# Patient Record
Sex: Male | Born: 1950 | Race: White | Hispanic: No | Marital: Married | Smoking: Never smoker
Health system: Southern US, Community
[De-identification: ages and names within clinical notes are randomized; demographics above are authoritative.]

## PROBLEM LIST (undated history)

## (undated) DIAGNOSIS — Z860101 Personal history of adenomatous and serrated colon polyps: Secondary | ICD-10-CM

## (undated) DIAGNOSIS — D696 Thrombocytopenia, unspecified: Secondary | ICD-10-CM

## (undated) DIAGNOSIS — I10 Essential (primary) hypertension: Secondary | ICD-10-CM

## (undated) DIAGNOSIS — T7840XA Allergy, unspecified, initial encounter: Secondary | ICD-10-CM

## (undated) DIAGNOSIS — Z9289 Personal history of other medical treatment: Secondary | ICD-10-CM

## (undated) DIAGNOSIS — H269 Unspecified cataract: Secondary | ICD-10-CM

## (undated) DIAGNOSIS — R200 Anesthesia of skin: Secondary | ICD-10-CM

## (undated) DIAGNOSIS — Z8673 Personal history of transient ischemic attack (TIA), and cerebral infarction without residual deficits: Secondary | ICD-10-CM

## (undated) DIAGNOSIS — I219 Acute myocardial infarction, unspecified: Secondary | ICD-10-CM

## (undated) DIAGNOSIS — M1990 Unspecified osteoarthritis, unspecified site: Secondary | ICD-10-CM

## (undated) DIAGNOSIS — I2510 Atherosclerotic heart disease of native coronary artery without angina pectoris: Secondary | ICD-10-CM

## (undated) DIAGNOSIS — N390 Urinary tract infection, site not specified: Secondary | ICD-10-CM

## (undated) DIAGNOSIS — Z86010 Personal history of colonic polyps: Secondary | ICD-10-CM

## (undated) DIAGNOSIS — F1010 Alcohol abuse, uncomplicated: Secondary | ICD-10-CM

## (undated) DIAGNOSIS — I452 Bifascicular block: Secondary | ICD-10-CM

## (undated) DIAGNOSIS — L719 Rosacea, unspecified: Secondary | ICD-10-CM

## (undated) DIAGNOSIS — K219 Gastro-esophageal reflux disease without esophagitis: Secondary | ICD-10-CM

## (undated) DIAGNOSIS — E119 Type 2 diabetes mellitus without complications: Secondary | ICD-10-CM

## (undated) DIAGNOSIS — F411 Generalized anxiety disorder: Secondary | ICD-10-CM

## (undated) DIAGNOSIS — R202 Paresthesia of skin: Secondary | ICD-10-CM

## (undated) DIAGNOSIS — N189 Chronic kidney disease, unspecified: Secondary | ICD-10-CM

## (undated) DIAGNOSIS — M545 Low back pain: Secondary | ICD-10-CM

## (undated) DIAGNOSIS — R569 Unspecified convulsions: Secondary | ICD-10-CM

## (undated) DIAGNOSIS — E785 Hyperlipidemia, unspecified: Secondary | ICD-10-CM

## (undated) HISTORY — PX: HERNIA REPAIR: SHX51

## (undated) HISTORY — DX: Personal history of other medical treatment: Z92.89

## (undated) HISTORY — DX: Essential (primary) hypertension: I10

## (undated) HISTORY — DX: Alcohol abuse, uncomplicated: F10.10

## (undated) HISTORY — DX: Chronic kidney disease, unspecified: N18.9

## (undated) HISTORY — DX: Personal history of colonic polyps: Z86.010

## (undated) HISTORY — DX: Thrombocytopenia, unspecified: D69.6

## (undated) HISTORY — DX: Bifascicular block: I45.2

## (undated) HISTORY — DX: Rosacea, unspecified: L71.9

## (undated) HISTORY — DX: Personal history of adenomatous and serrated colon polyps: Z86.0101

## (undated) HISTORY — DX: Unspecified cataract: H26.9

## (undated) HISTORY — DX: Acute myocardial infarction, unspecified: I21.9

## (undated) HISTORY — DX: Hyperlipidemia, unspecified: E78.5

## (undated) HISTORY — DX: Type 2 diabetes mellitus without complications: E11.9

## (undated) HISTORY — DX: Unspecified osteoarthritis, unspecified site: M19.90

## (undated) HISTORY — PX: CORONARY STENT PLACEMENT: SHX1402

## (undated) HISTORY — DX: Atherosclerotic heart disease of native coronary artery without angina pectoris: I25.10

## (undated) HISTORY — PX: MEDIAL PARTIAL KNEE REPLACEMENT: SHX5965

## (undated) HISTORY — PX: KNEE SURGERY: SHX244

## (undated) HISTORY — DX: Generalized anxiety disorder: F41.1

## (undated) HISTORY — DX: Allergy, unspecified, initial encounter: T78.40XA

## (undated) HISTORY — DX: Unspecified convulsions: R56.9

## (undated) HISTORY — DX: Personal history of transient ischemic attack (TIA), and cerebral infarction without residual deficits: Z86.73

## (undated) HISTORY — DX: Gastro-esophageal reflux disease without esophagitis: K21.9

## (undated) HISTORY — DX: Low back pain: M54.5

---

## 2004-05-18 ENCOUNTER — Ambulatory Visit: Payer: Self-pay | Admitting: Internal Medicine

## 2004-05-20 ENCOUNTER — Encounter: Admission: RE | Admit: 2004-05-20 | Discharge: 2004-05-20 | Payer: Self-pay | Admitting: Internal Medicine

## 2004-07-14 ENCOUNTER — Ambulatory Visit: Payer: Self-pay | Admitting: Internal Medicine

## 2004-11-07 ENCOUNTER — Ambulatory Visit: Payer: Self-pay | Admitting: Internal Medicine

## 2004-12-15 ENCOUNTER — Ambulatory Visit: Payer: Self-pay | Admitting: Internal Medicine

## 2005-05-15 ENCOUNTER — Ambulatory Visit: Payer: Self-pay | Admitting: Internal Medicine

## 2005-05-28 ENCOUNTER — Ambulatory Visit: Payer: Self-pay | Admitting: Internal Medicine

## 2005-07-04 ENCOUNTER — Ambulatory Visit: Payer: Self-pay | Admitting: Internal Medicine

## 2005-07-09 ENCOUNTER — Ambulatory Visit: Payer: Self-pay | Admitting: Internal Medicine

## 2005-08-01 ENCOUNTER — Ambulatory Visit: Payer: Self-pay | Admitting: Internal Medicine

## 2005-09-17 ENCOUNTER — Encounter: Payer: Self-pay | Admitting: Internal Medicine

## 2005-09-17 ENCOUNTER — Ambulatory Visit: Payer: Self-pay

## 2005-09-17 ENCOUNTER — Encounter: Payer: Self-pay | Admitting: Cardiology

## 2005-09-20 ENCOUNTER — Ambulatory Visit: Payer: Self-pay

## 2005-09-20 ENCOUNTER — Encounter: Payer: Self-pay | Admitting: Internal Medicine

## 2005-10-15 ENCOUNTER — Ambulatory Visit: Payer: Self-pay | Admitting: Internal Medicine

## 2006-01-09 ENCOUNTER — Ambulatory Visit: Payer: Self-pay | Admitting: Internal Medicine

## 2006-05-06 ENCOUNTER — Ambulatory Visit: Payer: Self-pay | Admitting: Internal Medicine

## 2006-07-10 ENCOUNTER — Ambulatory Visit: Payer: Self-pay | Admitting: Internal Medicine

## 2006-09-18 ENCOUNTER — Encounter: Payer: Self-pay | Admitting: Internal Medicine

## 2006-09-18 DIAGNOSIS — K219 Gastro-esophageal reflux disease without esophagitis: Secondary | ICD-10-CM | POA: Insufficient documentation

## 2006-09-18 DIAGNOSIS — M545 Low back pain, unspecified: Secondary | ICD-10-CM | POA: Insufficient documentation

## 2006-09-18 DIAGNOSIS — I10 Essential (primary) hypertension: Secondary | ICD-10-CM

## 2006-09-18 DIAGNOSIS — R569 Unspecified convulsions: Secondary | ICD-10-CM | POA: Insufficient documentation

## 2006-09-18 HISTORY — DX: Unspecified convulsions: R56.9

## 2006-09-18 HISTORY — DX: Essential (primary) hypertension: I10

## 2006-09-18 HISTORY — DX: Low back pain, unspecified: M54.50

## 2006-09-18 HISTORY — DX: Gastro-esophageal reflux disease without esophagitis: K21.9

## 2006-10-21 ENCOUNTER — Telehealth: Payer: Self-pay | Admitting: Internal Medicine

## 2006-12-16 ENCOUNTER — Encounter: Payer: Self-pay | Admitting: Internal Medicine

## 2007-01-08 ENCOUNTER — Ambulatory Visit: Payer: Self-pay | Admitting: Internal Medicine

## 2007-01-08 DIAGNOSIS — Z860100 Personal history of colon polyps, unspecified: Secondary | ICD-10-CM | POA: Insufficient documentation

## 2007-01-20 ENCOUNTER — Ambulatory Visit: Payer: Self-pay | Admitting: Gastroenterology

## 2007-02-03 ENCOUNTER — Encounter: Payer: Self-pay | Admitting: Gastroenterology

## 2007-02-03 ENCOUNTER — Ambulatory Visit: Payer: Self-pay | Admitting: Gastroenterology

## 2007-02-03 ENCOUNTER — Encounter (INDEPENDENT_AMBULATORY_CARE_PROVIDER_SITE_OTHER): Payer: Self-pay | Admitting: *Deleted

## 2007-04-07 ENCOUNTER — Ambulatory Visit: Payer: Self-pay | Admitting: Internal Medicine

## 2007-04-07 DIAGNOSIS — M25569 Pain in unspecified knee: Secondary | ICD-10-CM

## 2007-04-08 ENCOUNTER — Telehealth (INDEPENDENT_AMBULATORY_CARE_PROVIDER_SITE_OTHER): Payer: Self-pay | Admitting: *Deleted

## 2007-04-11 ENCOUNTER — Telehealth: Payer: Self-pay | Admitting: Internal Medicine

## 2007-05-01 ENCOUNTER — Telehealth (INDEPENDENT_AMBULATORY_CARE_PROVIDER_SITE_OTHER): Payer: Self-pay | Admitting: *Deleted

## 2007-05-01 ENCOUNTER — Telehealth: Payer: Self-pay | Admitting: Internal Medicine

## 2007-05-01 ENCOUNTER — Encounter: Payer: Self-pay | Admitting: Internal Medicine

## 2007-07-09 ENCOUNTER — Ambulatory Visit: Payer: Self-pay | Admitting: Internal Medicine

## 2007-07-09 LAB — CONVERTED CEMR LAB
BUN: 9 mg/dL (ref 6–23)
Basophils Absolute: 0.1 10*3/uL (ref 0.0–0.1)
Bilirubin, Direct: 0.1 mg/dL (ref 0.0–0.3)
Direct LDL: 163.3 mg/dL
GFR calc non Af Amer: 93 mL/min
HDL: 36.4 mg/dL — ABNORMAL LOW (ref >39.0)
Lymphocytes Relative: 31.2 % (ref 12.0–46.0)
Monocytes Relative: 11.8 % (ref 3.0–12.0)
Neutro Abs: 2 10*3/uL (ref 1.4–7.7)
Neutrophils Relative %: 50.7 % (ref 43.0–77.0)
PSA: 2.29 ng/mL (ref 0.10–4.00)
Platelets: 144 10*3/uL — ABNORMAL LOW (ref 150–400)
RDW: 12.7 % (ref 11.5–14.6)
Sodium: 145 meq/L (ref 135–145)
TSH: 3.68 microintl units/mL (ref 0.35–5.50)
Total CHOL/HDL Ratio: 5.9
Total Protein: 6.9 g/dL (ref 6.0–8.3)
VLDL: 24 mg/dL (ref 0–40)
WBC: 4 10*3/uL — ABNORMAL LOW (ref 4.5–10.5)

## 2007-12-31 ENCOUNTER — Telehealth: Payer: Self-pay | Admitting: Internal Medicine

## 2008-01-08 ENCOUNTER — Ambulatory Visit: Payer: Self-pay | Admitting: Internal Medicine

## 2008-01-09 LAB — CONVERTED CEMR LAB
ALT: 112 units/L — ABNORMAL HIGH (ref 0–53)
AST: 183 units/L — ABNORMAL HIGH (ref 0–37)
Albumin: 3.3 g/dL — ABNORMAL LOW (ref 3.5–5.2)
Bilirubin, Direct: 0.2 mg/dL (ref 0.0–0.3)
Total Bilirubin: 0.8 mg/dL (ref 0.3–1.2)

## 2008-02-17 ENCOUNTER — Telehealth: Payer: Self-pay | Admitting: Internal Medicine

## 2008-03-05 DIAGNOSIS — I219 Acute myocardial infarction, unspecified: Secondary | ICD-10-CM

## 2008-03-05 HISTORY — DX: Acute myocardial infarction, unspecified: I21.9

## 2008-06-09 ENCOUNTER — Telehealth: Payer: Self-pay | Admitting: Internal Medicine

## 2008-07-03 DIAGNOSIS — Z8673 Personal history of transient ischemic attack (TIA), and cerebral infarction without residual deficits: Secondary | ICD-10-CM

## 2008-07-03 HISTORY — DX: Personal history of transient ischemic attack (TIA), and cerebral infarction without residual deficits: Z86.73

## 2008-07-14 ENCOUNTER — Ambulatory Visit: Payer: Self-pay | Admitting: Vascular Surgery

## 2008-07-14 ENCOUNTER — Inpatient Hospital Stay (HOSPITAL_COMMUNITY): Admission: EM | Admit: 2008-07-14 | Discharge: 2008-07-16 | Payer: Self-pay | Admitting: Emergency Medicine

## 2008-07-14 ENCOUNTER — Ambulatory Visit: Payer: Self-pay | Admitting: Internal Medicine

## 2008-07-15 ENCOUNTER — Encounter: Payer: Self-pay | Admitting: Cardiovascular Disease

## 2008-07-15 ENCOUNTER — Encounter: Payer: Self-pay | Admitting: Internal Medicine

## 2008-07-16 ENCOUNTER — Other Ambulatory Visit: Payer: Self-pay | Admitting: Cardiovascular Disease

## 2008-07-16 ENCOUNTER — Encounter: Payer: Self-pay | Admitting: Internal Medicine

## 2008-07-20 ENCOUNTER — Encounter: Payer: Self-pay | Admitting: Internal Medicine

## 2008-07-27 DIAGNOSIS — D696 Thrombocytopenia, unspecified: Secondary | ICD-10-CM

## 2008-07-27 DIAGNOSIS — F411 Generalized anxiety disorder: Secondary | ICD-10-CM

## 2008-07-27 DIAGNOSIS — I2510 Atherosclerotic heart disease of native coronary artery without angina pectoris: Secondary | ICD-10-CM

## 2008-07-27 DIAGNOSIS — L719 Rosacea, unspecified: Secondary | ICD-10-CM

## 2008-07-27 DIAGNOSIS — F1010 Alcohol abuse, uncomplicated: Secondary | ICD-10-CM | POA: Insufficient documentation

## 2008-07-27 DIAGNOSIS — R209 Unspecified disturbances of skin sensation: Secondary | ICD-10-CM

## 2008-07-27 HISTORY — DX: Alcohol abuse, uncomplicated: F10.10

## 2008-07-27 HISTORY — DX: Generalized anxiety disorder: F41.1

## 2008-07-27 HISTORY — DX: Rosacea, unspecified: L71.9

## 2008-07-27 HISTORY — DX: Atherosclerotic heart disease of native coronary artery without angina pectoris: I25.10

## 2008-07-27 HISTORY — DX: Thrombocytopenia, unspecified: D69.6

## 2008-07-28 ENCOUNTER — Encounter: Payer: Self-pay | Admitting: Nurse Practitioner

## 2008-07-28 ENCOUNTER — Ambulatory Visit: Payer: Self-pay | Admitting: Internal Medicine

## 2008-07-28 ENCOUNTER — Observation Stay (HOSPITAL_COMMUNITY): Admission: AD | Admit: 2008-07-28 | Discharge: 2008-07-29 | Payer: Self-pay | Admitting: Internal Medicine

## 2008-07-28 ENCOUNTER — Encounter: Payer: Self-pay | Admitting: Cardiology

## 2008-07-29 ENCOUNTER — Encounter: Payer: Self-pay | Admitting: Internal Medicine

## 2008-07-30 ENCOUNTER — Telehealth: Payer: Self-pay | Admitting: Internal Medicine

## 2008-08-05 ENCOUNTER — Ambulatory Visit: Payer: Self-pay | Admitting: Internal Medicine

## 2008-08-05 DIAGNOSIS — R5383 Other fatigue: Secondary | ICD-10-CM

## 2008-08-05 DIAGNOSIS — R5381 Other malaise: Secondary | ICD-10-CM | POA: Insufficient documentation

## 2008-08-05 DIAGNOSIS — E785 Hyperlipidemia, unspecified: Secondary | ICD-10-CM | POA: Insufficient documentation

## 2008-08-22 ENCOUNTER — Encounter: Payer: Self-pay | Admitting: Cardiovascular Disease

## 2008-10-09 ENCOUNTER — Encounter: Admission: RE | Admit: 2008-10-09 | Discharge: 2008-10-09 | Payer: Self-pay | Admitting: Orthopedic Surgery

## 2008-12-15 ENCOUNTER — Ambulatory Visit: Payer: Self-pay | Admitting: Internal Medicine

## 2008-12-15 DIAGNOSIS — I452 Bifascicular block: Secondary | ICD-10-CM | POA: Insufficient documentation

## 2009-06-07 ENCOUNTER — Ambulatory Visit: Payer: Self-pay | Admitting: Internal Medicine

## 2009-07-14 ENCOUNTER — Ambulatory Visit: Payer: Self-pay | Admitting: Internal Medicine

## 2009-07-14 LAB — CONVERTED CEMR LAB
ALT: 25 units/L (ref 0–53)
AST: 30 units/L (ref 0–37)
BUN: 20 mg/dL (ref 6–23)
Basophils Absolute: 0 10*3/uL (ref 0.0–0.1)
Chloride: 109 meq/L (ref 96–112)
Cholesterol: 147 mg/dL (ref 0–200)
Eosinophils Absolute: 0.3 10*3/uL (ref 0.0–0.7)
Glucose, Bld: 79 mg/dL (ref 70–99)
HCT: 43.1 % (ref 39.0–52.0)
HDL: 44.5 mg/dL (ref >39.00)
LDL Cholesterol: 89 mg/dL (ref 0–99)
Lymphocytes Relative: 27.4 % (ref 12.0–46.0)
MCV: 97.7 fL (ref 78.0–100.0)
Monocytes Absolute: 0.6 10*3/uL (ref 0.1–1.0)
Neutrophils Relative %: 53.6 % (ref 43.0–77.0)
PSA: 1.94 ng/mL (ref 0.10–4.00)
Potassium: 4.7 meq/L (ref 3.5–5.1)
RBC: 4.41 M/uL (ref 4.22–5.81)
Total Protein: 6 g/dL (ref 6.0–8.3)
Triglycerides: 68 mg/dL (ref 0.0–149.0)
VLDL: 13.6 mg/dL (ref 0.0–40.0)

## 2009-07-25 ENCOUNTER — Ambulatory Visit: Payer: Self-pay | Admitting: Internal Medicine

## 2010-01-09 ENCOUNTER — Ambulatory Visit: Payer: Self-pay | Admitting: Internal Medicine

## 2010-01-24 ENCOUNTER — Encounter: Payer: Self-pay | Admitting: Gastroenterology

## 2010-01-30 ENCOUNTER — Ambulatory Visit: Payer: Self-pay | Admitting: Internal Medicine

## 2010-03-12 IMAGING — CR DG CHEST 2V
2 series · 2 of 2 positions shown · non-contrast
Comparison: 07/14/2008

CLINICAL DATA: Chest pain

CHEST - 2 VIEW

[w chest pa]
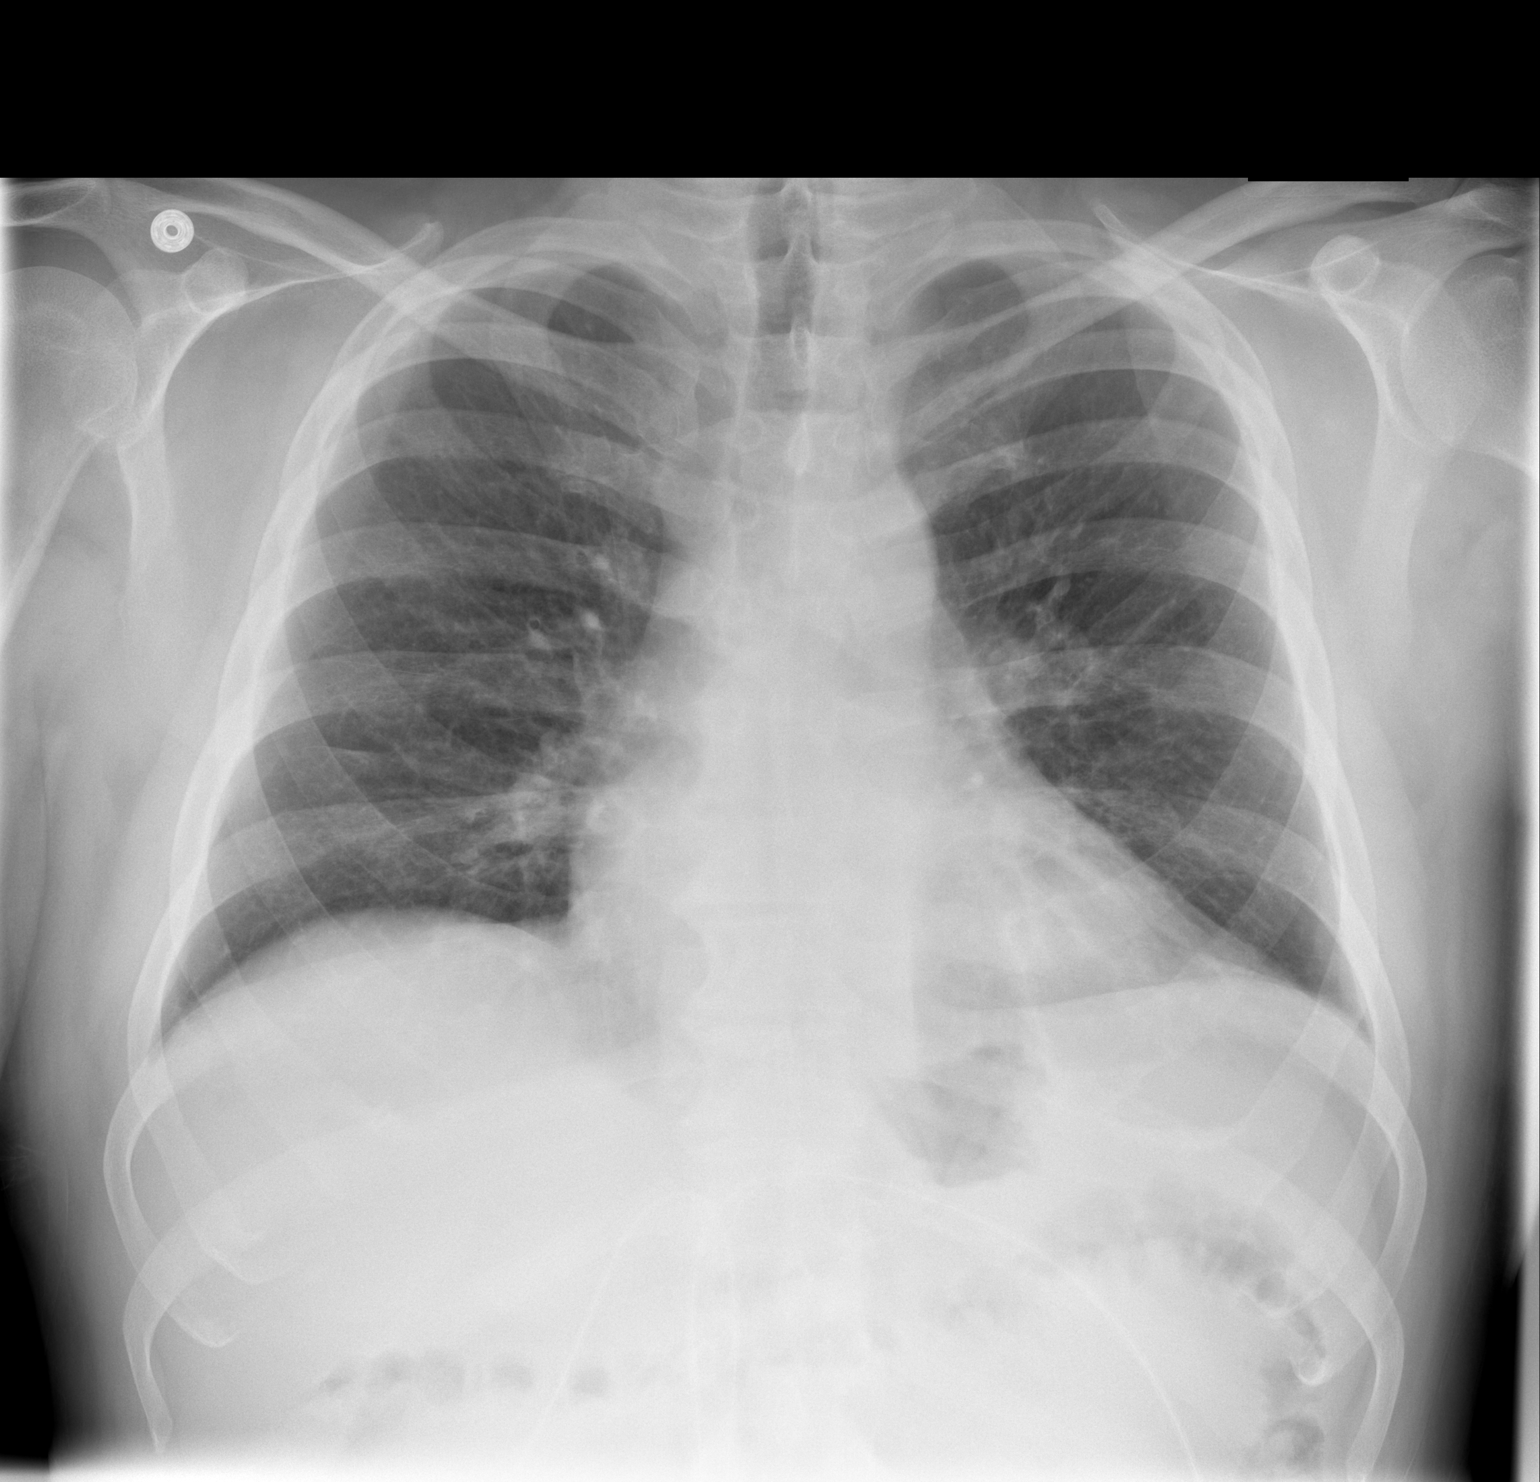

[w chest lat]
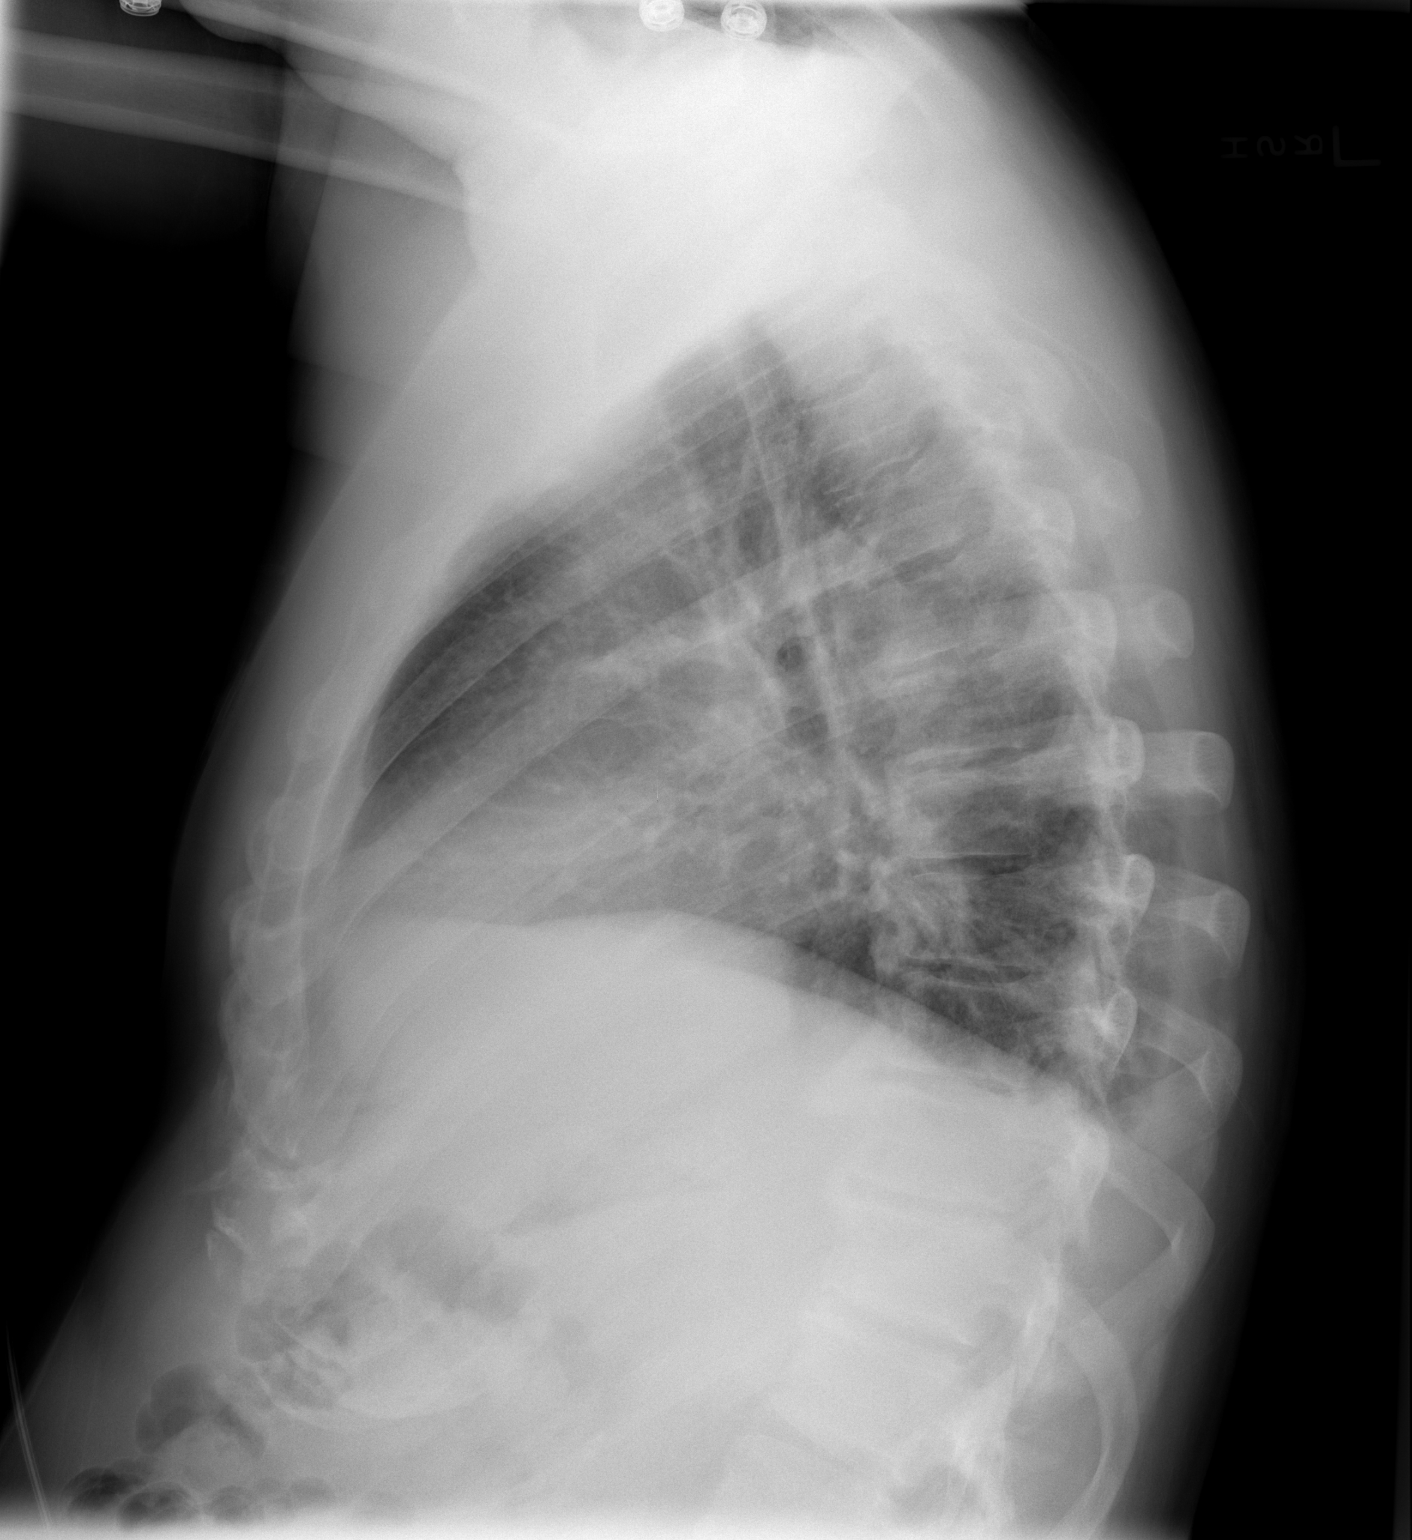

[2 of 2 positions shown; findings below may reference images not displayed]

FINDINGS: The lungs are clear.  The heart and mediastinal contours
are normal.  Thoracic spine degenerative changes.
IMPRESSION: No acute cardiopulmonary process.

## 2010-04-04 NOTE — Assessment & Plan Note (Signed)
Summary: 6 month roa//lch   Vital Signs:  Patient profile:   60 year old male Weight:      200 pounds Temp:     98.0 degrees F oral BP sitting:   102 / 64  (right arm) Cuff size:   regular  Vitals Entered By: Duard Brady LPN (January 30, 2010 10:33 AM) CC: 6 mos rov - doing well Is Patient Diabetic? No CBG Result 89   Primary Care Provider:  Gordy Savers  MD  CC:  6 mos rov - doing well.  History of Present Illness:  60 year old patient who is followed closely by cardiology for coronary artery disease.  Medical regimen includes metoprolol 50 mg b.i.d. he complains of the fatigue diminished libido.  He has some monitoring home blood pressure and pulses with often the low blood pressure readings and pulse readings in the 40s.  His cardiac status has been stable and remains on aspirin and Plavix.  He has a history of alcohol abuse, but has been abstinent for some time.  He has been on gabapentin due to a seizure disorder.  He has treated hypertension, and dyslipidemia.  He remains on statin therapy.  He does have a history of impaired glucose tolerance and random blood sugar today 89.  His weight is up modestly.  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  He is followed by [facility] orthopedics  Allergies (verified): No Known Drug Allergies  Past History:  Past Medical History: Reviewed history from 07/28/2008 and no changes required. FACIAL PARESTHESIA, RIGHT (ICD-782.0) CAD (ICD-414.00)      A.  S/P ENDEAVOR DES TO LCX, 07/15/2008.  RESIDUAL OM2 DZS. HYPERTENSION (ICD-401.9) GERD (ICD-530.81) THROMBOCYTOPENIA (ICD-287.5) GLUCOSE INTOLERANCE (ICD-271.3) ANXIETY (ICD-300.00) ALCOHOL ABUSE (ICD-305.00) COLONIC POLYPS, HX OF (ICD-V12.72) SPECIAL SCREENING MALIGNANT NEOPLASM OF PROSTATE (ICD-V76.44) KNEE PAIN, ACUTE (ICD-719.46) COLONIC POLYPS, ADENOMATOUS, HX OF (ICD-V12.72) SEIZURE DISORDER  (ICD-780.39) LOW BACK PAIN (ICD-724.2) ROSACEA (ICD-695.3)  Family History: Reviewed history from 07/25/2009 and no changes required. Mother deceased age 19  of lung cancer and diabetes   mellitus.  Father deceased age 52 with emphysema.  One sister with   hypertension.  The patient's uncle on his mother's sided died in his 75s   or 73s from a massive MI.  Two other MIs on mother's side.      Social History: Reviewed history from 07/25/2009 and no changes required.  The patient lives in [place] with his wife.  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  He has no tobacco abuse history- chews tobacco   He drinks 2 to 3 drinks a day per week with binge drinking on the   weekends occasionally.  No illicit drug use.  Only herbal medication is   vitamin C.  He has a regular diet.  He does not regular exercise.      Review of Systems       The patient complains of weight gain, muscle weakness, and difficulty walking.  The patient denies anorexia, fever, weight loss, vision loss, decreased hearing, hoarseness, chest pain, syncope, dyspnea on exertion, peripheral edema, prolonged cough, headaches, hemoptysis, abdominal pain, melena, hematochezia, severe indigestion/heartburn, hematuria, incontinence, genital sores, suspicious skin lesions, transient blindness, depression, unusual weight change, abnormal bleeding, enlarged lymph nodes, angioedema, breast masses, and testicular masses.    Physical Exam  General:  overweight-appearing.blood pressure 100/64overweight-appearing.  Head:  Normocephalic and atraumatic without obvious abnormalities. No apparent alopecia or balding. Mouth:  Oral mucosa and oropharynx without lesions or exudates.  Teeth in good repair. Neck:  No deformities, masses, or tenderness noted. Lungs:  Normal respiratory effort, chest expands symmetrically. Lungs are clear to auscultation, no  crackles or wheezes. Heart:  Normal rate and regular rhythm. S1 and S2 normal without gallop, murmur, click, rub or other extra sounds. Abdomen:  Bowel sounds positive,abdomen soft and non-tender without masses, organomegaly or hernias noted. Msk:  No deformity or scoliosis noted of thoracic or lumbar spine.   Pulses:  R and L carotid,radial,femoral,dorsalis pedis and posterior tibial pulses are full and equal bilaterally Extremities:  No clubbing, cyanosis, edema, or deformity noted with normal full range of motion of all joints.     Impression & Recommendations:  Problem # 1:  FATIGUE / MALAISE (ICD-780.79) will decrease metoprolol to 25 mg b.i.d.  Problem # 2:  SEIZURE DISORDER (ICD-780.39)  His updated medication list for this problem includes:    Gabapentin 600 Mg Tabs (Gabapentin) .Marland Kitchen... Take 1 tablet by mouth three times a day will follow up neurology for consideration alternative AED therapy or possible trial of discontinuation since he has been alcohol free  Orders: Neurology Referral (Neuro)  His updated medication list for this problem includes:    Gabapentin 600 Mg Tabs (Gabapentin) .Marland Kitchen... Take 1 tablet by mouth three times a day  Problem # 3:  HYPERLIPIDEMIA (ICD-272.4)  His updated medication list for this problem includes:    Pravastatin Sodium 20 Mg Tabs (Pravastatin sodium) .Marland Kitchen... Take one tablet by mouth daily at bedtime  His updated medication list for this problem includes:    Pravastatin Sodium 20 Mg Tabs (Pravastatin sodium) .Marland Kitchen... Take one tablet by mouth daily at bedtime  Problem # 4:  HYPERTENSION (ICD-401.9)  His updated medication list for this problem includes:    Benazepril Hcl 40 Mg Tabs (Benazepril hcl) ..... One daily    Metoprolol Tartrate 50 Mg Tabs (Metoprolol tartrate) .Marland Kitchen... Take one-half  tablet by mouth twice a day  His updated medication list for this problem includes:    Benazepril Hcl 40 Mg Tabs (Benazepril hcl) ..... One daily     Metoprolol Tartrate 50 Mg Tabs (Metoprolol tartrate) .Marland Kitchen... Take one-half  tablet by mouth twice a day  Complete Medication List: 1)  Gabapentin 600 Mg Tabs (Gabapentin) .... Take 1 tablet by mouth three times a day 2)  Alprazolam 0.5 Mg Tabs (Alprazolam) .... One three times daily as needed 3)  Finacea 15 % Gel (Azelaic acid) .... Uad 4)  Aspirin 325 Mg Tabs (Aspirin) .... Daily 5)  Halobetasol Propionate 0.05 % Crea (Halobetasol propionate) .... Uad 6)  Prevacid 15 Mg Cpdr (Lansoprazole) .... Daily 7)  Benazepril Hcl 40 Mg Tabs (Benazepril hcl) .... One daily 8)  Plavix 75 Mg Tabs (Clopidogrel bisulfate) .Marland Kitchen.. 1 by mouth daily 9)  Metoprolol Tartrate 50 Mg Tabs (Metoprolol tartrate) .... Take one-half  tablet by mouth twice a day 10)  Nitrostat 0.4 Mg Subl (Nitroglycerin) .... As needed 11)  Pravastatin Sodium 20 Mg Tabs (Pravastatin sodium) .... Take one tablet by mouth daily at bedtime  Other Orders: Capillary Blood Glucose/CBG (82956)  Patient Instructions: 1)  Please schedule a follow-up appointment in 6 months for CPX  2)  Limit your Sodium (Salt). 3)  It is important that you exercise regularly at least 20 minutes 5 times a week. If you develop chest pain, have severe  difficulty breathing, or feel very tired , stop exercising immediately and seek medical attention. 4)  You need to lose weight. Consider a lower calorie diet and regular exercise.  Prescriptions: PRAVASTATIN SODIUM 20 MG TABS (PRAVASTATIN SODIUM) Take one tablet by mouth daily at bedtime  #90 x 5   Entered and Authorized by:   Gordy Savers  MD   Signed by:   Gordy Savers  MD on 01/30/2010   Method used:   Print then Give to Patient   RxID:   [id number] METOPROLOL TARTRATE 50 MG TABS (METOPROLOL TARTRATE) Take one-half  tablet by mouth twice a day  #180 x 6   Entered and Authorized by:   Gordy Savers  MD   Signed by:   Gordy Savers  MD on 01/30/2010   Method used:   Print then Give  to Patient   RxID:   [id number] PLAVIX 75 MG TABS (CLOPIDOGREL BISULFATE) 1 by mouth daily  #30 x 12   Entered and Authorized by:   Gordy Savers  MD   Signed by:   Gordy Savers  MD on 01/30/2010   Method used:   Print then Give to Patient   RxID:   [id number] BENAZEPRIL HCL 40 MG TABS (BENAZEPRIL HCL) One daily  #90 x 12   Entered and Authorized by:   Gordy Savers  MD   Signed by:   Gordy Savers  MD on 01/30/2010   Method used:   Print then Give to Patient   RxID:   [id number] PREVACID 15 MG CPDR (LANSOPRAZOLE) daily  #90 x 3   Entered and Authorized by:   Gordy Savers  MD   Signed by:   Gordy Savers  MD on 01/30/2010   Method used:   Print then Give to Patient   RxID:   [id number] ALPRAZOLAM 0.5 MG  TABS (ALPRAZOLAM) one three times daily as needed  #90 x 3   Entered and Authorized by:   Gordy Savers  MD   Signed by:   Gordy Savers  MD on 01/30/2010   Method used:   Print then Give to Patient   RxID:   [id number] GABAPENTIN 600 MG TABS (GABAPENTIN) Take 1 tablet by mouth three times a day  #270 Tablet x 4   Entered and Authorized by:   Gordy Savers  MD   Signed by:   Gordy Savers  MD on 01/30/2010   Method used:   Print then Give to Patient   RxID:   [id number]    Orders Added: 1)  Capillary Blood Glucose/CBG [16073] 2)  Neurology Referral [Neuro] 3)  Est. Patient Level IV [71062]

## 2010-04-04 NOTE — Letter (Signed)
Summary: Colonoscopy Letter  [facility]  [address] [place], [place] [address]   Phone: [phone]  Fax: [phone]      January 24, 2010 MRN: [id number]   Jose [name] [address] [place], [place]  [address]   Dear Mr. Legner,   According to your medical record, it is time for you to schedule a Colonoscopy. The American Cancer Society recommends this procedure as a method to detect early colon cancer. Patients with a family history of colon cancer, or a personal history of colon polyps or inflammatory bowel disease are at increased risk.  This letter has been generated based on the recommendations made at the time of your procedure. If you feel that in your particular situation this may no longer apply, please contact our office.  Please call our office at [phone] to schedule this appointment or to update your records at your earliest convenience.  Thank you for cooperating with [place] to provide you with the very best care possible.   Sincerely,  Rachael Fee, M.D.  [facility] [phone]

## 2010-04-04 NOTE — Assessment & Plan Note (Signed)
Summary: flu shot//alp  Nurse Visit   Vital Signs:  Patient profile:   60 year old male Temp:     97.8 degrees F oral  Vitals Entered By: Duard Brady LPN (January 09, 2010 10:29 AM) Flu Vaccine Consent Questions     Do you have a history of severe allergic reactions to this vaccine? no    Any prior history of allergic reactions to egg and/or gelatin? no    Do you have a sensitivity to the preservative Thimersol? no    Do you have a past history of Guillan-Barre Syndrome? no    Do you currently have an acute febrile illness? no    Have you ever had a severe reaction to latex? no    Vaccine information given and explained to patient? yes    Are you currently pregnant? no    Lot Number:[id number]   Exp Date:09/02/2010   Site Given  Left Deltoid IM  Allergies: No Known Drug Allergies  Orders Added: 1)  Admin 1st Vaccine [90471] 2)  Flu Vaccine 12yrs + [62130]

## 2010-04-04 NOTE — Assessment & Plan Note (Signed)
Summary: cpx/cjr   Vital Signs:  Patient profile:   60 year old male Height:      67 inches Weight:      192 pounds Temp:     98.4 degrees F oral BP sitting:   160 / 80  (left arm) Cuff size:   regular  Vitals Entered By: Duard Brady LPN (Jul 25, 2009 2:44 PM) CC: cpx - doing well , has seen cardio  Is Patient Diabetic? No   Primary Care Provider:  Gordy Savers  MD  CC:  cpx - doing well  and has seen cardio .  History of Present Illness: 60 year old patient who is seen today for a wellness exam.  He is followed closely by cardiology for coronary artery disease.  He is status post DES to the left circumflex artery, approximately 1 year ago.  He has residual 70% OM  disease and an estimated ejection fraction of 45%.  He has hypertension, history of EtOH and dyslipidemia.  Preventive Screening-Counseling & Management  Alcohol-Tobacco     Smoking Status: never  Allergies (verified): No Known Drug Allergies  Past History:  Past Medical History: Reviewed history from 07/28/2008 and no changes required. FACIAL PARESTHESIA, RIGHT (ICD-782.0) CAD (ICD-414.00)      A.  S/P ENDEAVOR DES TO LCX, 07/15/2008.  RESIDUAL OM2 DZS. HYPERTENSION (ICD-401.9) GERD (ICD-530.81) THROMBOCYTOPENIA (ICD-287.5) GLUCOSE INTOLERANCE (ICD-271.3) ANXIETY (ICD-300.00) ALCOHOL ABUSE (ICD-305.00) COLONIC POLYPS, HX OF (ICD-V12.72) SPECIAL SCREENING MALIGNANT NEOPLASM OF PROSTATE (ICD-V76.44) KNEE PAIN, ACUTE (ICD-719.46) COLONIC POLYPS, ADENOMATOUS, HX OF (ICD-V12.72) SEIZURE DISORDER (ICD-780.39) LOW BACK PAIN (ICD-724.2) ROSACEA (ICD-695.3)  Past Surgical History: right knee arthroscopic surgery April 2009 colonoscopy  2003, 2009 Drug  eluting stent to the obtuse marginal-2 with an Endeavor drug- eluting stent.  Family History: Reviewed history from 07/27/2008 and no changes required. Mother deceased age 35  of lung cancer and diabetes   mellitus.  Father deceased age 10  with emphysema.  One sister with   hypertension.  The patient's uncle on his mother's sided died in his 57s   or 72s from a massive MI.  Two other MIs on mother's side.      Social History: Reviewed history from 07/27/2008 and no changes required.  The patient lives in [place] with his wife.  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  He has no tobacco abuse history- chews tobacco   He drinks 2 to 3 drinks a day per week with binge drinking on the   weekends occasionally.  No illicit drug use.  Only herbal medication is   vitamin C.  He has a regular diet.  He does not regular exercise.     Smoking Status:  never  Review of Systems  The patient denies anorexia, fever, weight loss, weight gain, vision loss, decreased hearing, hoarseness, chest pain, syncope, dyspnea on exertion, peripheral edema, prolonged cough, headaches, hemoptysis, abdominal pain, melena, hematochezia, severe indigestion/heartburn, hematuria, incontinence, genital sores, muscle weakness, suspicious skin lesions, transient blindness, difficulty walking, depression, unusual weight change, abnormal bleeding, enlarged lymph nodes, angioedema, breast masses, and testicular masses.         chronic right knee pain  Physical Exam  General:  mildly overweight, blood  pressure 130/80 Head:  Normocephalic and atraumatic without obvious abnormalities. No apparent alopecia or balding. Eyes:  No corneal or conjunctival inflammation noted. EOMI. Perrla. Funduscopic exam benign, without hemorrhages, exudates or  papilledema. Vision grossly normal. Ears:  External ear exam shows no significant lesions or deformities.  Otoscopic examination reveals clear canals, tympanic membranes are intact bilaterally without bulging, retraction, inflammation or discharge. Hearing is grossly normal bilaterally. Nose:  External nasal examination shows no deformity or  inflammation. Nasal mucosa are pink and moist without lesions or exudates. Mouth:  Oral mucosa and oropharynx without lesions or exudates.  Teeth in good repair.low-lying uvula.  low-lying uvula.   Neck:  No deformities, masses, or tenderness noted. Chest Wall:  No deformities, masses, tenderness or gynecomastia noted. Breasts:  No masses or gynecomastia noted Lungs:  Normal respiratory effort, chest expands symmetrically. Lungs are clear to auscultation, no crackles or wheezes. Heart:  Normal rate and regular rhythm. S1 and S2 normal without gallop, murmur, click, rub or other extra sounds. Abdomen:  Bowel sounds positive,abdomen soft and non-tender without masses, organomegaly or hernias noted. Rectal:  No external abnormalities noted. Normal sphincter tone. No rectal masses or tenderness. Genitalia:  Testes bilaterally descended without nodularity, tenderness or masses. No scrotal masses or lesions. No penis lesions or urethral discharge. Prostate:  1+ enlarged.  1+ enlarged.   Msk:  No deformity or scoliosis noted of thoracic or lumbar spine.   Pulses:  R and L carotid,radial,femoral,dorsalis pedis and posterior tibial pulses are full and equal bilaterally Extremities:  No clubbing, cyanosis, edema, or deformity noted with normal full range of motion of all joints.   Neurologic:  No cranial nerve deficits noted. Station and gait are normal. Plantar reflexes are down-going bilaterally. DTRs are symmetrical throughout. Sensory, motor and coordinative functions appear intact. Skin:  Intact without suspicious lesions or rashes Cervical Nodes:  No lymphadenopathy noted Axillary Nodes:  No palpable lymphadenopathy Inguinal Nodes:  No significant adenopathy Psych:  Cognition and judgment appear intact. Alert and cooperative with normal attention span and concentration. No apparent delusions, illusions, hallucinations   Impression & Recommendations:  Problem # 1:  Preventive Health Care  (ICD-V70.0)  Complete Medication List: 1)  Gabapentin 600 Mg Tabs (Gabapentin) .... Take 1 tablet by mouth three times a day 2)  Alprazolam 0.5 Mg Tabs (Alprazolam) .... One three times daily as needed 3)  Finacea 15 % Gel (Azelaic acid) .... Uad 4)  Aspirin 325 Mg Tabs (Aspirin) .... Daily 5)  Halobetasol Propionate 0.05 % Crea (Halobetasol propionate) .... Uad 6)  Prevacid 15 Mg Cpdr (Lansoprazole) .... Daily 7)  Benazepril Hcl 40 Mg Tabs (Benazepril hcl) .... One daily 8)  Plavix 75 Mg Tabs (Clopidogrel bisulfate) .Marland Kitchen.. 1 by mouth daily 9)  Metoprolol Tartrate 50 Mg Tabs (Metoprolol tartrate) .... Take one tablet by mouth twice a day 10)  Nitrostat 0.4 Mg Subl (Nitroglycerin) .... As needed 11)  Pravastatin Sodium 20 Mg Tabs (Pravastatin sodium) .... Take one tablet by mouth daily at bedtime  Patient Instructions: 1)  Please schedule a follow-up appointment in 6 months. 2)  Limit your Sodium (Salt). 3)  It is important that you exercise regularly at least 20 minutes 5 times a week. If you develop chest pain, have severe difficulty breathing, or feel very tired , stop exercising immediately and seek medical attention. 4)  You need to lose weight. Consider a lower calorie diet and regular exercise.  Prescriptions: PRAVASTATIN SODIUM 20 MG TABS (PRAVASTATIN SODIUM) Take one tablet by mouth daily at bedtime  #90 x 6   Entered and Authorized by:   Gordy Savers  MD   Signed by:   Gordy Savers  MD on 07/25/2009   Method used:   Print then Give to Patient   RxID:   [id number] METOPROLOL TARTRATE 50 MG TABS (METOPROLOL TARTRATE) Take one tablet by mouth twice a day  #180 x 12   Entered and Authorized by:   Gordy Savers  MD   Signed by:   Gordy Savers  MD on 07/25/2009   Method used:   Print then Give to Patient   RxID:   [id number] PLAVIX 75 MG TABS (CLOPIDOGREL BISULFATE) 1 by mouth daily  #30 Tablet x 12   Entered and Authorized by:   Gordy Savers  MD   Signed by:   Gordy Savers  MD on 07/25/2009   Method used:   Print then Give to Patient   RxID:   [id number] BENAZEPRIL HCL 40 MG TABS (BENAZEPRIL HCL) One daily  #90 x 12   Entered and Authorized by:   Gordy Savers  MD   Signed by:   Gordy Savers  MD on 07/25/2009   Method used:   Print then Give to Patient   RxID:   [id number] PREVACID 15 MG CPDR (LANSOPRAZOLE) daily  #90 x 3   Entered and Authorized by:   Gordy Savers  MD   Signed by:   Gordy Savers  MD on 07/25/2009   Method used:   Print then Give to Patient   RxID:   [id number] ALPRAZOLAM 0.5 MG  TABS (ALPRAZOLAM) one three times daily as needed  #90 x 3   Entered and Authorized by:   Gordy Savers  MD   Signed by:   Gordy Savers  MD on 07/25/2009   Method used:   Print then Give to Patient   RxID:   [id number] GABAPENTIN 600 MG TABS (GABAPENTIN) Take 1 tablet by mouth three times a day  #270 Tablet x 4   Entered and Authorized by:   Gordy Savers  MD   Signed by:   Gordy Savers  MD on 07/25/2009   Method used:   Print then Give to Patient   RxID:   [id number]

## 2010-04-04 NOTE — Assessment & Plan Note (Signed)
Summary: f21m   Visit Type:  Follow-up Primary Provider:  Gordy Savers  MD  CC:  SOB on occasion.  History of Present Illness: Jose [name] is a 60 y/o male with h/o HTN, GERD, ETOH abuse and coronary artery disease. He is s/p recent drug-eluting stenting of the LCX on 07/16/2008.  He was seen in clinic in may 2010 doing relatively well but ECG markedly different from previous so admitted for cath. Cath showed widely patent LCX stent with residual 70% OM-1 lesion. EF 45%.   Returns today for f/u.  Overall feels well. Continues to have problem with R knee. Had knees surgery but keeps getting recurrent effusions. Denies any significant CP. Walks 40 mins at a time when knee permits. Under a lot of stress as stepdad dying from pancreatic CA.  Does get some dyspnea with walking up steps. Compliant with medications.    Says he has cut down on ETOH signifcantly. +chewing tobacco. BP well controlled on home cuff typically SBP 115-140. Fatigued. Snores.     Current Medications (verified): 1)  Gabapentin 600 Mg Tabs (Gabapentin) .... Take 1 Tablet By Mouth Three Times A Day 2)  Alprazolam 0.5 Mg  Tabs (Alprazolam) .... One Three Times Daily As Needed 3)  Finacea 15 %  Gel (Azelaic Acid) .... Uad 4)  Aspirin 325 Mg  Tabs (Aspirin) .... Daily 5)  Halobetasol Propionate 0.05 % Crea (Halobetasol Propionate) .... Uad 6)  Prevacid 15 Mg Cpdr (Lansoprazole) .... Daily 7)  Benazepril Hcl 40 Mg Tabs (Benazepril Hcl) .... One Daily 8)  Plavix 75 Mg Tabs (Clopidogrel Bisulfate) .Marland Kitchen.. 1 By Mouth Daily 9)  Metoprolol Tartrate 50 Mg Tabs (Metoprolol Tartrate) .... Take One Tablet By Mouth Twice A Day 10)  Nitrostat 0.4 Mg Subl (Nitroglycerin) .... As Needed  Allergies (verified): No Known Drug Allergies  Past History:  Past Medical History: Last updated: 07/28/2008 FACIAL PARESTHESIA, RIGHT (ICD-782.0) CAD (ICD-414.00)      A.  S/P ENDEAVOR DES TO LCX, 07/15/2008.  RESIDUAL OM2 DZS. HYPERTENSION  (ICD-401.9) GERD (ICD-530.81) THROMBOCYTOPENIA (ICD-287.5) GLUCOSE INTOLERANCE (ICD-271.3) ANXIETY (ICD-300.00) ALCOHOL ABUSE (ICD-305.00) COLONIC POLYPS, HX OF (ICD-V12.72) SPECIAL SCREENING MALIGNANT NEOPLASM OF PROSTATE (ICD-V76.44) KNEE PAIN, ACUTE (ICD-719.46) COLONIC POLYPS, ADENOMATOUS, HX OF (ICD-V12.72) SEIZURE DISORDER (ICD-780.39) LOW BACK PAIN (ICD-724.2) ROSACEA (ICD-695.3)  Review of Systems       As per HPI and past medical history; otherwise all systems negative.   Vital Signs:  Patient profile:   60 year old male Height:      67 inches Weight:      194 pounds BMI:     30.49 Pulse rate:   55 / minute BP sitting:   144 / 80  (left arm) Cuff size:   regular  Vitals Entered By: Hardin Negus, RMA (June 07, 2009 4:38 PM)  Physical Exam  General:  no acute ditress. no resp difficulty. poor eve contact. HEENT: normal. +rosacea Neck: supple. no JVD. Carotids 2+ bilat; no bruits. No lymphadenopathy or thryomegaly appreciated. Cor: PMI nondisplaced. Regular rate & rhythm. No rubs, murmur. +s4 Lungs: clear Abdomen: soft, nontender, nondistended. No hepatosplenomegaly. No bruits or masses. Good bowel sounds. large healing ecchymosis R flank Extremities: no cyanosis, clubbing, rash, edema Neuro: alert & orientedx3, cranial nerves grossly intact. moves all 4 extremities w/o difficulty. affect flat    Impression & Recommendations:  Problem # 1:  CAD (ICD-414.00) Stable. No evidence of ischemia. Continue current regimen.  Problem # 2:  HYPERLIPIDEMIA (ICD-272.4) Now that  he is not drinking ETOH as much will restart statin with prava 20. Will f.u with Dr. Amador Cunas to f/u lipids and liver panel.   Problem # 3:  HYPERTENSION (ICD-401.9) BP is elevated here but fairly well controlled t home. Keep BP log and tke with him (with his cuff) to see Dr. Amador Cunas.  Problem # 4:  FATIGUE / MALAISE (ICD-780.79) Suspect he has OSA but he refuses mask so will not  refer for sleep study. Recommended weight loss.  Other Orders: EKG w/ Interpretation (93000)  Patient Instructions: 1)  Start Pravastatin 20mg  2)  Follow up in 1 year Prescriptions: NITROSTAT 0.4 MG SUBL (NITROGLYCERIN) as needed  #25 x 6   Entered by:   Meredith Staggers, RN   Authorized by:   Dolores Patty, MD, [facility]   Signed by:   Meredith Staggers, RN on 06/07/2009   Method used:   [facility]  [address] [phone]* (retail)       [address]       [place], [place]  [address]       Ph: [phone] or [phone]       Fax: [phone]   RxID:   [id number] METOPROLOL TARTRATE 50 MG TABS (METOPROLOL TARTRATE) Take one tablet by mouth twice a day  #60 x 12   Entered by:   Meredith Staggers, RN   Authorized by:   Dolores Patty, MD, [facility]   Signed by:   Meredith Staggers, RN on 06/07/2009   Method used:   [facility]        [facility]  [address] [phone]* (retail)       [address]       [place], [place]  [address]       Ph: [phone] or [phone]       Fax: [phone]   RxID:   [id number] PLAVIX 75 MG TABS (CLOPIDOGREL BISULFATE) 1 by mouth daily  #30 Tablet x 12   Entered by:   Meredith Staggers, RN   Authorized by:   Dolores Patty, MD, [facility]   Signed by:   Meredith Staggers, RN on 06/07/2009   Method used:   [facility]ically [facility]        [facility]  [address] [phone]* (retail)       [address]       [place], [place]  [address]       Ph: [phone] or [phone]       Fax: [phone]   RxID:   [id number] BENAZEPRIL HCL 40 MG TABS (BENAZEPRIL HCL) One daily  #30 Tablet x 12   Entered by:   Meredith Staggers, RN   Authorized by:   Dolores Patty, MD, [facility]   Signed by:   Meredith Staggers, RN on 06/07/2009   Method used:   [facility]        [facility]  [address] [phone]* (retail)       [address]       [place], [place]  [address]       Ph: [phone] or [phone]       Fax: [phone]    RxID:   [id number] PRAVASTATIN SODIUM 20 MG TABS (PRAVASTATIN SODIUM) Take one tablet by mouth daily  at bedtime  #30 x 6   Entered by:   Meredith Staggers, RN   Authorized by:   Dolores Patty, MD, [facility]   Signed by:   Meredith Staggers, RN on 06/07/2009   Method used:   [facility]        [facility]  [address] [phone]* (retail)       [address]       [place], [place]  [address]       Ph: [phone] or [phone]       Fax: [phone]   RxID:   [id number]

## 2010-04-04 NOTE — Assessment & Plan Note (Signed)
Summary: FLU-SHOT/RCD   Nurse Visit   Allergies: No Known Drug Allergies 

## 2010-04-10 ENCOUNTER — Ambulatory Visit (INDEPENDENT_AMBULATORY_CARE_PROVIDER_SITE_OTHER): Payer: BC Managed Care – PPO | Admitting: Gastroenterology

## 2010-04-10 ENCOUNTER — Encounter: Payer: Self-pay | Admitting: Gastroenterology

## 2010-04-10 ENCOUNTER — Encounter (INDEPENDENT_AMBULATORY_CARE_PROVIDER_SITE_OTHER): Payer: Self-pay | Admitting: *Deleted

## 2010-04-10 DIAGNOSIS — Z86010 Personal history of colonic polyps: Secondary | ICD-10-CM

## 2010-04-11 ENCOUNTER — Encounter: Payer: Self-pay | Admitting: Gastroenterology

## 2010-04-12 NOTE — Procedures (Signed)
Summary: Colon  Patient Name: Jose [name], Gill MRN:  Procedure Procedures: Colonoscopy CPT: 57846.    with polypectomy. CPT: A3573898.    with endoclip placement to control immediate post polypectomy bleeding.  Personnel: Endoscopist: Rachael Fee, MD.  Referred By: Gordy Savers, MD.  Exam Location: Exam performed in Endoscopy Suite. Outpatient  Patient Consent: Procedure, Alternatives, Risks and Benefits discussed, consent obtained, from patient. Consent was obtained by the RN.  Indications  Surveillance of: Adenomatous Polyp(s).  Comments: had colonoscopy with polypectomy in [place], [place] 4-5 years ago (pathology not available at time of this procedure but patient was told to have repeat examin in 4-5 years). History  Current Medications: Patient is not currently taking Coumadin.  Comments: Patient history reviewed and updated, pre-procedure physical performed prior to initiation of sedation? yes Pre-Exam Physical: Performed Feb 03, 2007. Cardio-pulmonary exam, Abdominal exam, Mental status exam WNL.  Exam Exam: Extent of exam reached: Cecum, extent intended: Cecum.  The cecum was identified by appendiceal orifice and IC valve. Patient position: on left side. Time to Cecum: 00:02:38. Time for Withdrawl: 00:25:19. Colon retroflexion performed. Images taken. ASA Classification: II. Tolerance: good.  Monitoring: Pulse and BP monitoring, Oximetry used. Supplemental O2 given.  Colon Prep Prep results: good.  Sedation Meds: Patient assessed and found to be appropriate for moderate (conscious) sedation. Fentanyl 100 mcg. given IV. Versed 10 mg. given IV.  Findings POLYP: Transverse Colon, Maximum size: 7 mm. sessile polyp. Procedure:  snare with cautery, removed, retrieved, Polyp sent to pathology. Polyp sent to pathology.  POLYP: Transverse Colon, Maximum size: 10 mm. pedunculated polyp. Procedure:  snare with cautery, removed, retrieved, sent to pathology.  sent to pathology.  POLYP: Transverse Colon, Maximum size: 11 mm. pedunculated polyp. Procedure:  snare with cautery, removed, retrieved, sent to pathology. sent to pathology. Comments: The polypectomy site bled following resection, this did not resolve with saline flush and observation and so a single Resolution endoclip was placed at the site.  Hemostasis was acheived.  POLYP: Ascending Colon, Maximum size: 7 mm. sessile polyp. Procedure:  snare without cautery, removed, retrieved, sent to pathology. sent to pathology.  - DIVERTICULOSIS: Descending Colon to Sigmoid Colon. Comments: a few small diverticulum.  POLYP: Sigmoid Colon, Maximum size: 5 mm. sessile polyp. Procedure:  snare with cautery, removed, retrieved, sent to pathology. sent to pathology.  - NORMAL EXAM: Cecum to Rectum. Comments: otherwise normal examination.  POLYP: Rectum, Maximum size: 3 mm. sessile polyp. Procedure:  snare without cautery, removed, retrieved, sent to pathology. sent to pathology.   Assessment Abnormal examination, see findings above.  Comments: SEVERAL COLON POLYPS, NO CANCERS.  ONE POLYP RESECTION SITE WAS CLIPPED TO CONTROL IMMEDIATE POST-POLYPECTOMY BLEEDING. Events  Unplanned Interventions: No intervention was required.  Bleeding.  Intervention Results: The intervention was successful. Hemostasis was achieved Plans Comments: IF THE POLYPS ARE TUBULAR ADENOMA (PRECANCEROUS POLYPS) HE WILL NEED A REPEAT COLONOSCOPY IN 3 YEARS.  Scheduling/Referral: Await pathology to schedule patient.

## 2010-04-20 NOTE — Letter (Signed)
Summary: Anticoagulation Modification Letter  [facility]  [address] [place], [place] [address]   Phone: [phone]  Fax: [phone]    April 10, 2010  Re:    YOEL KAUFHOLD DOB:    [birth year] MRN:    [id number]    Dear Dr Milas Kocher  We have scheduled the above patient for an endoscopic procedure. Our records show that  he/she is on anticoagulation therapy. Please advise as to how long the patient may come off their therapy of Plavix prior to the scheduled procedure(s) on 05/09/10.    Please fax back/or route the completed form to Patty at [phone].  Thank you for your help with this matter.  Sincerely,  Chales Abrahams CMA Duncan Dull)   Physician Recommendation:  Hold Plavix 5 days prior ________________   Appended Document: Anticoagulation Modification Letter ok to hold per dr Milas Kocher letter to be scanned to EMR  pt aware

## 2010-04-20 NOTE — Letter (Signed)
Summary: [facility] Instructions  [facility]  [address] [place], [place] [address]   Phone: [phone]  Fax: [phone]       Jose [name]    20-Aug-1950    MRN: [id number]        Procedure Day /Date:05/09/10  TUE     Arrival Time:10 am     Procedure Time:11 am     Location of Procedure:                    X  [facility] (4th Floor)                        PREPARATION FOR COLONOSCOPY WITH MOVIPREP   Starting 5 days prior to your procedure 05/04/10 do not eat nuts, seeds, popcorn, corn, beans, peas,  salads, or any raw vegetables.  Do not take any fiber supplements (e.g. Metamucil, Citrucel, and Benefiber).  THE DAY BEFORE YOUR PROCEDURE         DATE: 05/08/10  DAY: MON  1.  Drink clear liquids the entire day-NO SOLID FOOD  2.  Do not drink anything colored red or purple.  Avoid juices with pulp.  No orange juice.  3.  Drink at least 64 oz. (8 glasses) of fluid/clear liquids during the day to prevent dehydration and help the prep work efficiently.  CLEAR LIQUIDS INCLUDE: Water Jello Ice Popsicles Tea (sugar ok, no milk/cream) Powdered fruit flavored drinks Coffee (sugar ok, no milk/cream) Gatorade Juice: apple, white grape, white cranberry  Lemonade Clear bullion, consomm, broth Carbonated beverages (any kind) Strained chicken noodle soup Hard Candy                             4.  In the morning, mix first dose of MoviPrep solution:    Empty 1 Pouch A and 1 Pouch B into the disposable container    Add lukewarm drinking water to the top line of the container. Mix to dissolve    Refrigerate (mixed solution should be used within 24 hrs)  5.  Begin drinking the prep at 5:00 p.m. The MoviPrep container is divided by 4 marks.   Every 15 minutes drink the solution down to the next mark (approximately 8 oz) until the full liter is complete.   6.  Follow completed prep with 16 oz of clear liquid of your choice (Nothing red or purple).   Continue to drink clear liquids until bedtime.  7.  Before going to bed, mix second dose of MoviPrep solution:    Empty 1 Pouch A and 1 Pouch B into the disposable container    Add lukewarm drinking water to the top line of the container. Mix to dissolve    Refrigerate  THE DAY OF YOUR PROCEDURE      DATE: 05/09/10 DAY: TUE  Beginning at 6 a.m. (5 hours before procedure):         1. Every 15 minutes, drink the solution down to the next mark (approx 8 oz) until the full liter is complete.  2. Follow completed prep with 16 oz. of clear liquid of your choice.    3. You may drink clear liquids until 9 am (2 HOURS BEFORE PROCEDURE).   MEDICATION INSTRUCTIONS  Unless otherwise instructed, you should take regular prescription medications with a small sip of water   as early as possible the morning of your procedure.  Stop taking Plavix  05/04/10  (5 days before procedure).       Additional medication instructions: You will be contacted by our office prior to your procedure for directions on holding your Plavix.  If you do not hear from our office 1 week prior to your scheduled procedure, please call [phone] to discuss.           OTHER INSTRUCTIONS  You will need a responsible adult at least 58 years of age to accompany you and drive you home.   This person must remain in the waiting room during your procedure.  Wear loose fitting clothing that is easily removed.  Leave jewelry and other valuables at home.  However, you may wish to bring a book to read or  an iPod/MP3 player to listen to music as you wait for your procedure to start.  Remove all body piercing jewelry and leave at home.  Total time from sign-in until discharge is approximately 2-3 hours.  You should go home directly after your procedure and rest.  You can resume normal activities the  day after your procedure.  The day of your procedure you should not:   Drive   Make legal decisions   Operate  machinery   Drink alcohol   Return to work  You will receive specific instructions about eating, activities and medications before you leave.    The above instructions have been reviewed and explained to me by   _______________________    I fully understand and can verbalize these instructions _____________________________ Date _________

## 2010-04-20 NOTE — Letter (Signed)
Summary: Anticoag  Anticoag   Imported By: Lester Screven 04/14/2010 15:55:47  _____________________________________________________________________  External Attachment:    Type:   Image     Comment:   External Document

## 2010-04-20 NOTE — Assessment & Plan Note (Signed)
Review of gastrointestinal problems: 1. tubular adenomas; colonoscopy 02/2007, recall 3 years    History of Present Illness Visit Type: Initial Consult Primary GI MD: Rob Bunting MD Primary Provider: Eleonore Chiquito, MD Chief Complaint: Consult colon pt on plavix History of Present Illness:     very pleasant 60 year old [name] whom I last saw about 3 years ago. See the colonoscopy results summarized above. Since then he had AMI, underwent angio/stenting with a drug eluting stent.  Has been on plavix since then.  He sees Dr. Milas Kocher for cardiology.  Last visit was 6 months.   he has had no overt GI symptoms.           Current Medications (verified): 1)  Gabapentin 600 Mg Tabs (Gabapentin) .... Take 1 Tablet By Mouth Three Times A Day 2)  Alprazolam 0.5 Mg  Tabs (Alprazolam) .... One Three Times Daily As Needed 3)  Finacea 15 %  Gel (Azelaic Acid) .... Uad 4)  Aspirin 325 Mg  Tabs (Aspirin) .... Daily 5)  Halobetasol Propionate 0.05 % Crea (Halobetasol Propionate) .... Uad 6)  Prevacid 15 Mg Cpdr (Lansoprazole) .... Daily 7)  Benazepril Hcl 40 Mg Tabs (Benazepril Hcl) .... One Daily 8)  Plavix 75 Mg Tabs (Clopidogrel Bisulfate) .Marland Kitchen.. 1 By Mouth Daily 9)  Metoprolol Tartrate 50 Mg Tabs (Metoprolol Tartrate) .... Take One-Half  Tablet By Mouth Twice A Day 10)  Nitrostat 0.4 Mg Subl (Nitroglycerin) .... As Needed 11)  Pravastatin Sodium 20 Mg Tabs (Pravastatin Sodium) .... Take One Tablet By Mouth Daily At Bedtime  Allergies (verified): No Known Drug Allergies  Vital Signs:  Patient profile:   60 year old male Height:      68 inches Weight:      205.50 pounds BMI:     31.36 Pulse rate:   68 / minute Pulse rhythm:   regular BP sitting:   110 / 60  (left arm) Cuff size:   regular  Vitals Entered By: June McMurray CMA Duncan Dull) (April 10, 2010 8:34 AM)  Physical Exam  Additional Exam:  Constitutional: generally well appearing Psychiatric: alert and oriented times  3 Abdomen: soft, non-tender, non-distended, normal bowel sounds    Impression & Recommendations:  Problem # 1:  personal history of adenomatous colon polyps he is on Plavix for drug-eluting coronary stent, he understands that this medicine puts him at increased risk for bleeding complications during a colonoscopy. We will get in touch with his cardiologist to see if it is safe for him to come off the Plavix for 5 days prior to a colonoscopy. His acute MI was about 2 years ago and so hopefully it will indeed be safe. I see no reason for any further blood tests or imaging studies prior to then.  Patient Instructions: 1)  We will get in touch with Dr. Paulla Fore to ask about holding you plavix for 5 days prior to a colonoscopy. 2)  You will be scheduled to have a colonoscopy. 3)  The medication list was reviewed and reconciled.  All changed / newly prescribed medications were explained.  A complete medication list was provided to the patient / caregiver.  Appended Document: Orders Update/Movi    Clinical Lists Changes  Problems: Added new problem of COLONIC POLYPS, ADENOMATOUS, HX OF (ICD-V12.72) Medications: Added new medication of MOVIPREP 100 GM  SOLR (PEG-KCL-NACL-NASULF-NA ASC-C) As per prep instructions. - Signed Rx of MOVIPREP 100 GM  SOLR (PEG-KCL-NACL-NASULF-NA ASC-C) As per prep instructions.;  #1 x 0;  Signed;  Entered by: Chales Abrahams CMA (AAMA);  Authorized by: Rachael Fee MD;  Method used: [facility]  [address] [phone]*, [address] [place], [place], [place]  [address], Ph: [phone] or [phone], Fax: [phone] Orders: Added new Test order of Colonoscopy (Colon) - Signed    Prescriptions: MOVIPREP 100 GM  SOLR (PEG-KCL-NACL-NASULF-NA ASC-C) As per prep instructions.  #1 x 0   Entered by:   Chales Abrahams CMA (AAMA)   Authorized by:   Rachael Fee MD   Signed by:   Chales Abrahams CMA (AAMA) on 04/10/2010   Method used:   [facility]   [address] [phone]* (retail)       [address]       [place], [place]  [address]       Ph: [phone] or [phone]       Fax: [phone]   RxID:   [id number]

## 2010-05-09 ENCOUNTER — Other Ambulatory Visit: Payer: BC Managed Care – PPO | Admitting: Gastroenterology

## 2010-05-31 ENCOUNTER — Other Ambulatory Visit: Payer: Self-pay | Admitting: Internal Medicine

## 2010-06-01 NOTE — Telephone Encounter (Signed)
[address] °

## 2010-06-09 ENCOUNTER — Encounter: Payer: Self-pay | Admitting: Internal Medicine

## 2010-06-13 LAB — LIPID PANEL
Triglycerides: 66 mg/dL (ref <150)
VLDL: 17 mg/dL (ref 0–40)

## 2010-06-13 LAB — CBC
HCT: 40 % (ref 39.0–52.0)
HCT: 39.2 % (ref 39.0–52.0)
HCT: 40.1 % (ref 39.0–52.0)
HCT: 40.2 % (ref 39.0–52.0)
HCT: 45.8 % (ref 39.0–52.0)
Hemoglobin: 13.9 g/dL (ref 13.0–17.0)
Hemoglobin: 15.6 g/dL (ref 13.0–17.0)
Hemoglobin: 13.6 g/dL (ref 13.0–17.0)
MCHC: 35 g/dL (ref 30.0–36.0)
MCHC: 35.1 g/dL (ref 30.0–36.0)
MCV: 105 fL — ABNORMAL HIGH (ref 78.0–100.0)
MCV: 105.6 fL — ABNORMAL HIGH (ref 78.0–100.0)
MCV: 105.8 fL — ABNORMAL HIGH (ref 78.0–100.0)
Platelets: 95 10*3/uL — ABNORMAL LOW (ref 150–400)
Platelets: 106 10*3/uL — ABNORMAL LOW (ref 150–400)
Platelets: 145 10*3/uL — ABNORMAL LOW (ref 150–400)
Platelets: 96 10*3/uL — ABNORMAL LOW (ref 150–400)
RBC: 3.81 MIL/uL — ABNORMAL LOW (ref 4.22–5.81)
RBC: 3.71 MIL/uL — ABNORMAL LOW (ref 4.22–5.81)
RDW: 13.4 % (ref 11.5–15.5)
RDW: 13.4 % (ref 11.5–15.5)
RDW: 13.5 % (ref 11.5–15.5)
RDW: 13.5 % (ref 11.5–15.5)
WBC: 6.1 10*3/uL (ref 4.0–10.5)
WBC: 4.6 10*3/uL (ref 4.0–10.5)

## 2010-06-13 LAB — POCT CARDIAC MARKERS
CKMB, poc: 2.7 ng/mL (ref 1.0–8.0)
CKMB, poc: <1 ng/mL — ABNORMAL LOW (ref 1.0–8.0)
Myoglobin, poc: 222 ng/mL (ref 12–200)
Troponin i, poc: <0.05 ng/mL (ref 0.00–0.09)
Troponin i, poc: <0.05 ng/mL (ref 0.00–0.09)

## 2010-06-13 LAB — CARDIAC PANEL(CRET KIN+CKTOT+MB+TROPI)
CK, MB: 1.1 ng/mL (ref 0.3–4.0)
CK, MB: 1.7 ng/mL (ref 0.3–4.0)
CK, MB: 2.8 ng/mL (ref 0.3–4.0)
CK, MB: 3.1 ng/mL (ref 0.3–4.0)
Relative Index: INVALID (ref 0.0–2.5)
Relative Index: INVALID (ref 0.0–2.5)
Relative Index: 1.3 (ref 0.0–2.5)
Total CK: 70 U/L (ref 7–232)
Total CK: 83 U/L (ref 7–232)
Total CK: 94 U/L (ref 7–232)
Total CK: 244 U/L — ABNORMAL HIGH (ref 7–232)
Troponin I: 0.01 ng/mL (ref 0.00–0.06)

## 2010-06-13 LAB — PROTIME-INR: INR: 1.1 (ref 0.00–1.49)

## 2010-06-13 LAB — DIFFERENTIAL
Basophils Relative: 1 % (ref 0–1)
Lymphocytes Relative: 15 % (ref 12–46)
Monocytes Absolute: 0.6 10*3/uL (ref 0.1–1.0)
Monocytes Relative: 9 % (ref 3–12)
Neutro Abs: 4.7 10*3/uL (ref 1.7–7.7)

## 2010-06-13 LAB — POCT I-STAT, CHEM 8
BUN: 14 mg/dL (ref 6–23)
Chloride: 107 mEq/L (ref 96–112)
Creatinine, Ser: 1 mg/dL (ref 0.4–1.5)
Potassium: 3.6 mEq/L (ref 3.5–5.1)
Sodium: 141 mEq/L (ref 135–145)

## 2010-06-13 LAB — COMPREHENSIVE METABOLIC PANEL
Albumin: 3.4 g/dL — ABNORMAL LOW (ref 3.5–5.2)
Alkaline Phosphatase: 48 U/L (ref 39–117)
BUN: 13 mg/dL (ref 6–23)
Calcium: 9.2 mg/dL (ref 8.4–10.5)
Chloride: 101 mEq/L (ref 96–112)
Creatinine, Ser: 0.97 mg/dL (ref 0.4–1.5)
GFR calc Af Amer: >60 mL/min (ref >60)
GFR calc non Af Amer: >60 mL/min (ref >60)
Potassium: 3.5 mEq/L (ref 3.5–5.1)
Potassium: 3.7 mEq/L (ref 3.5–5.1)
Sodium: 137 mEq/L (ref 135–145)
Total Bilirubin: 1.9 mg/dL — ABNORMAL HIGH (ref 0.3–1.2)
Total Protein: 6.9 g/dL (ref 6.0–8.3)
Total Protein: 6.6 g/dL (ref 6.0–8.3)

## 2010-06-13 LAB — D-DIMER, QUANTITATIVE: D-Dimer, Quant: 0.43 ug/mL-FEU (ref 0.00–0.48)

## 2010-06-13 LAB — CK TOTAL AND CKMB (NOT AT ARMC)
CK, MB: 3.4 ng/mL (ref 0.3–4.0)
Total CK: 219 U/L (ref 7–232)

## 2010-06-13 LAB — HEPARIN LEVEL (UNFRACTIONATED): Heparin Unfractionated: 0.21 IU/mL — ABNORMAL LOW (ref 0.30–0.70)

## 2010-06-13 LAB — BASIC METABOLIC PANEL
BUN: 10 mg/dL (ref 6–23)
BUN: 15 mg/dL (ref 6–23)
CO2: 25 mEq/L (ref 19–32)
CO2: 26 mEq/L (ref 19–32)
Calcium: 8.3 mg/dL — ABNORMAL LOW (ref 8.4–10.5)
Chloride: 99 mEq/L (ref 96–112)
Creatinine, Ser: 0.94 mg/dL (ref 0.4–1.5)
GFR calc non Af Amer: >60 mL/min (ref >60)
Glucose, Bld: 149 mg/dL — ABNORMAL HIGH (ref 70–99)
Glucose, Bld: 94 mg/dL (ref 70–99)

## 2010-06-13 LAB — T4, FREE: Free T4: 0.85 ng/dL (ref 0.80–1.80)

## 2010-06-13 LAB — GLUCOSE, CAPILLARY: Glucose-Capillary: 85 mg/dL (ref 70–99)

## 2010-06-13 LAB — APTT: aPTT: 32 seconds (ref 24–37)

## 2010-06-13 LAB — PLATELET COUNT: Platelets: 113 10*3/uL — ABNORMAL LOW (ref 150–400)

## 2010-06-15 ENCOUNTER — Ambulatory Visit: Payer: BC Managed Care – PPO | Admitting: Internal Medicine

## 2010-06-19 ENCOUNTER — Encounter: Payer: Self-pay | Admitting: Internal Medicine

## 2010-06-19 ENCOUNTER — Ambulatory Visit (INDEPENDENT_AMBULATORY_CARE_PROVIDER_SITE_OTHER): Payer: BC Managed Care – PPO | Admitting: Internal Medicine

## 2010-06-19 VITALS — BP 112/62 | HR 37 | Ht 67.0 in | Wt 189.1 lb

## 2010-06-19 DIAGNOSIS — E781 Pure hyperglyceridemia: Secondary | ICD-10-CM

## 2010-06-19 DIAGNOSIS — I2510 Atherosclerotic heart disease of native coronary artery without angina pectoris: Secondary | ICD-10-CM

## 2010-06-19 DIAGNOSIS — R001 Bradycardia, unspecified: Secondary | ICD-10-CM

## 2010-06-19 DIAGNOSIS — I498 Other specified cardiac arrhythmias: Secondary | ICD-10-CM

## 2010-06-19 LAB — HEPATIC FUNCTION PANEL
Alkaline Phosphatase: 68 U/L (ref 39–117)
Bilirubin, Direct: 0.1 mg/dL (ref 0.0–0.3)
Total Bilirubin: 0.9 mg/dL (ref 0.3–1.2)
Total Protein: 6.4 g/dL (ref 6.0–8.3)

## 2010-06-19 LAB — LIPID PANEL
HDL: 41.4 mg/dL (ref >39.00)
LDL Cholesterol: 87 mg/dL (ref 0–99)
Total CHOL/HDL Ratio: 3

## 2010-06-19 LAB — BASIC METABOLIC PANEL
CO2: 29 mEq/L (ref 19–32)
Calcium: 9.4 mg/dL (ref 8.4–10.5)
Chloride: 102 mEq/L (ref 96–112)
Sodium: 138 mEq/L (ref 135–145)

## 2010-06-19 LAB — CBC WITH DIFFERENTIAL/PLATELET
Basophils Absolute: 0 10*3/uL (ref 0.0–0.1)
Eosinophils Absolute: 0.2 10*3/uL (ref 0.0–0.7)
Lymphocytes Relative: 26.6 % (ref 12.0–46.0)
Lymphs Abs: 1.7 10*3/uL (ref 0.7–4.0)
Monocytes Relative: 10.8 % (ref 3.0–12.0)
Platelets: 131 10*3/uL — ABNORMAL LOW (ref 150.0–400.0)
RDW: 14.1 % (ref 11.5–14.6)

## 2010-06-19 LAB — TSH: TSH: 2.76 u[IU]/mL (ref 0.35–5.50)

## 2010-06-19 NOTE — Assessment & Plan Note (Signed)
No evidence of ischemia. Continue current regimen.   

## 2010-06-19 NOTE — Patient Instructions (Signed)
Labs today Stop Metoprolol EKG in 1 week Your physician wants you to follow-up in: 3 months. You will receive a reminder letter in the mail two months in advance. If you don't receive a letter, please call our office to schedule the follow-up appointment.

## 2010-06-19 NOTE — Assessment & Plan Note (Signed)
Due for lipid and liver panel today. Goal LDL < 70.

## 2010-06-19 NOTE — Progress Notes (Signed)
HPI:  Jose [name] is a 60 y/o male with h/o HTN, GERD, ETOH abuse, coronary artery disease and bifascicular block with RBBB/LAFB. He underwent drug-eluting stenting of the LCX on 07/16/2008.  He was seen in clinic at end of May 2010 doing relatively well but ECG markedly different from previous so admitted for cath. Cath showed widely patent LCX stent with residual 70% OM-1 lesion. EF 45%.   Returns today for f/u. Says he is doing fine.  No CP or SOB. Still looking for a job. Occasional dizzy. Fatigued. No syncope or presyncope. Has quit drinking completely. Takes BP and HR almost every morning and HR is usually 60. Walks on TM every night for 40-45 mins without problem.     ROS: All systems negative except as listed in HPI, PMH and Problem List.  Past Medical History  Diagnosis Date  . FACIAL PARESTHESIA, RIGHT 07/27/2008  . CAD 07/27/2008  . HYPERTENSION 09/18/2006  . GERD 09/18/2006  . 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  . GLUCOSE INTOLERANCE 01/08/2008  . ANXIETY 07/27/2008  . ALCOHOL ABUSE 07/27/2008  . KNEE PAIN, ACUTE 04/07/2007  . SEIZURE DISORDER 09/18/2006  . LOW BACK PAIN 09/18/2006  . Rosacea 07/27/2008  . Hx of adenomatous colonic polyps     Current Outpatient Prescriptions  Medication Sig Dispense Refill  . ALPRAZolam (XANAX) 0.5 MG tablet Take 0.5 mg by mouth 3 (three) times daily as needed.        Marland Kitchen aspirin 325 MG tablet Take 325 mg by mouth daily.        . Azelaic Acid (FINACEA) 15 % cream as directed. After skin is thoroughly washed and patted dry, gently but thoroughly massage a thin film of azelaic acid cream into the affected area twice daily, in the morning and evening.       . benazepril (LOTENSIN) 40 MG tablet Take 40 mg by mouth daily.        . clopidogrel (PLAVIX) 75 MG tablet Take 75 mg by mouth daily.        . halobetasol (ULTRAVATE) 0.05 % cream as directed.        . lansoprazole (PREVACID) 15 MG capsule Take 15 mg by mouth daily.        . metoprolol (LOPRESSOR) 50 MG tablet  Take 25 mg by mouth 2 (two) times daily. 1/2 tablet      . nitroGLYCERIN (NITROSTAT) 0.4 MG SL tablet Place 0.4 mg under the tongue every 5 (five) minutes as needed.        . pravastatin (PRAVACHOL) 20 MG tablet TAKE 1 TABLET BY MOUTH EVERY DAY AT BEDTIME  30 tablet  5  . DISCONTD: gabapentin (NEURONTIN) 600 MG tablet Take 600 mg by mouth 3 (three) times daily.           PHYSICAL EXAM: Filed Vitals:   06/19/10 1035  BP: 112/62  Pulse: 37   General:  Well appearing. No resp difficulty HEENT: normal Neck: supple. JVP flat. Carotids 2+ bilaterally; no bruits. No lymphadenopathy or thryomegaly appreciated. Cor: PMI normal. Huston Foley and regula. No rubs, gallops or murmurs. Lungs: clear Abdomen: soft, nontender, nondistended. No hepatosplenomegaly. No bruits or masses. Good bowel sounds. Extremities: no cyanosis, clubbing, rash, edema Neuro: alert & orientedx3, cranial nerves grossly intact. Moves all 4 extremities w/o difficulty. Flat but pleasant.    ECG: Marked sinus brady 37 with RBBB and LAFB (bifas block). Inferior Qs. QRS 160 ms (bifascicular block is chronic)   ASSESSMENT & PLAN:

## 2010-06-19 NOTE — Assessment & Plan Note (Signed)
He has marked bradycardia with significant conduction disease on ECG. (QRS ). However he remains asymptomatic. I walked him in clinic and got HR up to mid 60s with brisk walk. Will stop his metoprolol - however this is low-dose and doubt it is major factor. Will also check labs including BMET and thyroid panel. Suggested monitor but he wants to defer due to cost. Will bring back in for ECG off b-blocker next week. Discussed need for possible pacemaker in future.

## 2010-06-22 ENCOUNTER — Ambulatory Visit: Payer: BC Managed Care – PPO | Admitting: Internal Medicine

## 2010-06-26 ENCOUNTER — Encounter: Payer: Self-pay | Admitting: Cardiology

## 2010-06-26 ENCOUNTER — Ambulatory Visit (INDEPENDENT_AMBULATORY_CARE_PROVIDER_SITE_OTHER): Payer: BC Managed Care – PPO

## 2010-06-26 DIAGNOSIS — I10 Essential (primary) hypertension: Secondary | ICD-10-CM

## 2010-06-27 ENCOUNTER — Other Ambulatory Visit: Payer: Self-pay | Admitting: Internal Medicine

## 2010-07-17 ENCOUNTER — Telehealth: Payer: Self-pay | Admitting: *Deleted

## 2010-07-17 NOTE — Telephone Encounter (Signed)
Please call pt. Regarding a question he has about his labs for his next visit.  He wants to have Dr. Kirtland Bouchard use the labs from his Cardiologist for he upcoming CPX?

## 2010-07-17 NOTE — Telephone Encounter (Signed)
Spoke with pt - informed labs in chart will do - the only thing missing is a PSA -

## 2010-07-18 NOTE — Discharge Summary (Signed)
NAME:  Jose [name], Jose [name] NO.:  [id number]   MEDICAL RECORD NO.:  [id number]          PATIENT TYPE:  INP   LOCATION:  3728                         FACILITY:  [facility]   PHYSICIAN:  Bevelyn Buckles. Bensimhon, MDDATE OF BIRTH:  January 24, 1951   DATE OF ADMISSION:  07/28/2008  DATE OF DISCHARGE:  07/29/2008                               DISCHARGE SUMMARY   PRIMARY CARDIOLOGIST:  Bevelyn Buckles. Bensimhon, MD   PRIMARY MEDICAL DOCTOR:  Gordy Savers, MD   DISCHARGE DIAGNOSIS:  Coronary artery disease status post cardiac  catheterization (please see addendum for details).   SECONDARY DIAGNOSES:  1. Hypertension.  2. Ethyl alcohol abuse.  3. Facial paresthesia, right.  4. Gastroesophageal reflux disease.  5. Thrombocytopenia.  6. Glucose intolerance.  7. Anxiety.  8. History of colonic polyps, adenomatous.  9. Special screening malignant neoplasm of prostate.  10.Acute knee pain.  11.Seizure disorder.  12.Low back pain.   ALLERGIES:  NKDA.   PROCEDURES PERFORMED DURING THIS HOSPITALIZATION:  1. 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  No significant Q-waves and left axis deviation.  No evidence      of hypertrophy.  PR 158, QRS 158, and QTC 518.  T-wave inversions      significantly different from prior tracing completed on Jul 16, 2008.  2. Cardiac catheterization:  Completed on Jul 29, 2008, (see addendum      for details).   HISTORY OF PRESENT ILLNESS:  Mr. Fussell is a 60 year old Caucasian male  with a history of coronary artery disease status post recent PCI/DES of  the left circumflex on Jul 15, 2008.  Since his discharge, he has been  doing reasonably well, but he reports one episode of chest pain while  driving  approximately 1 week on Jul 21, 2008.  It was a 4/10 in severity  and located at the left side of his chest.  He reports that these  symptoms were significantly different from prior angina.  The pain  lasted approximately for 15 minutes and resolved spontaneously.  He  denies any recurrence of those symptoms since.  He has been walking  daily and he has noted dyspnea on exertion if he increases his pace.  He  was not exercising prior to his admission with ACS.  He has cut his EtOH  intake down from 2-3 drinks per day to 2 drinks per day.  He has been  informed on several occasions as to the importance of 100% abstinence.  His BPs at home has been running in the 140s/80s to 90s.  Due to the  patient's significant changes on his EKG as well as his recent cardiac  catheterization and stenting, it was deemed  necessary to admit the  patient for a re-look cardiac catheterization to determine if he had any  changes in his disease or possible in-stent stenosis.   HOSPITAL COURSE:  The patient admitted and underwent procedures as  described above.  He tolerated them well without any significant  complications.  On admission, the patient's blood pressure was  significantly elevated on 162-164/100, but decreased over his brief  hospital course.  Most recent vital signs on the day of discharge  temperature 97.9 degrees Fahrenheit, BP 137/84, pulse 78, respiration  rate 18, O2 saturation 96% on room air.   The patient is currently recovering after cardiac catheterization and  his discharge is planned for today pending any significant changes.  The  patient will be given his old medication list with no changes, his  followup instructions, as well as his post cath instructions.  He should  have no questions or concerns that are not addressed at that time.   The patient will follow up with Dr. Arvilla Meres on August 05, 2008, at  3:45 p.m.   Note:  The patient should have followup regarding elevated  TSH level of  9.596.   DISCHARGE LABORATORIES:  WBC 6.1, HGB 13.9, HCT 40.0, PLT count is 95  (stable).  Sodium 137, potassium 3.5, chloride 101, CO2 of 27, BUN 10,  creatinine 0.97, glucose is 84.  Total bilirubin 1.9, alkaline  phosphatase 51, AST 63, ALT 58, total protein 6.9, albumin 3.4, calcium  9.2.  TSH of 9.596.  Total cholesterol 195, triglycerides 84, HDL 37,  LDL 141, VLDL 17.   FOLLOWUP PLANS AND APPOINTMENT:  Please see hospital course.   DISCHARGE MEDICATIONS:  1. Gabapentin 600 mg p.o. t.i.d.  2. Alprazolam 0.5 mg p.o. t.i.d. p.r.n. anxiety.  3. Finacea 15% gel.  4. Aspirin 325 mg p.o. daily.  5. Halobetasol propionate 0.05% cream.  6. Prevacid 15 mg p.o. daily.  7. Benazepril HCl 40 mg p.o. daily.  8. Plavix 75 mg p.o. daily.  9. Metoprolol 25 mg p.o. b.i.d.  10.Nitroglycerin 0.4 mg sublingual p.r.n. chest pain.   The patient's discharge encounter including physician time was 35  minutes.      Jarrett Ables, [facility]      Daniel R. Bensimhon, MD  Electronically Signed    MS/MEDQ  D:  07/29/2008  T:  07/30/2008  Job:  [id number]   cc:   Gordy Savers, MD

## 2010-07-18 NOTE — H&P (Signed)
NAME:  Jose [name], Jose [name] NO.:  [id number]   MEDICAL RECORD NO.:  [id number]          PATIENT TYPE:  INP   LOCATION:  0110                         FACILITY:  [facility]   PHYSICIAN:  Bevelyn Buckles. Bensimhon, MDDATE OF BIRTH:  [birth year]   DATE OF ADMISSION:  07/14/2008  DATE OF DISCHARGE:                              HISTORY & PHYSICAL   PRIMARY CARDIOLOGIST:  Bevelyn Buckles. Bensimhon, M.D. ([place]).   PRIMARY CARE PHYSICIAN:  Gordy Savers, M.D.   CHIEF COMPLAINT:  Chest pain.   HISTORY OF PRESENT ILLNESS:  This is a 60 year old male with no known  history of CAD, a normal chemical Myoview in 2003, normal echocardiogram  in 2007, risk factors including hypertension and ETOH abuse as well as  history significant for anxiety, GERD, questionable history of seizure  disorder, presenting with right sided facial numbness and right upper  extremity numbness and tingling today that was followed by a band-like  chest tightness associated with shortness of breath and nausea as well  as new T wave inversion in V1 through V3 and widening of right bundle  branch block on his EKG as compared to one completed in 2007.  The  patient was driving to [facility] when he started noticing right sided  facial numbness and right upper extremity numbness and tingling that has  since resolved.  Shortly after the onset of these symptoms, the patient  began to notice band-like chest tightness, 5/10 at worse associated with  shortness of breath and nausea.  The patient continued to have symptoms  for several hours and while he was at the airport in [place] he was  seen by airport medical staff and was noted to be hypertensive in sinus  tachycardia in the low 100s and to have EKG changes described above.  The patient was sent home and told to follow up with his primary care  doctor, however, once the patient arrived home, he informed his wife of  his symptoms and she took him to [facility].   During this  evaluation, the patient's decreased to a 3/10, shortness of breath and  nausea mostly resolved, facial numbness and upper extremity numbness and  tingling have also resolved.  In the emergency department the patient  received 1 sublingual nitroglycerin as well as an other full strength  aspirin.  The patient reports somewhat similar symptoms/episodes of  chest pain, shortness of breath, dyspnea on exertion and nausea in the  past, however, facial numbness/tingling as well as upper extremity  symptoms are new.   PAST MEDICAL HISTORY:  1. Hypertension.  2. GERD.  3. Anxiety.  4. Torn meniscus in right knee.  5. Questionable history of seizure disorder.  6. Rosacea.  7. Ongoing ETOH abuse.   SOCIAL HISTORY:  The patient lives in [place] with his wife.  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  He has no tobacco abuse history.  He drinks 2 to 3 drinks a day per week with binge drinking on the  weekends occasionally.  No illicit drug use.  Only herbal medication is  vitamin C.  He has a regular diet.  He does not regular exercise.   FAMILY HISTORY:  Mother deceased age 17 of lung cancer and diabetes  mellitus.  Father deceased age 54 with emphysema.  One sister with  hypertension.  The patient's uncle on his mother's sided died in his 51s  or 54s from a massive MI.  Two other MIs on mother's side.   REVIEW OF SYSTEMS:  Patient reports significant anxiety at night,  especially on Sunday's.  Patient often will have severe insomnia as well  associated with occasional diaphoresis.  The patient denies paroxysmal  nocturnal dyspnea although he does report occasional mild orthopnea.  Denies edema, palpitations, pre/syncope, although he does have a history  of syncope in the distant past.  Currently the patient's only  neurological symptoms, other than anxiety, are occasional episodes of  very brief  confusion lasting minutes.  Per his wife, the patient's  sometimes is not oriented to dates or place for 1 to 3 minutes.  The  patient also reports a history of  hematuria for which he has never been  worked up, although he has not had any symptoms in several months.  All  other systems reviewed and were negative.  Patient reports recent  sunburn.   ALLERGIES:  NKDA.   MEDICATIONS:  1. Gabapentin 600 mg p.o. t.i.d.  2. Alprazolam 0.5 mg p.o. b.i.d. p.r.n. for anxiety.  3. Benazepril  40 mg p.o. daily.  4. Omeprazole 20 mg p.o. daily.  5. Aspirin 81 mg p.o. daily.   PHYSICAL EXAMINATION:  VITAL SIGNS:  Temperature 98.8F, blood pressure  137/83, pulse 87, respiratory rate 20, oxygen saturation 98% on 2L by  nasal cannula.  GENERAL:  Patient alert and oriented x3, in no apparent distress, is  able to speak and move fairly easily without any respiratory distress.  HEENT:  Head is normocephalic, atraumatic. Pupils equal, round, reactive  to light. Extraocular muscles aer intact. Nares are patent without  discharge.  Dentition is fair.  Oropharynx is without erythema or  exudate.  NECK:  Supple without lymphadenopathy.  No thyromegaly, no JVD, no  bruits.  HEART:  Rate is regular with audible S1 and S2.  No clicks, rubs,  murmurs or gallops, however, heart sounds are diminished.  Pulses are 2+  and equal in both upper and lower extremities bilaterally.  LUNGS:  Clear to auscultation bilaterally with decreased breath sounds  at bases.  SKIN:  No rashes, lesions or petechiae, however, face is erythematous,  patient with recent sunburn.  ABDOMEN:  Soft, nontender, nondistended, normal abdominal bowel sounds.  No rebound or guarding. No hepatosplenomegaly.  No pulsations.  Patient  is obese.  EXTREMITIES:  Show no cyanosis, clubbing or edema.  MUSCULOSKELETAL:  Exam reveals no joint deformity or fusions.  No spinal  or costovertebral angle tenderness.  NEUROLOGICAL:  Cranial nerves  II-XII are grossly intact.  Strength is  5/5 in all extremities and axial groups.  Normal sensation throughout  and normal cerebellar function.   CLINICAL DATA:  Radiology:  Chest x-ray showed bronchitic changes that  could be acute or chronic.  Patient had an echocardiogram July, 2007  showing LVEF estimated at 55 to 60% and no left ventricular region wall  motion abnormalities, left ventricle wall upper limits of normal.   EKG shows sinus tachycardia at a rate of 106 BPM.  New T wave inversion  in V1 through V3 and slight widening of right bundle branch block with  possible left anterior fascicular block.  Left axis deviation.  No  evidence of hypertrophy. No significant Q waves.  PR 156, QRS 115, QTC  518.  Changes noted above from EKG completed May, 2007.   LABORATORY DATA:  White blood cell count 6.38, hemoglobin 15.8,  hematocrit 45.8, platelet count 145,000. Sodium 142, potassium 3.7,  chloride 107, CO2 21, BUN 13, creatinine 1.01, glucose 88, AST 64, ALT  59, total protein 6.6, albumin 3.4, calcium 8.8.  Point of care markers  were negative, however, myoglobin was elevated at 222.   ASSESSMENT AND PLAN:  This is a 60 year old male with no known history  of coronary artery disease with negative work up in 2003 and a normal  echocardiogram in 2007, risk factors including ongoing ETOH abuse and  hypertension as well as history significant for anxiety,  gastroesophageal reflux disease and a questionable history of seizure  disorder, presenting with left sided facial numbness and left upper  extremity numbness as well as tingling and a band-like chest tightness  associated with shortness of breath and nausea.  The patient also had T  wave inversion that is different from prior EKGs in V1 through V3 and  slight widening of previous right bundle branch block.  1. Unstable angina.  Plan on cardiac catheterization tomorrow morning.      Will cycle cardiac enzymes in the meantime and  continue his home      medications.  Will start heparin after neurological consult, if      okay with them.  2. Possible transient ischemic attack.  Will request neurological      consult and follow recommendations.  Will also order carotid      ultrasound.  3. Ongoing ETOH abuse.  Will initiate DT prophylaxis.      Jarrett Ables, [facility]      Daniel R. Bensimhon, MD  Electronically Signed    MS/MEDQ  D:  07/14/2008  T:  07/14/2008  Job:  [id number]

## 2010-07-18 NOTE — Discharge Summary (Signed)
NAME:  Jose [name], Jose [name] NO.:  [id number]   MEDICAL RECORD NO.:  [id number]           PATIENT TYPE:   LOCATION:                                 FACILITY:   PHYSICIAN:  Verne Carrow, MDDATE OF BIRTH:  [birth year]   DATE OF ADMISSION:  DATE OF DISCHARGE:                               DISCHARGE SUMMARY   ADDENDUM   Platelet count at time of discharge 96,000.  Stable for discharge home.  Followup CBC at outpatient visit.      Dorian Pod, ACNP      Verne Carrow, MD  Electronically Signed    MB/MEDQ  D:  07/16/2008  T:  07/17/2008  Job:  [id number]

## 2010-07-18 NOTE — Discharge Summary (Signed)
NAME:  Jose [name], Jose [name] NO.:  [id number]   MEDICAL RECORD NO.:  [id number]          PATIENT TYPE:  INP   LOCATION:  2502                         FACILITY:  [facility]   PHYSICIAN:  Verne Carrow, MDDATE OF BIRTH:  11-24-1950   DATE OF ADMISSION:  07/15/2008  DATE OF DISCHARGE:  07/16/2008                               DISCHARGE SUMMARY   PRIMARY CARDIOLOGIST:  Bevelyn Buckles. Bensimhon, MD   PRIMARY CARE PHYSICIAN:  Gordy Savers, MD   DISCHARGING DIAGNOSES:  1. Coronary artery disease status post cardiac      catheterization/percutaneous coronary intervention with a drug-      eluting stent to the obtuse marginal-2 with an Endeavor drug-      eluting stent.  The patient also a participant in the [place]      Registry.  2. Thrombocytopenia with unclear etiology.  Most recent platelet count      83,000 at 5:00 a.m. on day of discharge.  Followup platelet count      pending at this time.  3. Transient right face and arm paresthesias, evaluated by Neurology.      Negative MRI.  No stroke, no transient ischemic attack per      Neurology.  4. Ongoing ethyl alcohol abuse.  5. Hypertension.  6. Gastroesophageal reflux disease.  7. Rosacea.  8. Anxiety.   PAST MEDICAL HISTORY:  1. Questionable history of seizure disorder.  2. Torn meniscus in right knee.   CONSULT THIS ADMISSION:  Pramod P. Pearlean Brownie, MD, on Jul 14, 2008.   PROCEDURES THIS ADMISSION:  1. MR/MRA of the head on Jul 14, 2008.  2. Cardiac catheterization/percutaneous coronary intervention on Jul 15, 2008.   HOSPITAL COURSE:  Mr. Brooke Dare is a 60 year old male with no known history  of coronary artery disease with past medical history include  hypertension and EtOH abuse as well as history of significant anxiety,  questionable history of seizure disorder.  He presented with right-sided  facial numbness and right extremity numbness and tingling.  Also  associated with a band-like chest tightness  associated with increased  shortness of breath, nausea, as well as new T-wave inversions in V1  through V3.  Lining of right bundle-branch block on his EKG as compared  to one obtain in 2007.  The patient presented to emergency room  complaining of the above symptoms.  Chest x-ray showed bronchitic  changes.  EKG showing sinus tachycardia, rate of 106, new T-wave  inversion in V1 through V3.  Initial lab work showed a platelet count of  145,000, potassium 3.7, creatinine 1.01.  Point-of-care markers were  negative; however, myoglobin was elevated at 222.  The patient was  admitted for unstable angina, questionable TIA.  Neurology was asked for  their input.  Dr. Pearlean Brownie saw the patient in consultation.  MRI of the  brain with MRA obtained.  The patient ruled out for neurological event  per neuro team.  The patient with ongoing chest discomfort and EKG  changes was taken to the Cardiac Catheterization Lab.  The patient with  normal LVEF 55%, no wall motion abnormalities noted, 95% distal lesion  in the circ, 75% ostial lesion in OM-2.  The patient underwent  successful PCI of the left circumflex with an Endeavor drug-eluting  stent.  The patient also had carotid Dopplers done that showed no ICA  stenosis bilaterally.  Pending results of repeat CBC and whether or not  platelets continued to drift down may require further evaluation in the  patient.  If not, we will arrange for the patient to be discharged home.  Follow up outpatient.  He will need a repeat CBC in the office either  way.   MEDICATIONS AT THE TIME OF DISCHARGE:  1. Lopressor 25 mg b.i.d.  2. Neurontin 600 mg t.i.d.  3. Benazepril 40 mg daily.  4. Protonix 40 mg daily.  5. Aspirin 325.  6. Plavix 75.  7. Nitroglycerin as needed.  8. The patient may resume his Xanax and Vicodin as previously      prescribed.  9. He will not be going home on a statin at this time with his history      of EtOH abuse.  He also has mildly  elevated AST and ALT of 64 and      59 respectively.   The patient will follow up with Dr. Prescott Gum physician extender on  Jul 28, 2008, at 2:15, at which time CBC will need to be repeated.  The  patient has been given the post-cardiac catheterization discharge  instructions.  Pending results of repeat blood work, will tentatively be  discharged home today.   DURATION OF DISCHARGE ENCOUNTER:  Greater than 30 minutes.      Dorian Pod, ACNP      Verne Carrow, MD  Electronically Signed    MB/MEDQ  D:  07/16/2008  T:  07/17/2008  Job:  [id number]

## 2010-07-18 NOTE — Consult Note (Signed)
NAME:  Jose [name], Jose [name]                  ACCOUNT NO.:  [id number]   MEDICAL RECORD NO.:  [id number]          PATIENT TYPE:  INP   LOCATION:  0110                         [facility]ACILITY[facility]   PHYSICIAN:  Pramod P. Pearlean Brownie, MD    DATE OF BIRTH:  December 06, 1950   DATE OF CONSULTATION:  07/14/2008  DATE OF DISCHARGE:                                 CONSULTATION   REASON FOR REFERRAL:  TIA and facial numbness.   HISTORY OF PRESENT ILLNESS:  Mr. Brooke Dare is a 60 year old Caucasian male  who developed sudden onset of right face paresthesias at around 7:00  this morning when he was driving around and went to [place].  He  reached his destination and mentioned his symptoms.  He also noticed  some tingling and numbness in the right hand as well.  He was seen by a  physician there who found his heart rate and blood pressure to be  elevated.  He was advised to come back to [place] and to not drive.  He complained of blurred vision initially which subsequently improved.  His numbness in the face and right hand persisted for 8-10 hours and  recovered only after he came to [facility].  Now is  complaining of some numbness in his left hand as well.  He subsequently  also had chest pain earlier this afternoon and was given nitroglycerin,  which apparently not only relieved the chest pain but his facial  numbness and hand numbness also improved following this.  He states he  has had no prior history of definite stroke or TIAs.  However, he has  had episodes when he would fall down and legs would give out.  He was  seen by Dr. Nash Shearer in our office in 2007 and he was placed on Neurontin  600 three times a day which apparently helped his back pain and leg  numbness as well as he has had no further episodes since then.   PAST MEDICAL HISTORY:  Significant for hypertension, gastroesophageal  reflux disease with right meniscal tear anxiety.   HOME MEDICATIONS:  1. Neurontin 600 three times a day.  2. Xanax 0.5 kg a day.  3. Benazepril 40 once a day.  4. Aspirin 81 mg a day.   ALLERGIES TO MEDICATIONS:  None listed.   FAMILY HISTORY:  Noncontributory.   SOCIAL HISTORY:  The patient is married, lives with his wife does not  smoke cigarettes.  He lives in [place].  He has alcohol to 2 to 3  times a week.   REVIEW OF SYSTEMS:  Positive for chest pain, anxiety, numbness,  tingling, blurred vision.   PHYSICAL EXAM:  CONSTITUTIONAL:  Reveals obese middle-aged Caucasian  male who is currently not in distress.  VITAL SIGNS:  Afebrile, pulse rate of 110, blood pressure 141/96,  respiratory 24, temperature 98.2.  HEENT:  Head is nontraumatic.  Neck is supple.  No bruit.  ENT exam  unremarkable.  CARDIAC:  Regular heart sounds, no gallop.  LUNGS:  Clear to auscultation.  NEUROLOGIC:  The patient is awake, alert.  He is oriented to time, place  and person.  There is no aphasia, apraxia or dysarthria.  Speech is  normal.  Eye movements are full range.  Face is symmetric.  Tongue is  midline.  Motor system exam reveals no upper or lower extremity drift,  symmetric strength, tone and reflexes in all extremities.  Plantars are  downgoing.  Sensation is intact.  He has mild action tremor of both  outstretched upper extremities which is very fine and likely of  underlying anxiety.  There is no cogwheel rigidity or pill rolling  quality.  Gait was not tested.   DATA REVIEWED:  Electrolytes, WBC count, cardiac enzymes are normal.   IMPRESSION:  A 60 year old gentleman with transient right face and arm  paresthesias which seem to have improved.  He certainly has significant  underlying anxiety which may be responsible for symptoms but given age  and vascular risk factors, I think evaluation for stroke is necessary.   PLAN:  Recommend MRI scan of the brain with MRA of the brain, carotid  Doppler, 2-D echo, fasting lipid profile, hemoglobin A1c.  Agree with  increasing aspirin from 81 to  325 mg a day.  We will be happy to follow  the patient in consult.  Kindly call for questions.  I had long  discussion with the patient, his wife, and multiple family members and  answered questions.           ______________________________  Sunny Schlein. Pearlean Brownie, MD     PPS/MEDQ  D:  07/14/2008  T:  07/14/2008  Job:  [id number]

## 2010-07-18 NOTE — Discharge Summary (Signed)
NAME:  TIA, GELB NO.:  [id number]   MEDICAL RECORD NO.:  [id number]          PATIENT TYPE:  INP   LOCATION:  3728                         FACILITY:  [facility]   PHYSICIAN:  Bevelyn Buckles. Bensimhon, MDDATE OF BIRTH:  Dec 20, 1950   DATE OF ADMISSION:  07/28/2008  DATE OF DISCHARGE:  07/29/2008                               DISCHARGE SUMMARY   ADDENDUM   This is an addendum to the previously dictated discharge summary on Jul 29, 2008.   The patient's cardiac catheterization completed on Jul 29, 2008 showed:  1. Nonobstructive CAD with patent stent in the distal left circumflex      artery.  2. Mild LV dysfunction with focal wall abnormality consistent with      previous inferior apical MI.  3. Elevated LV diastolic pressures.   RECOMMENDATIONS:  Continue current medications.      Jarrett Ables, [facility]      Daniel R. Bensimhon, MD  Electronically Signed    MS/MEDQ  D:  07/29/2008  T:  07/30/2008  Job:  [id number]   cc:   Bevelyn Buckles. Bensimhon, MD

## 2010-07-19 ENCOUNTER — Other Ambulatory Visit (INDEPENDENT_AMBULATORY_CARE_PROVIDER_SITE_OTHER): Payer: PRIVATE HEALTH INSURANCE | Admitting: *Deleted

## 2010-07-19 DIAGNOSIS — I498 Other specified cardiac arrhythmias: Secondary | ICD-10-CM

## 2010-07-19 LAB — TSH: TSH: 3.74 u[IU]/mL (ref 0.35–5.50)

## 2010-07-20 ENCOUNTER — Other Ambulatory Visit: Payer: Self-pay

## 2010-07-27 ENCOUNTER — Ambulatory Visit (INDEPENDENT_AMBULATORY_CARE_PROVIDER_SITE_OTHER): Payer: PRIVATE HEALTH INSURANCE | Admitting: Internal Medicine

## 2010-07-27 ENCOUNTER — Encounter: Payer: Self-pay | Admitting: Internal Medicine

## 2010-07-27 DIAGNOSIS — Z0000 Encounter for general adult medical examination without abnormal findings: Secondary | ICD-10-CM

## 2010-07-27 MED ORDER — PRAVASTATIN SODIUM 20 MG PO TABS: 20.0000 mg | ORAL_TABLET | Freq: Every day | ORAL | Status: DC

## 2010-07-27 MED ORDER — ALPRAZOLAM 0.5 MG PO TABS: 0.5000 mg | ORAL_TABLET | Freq: Three times a day (TID) | ORAL | Status: DC | PRN

## 2010-07-27 MED ORDER — LANSOPRAZOLE 15 MG PO CPDR: 15.0000 mg | DELAYED_RELEASE_CAPSULE | Freq: Every day | ORAL | Status: DC

## 2010-07-27 MED ORDER — CLOPIDOGREL BISULFATE 75 MG PO TABS: 75.0000 mg | ORAL_TABLET | Freq: Every day | ORAL | Status: DC

## 2010-07-27 MED ORDER — BENAZEPRIL HCL 40 MG PO TABS: 40.0000 mg | ORAL_TABLET | Freq: Every day | ORAL | Status: DC

## 2010-07-27 NOTE — Progress Notes (Signed)
  Subjective:    Patient ID: Jose [name], male    DOB: [birth year], 60 y.o.   MRN: [id number]  HPI  60 year old patient who is seen today for an annual exam. He is followed closely by cardiology for coronary artery disease. He is status post MI and stenting 2 years ago. He remains on chronic Plavix therapy. He does have a history of mild thrombocytopenia which has been stable. There is a prior history of alcohol abuse and mild LFT abnormalities. He has been abstinent for over one year. He presently is unemployed complaints include occasional fatigue. There was some concerns about hypothyroidism due to recent bradycardia    Review of Systems  Constitutional: Positive for fatigue. Negative for fever, chills, activity change and appetite change.  HENT: Negative for hearing loss, ear pain, congestion, rhinorrhea, sneezing, mouth sores, trouble swallowing, neck pain, neck stiffness, dental problem, voice change, sinus pressure and tinnitus.   Eyes: Negative for photophobia, pain, redness and visual disturbance.  Respiratory: Negative for apnea, cough, choking, chest tightness, shortness of breath and wheezing.   Cardiovascular: Negative for chest pain, palpitations and leg swelling.  Gastrointestinal: Negative for nausea, vomiting, abdominal pain, diarrhea, constipation, blood in stool, abdominal distention, anal bleeding and rectal pain.  Genitourinary: Negative for dysuria, urgency, frequency, hematuria, flank pain, decreased urine volume, discharge, penile swelling, scrotal swelling, difficulty urinating, genital sores and testicular pain.  Musculoskeletal: Negative for myalgias, back pain, joint swelling, arthralgias and gait problem.  Skin: Negative for color change, rash and wound.  Neurological: Negative for dizziness, tremors, seizures, syncope, facial asymmetry, speech difficulty, weakness, light-headedness, numbness and headaches.  Hematological: Negative for adenopathy. Does not bruise/bleed  easily.  Psychiatric/Behavioral: Positive for sleep disturbance. Negative for suicidal ideas, hallucinations, behavioral problems, confusion, self-injury, dysphoric mood, decreased concentration and agitation. The patient is not nervous/anxious.        Objective:   Physical Exam  Constitutional: He appears well-developed and well-nourished.       Blood pressure 118/80. Pulse rate mid 70s  HENT:  Head: Normocephalic and atraumatic.  Right Ear: External ear normal.  Left Ear: External ear normal.  Nose: Nose normal.  Mouth/Throat: Oropharynx is clear and moist.  Eyes: Conjunctivae and EOM are normal. Pupils are equal, round, and reactive to light. No scleral icterus.  Neck: Normal range of motion. Neck supple. No JVD present. No thyromegaly present.  Cardiovascular: Regular rhythm, normal heart sounds and intact distal pulses.  Exam reveals no gallop and no friction rub.   No murmur heard. Pulmonary/Chest: Effort normal and breath sounds normal. He exhibits no tenderness.  Abdominal: Soft. Bowel sounds are normal. He exhibits no distension and no mass. There is no tenderness.  Genitourinary: Prostate normal and penis normal.  Musculoskeletal: Normal range of motion. He exhibits no edema and no tenderness.  Lymphadenopathy:    He has no cervical adenopathy.  Neurological: He is alert. He has normal reflexes. No cranial nerve deficit. Coordination normal.  Skin: Skin is warm and dry. No rash noted.  Psychiatric: He has a normal mood and affect. His behavior is normal.          Assessment & Plan:   Annual clinical exam Coronary artery disease stable  thrombocytopenia Osteoarthritis with end-stage arthritis right knee Hypertension controlled History of alcohol  abuse

## 2010-07-27 NOTE — Patient Instructions (Signed)
It is important that you exercise regularly, at least 20 minutes 3 to 4 times per week.  If you develop chest pain or shortness of breath seek  medical attention.  Limit your sodium (Salt) intake  Return in one year for follow-up  Cardiology followup as scheduled  Schedule your colonoscopy to help detect colon cancer.

## 2010-07-28 LAB — PSA: PSA: 1.8 ng/mL (ref 0.10–4.00)

## 2010-08-25 ENCOUNTER — Telehealth: Payer: Self-pay

## 2010-08-25 MED ORDER — PANTOPRAZOLE SODIUM 40 MG PO TBEC: 40.0000 mg | DELAYED_RELEASE_TABLET | Freq: Every day | ORAL | Status: DC

## 2010-08-25 NOTE — Telephone Encounter (Signed)
Attempt to call -ans mach at [facility] -LMTCB if question - ins denied prevacid , they have step therapy that needs to be done first - will sent new rx for Lansoprazole to [facility] in [place] - if try for 2-3 weeks and does not work - call and there are other we will try. KIK

## 2010-09-13 ENCOUNTER — Ambulatory Visit: Payer: BC Managed Care – PPO | Admitting: Internal Medicine

## 2010-12-04 ENCOUNTER — Ambulatory Visit (INDEPENDENT_AMBULATORY_CARE_PROVIDER_SITE_OTHER): Payer: PRIVATE HEALTH INSURANCE | Admitting: Internal Medicine

## 2010-12-04 ENCOUNTER — Encounter: Payer: Self-pay | Admitting: Internal Medicine

## 2010-12-04 DIAGNOSIS — N390 Urinary tract infection, site not specified: Secondary | ICD-10-CM

## 2010-12-04 DIAGNOSIS — R209 Unspecified disturbances of skin sensation: Secondary | ICD-10-CM

## 2010-12-04 DIAGNOSIS — I10 Essential (primary) hypertension: Secondary | ICD-10-CM

## 2010-12-04 MED ORDER — HYDROCODONE-HOMATROPINE 5-1.5 MG/5ML PO SYRP: 5.0000 mL | ORAL_SOLUTION | Freq: Four times a day (QID) | ORAL | Status: DC | PRN

## 2010-12-04 NOTE — Patient Instructions (Signed)
Drink as much fluid as you  can tolerate over the next few days  Hold Lotensin  Take your antibiotic as prescribed until ALL of it is gone, but stop if you develop a rash, swelling, or any side effects of the medication.  Contact our office as soon as possible if  there are side effects of the medication.

## 2010-12-04 NOTE — Progress Notes (Signed)
  Subjective:    Patient ID: Jose [name], male    DOB: 16-Feb-1951, 60 y.o.   MRN: [id number]  HPI  60 year old patient who has a history of hypertension and coronary artery disease. He was seen at the urgent care and [place] 2 days ago and present is on Cipro for acute prostatitis. He presented with fever flank pain and weakness. He is much improved. On complaint today is significant refractory cough present for the past 3 or 4 days. He has been on Lotensin for blood pressure control. He has had prior episodes of prostatitis in the past ; ED evaluation 2 days ago apparently revealed pyuria    Review of Systems  Constitutional: Positive for fever and fatigue. Negative for chills and appetite change.  HENT: Negative for hearing loss, ear pain, congestion, sore throat, trouble swallowing, neck stiffness, dental problem, voice change and tinnitus.   Eyes: Negative for pain, discharge and visual disturbance.  Respiratory: Positive for cough. Negative for chest tightness, wheezing and stridor.   Cardiovascular: Negative for chest pain, palpitations and leg swelling.  Gastrointestinal: Negative for nausea, vomiting, abdominal pain, diarrhea, constipation, blood in stool and abdominal distention.  Genitourinary: Negative for urgency, hematuria, flank pain, discharge, difficulty urinating and genital sores.  Musculoskeletal: Negative for myalgias, back pain, joint swelling, arthralgias and gait problem.  Skin: Negative for rash.  Neurological: Positive for weakness. Negative for dizziness, syncope, speech difficulty, numbness and headaches.  Hematological: Negative for adenopathy. Does not bruise/bleed easily.  Psychiatric/Behavioral: Negative for behavioral problems and dysphoric mood. The patient is not nervous/anxious.        Objective:   Physical Exam  Constitutional: He is oriented to person, place, and time. He appears well-developed.       Blood pressure 58/60  HENT:  Head: Normocephalic.   Right Ear: External ear normal.  Left Ear: External ear normal.  Eyes: Conjunctivae and EOM are normal.  Neck: Normal range of motion.  Cardiovascular: Normal rate and normal heart sounds.   Pulmonary/Chest: Effort normal and breath sounds normal.       Frequent paroxysms of coughing  Abdominal: Bowel sounds are normal.  Musculoskeletal: Normal range of motion. He exhibits no edema and no tenderness.  Neurological: He is alert and oriented to person, place, and time.  Psychiatric: He has a normal mood and affect. His behavior is normal.          Assessment & Plan:   Acute prostatitis possible UTI. Nice clinical  response to Cipro we'll complete 10 days of therapy Viral URI with cough. We'll treat symptomatically Hypertension will discontinue Lotensin at this time. When blood pressures greater than 130/80 we'll resume the 20 mg dose.

## 2010-12-05 ENCOUNTER — Telehealth: Payer: Self-pay | Admitting: *Deleted

## 2010-12-05 NOTE — Telephone Encounter (Addendum)
Wife called this am and wanted Dr. Kirtland Bouchard to know pt's BP this morning was 74/57, and he is still dizzy.  He went to work.  Is asking what Dr. Kirtland Bouchard recommends?  Ann Maki a hard copy of phone note in case Dr. Kirtland Bouchard wants to see pt again.

## 2010-12-05 NOTE — Telephone Encounter (Signed)
Suggest the patient's stay at home force fluids and rest until his blood pressure is more stable

## 2010-12-05 NOTE — Telephone Encounter (Signed)
Dr. Charm Rings recommendations given to wife and she will call Pt and have him come home from work to follow instructions.

## 2010-12-05 NOTE — Telephone Encounter (Signed)
Suggest the patient stay home from work rest and force fluids with a sports drinks such as Gatorade until blood pressure has stabilized. Hold all blood pressure medications

## 2010-12-11 ENCOUNTER — Other Ambulatory Visit: Payer: Self-pay

## 2010-12-11 MED ORDER — HYDROCODONE-HOMATROPINE 5-1.5 MG/5ML PO SYRP: 5.0000 mL | ORAL_SOLUTION | Freq: Four times a day (QID) | ORAL | Status: DC | PRN

## 2010-12-11 NOTE — Telephone Encounter (Signed)
6 oz 

## 2010-12-11 NOTE — Telephone Encounter (Signed)
Fax refill request for hydromet - last seen 12/04/10  Last written 12/04/10 Please advise

## 2010-12-11 NOTE — Telephone Encounter (Signed)
[facility] 

## 2010-12-14 ENCOUNTER — Ambulatory Visit (INDEPENDENT_AMBULATORY_CARE_PROVIDER_SITE_OTHER): Payer: PRIVATE HEALTH INSURANCE | Admitting: Internal Medicine

## 2010-12-14 ENCOUNTER — Other Ambulatory Visit: Payer: Self-pay | Admitting: Internal Medicine

## 2010-12-14 ENCOUNTER — Encounter: Payer: Self-pay | Admitting: Internal Medicine

## 2010-12-14 ENCOUNTER — Ambulatory Visit (INDEPENDENT_AMBULATORY_CARE_PROVIDER_SITE_OTHER)
Admission: RE | Admit: 2010-12-14 | Discharge: 2010-12-14 | Disposition: A | Discharge status: Home / Self Care | Payer: PRIVATE HEALTH INSURANCE | Source: Ambulatory Visit | Attending: Internal Medicine | Admitting: Internal Medicine

## 2010-12-14 VITALS — BP 120/70 | Temp 99.2°F | Wt 182.0 lb

## 2010-12-14 DIAGNOSIS — I10 Essential (primary) hypertension: Secondary | ICD-10-CM

## 2010-12-14 DIAGNOSIS — J069 Acute upper respiratory infection, unspecified: Secondary | ICD-10-CM

## 2010-12-14 LAB — COMPREHENSIVE METABOLIC PANEL
ALT: 14 U/L (ref 0–53)
Albumin: 3.6 g/dL (ref 3.5–5.2)
CO2: 30 mEq/L (ref 19–32)
Calcium: 8.8 mg/dL (ref 8.4–10.5)
Chloride: 101 mEq/L (ref 96–112)
GFR: 98.32 mL/min (ref >60.00)
Glucose, Bld: 97 mg/dL (ref 70–99)
Potassium: 3.5 mEq/L (ref 3.5–5.1)
Sodium: 139 mEq/L (ref 135–145)
Total Protein: 6.8 g/dL (ref 6.0–8.3)

## 2010-12-14 LAB — CBC WITH DIFFERENTIAL/PLATELET
Basophils Absolute: 0 10*3/uL (ref 0.0–0.1)
Eosinophils Relative: 0.7 % (ref 0.0–5.0)
Lymphs Abs: 0.9 10*3/uL (ref 0.7–4.0)
Monocytes Relative: 8.9 % (ref 3.0–12.0)
Neutrophils Relative %: 80.7 % — ABNORMAL HIGH (ref 43.0–77.0)
Platelets: 193 10*3/uL (ref 150.0–400.0)
RDW: 12.9 % (ref 11.5–14.6)
WBC: 9.1 10*3/uL (ref 4.5–10.5)

## 2010-12-14 MED ORDER — AZITHROMYCIN 250 MG PO TABS: ORAL_TABLET | ORAL | Status: AC

## 2010-12-14 MED ORDER — CEFTRIAXONE SODIUM 1 G IJ SOLR
1.0000 g | Freq: Once | INTRAMUSCULAR | Status: AC
  Administered 2010-12-14: 1 g via INTRAMUSCULAR

## 2010-12-14 NOTE — Progress Notes (Signed)
Quick Note:  Spoke with wife - informed of results - axb , rest and fluids. ______

## 2010-12-14 NOTE — Patient Instructions (Signed)
Get plenty of rest, Drink lots of  clear liquids, and use Tylenol or ibuprofen for fever and discomfort.    Take your antibiotic as prescribed until ALL of it is gone, but stop if you develop a rash, swelling, or any side effects of the medication.  Contact our office as soon as possible if  there are side effects of the medication.  Chest x-ray as discussed

## 2010-12-14 NOTE — Progress Notes (Signed)
  Subjective:    Patient ID: Jose [name], male    DOB: 1950-05-21, 60 y.o.   MRN: [id number]  HPI  60 year old patient who is seen today for evaluation of fever chills and cough. He was seen here on October 1 after having been evaluated and treated at a [facility] emergency room 2 days prior. He present with lower bowel pain back pain and dysuria and was felt to have acute prostatitis. He has completed 10 days of Cipro the symptoms have resolved 2 days ago he finished antibiotic therapy and has had the onset of fever chills. He has had refractory cough. Today he feels worse. Yesterday he did work a 10-11 hour workday. Temperature has been as high as 102 he has refractory largely nonproductive cough he remains weak he has had no further lower abdominal pain or any urinary tract symptoms. He has been using antitussives with marginal benefit he has treated hypertension and has been on ACE inhibition that has been held due to this illness due to cough and low blood pressure readings.    Review of Systems  Constitutional: Positive for fever, appetite change and fatigue. Negative for chills.  HENT: Negative for hearing loss, ear pain, congestion, sore throat, trouble swallowing, neck stiffness, dental problem, voice change and tinnitus.   Eyes: Negative for pain, discharge and visual disturbance.  Respiratory: Positive for cough. Negative for chest tightness, wheezing and stridor.   Cardiovascular: Negative for chest pain, palpitations and leg swelling.  Gastrointestinal: Negative for nausea, vomiting, abdominal pain, diarrhea, constipation, blood in stool and abdominal distention.  Genitourinary: Negative for urgency, hematuria, flank pain, discharge, difficulty urinating and genital sores.  Musculoskeletal: Negative for myalgias, back pain, joint swelling, arthralgias and gait problem.  Skin: Negative for rash.  Neurological: Negative for dizziness, syncope, speech difficulty, weakness, numbness and  headaches.  Hematological: Negative for adenopathy. Does not bruise/bleed easily.  Psychiatric/Behavioral: Negative for behavioral problems and dysphoric mood. The patient is not nervous/anxious.        Objective:   Physical Exam  Constitutional: He is oriented to person, place, and time. He appears well-developed and well-nourished.       Appears unwell but in no acute distress. Blood pressure 120/70 Oxygen saturation 98 with a pulse rate 95 Frequent paroxysms of coughing  HENT:  Head: Normocephalic and atraumatic.  Right Ear: External ear normal.  Left Ear: External ear normal.  Eyes: Conjunctivae and EOM are normal. Pupils are equal, round, and reactive to light.  Neck: Normal range of motion. Neck supple.  Cardiovascular: Normal rate, regular rhythm and normal heart sounds.   Pulmonary/Chest: Effort normal and breath sounds normal. No respiratory distress. He has no wheezes. He has no rales.  Abdominal: Bowel sounds are normal. He exhibits no distension.  Musculoskeletal: Normal range of motion. He exhibits no edema and no tenderness.  Lymphadenopathy:    He has no cervical adenopathy.  Neurological: He is alert and oriented to person, place, and time.  Skin: Skin is warm and dry. No rash noted.  Psychiatric: He has a normal mood and affect. His behavior is normal.          Assessment & Plan:   Acute febrile illness with cough. We'll check screening lab as well as a chest x-ray we'll treat with azithromycin Tylenol Hypertension. We'll continue to hold his ACE inhibition until his acute illness has resolved

## 2010-12-15 ENCOUNTER — Other Ambulatory Visit: Payer: Self-pay | Admitting: Internal Medicine

## 2010-12-15 MED ORDER — HYDROCODONE-HOMATROPINE 5-1.5 MG/5ML PO SYRP: 5.0000 mL | ORAL_SOLUTION | Freq: Four times a day (QID) | ORAL | Status: AC | PRN

## 2010-12-15 NOTE — Telephone Encounter (Signed)
[facility] 

## 2010-12-15 NOTE — Telephone Encounter (Signed)
Pt called regarding prescription for cough meds pt said she has spoke with you yesterday Please contact pt [facility] [place]

## 2010-12-18 ENCOUNTER — Telehealth: Payer: Self-pay | Admitting: Internal Medicine

## 2010-12-18 NOTE — Telephone Encounter (Signed)
Pt was seen on 10-11 dx pneumonia. Pt would like kim to return her call.

## 2010-12-18 NOTE — Telephone Encounter (Signed)
Called and left messages to call back if needed

## 2010-12-18 NOTE — Telephone Encounter (Signed)
Spoke with pt - taking last axb tonight - still feeling fatigued - coughing has made throat raw feeling , feels swollen and it makes it hard to swallow sometimes.  Please advise

## 2010-12-19 ENCOUNTER — Other Ambulatory Visit: Payer: Self-pay | Admitting: Internal Medicine

## 2010-12-19 NOTE — Telephone Encounter (Signed)
Pt called and said that nurse was suppose to be checking with pcp re: getting 2 nd round of abx for pneumonia. Pt still having symptoms. [facility] [place] [place] [facility] [address].

## 2010-12-19 NOTE — Telephone Encounter (Signed)
Per dr. Amador Cunas - will see at end of day with wife - or will send out abx rx with wife.

## 2010-12-26 ENCOUNTER — Other Ambulatory Visit: Payer: Self-pay

## 2010-12-26 MED ORDER — ALPRAZOLAM 0.5 MG PO TABS: 0.5000 mg | ORAL_TABLET | Freq: Three times a day (TID) | ORAL | Status: DC | PRN

## 2010-12-26 NOTE — Telephone Encounter (Signed)
[facility] 

## 2011-01-02 ENCOUNTER — Ambulatory Visit (INDEPENDENT_AMBULATORY_CARE_PROVIDER_SITE_OTHER): Payer: PRIVATE HEALTH INSURANCE | Admitting: Family Medicine

## 2011-01-02 ENCOUNTER — Ambulatory Visit: Payer: PRIVATE HEALTH INSURANCE | Admitting: Family Medicine

## 2011-01-02 DIAGNOSIS — Z23 Encounter for immunization: Secondary | ICD-10-CM

## 2011-01-08 ENCOUNTER — Other Ambulatory Visit: Payer: Self-pay

## 2011-01-08 MED ORDER — HYDROCODONE-HOMATROPINE 5-1.5 MG/5ML PO SYRP: 5.0000 mL | ORAL_SOLUTION | Freq: Four times a day (QID) | ORAL | Status: AC | PRN

## 2011-01-08 NOTE — Telephone Encounter (Signed)
Cough med called in to [facility]

## 2011-02-02 ENCOUNTER — Other Ambulatory Visit: Payer: Self-pay | Admitting: Internal Medicine

## 2011-02-15 ENCOUNTER — Telehealth: Payer: Self-pay | Admitting: Internal Medicine

## 2011-02-15 MED ORDER — HYDROCHLOROTHIAZIDE 25 MG PO TABS: 25.0000 mg | ORAL_TABLET | Freq: Every day | ORAL | Status: DC

## 2011-02-15 NOTE — Telephone Encounter (Signed)
Pls advise.  

## 2011-02-15 NOTE — Telephone Encounter (Signed)
Pt called and said that his bp has been elevated on morning dose of bp med. Pt is wanting to know if he should take evening dose as well? Pls advise.

## 2011-02-15 NOTE — Telephone Encounter (Signed)
Continue Lotensin 40 mg daily. Add hydrochlorothiazide 25 mg once daily. Please call and #[phone] refill x2

## 2011-02-15 NOTE — Telephone Encounter (Signed)
Left a message for pt to return call 

## 2011-02-15 NOTE — Telephone Encounter (Signed)
rx sent to pharmacy.  Pt is aware of new instructions.

## 2011-02-21 ENCOUNTER — Other Ambulatory Visit: Payer: Self-pay | Admitting: Internal Medicine

## 2011-03-13 ENCOUNTER — Other Ambulatory Visit: Payer: Self-pay | Admitting: Internal Medicine

## 2011-08-07 ENCOUNTER — Other Ambulatory Visit: Payer: Self-pay | Admitting: Internal Medicine

## 2011-08-18 ENCOUNTER — Other Ambulatory Visit: Payer: Self-pay | Admitting: Internal Medicine

## 2011-09-15 ENCOUNTER — Other Ambulatory Visit: Payer: Self-pay | Admitting: Internal Medicine

## 2011-10-28 ENCOUNTER — Other Ambulatory Visit: Payer: Self-pay | Admitting: Internal Medicine

## 2011-10-31 ENCOUNTER — Encounter: Payer: Self-pay | Admitting: Internal Medicine

## 2011-10-31 ENCOUNTER — Ambulatory Visit (INDEPENDENT_AMBULATORY_CARE_PROVIDER_SITE_OTHER): Payer: PRIVATE HEALTH INSURANCE | Admitting: Internal Medicine

## 2011-10-31 VITALS — BP 120/80 | Temp 97.9°F | Wt 200.0 lb

## 2011-10-31 DIAGNOSIS — I10 Essential (primary) hypertension: Secondary | ICD-10-CM

## 2011-10-31 DIAGNOSIS — K4090 Unilateral inguinal hernia, without obstruction or gangrene, not specified as recurrent: Secondary | ICD-10-CM

## 2011-10-31 MED ORDER — VARDENAFIL HCL 20 MG PO TABS: 20.0000 mg | ORAL_TABLET | ORAL | Status: DC | PRN

## 2011-10-31 NOTE — Progress Notes (Signed)
Subjective:    Patient ID: Jose [name], male    DOB: Nov 07, 1950, 61 y.o.   MRN: [id number]  HPI  61 year old patient who has a history of treated hypertension who presents with a four-day history of a area of swelling in his right groin area. This area has been mildly tender. He also complains of some ED concerns  Past Medical History  Diagnosis Date  . FACIAL PARESTHESIA, RIGHT 07/27/2008  . CAD 07/27/2008  . HYPERTENSION 09/18/2006  . GERD 09/18/2006  . 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  . GLUCOSE INTOLERANCE 01/08/2008  . ANXIETY 07/27/2008  . ALCOHOL ABUSE 07/27/2008  . KNEE PAIN, ACUTE 04/07/2007  . SEIZURE DISORDER 09/18/2006  . LOW BACK PAIN 09/18/2006  . Rosacea 07/27/2008  . Hx of adenomatous colonic polyps     History   Social History  . Marital Status: Married    Spouse Name: N/A    Number of Children: N/A  . Years of Education: N/A   Occupational History  . Not on file.   Social History Main Topics  . Smoking status: Never Smoker   . Smokeless tobacco: Former Neurosurgeon    Types: Chew  . Alcohol Use: Yes     2-3 daily and sometimes binge drinks on the weekend  . Drug Use: No  . Sexually Active: Not on file   Other Topics Concern  . Not on file   Social History Narrative  . No narrative on file    Past Surgical History  Procedure Date  . Knee surgery   . Coronary stent placement     drug eluting    Family History  Problem Relation Age of Onset  . Lung cancer    . Emphysema    . Hypertension    . Heart attack      No Known Allergies  Current Outpatient Prescriptions on File Prior to Visit  Medication Sig Dispense Refill  . ALPRAZolam (XANAX) 0.5 MG tablet Take 1 tablet (0.5 mg total) by mouth 3 (three) times daily as needed.  90 tablet  2  . aspirin 81 MG tablet Take 81 mg by mouth daily.        . Azelaic Acid (FINACEA) 15 % cream as directed. After skin is thoroughly washed and patted dry, gently but thoroughly massage a thin film of azelaic acid cream into  the affected area twice daily, in the morning and evening.       . benazepril (LOTENSIN) 40 MG tablet TAKE 1 TABLET (40 MG TOTAL) BY MOUTH DAILY.  90 tablet  0  . clopidogrel (PLAVIX) 75 MG tablet TAKE 1 TABLET (75 MG TOTAL) BY MOUTH DAILY.  30 tablet  3  . halobetasol (ULTRAVATE) 0.05 % cream as directed.        . hydrochlorothiazide (HYDRODIURIL) 25 MG tablet Take 1 tablet (25 mg total) by mouth daily.  90 tablet  2  . NITROSTAT 0.4 MG SL tablet TAKE AS NEEDED  25 tablet  11  . pantoprazole (PROTONIX) 40 MG tablet TAKE 1 TABLET BY MOUTH EVERY DAY  90 tablet  3  . pravastatin (PRAVACHOL) 20 MG tablet TAKE 1 TABLET (20 MG TOTAL) BY MOUTH DAILY.  90 tablet  1  . vitamin C (ASCORBIC ACID) 500 MG tablet Take 500 mg by mouth daily.        . vardenafil (LEVITRA) 20 MG tablet Take 1 tablet (20 mg total) by mouth as needed for erectile dysfunction.  20 tablet  1  BP 120/80  Temp 97.9 F (36.6 C) (Oral)  Wt 200 lb (90.719 kg)       Review of Systems  Gastrointestinal: Positive for abdominal pain.       Objective:   Physical Exam  Abdominal: Soft. Bowel sounds are normal. He exhibits mass.       Moderate size right inguinal hernia noted          Assessment & Plan:   Right inguinal hernia. Mildly symptomatic. Information was dispensed and situation discussed. He will call if he develops worsening pain enlargement of the hernia in any way is lifestyle limiting. He will be set up for surgical referral at that time ED. Trial of Levitra Hypertension stable   CPX 3 months

## 2011-10-31 NOTE — Patient Instructions (Addendum)
Hernia, Surgical Repair A hernia occurs when an internal organ pushes out through a weak spot in the belly (abdominal) wall muscles. Hernias commonly occur in the groin and around the navel. Hernias often can be pushed back into place (reduced). Most hernias tend to get worse over time. Problems occur when abdominal contents get stuck in the opening (incarcerated hernia). The blood supply gets cut off (strangulated hernia). This is an emergency and needs surgery. Otherwise, hernia repair can be an elective procedure. This means you can schedule this at your convenience when an emergency is not present. Because complications can occur, if you decide to repair the hernia, it is best to do it soon. When it becomes an emergency procedure, there is increased risk of complications after surgery. CAUSES    Heavy lifting.   Obesity.   Prolonged coughing.   Straining to move your bowels.   Hernias can also occur through a cut (incision) by a surgeonafter an abdominal operation.  HOME CARE INSTRUCTIONS Before the repair:  Bed rest is not required. You may continue your normal activities, but avoid heavy lifting (more than 10 pounds) or straining. Cough gently. If you are a smoker, it is best to stop. Even the best hernia repair can break down with the continual strain of coughing.   Do not wear anything tight over your hernia. Do not try to keep it in with an outside bandage or truss. These can damage abdominal contents if they are trapped in the hernia sac.   Eat a normal diet. Avoid constipation. Straining over long periods of time to have a bowel movement will increase hernia size. It also can breakdown repairs. If you cannot do this with diet alone, laxatives or stool softeners may be used.  PRIOR TO SURGERY, SEEK IMMEDIATE MEDICAL CARE IF: You have problems (symptoms) of a trapped (incarcerated) hernia. Symptoms include:  An oral temperature above 102 F (38.9 C) develops, or as your caregiver  suggests.   Increasing abdominal pain.   Feeling sick to your stomach(nausea) and vomiting.   You stop passing gas or stool.   The hernia is stuck outside the abdomen, looks discolored, feels hard, or is tender.   You have any changes in your bowel habits or in the hernia that is unusual for you.  LET YOUR CAREGIVERS KNOW ABOUT THE FOLLOWING:  Allergies.   Medications taken including herbs, eye drops, over the counter medications, and creams.   Use of steroids (by mouth or creams).   Family or personal history of problems with anesthetics or Novocaine.   Possibility of pregnancy, if this applies.   Personal history of blood clots (thrombophlebitis).   Family or personal history of bleeding or blood problems.   Previous surgery.   Other health problems.  BEFORE THE PROCEDURE You should be present 1 hour prior to your procedure, or as directed by your caregiver.   AFTER THE PROCEDURE After surgery, you will be taken to the recovery area. A nurse will watch and check your progress there. Once you are awake, stable, and taking fluids well, you will be allowed to go home as long as there are no problems. Once home, an ice pack (wrapped in a light towel) applied to your operative site may help with discomfort. It may also keep the swelling down. Do not lift anything heavier than 10 pounds (4.55 kilograms). Take showers not baths. Do not drive while taking narcotics. Follow instructions as suggested by your caregiver.   SEEK IMMEDIATE MEDICAL  CARE IF: After surgery:  There is redness, swelling, or increasing pain in the wound.   There is pus coming from the wound.   There is drainage from a wound lasting longer than 1 day.   An unexplained oral temperature above 102 F (38.9 C) develops.   You notice a foul smell coming from the wound or dressing.   There is a breaking open of a wound (edged not staying together) after the sutures have been removed.   You notice increasing  pain in the shoulders (shoulder strap areas).   You develop dizzy episodes or fainting while standing.   You develop persistent nausea or vomiting.   You develop a rash.   You have difficulty breathing.   You develop any reaction or side effects to medications given.  MAKE SURE YOU:    Understand these instructions.   Will watch your condition.   Will get help right away if you are not doing well or get worse.  Document Released: 08/15/2000 Document Revised: 02/08/2011 Document Reviewed: 07/08/2007 [facility] Patient Information 2012 [place], [place].Inguinal Hernia, Adult Muscles help keep everything in the body in its proper place. But if a weak spot in the muscles develops, something can poke through. That is called a hernia. When this happens in the lower part of the belly (abdomen), it is called an inguinal hernia. (It takes its name from a part of the body in this region called the inguinal canal.) A weak spot in the wall of muscles lets some fat or part of the small intestine bulge through. An inguinal hernia can develop at any age. Men get them more often than women. CAUSES   In adults, an inguinal hernia develops over time.  It can be triggered by:   Suddenly straining the muscles of the lower abdomen.   Lifting heavy objects.   Straining to have a bowel movement. Difficult bowel movements (constipation) can lead to this.   Constant coughing. This may be caused by smoking or lung disease.   Being overweight.   Being pregnant.   Working at a job that requires long periods of standing or heavy lifting.   Having had an inguinal hernia before.  One type can be an emergency situation. It is called a strangulated inguinal hernia. It develops if part of the small intestine slips through the weak spot and cannot get back into the abdomen. The blood supply can be cut off. If that happens, part of the intestine may die. This situation requires emergency surgery. SYMPTOMS     Often, a small inguinal hernia has no symptoms. It is found when a healthcare provider does a physical exam. Larger hernias usually have symptoms.    In adults, symptoms may include:   A lump in the groin. This is easier to see when the person is standing. It might disappear when lying down.   In men, a lump in the scrotum.   Pain or burning in the groin. This occurs especially when lifting, straining or coughing.   A dull ache or feeling of pressure in the groin.   Signs of a strangulated hernia can include:   A bulge in the groin that becomes very painful and tender to the touch.   A bulge that turns red or purple.   Fever, nausea and vomiting.   Inability to have a bowel movement or to pass gas.  DIAGNOSIS   To decide if you have an inguinal hernia, a healthcare provider will probably do a  physical examination.  This will include asking questions about any symptoms you have noticed.   The healthcare provider might feel the groin area and ask you to cough. If an inguinal hernia is felt, the healthcare provider may try to slide it back into the abdomen.   Usually no other tests are needed.  TREATMENT   Treatments can vary. The size of the hernia makes a difference. Options include:  Watchful waiting. This is often suggested if the hernia is small and you have had no symptoms.   No medical procedure will be done unless symptoms develop.   You will need to watch closely for symptoms. If any occur, contact your healthcare provider right away.   Surgery. This is used if the hernia is larger or you have symptoms.   Open surgery. This is usually an outpatient procedure (you will not stay overnight in a hospital). An cut (incision) is made through the skin in the groin. The hernia is put back inside the abdomen. The weak area in the muscles is then repaired by herniorrhaphy or hernioplasty. Herniorrhaphy: in this type of surgery, the weak muscles are sewn back together.  Hernioplasty: a patch or mesh is used to close the weak area in the abdominal wall.   Laparoscopy. In this procedure, a surgeon makes small incisions. A thin tube with a tiny video camera (called a laparoscope) is put into the abdomen. The surgeon repairs the hernia with mesh by looking with the video camera and using two long instruments.  HOME CARE INSTRUCTIONS    After surgery to repair an inguinal hernia:   You will need to take pain medicine prescribed by your healthcare provider. Follow all directions carefully.   You will need to take care of the wound from the incision.   Your activity will be restricted for awhile. This will probably include no heavy lifting for several weeks. You also should not do anything too active for a few weeks. When you can return to work will depend on the type of job that you have.   During "watchful waiting" periods, you should:   Maintain a healthy weight.   Eat a diet high in fiber (fruits, vegetables and whole grains).   Drink plenty of fluids to avoid constipation. This means drinking enough water and other liquids to keep your urine clear or pale yellow.   Do not lift heavy objects.   Do not stand for long periods of time.   Quit smoking. This should keep you from developing a frequent cough.  SEEK MEDICAL CARE IF:    A bulge develops in your groin area.   You feel pain, a burning sensation or pressure in the groin. This might be worse if you are lifting or straining.   You develop a fever of more than 100.5 F (38.1 C).  SEEK IMMEDIATE MEDICAL CARE IF:    Pain in the groin increases suddenly.   A bulge in the groin gets bigger suddenly and does not go down.   For men, there is sudden pain in the scrotum. Or, the size of the scrotum increases.   A bulge in the groin area becomes red or purple and is painful to touch.   You have nausea or vomiting that does not go away.   You feel your heart beating much faster than normal.    You cannot have a bowel movement or pass gas.   You develop a fever of more than 102.0 F (38.9 C).  Document Released: 07/08/2008 Document Revised: 02/08/2011 Document Reviewed: 07/08/2008 [facility] Patient Information 2012 [place], [place].

## 2011-11-06 ENCOUNTER — Telehealth: Payer: Self-pay | Admitting: Family Medicine

## 2011-11-06 MED ORDER — TADALAFIL 20 MG PO TABS: 20.0000 mg | ORAL_TABLET | Freq: Every day | ORAL | Status: DC | PRN

## 2011-11-06 NOTE — Telephone Encounter (Signed)
Patient's PA for Levitra will not go through, because it is not preferred rx. Preferred for his insurance are VIAGRA & CIALIS. Please advise as to possible change of med? Thanks.

## 2011-11-06 NOTE — Telephone Encounter (Signed)
cialis 20  #12  RF 6

## 2011-11-06 NOTE — Telephone Encounter (Signed)
Please advise 

## 2011-11-06 NOTE — Telephone Encounter (Signed)
New rx done

## 2011-11-12 ENCOUNTER — Telehealth: Payer: Self-pay | Admitting: Internal Medicine

## 2011-11-12 DIAGNOSIS — K4090 Unilateral inguinal hernia, without obstruction or gangrene, not specified as recurrent: Secondary | ICD-10-CM

## 2011-11-12 NOTE — Telephone Encounter (Signed)
Pt called and is needing a referral re: hernia as previously discussed with Dr Amador Cunas.

## 2011-11-12 NOTE — Telephone Encounter (Signed)
Please advise 

## 2011-11-12 NOTE — Telephone Encounter (Signed)
Please refer to general surgery for treatment of a symptomatic right inguinal hernia

## 2011-11-12 NOTE — Telephone Encounter (Signed)
Order done

## 2011-11-16 ENCOUNTER — Encounter: Payer: Self-pay | Admitting: Gastroenterology

## 2011-11-20 ENCOUNTER — Ambulatory Visit (INDEPENDENT_AMBULATORY_CARE_PROVIDER_SITE_OTHER): Payer: PRIVATE HEALTH INSURANCE | Admitting: General Surgery

## 2011-11-26 ENCOUNTER — Ambulatory Visit (INDEPENDENT_AMBULATORY_CARE_PROVIDER_SITE_OTHER): Payer: PRIVATE HEALTH INSURANCE | Admitting: General Surgery

## 2011-11-27 ENCOUNTER — Telehealth (INDEPENDENT_AMBULATORY_CARE_PROVIDER_SITE_OTHER): Payer: Self-pay | Admitting: General Surgery

## 2011-11-27 NOTE — Telephone Encounter (Signed)
Called patient to apologize for his not being seen in clinic yesterday (was running behind). Offered patient to be seen by Dr. Derrell Lolling tomorrow at 2:45 due to cancellation. The patient was already rescheduled to see Dr. Derrell Lolling. Patient said he would not be able to see Dr. Derrell Lolling on 9/25 due to classes and would rather keep the appointment to see Dr. Derrell Lolling.

## 2011-11-30 ENCOUNTER — Ambulatory Visit (INDEPENDENT_AMBULATORY_CARE_PROVIDER_SITE_OTHER): Payer: PRIVATE HEALTH INSURANCE | Admitting: General Surgery

## 2011-11-30 ENCOUNTER — Encounter (INDEPENDENT_AMBULATORY_CARE_PROVIDER_SITE_OTHER): Payer: Self-pay | Admitting: General Surgery

## 2011-11-30 VITALS — BP 121/75 | HR 82 | Temp 97.1°F | Resp 16 | Ht 68.0 in | Wt 201.6 lb

## 2011-11-30 DIAGNOSIS — K4090 Unilateral inguinal hernia, without obstruction or gangrene, not specified as recurrent: Secondary | ICD-10-CM | POA: Insufficient documentation

## 2011-11-30 NOTE — Progress Notes (Signed)
Subjective:     Patient ID: Jose [name], male   DOB: Dec 06, 1950, 61 y.o.   MRN: [id number]  HPI This patient is a 61 year old male with a several week history of any right inguinal hernia. Patient states he noticed the bulge after Labor Day holiday has noticed pain in the area since then. Patient states she notices a bulge after longus tendon. Patient states that he has minimal bulge in his right groin in the morning after awakening.  The patient recalls no inciting factors and no bleeding sites at this time.  Review of Systems  Constitutional: Negative.   Eyes: Negative.   Cardiovascular: Negative.   Gastrointestinal: Negative.   Musculoskeletal: Negative.        Objective:   Physical Exam  Constitutional: He is oriented to person, place, and time. He appears well-nourished.  HENT:  Head: Normocephalic and atraumatic.  Eyes: Conjunctivae normal are normal. Pupils are equal, round, and reactive to light.  Neck: Normal range of motion. Neck supple.  Cardiovascular: Normal rate and regular rhythm.   Pulmonary/Chest: Effort normal.  Abdominal: Soft. A hernia is present. Hernia confirmed positive in the right inguinal area.  Musculoskeletal: Normal range of motion.  Neurological: He is alert and oriented to person, place, and time.       Assessment:     The patient is a 61 year old male with a right inguinal hernia repair. 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 We discussed the options of laparoscopic and open hernia patient states he would like to proceed with a laparoscopic repair. She will require cardiac evaluation to be off Plavix prior to scheduling for the hernia repair.    Plan:     1. We'll schedule left versus open inguinal repair after evaluation by cardiologist.  2.  All risks and benefits were discussed with the patient, to generally include infection, bleeding, damage to surrounding structures, and recurrence. Alternatives  were offered and described.  All questions were answered and the patient voiced understanding of the procedure and wishes to proceed at this point.

## 2011-12-07 ENCOUNTER — Ambulatory Visit (INDEPENDENT_AMBULATORY_CARE_PROVIDER_SITE_OTHER): Payer: PRIVATE HEALTH INSURANCE | Admitting: Physician Assistant

## 2011-12-07 ENCOUNTER — Encounter: Payer: Self-pay | Admitting: Physician Assistant

## 2011-12-07 ENCOUNTER — Other Ambulatory Visit: Payer: Self-pay | Admitting: Internal Medicine

## 2011-12-07 VITALS — BP 126/80 | HR 62 | Ht 68.5 in | Wt 200.4 lb

## 2011-12-07 DIAGNOSIS — E785 Hyperlipidemia, unspecified: Secondary | ICD-10-CM

## 2011-12-07 DIAGNOSIS — I2510 Atherosclerotic heart disease of native coronary artery without angina pectoris: Secondary | ICD-10-CM

## 2011-12-07 DIAGNOSIS — Z01810 Encounter for preprocedural cardiovascular examination: Secondary | ICD-10-CM

## 2011-12-07 NOTE — Progress Notes (Signed)
[address] [place], [place]  [address] Phone: [phone] Fax:  [phone]  Date:  12/07/2011   Name:  Jose [name]   DOB:  May 19, 1950   MRN:  [id number]  PCP:  Rogelia Boga, MD  Primary Cardiologist:  Dr. Arvilla Meres (will establish with Dr. Tonny Bollman in follow up in one year) Primary Electrophysiologist:  None    History of Present Illness: Jose [name] is a 61 y.o. male who returns for surgical clearance.    He has a hx of CAD, s/p Endeavor DES to the CFX in 07/2008.  He had residual 70% stenosis in the OM1.  Follow up cath a few weeks after PCI demonstrated patent stent and EF 45%.  Other hx includes HTN, HL, GERD, bifascicular block with RBBB/LAFB.  He last saw Dr. Arvilla Meres in 06/2010.  He now needs right inguinal hernia repair with Dr. Axel Filler with [facility].  Patient is doing well.  The patient denies chest pain, shortness of breath, syncope, orthopnea, PND or significant pedal edema.  He can achieve > 4 METs without chest pain or dyspnea.    Wt Readings from Last 3 Encounters:  12/07/11 200 lb 6.4 oz (90.901 kg)  11/30/11 201 lb 9.6 oz (91.445 kg)  10/31/11 200 lb (90.719 kg)     Past Medical History  Diagnosis Date  . History of TIA (transient ischemic attack)   . CAD 07/27/2008    a. s/p Endeavor DES to CFX 07/2008; residual OM1 70%, EF 45%;  b. relook cath 5/10: patent stent in CFX  . HYPERTENSION 09/18/2006  . GERD 09/18/2006  . 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  . GLUCOSE INTOLERANCE 01/08/2008  . ANXIETY 07/27/2008  . ALCOHOL ABUSE 07/27/2008  . SEIZURE DISORDER 09/18/2006  . LOW BACK PAIN 09/18/2006  . Rosacea 07/27/2008  . Hx of adenomatous colonic polyps   . Bifascicular block     beta blocker stopped due to profound bradycardia    Current Outpatient Prescriptions  Medication Sig Dispense Refill  . ALPRAZolam (XANAX) 0.5 MG tablet Take 1 tablet (0.5 mg total) by mouth 3 (three) times daily as needed.  90 tablet  2   . aspirin 81 MG tablet Take 81 mg by mouth daily.        . Azelaic Acid (FINACEA) 15 % cream as directed. After skin is thoroughly washed and patted dry, gently but thoroughly massage a thin film of azelaic acid cream into the affected area twice daily, in the morning and evening.       . benazepril (LOTENSIN) 40 MG tablet TAKE 1 TABLET (40 MG TOTAL) BY MOUTH DAILY.  90 tablet  0  . clopidogrel (PLAVIX) 75 MG tablet TAKE 1 TABLET (75 MG TOTAL) BY MOUTH DAILY.  30 tablet  3  . halobetasol (ULTRAVATE) 0.05 % cream as directed.        . hydrochlorothiazide (HYDRODIURIL) 25 MG tablet Take 1 tablet (25 mg total) by mouth daily.  90 tablet  2  . NITROSTAT 0.4 MG SL tablet TAKE AS NEEDED  25 tablet  11  . pantoprazole (PROTONIX) 40 MG tablet TAKE 1 TABLET BY MOUTH EVERY DAY  90 tablet  3  . pravastatin (PRAVACHOL) 20 MG tablet TAKE 1 TABLET (20 MG TOTAL) BY MOUTH DAILY.  90 tablet  3  . vitamin C (ASCORBIC ACID) 500 MG tablet Take 500 mg by mouth daily.        Marland Kitchen DISCONTD: pravastatin (PRAVACHOL) 20 MG tablet  TAKE 1 TABLET (20 MG TOTAL) BY MOUTH DAILY.  90 tablet  1    Allergies: No Known Allergies  History  Substance Use Topics  . Smoking status: Never Smoker   . Smokeless tobacco: Former Neurosurgeon    Types: Chew  . Alcohol Use: Yes     2-3 daily and sometimes binge drinks on the weekend     ROS:  Please see the history of present illness.     All other systems reviewed and negative.   PHYSICAL EXAM: VS:  BP 126/80  Pulse 62  Ht 5' 8.5" (1.74 m)  Wt 200 lb 6.4 oz (90.901 kg)  BMI 30.03 kg/m2 Well nourished, well developed, in no acute distress HEENT: normal Neck: no JVD Cardiac:  normal S1, S2; RRR; no murmur Lungs:  clear to auscultation bilaterally, no wheezing, rhonchi or rales Abd: soft, nontender, no hepatomegaly Ext: no edema Skin: warm and dry Neuro:  CNs 2-12 intact, no focal abnormalities noted  EKG:  NSR, HR 62, LAFB, RBBB, no change from prior tracing      ASSESSMENT  AND PLAN:  1. Coronary Artery Disease:  Doing well.  No angina.  He needs inguinal herniorrhaphy.  He does not have any unstable cardiac conditions.  He can achieve 4 METs or greater without anginal symptoms.  According to [facility] and AHA guidelines, he requires no further cardiac workup prior to his noncardiac surgery.  The patient should be at acceptable risk.  Our service is available as necessary in the perioperative period.  No beta blocker will be used with his hx of bradycardia.  With hx of DES in his CFX, we recommend he remain on ASA for his surgery.  He can hold his Plavix and restart after surgery when safe.    2. Hypertension:  Controlled.  Continue current therapy.   3. Hyperlipidemia:  Managed by PCP.   4. Dispo:  Dr. Tonny Bollman put his stent in in 2010.  I will have him see Dr. Excell Seltzer for long term follow up in 1 year.    Signed, Tereso Newcomer, PA-C  2:00 PM 12/07/2011

## 2011-12-07 NOTE — Patient Instructions (Addendum)
Your physician wants you to follow-up in: ONE YEAR WITH DR. Theodoro Parma will receive a reminder letter in the mail two months in advance. If you don't receive a letter, please call our office to schedule the follow-up appointment.   Your physician recommends that you continue on your current medications as directed. Please refer to the Current Medication list given to you today.   ANY QUESTIONS OR CONCERNS PLEASE CALL OUR OFFICE AT [phone]

## 2011-12-21 ENCOUNTER — Ambulatory Visit (INDEPENDENT_AMBULATORY_CARE_PROVIDER_SITE_OTHER): Payer: PRIVATE HEALTH INSURANCE | Admitting: General Surgery

## 2012-01-01 NOTE — Progress Notes (Signed)
Need MD order entry in Epic for PST visit 01-07-12. W. Kennon Portela

## 2012-01-02 ENCOUNTER — Encounter (HOSPITAL_COMMUNITY): Payer: Self-pay | Admitting: Pharmacy Technician

## 2012-01-03 ENCOUNTER — Other Ambulatory Visit (INDEPENDENT_AMBULATORY_CARE_PROVIDER_SITE_OTHER): Payer: Self-pay | Admitting: General Surgery

## 2012-01-04 NOTE — Patient Instructions (Addendum)
20 Jose [name]  01/04/2012   Your procedure is scheduled on: 01-14-2012   Report to [facility] at 0630  AM.  Call this number if you have problems the morning of surgery: [phone]   Remember:DRIVER FOR SURGERY AND SOMEONE TO STAY WITH YOU FOR 24 HOURS shelby wife cell [phone].  Call dr Derrell Lolling office and check about stopping plavix and aspirin phone number [phone]. No chewing tobacco day of surgery.  Do not eat food or drink liquids:After Midnight.  .  Take these medicines the morning of surgery with A SIP OF WATER: pantaprazole.   Do not wear jewelry or make up.  Do not wear lotions, powders, or perfumes. You may wear deodorant.    Do not bring valuables to the hospital.  Contacts, dentures or bridgework may not be worn into surgery.  Leave suitcase in the car. After surgery it may be brought to your room.  For patients admitted to the hospital, checkout time is 11:00 AM the day of discharge                             Patients discharged the day of surgery will not be allowed to drive home. If going home same day of surgery, you must have someone stay with you the first 24 hours at home and arrange for some one to drive you home from hospital.    Special Instructions: See [facility] Preparing for Surgery instruction sheet. Women do not shave legs or underarms for 12 hours before showers. Men may shave face morning of surgery.    Please read over the following fact sheets that you were given: MRSA Information  [facility] pre op nurse phone number [phone], call if needed

## 2012-01-07 ENCOUNTER — Encounter (HOSPITAL_COMMUNITY): Payer: Self-pay

## 2012-01-07 ENCOUNTER — Encounter: Payer: Self-pay | Admitting: Internal Medicine

## 2012-01-07 ENCOUNTER — Encounter (HOSPITAL_COMMUNITY)
Admission: RE | Admit: 2012-01-07 | Discharge: 2012-01-07 | Disposition: A | Discharge status: Home / Self Care | Payer: PRIVATE HEALTH INSURANCE | Source: Ambulatory Visit | Attending: General Surgery | Admitting: General Surgery

## 2012-01-07 ENCOUNTER — Ambulatory Visit (INDEPENDENT_AMBULATORY_CARE_PROVIDER_SITE_OTHER): Payer: PRIVATE HEALTH INSURANCE | Admitting: Internal Medicine

## 2012-01-07 ENCOUNTER — Ambulatory Visit (HOSPITAL_COMMUNITY)
Admission: RE | Admit: 2012-01-07 | Discharge: 2012-01-07 | Disposition: A | Discharge status: Home / Self Care | Payer: PRIVATE HEALTH INSURANCE | Source: Ambulatory Visit | Attending: General Surgery | Admitting: General Surgery

## 2012-01-07 VITALS — BP 128/80 | Temp 100.0°F | Wt 202.0 lb

## 2012-01-07 DIAGNOSIS — K4090 Unilateral inguinal hernia, without obstruction or gangrene, not specified as recurrent: Secondary | ICD-10-CM

## 2012-01-07 DIAGNOSIS — K469 Unspecified abdominal hernia without obstruction or gangrene: Secondary | ICD-10-CM | POA: Insufficient documentation

## 2012-01-07 DIAGNOSIS — R300 Dysuria: Secondary | ICD-10-CM

## 2012-01-07 DIAGNOSIS — I10 Essential (primary) hypertension: Secondary | ICD-10-CM

## 2012-01-07 HISTORY — DX: Paresthesia of skin: R20.2

## 2012-01-07 HISTORY — DX: Anesthesia of skin: R20.0

## 2012-01-07 HISTORY — DX: Urinary tract infection, site not specified: N39.0

## 2012-01-07 LAB — CBC
MCHC: 35.2 g/dL (ref 30.0–36.0)
MCV: 91.3 fL (ref 78.0–100.0)
Platelets: 130 10*3/uL — ABNORMAL LOW (ref 150–400)
RDW: 13 % (ref 11.5–15.5)
WBC: 17.8 10*3/uL — ABNORMAL HIGH (ref 4.0–10.5)

## 2012-01-07 LAB — BASIC METABOLIC PANEL
Chloride: 100 mEq/L (ref 96–112)
Creatinine, Ser: 0.91 mg/dL (ref 0.50–1.35)
GFR calc Af Amer: >90 mL/min (ref >90)
GFR calc non Af Amer: >90 mL/min (ref >90)
Potassium: 3.5 mEq/L (ref 3.5–5.1)

## 2012-01-07 LAB — POCT URINALYSIS DIPSTICK
Bilirubin, UA: NEGATIVE
Glucose, UA: NEGATIVE
Spec Grav, UA: 1.025

## 2012-01-07 LAB — SURGICAL PCR SCREEN: Staphylococcus aureus: NEGATIVE

## 2012-01-07 MED ORDER — CEFTRIAXONE SODIUM 500 MG IJ SOLR
500.0000 mg | Freq: Once | INTRAMUSCULAR | Status: AC
  Administered 2012-01-07: 500 mg via INTRAMUSCULAR

## 2012-01-07 MED ORDER — CIPROFLOXACIN HCL 500 MG PO TABS: 500.0000 mg | ORAL_TABLET | Freq: Two times a day (BID) | ORAL | Status: DC

## 2012-01-07 NOTE — Patient Instructions (Signed)
Drink as much fluid as you  can tolerate over the next few days  Take your antibiotic as prescribed until ALL of it is gone, but stop if you develop a rash, swelling, or any side effects of the medication.  Contact our office as soon as possible if  there are side effects of the medication.   

## 2012-01-07 NOTE — Progress Notes (Signed)
Subjective:    Patient ID: Jose [name], male    DOB: [birth year], 61 y.o.   MRN: [id number]  HPI  61 year old patient who presents with one-day history of fever burning dysuria and lower bowel discomfort. No prior history of UTIs. He is scheduled for elective hernia repair in 7 days. He has treated hypertension which has been stable.  Past Medical History  Diagnosis Date  . History of TIA (transient ischemic attack)   . CAD 07/27/2008    a. s/p Endeavor DES to CFX 07/2008; residual OM1 70%, EF 45%;  b. relook cath 5/10: patent stent in CFX  . HYPERTENSION 09/18/2006  . GERD 09/18/2006  . 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  . GLUCOSE INTOLERANCE 01/08/2008  . ANXIETY 07/27/2008  . ALCOHOL ABUSE 07/27/2008  . SEIZURE DISORDER 09/18/2006  . LOW BACK PAIN 09/18/2006  . Rosacea 07/27/2008  . Hx of adenomatous colonic polyps   . Bifascicular block     beta blocker stopped due to profound bradycardia    History   Social History  . Marital Status: Married    Spouse Name: N/A    Number of Children: N/A  . Years of Education: N/A   Occupational History  . Not on file.   Social History Main Topics  . Smoking status: Never Smoker   . Smokeless tobacco: Former Neurosurgeon    Types: Chew  . Alcohol Use: Yes     Comment: 2-3 daily and sometimes binge drinks on the weekend  . Drug Use: No  . Sexually Active: Not on file   Other Topics Concern  . Not on file   Social History Narrative  . No narrative on file    Past Surgical History  Procedure Date  . Knee surgery   . Coronary stent placement     drug eluting    Family History  Problem Relation Age of Onset  . Lung cancer    . Emphysema    . Hypertension    . Heart attack      No Known Allergies  Current Outpatient Prescriptions on File Prior to Visit  Medication Sig Dispense Refill  . ALPRAZolam (XANAX) 0.5 MG tablet Take 0.5 mg by mouth 3 (three) times daily as needed. For anxiety      . aspirin 81 MG tablet Take 81 mg by mouth every  morning.       . Azelaic Acid (FINACEA) 15 % cream Apply 1 application topically as directed. After skin is thoroughly washed and patted dry, gently but thoroughly massage a thin film of azelaic acid cream into the affected area twice daily, in the morning and evening. Applied to face.      . benazepril (LOTENSIN) 40 MG tablet Take 40 mg by mouth every morning.      . clopidogrel (PLAVIX) 75 MG tablet Take 75 mg by mouth every morning.      . diclofenac sodium (VOLTAREN) 1 % GEL Apply 2 g topically 2 (two) times daily as needed. Apply to painful areas around knees      . halobetasol (ULTRAVATE) 0.05 % cream Apply 1 application topically as directed.       . hydrochlorothiazide (HYDRODIURIL) 25 MG tablet Take 1 tablet (25 mg total) by mouth daily.  90 tablet  2  . Multiple Vitamin (MULTIVITAMIN WITH MINERALS) TABS Take 1 tablet by mouth daily.      . nitroGLYCERIN (NITROSTAT) 0.4 MG SL tablet Place 0.4 mg under the tongue every 5 (five) minutes as needed.  For chest pain      . pantoprazole (PROTONIX) 40 MG tablet Take 40 mg by mouth every morning.      . pravastatin (PRAVACHOL) 20 MG tablet Take 20 mg by mouth every evening.      . vitamin C (ASCORBIC ACID) 500 MG tablet Take 500 mg by mouth daily. Only in fall and winter        BP 128/80  Temp 100 F (37.8 C) (Oral)  Wt 202 lb (91.627 kg)       Review of Systems  Constitutional: Negative for fever, chills, appetite change and fatigue.  HENT: Negative for hearing loss, ear pain, congestion, sore throat, trouble swallowing, neck stiffness, dental problem, voice change and tinnitus.   Eyes: Negative for pain, discharge and visual disturbance.  Respiratory: Negative for cough, chest tightness, wheezing and stridor.   Cardiovascular: Negative for chest pain, palpitations and leg swelling.  Gastrointestinal: Negative for nausea, vomiting, abdominal pain, diarrhea, constipation, blood in stool and abdominal distention.  Genitourinary:  Positive for dysuria, urgency, frequency and difficulty urinating. Negative for hematuria, flank pain, discharge and genital sores.  Musculoskeletal: Negative for myalgias, back pain, joint swelling, arthralgias and gait problem.  Skin: Negative for rash.  Neurological: Negative for dizziness, syncope, speech difficulty, weakness, numbness and headaches.  Hematological: Negative for adenopathy. Does not bruise/bleed easily.  Psychiatric/Behavioral: Negative for behavioral problems and dysphoric mood. The patient is not nervous/anxious.        Objective:   Physical Exam  Constitutional: He is oriented to person, place, and time. He appears well-developed.  HENT:  Head: Normocephalic.  Right Ear: External ear normal.  Left Ear: External ear normal.  Eyes: Conjunctivae normal and EOM are normal.  Neck: Normal range of motion.  Cardiovascular: Normal rate and normal heart sounds.   Pulmonary/Chest: Breath sounds normal.  Abdominal: Soft. Bowel sounds are normal. There is tenderness.       Mild suprapubic discomfort  Musculoskeletal: Normal range of motion. He exhibits no edema and no tenderness.  Neurological: He is alert and oriented to person, place, and time.  Psychiatric: He has a normal mood and affect. His behavior is normal.          Assessment & Plan:   Acute UTI. The patient lives out of town and will not be able to pick up his prescription until this evening treat with Rocephin 500 IM and started on Cipro later today. We'll check a urine C&S Hypertension stable

## 2012-01-07 NOTE — Progress Notes (Signed)
i ordered repeat cbc to check white count day of surgery

## 2012-01-07 NOTE — Progress Notes (Signed)
Cbc results routed by epic to dr Derrell Lolling in basket

## 2012-01-08 ENCOUNTER — Telehealth (INDEPENDENT_AMBULATORY_CARE_PROVIDER_SITE_OTHER): Payer: Self-pay | Admitting: General Surgery

## 2012-01-08 NOTE — Telephone Encounter (Signed)
Wife called to verify holding ASA and Plavix for pt pre-op.  She was unable to attend his pre-surgical screen, where this was addressed.  Explained he needs to hold these for 5 days pre-op and they will be restarted after surgery is completed.  She understands and will comply.

## 2012-01-09 ENCOUNTER — Other Ambulatory Visit: Payer: Self-pay | Admitting: Internal Medicine

## 2012-01-09 LAB — URINE CULTURE: Colony Count: >=100000

## 2012-01-14 ENCOUNTER — Encounter (HOSPITAL_COMMUNITY): Payer: Self-pay | Admitting: Anesthesiology

## 2012-01-14 ENCOUNTER — Encounter (HOSPITAL_COMMUNITY): Payer: Self-pay | Admitting: *Deleted

## 2012-01-14 ENCOUNTER — Encounter (HOSPITAL_COMMUNITY)
Admission: RE | Disposition: A | Discharge status: Home / Self Care | Payer: Self-pay | Source: Ambulatory Visit | Attending: General Surgery

## 2012-01-14 ENCOUNTER — Ambulatory Visit (HOSPITAL_COMMUNITY)
Admission: RE | Admit: 2012-01-14 | Discharge: 2012-01-14 | Disposition: A | Discharge status: Home / Self Care | Payer: PRIVATE HEALTH INSURANCE | Source: Ambulatory Visit | Attending: General Surgery | Admitting: General Surgery

## 2012-01-14 ENCOUNTER — Ambulatory Visit (HOSPITAL_COMMUNITY): Payer: PRIVATE HEALTH INSURANCE | Admitting: Anesthesiology

## 2012-01-14 DIAGNOSIS — K4090 Unilateral inguinal hernia, without obstruction or gangrene, not specified as recurrent: Secondary | ICD-10-CM

## 2012-01-14 DIAGNOSIS — I252 Old myocardial infarction: Secondary | ICD-10-CM | POA: Insufficient documentation

## 2012-01-14 HISTORY — PX: INGUINAL HERNIA REPAIR: SHX194

## 2012-01-14 HISTORY — PX: INSERTION OF MESH: SHX5868

## 2012-01-14 LAB — CBC
HCT: 39.6 % (ref 39.0–52.0)
Platelets: 209 10*3/uL (ref 150–400)
RDW: 12.8 % (ref 11.5–15.5)
WBC: 6.7 10*3/uL (ref 4.0–10.5)

## 2012-01-14 SURGERY — REPAIR, HERNIA, INGUINAL, LAPAROSCOPIC
Anesthesia: General | Site: Groin | Laterality: Right | Wound class: Clean

## 2012-01-14 MED ORDER — OXYCODONE HCL 5 MG PO TABS: 5.0000 mg | ORAL_TABLET | ORAL | Status: DC | PRN

## 2012-01-14 MED ORDER — ACETAMINOPHEN 10 MG/ML IV SOLN
INTRAVENOUS | Status: AC
  Filled 2012-01-14: qty 100

## 2012-01-14 MED ORDER — GLYCOPYRROLATE 0.2 MG/ML IJ SOLN
INTRAMUSCULAR | Status: DC | PRN
  Administered 2012-01-14: .8 mg via INTRAVENOUS

## 2012-01-14 MED ORDER — MIDAZOLAM HCL 5 MG/5ML IJ SOLN
INTRAMUSCULAR | Status: DC | PRN
  Administered 2012-01-14: 2 mg via INTRAVENOUS

## 2012-01-14 MED ORDER — ONDANSETRON HCL 4 MG/2ML IJ SOLN
INTRAMUSCULAR | Status: DC | PRN
  Administered 2012-01-14: 4 mg via INTRAVENOUS

## 2012-01-14 MED ORDER — CEFAZOLIN SODIUM-DEXTROSE 2-3 GM-% IV SOLR
2.0000 g | INTRAVENOUS | Status: AC
  Administered 2012-01-14: 2 g via INTRAVENOUS

## 2012-01-14 MED ORDER — MEPERIDINE HCL 50 MG/ML IJ SOLN: 6.2500 mg | INTRAMUSCULAR | Status: DC | PRN

## 2012-01-14 MED ORDER — ACETAMINOPHEN 10 MG/ML IV SOLN: 1000.0000 mg | Freq: Once | INTRAVENOUS | Status: DC | PRN

## 2012-01-14 MED ORDER — BUPIVACAINE HCL 0.25 % IJ SOLN
INTRAMUSCULAR | Status: AC
  Filled 2012-01-14: qty 1

## 2012-01-14 MED ORDER — DEXAMETHASONE SODIUM PHOSPHATE 10 MG/ML IJ SOLN
INTRAMUSCULAR | Status: DC | PRN
  Administered 2012-01-14: 10 mg via INTRAVENOUS

## 2012-01-14 MED ORDER — PROMETHAZINE HCL 25 MG/ML IJ SOLN: 6.2500 mg | INTRAMUSCULAR | Status: DC | PRN

## 2012-01-14 MED ORDER — HYDROMORPHONE HCL PF 1 MG/ML IJ SOLN: 0.2500 mg | INTRAMUSCULAR | Status: DC | PRN

## 2012-01-14 MED ORDER — KETOROLAC TROMETHAMINE 30 MG/ML IJ SOLN
INTRAMUSCULAR | Status: DC | PRN
  Administered 2012-01-14: 30 mg via INTRAVENOUS

## 2012-01-14 MED ORDER — ACETAMINOPHEN 10 MG/ML IV SOLN
INTRAVENOUS | Status: DC | PRN
  Administered 2012-01-14: 1000 mg via INTRAVENOUS

## 2012-01-14 MED ORDER — NEOSTIGMINE METHYLSULFATE 1 MG/ML IJ SOLN
INTRAMUSCULAR | Status: DC | PRN
  Administered 2012-01-14: 5 mg via INTRAVENOUS

## 2012-01-14 MED ORDER — OXYCODONE-ACETAMINOPHEN 5-325 MG PO TABS: 1.0000 | ORAL_TABLET | ORAL | Status: DC | PRN

## 2012-01-14 MED ORDER — LIDOCAINE HCL (CARDIAC) 20 MG/ML IV SOLN
INTRAVENOUS | Status: DC | PRN
  Administered 2012-01-14: 60 mg via INTRAVENOUS

## 2012-01-14 MED ORDER — SODIUM CHLORIDE 0.9 % IR SOLN
Status: DC | PRN
  Administered 2012-01-14: 150 mL via INTRAVESICAL

## 2012-01-14 MED ORDER — CEFAZOLIN SODIUM-DEXTROSE 2-3 GM-% IV SOLR
INTRAVENOUS | Status: AC
  Filled 2012-01-14: qty 50

## 2012-01-14 MED ORDER — OXYCODONE HCL 5 MG/5ML PO SOLN
5.0000 mg | Freq: Once | ORAL | Status: DC | PRN
  Filled 2012-01-14: qty 5

## 2012-01-14 MED ORDER — LACTATED RINGERS IV SOLN
INTRAVENOUS | Status: DC | PRN
  Administered 2012-01-14 (×2): via INTRAVENOUS

## 2012-01-14 MED ORDER — PROPOFOL 10 MG/ML IV BOLUS
INTRAVENOUS | Status: DC | PRN
  Administered 2012-01-14: 200 mg via INTRAVENOUS

## 2012-01-14 MED ORDER — FENTANYL CITRATE 0.05 MG/ML IJ SOLN
INTRAMUSCULAR | Status: DC | PRN
  Administered 2012-01-14: 100 ug via INTRAVENOUS
  Administered 2012-01-14 (×7): 50 ug via INTRAVENOUS

## 2012-01-14 MED ORDER — BUPIVACAINE-EPINEPHRINE 0.25% -1:200000 IJ SOLN
INTRAMUSCULAR | Status: DC | PRN
  Administered 2012-01-14: 10 mL

## 2012-01-14 MED ORDER — ROCURONIUM BROMIDE 100 MG/10ML IV SOLN
INTRAVENOUS | Status: DC | PRN
  Administered 2012-01-14: 10 mg via INTRAVENOUS
  Administered 2012-01-14: 40 mg via INTRAVENOUS

## 2012-01-14 MED ORDER — OXYCODONE HCL 5 MG PO TABS: 5.0000 mg | ORAL_TABLET | Freq: Once | ORAL | Status: DC | PRN

## 2012-01-14 SURGICAL SUPPLY — 38 items
APL SKNCLS STERI-STRIP NONHPOA (GAUZE/BANDAGES/DRESSINGS) ×1
APPLIER CLIP LOGIC TI 5 (MISCELLANEOUS) IMPLANT
APR CLP MED LRG 33X5 (MISCELLANEOUS)
BENZOIN TINCTURE PRP APPL 2/3 (GAUZE/BANDAGES/DRESSINGS) ×2 IMPLANT
CANISTER SUCTION 2500CC (MISCELLANEOUS) ×1 IMPLANT
CHLORAPREP W/TINT 26ML (MISCELLANEOUS) ×2 IMPLANT
CLOTH BEACON ORANGE TIMEOUT ST (SAFETY) ×2 IMPLANT
COVER SURGICAL LIGHT HANDLE (MISCELLANEOUS) ×2 IMPLANT
DEVICE SECURE STRAP 25 ABSORB (INSTRUMENTS) ×1 IMPLANT
DEVICE TROCAR PUNCTURE CLOSURE (ENDOMECHANICALS) ×1 IMPLANT
DISSECTOR BLUNT TIP ENDO 5MM (MISCELLANEOUS) ×1 IMPLANT
ELECT REM PT RETURN 9FT ADLT (ELECTROSURGICAL) ×2
ELECTRODE REM PT RTRN 9FT ADLT (ELECTROSURGICAL) ×1 IMPLANT
GAUZE SPONGE 2X2 8PLY STRL LF (GAUZE/BANDAGES/DRESSINGS) ×1 IMPLANT
GLOVE BIO SURGEON STRL SZ7.5 (GLOVE) ×4 IMPLANT
GLOVE BIOGEL PI IND STRL 8 (GLOVE) ×1 IMPLANT
GLOVE BIOGEL PI INDICATOR 8 (GLOVE) ×1
GOWN STRL NON-REIN LRG LVL3 (GOWN DISPOSABLE) ×6 IMPLANT
KIT BASIN OR (CUSTOM PROCEDURE TRAY) ×2 IMPLANT
KIT ROOM TURNOVER OR (KITS) ×2 IMPLANT
MESH C-QUR LITE 10.5X14.6CM (Mesh General) ×1 IMPLANT
NDL INSUFFLATION 14GA 120MM (NEEDLE) ×1 IMPLANT
NEEDLE INSUFFLATION 14GA 120MM (NEEDLE) ×2 IMPLANT
NS IRRIG 1000ML POUR BTL (IV SOLUTION) ×2 IMPLANT
PAD ARMBOARD 7.5X6 YLW CONV (MISCELLANEOUS) ×4 IMPLANT
SCISSORS LAP 5X35 DISP (ENDOMECHANICALS) ×2 IMPLANT
SET IRRIG TUBING LAPAROSCOPIC (IRRIGATION / IRRIGATOR) ×1 IMPLANT
SLEEVE ENDOPATH XCEL 5M (ENDOMECHANICALS) ×2 IMPLANT
SPONGE GAUZE 2X2 STER 10/PKG (GAUZE/BANDAGES/DRESSINGS) ×1
SUT MNCRL AB 4-0 PS2 18 (SUTURE) ×2 IMPLANT
SUT VIC AB 1 CT1 27 (SUTURE) ×2
SUT VIC AB 1 CT1 27XBRD ANBCTR (SUTURE) ×1 IMPLANT
TOWEL OR 17X24 6PK STRL BLUE (TOWEL DISPOSABLE) ×2 IMPLANT
TOWEL OR 17X26 10 PK STRL BLUE (TOWEL DISPOSABLE) ×2 IMPLANT
TRAY FOLEY CATH 14FR (SET/KITS/TRAYS/PACK) ×2 IMPLANT
TRAY LAPAROSCOPIC (CUSTOM PROCEDURE TRAY) ×2 IMPLANT
TROCAR XCEL NON-BLD 11X100MML (ENDOMECHANICALS) ×3 IMPLANT
TROCAR XCEL NON-BLD 5MMX100MML (ENDOMECHANICALS) ×3 IMPLANT

## 2012-01-14 NOTE — Preoperative (Signed)
Beta Blockers   Reason not to administer Beta Blockers:Not Applicable 

## 2012-01-14 NOTE — Anesthesia Postprocedure Evaluation (Signed)
Anesthesia Post Note  Patient: Jose [name]  Procedure(s) Performed: Procedure(s) (LRB): LAPAROSCOPIC INGUINAL HERNIA (Right) INSERTION OF MESH (Right)  Anesthesia type: General  Patient location: PACU  Post pain: Pain level controlled  Post assessment: Post-op Vital signs reviewed  Last Vitals: BP 153/81  Pulse 55  Temp 36.5 C (Oral)  Resp 14  SpO2 96%  Post vital signs: Reviewed  Level of consciousness: sedated  Complications: No apparent anesthesia complications

## 2012-01-14 NOTE — Anesthesia Preprocedure Evaluation (Addendum)
Anesthesia Evaluation  Patient identified by MRN, date of birth, ID band Patient awake    Reviewed: Allergy & Precautions, H&P , NPO status , Patient's Chart, lab work & pertinent test results  Airway Mallampati: II TM Distance: >3 FB Neck ROM: Full    Dental  (+) Teeth Intact and Dental Advisory Given   Pulmonary neg pulmonary ROS,  breath sounds clear to auscultation  Pulmonary exam normal       Cardiovascular hypertension, Pt. on medications + CAD and + Cardiac Stents + dysrhythmias Rhythm:Regular Rate:Normal     Neuro/Psych Seizures -,  Anxiety TIAnegative neurological ROS     GI/Hepatic Neg liver ROS, GERD-  Medicated,  Endo/Other  negative endocrine ROS  Renal/GU negative Renal ROS     Musculoskeletal negative musculoskeletal ROS (+)   Abdominal   Peds  Hematology negative hematology ROS (+)   Anesthesia Other Findings   Reproductive/Obstetrics                          Anesthesia Physical Anesthesia Plan  ASA: III  Anesthesia Plan: General   Post-op Pain Management:    Induction: Intravenous  Airway Management Planned: Oral ETT  Additional Equipment:   Intra-op Plan:   Post-operative Plan: Extubation in OR  Informed Consent: I have reviewed the patients History and Physical, chart, labs and discussed the procedure including the risks, benefits and alternatives for the proposed anesthesia with the patient or authorized representative who has indicated his/her understanding and acceptance.   Dental advisory given  Plan Discussed with: CRNA  Anesthesia Plan Comments:         Anesthesia Quick Evaluation

## 2012-01-14 NOTE — Transfer of Care (Signed)
Immediate Anesthesia Transfer of Care Note  Patient: Jose [name]  Procedure(s) Performed: Procedure(s) (LRB) with comments: LAPAROSCOPIC INGUINAL HERNIA (Right) INSERTION OF MESH (Right)  Patient Location: PACU  Anesthesia Type:General  Level of Consciousness: awake, alert  and patient cooperative  Airway & Oxygen Therapy: Patient Spontanous Breathing and Patient connected to face mask oxygen  Post-op Assessment: Report given to PACU RN  Post vital signs: Reviewed  Complications: No apparent anesthesia complications

## 2012-01-14 NOTE — H&P (Signed)
  HPI  This patient is a 61 year old male with a several week history of any right inguinal hernia. Patient states he noticed the bulge after Labor Day holiday has noticed pain in the area since then. Patient states she notices a bulge after longus tendon. Patient states that he has minimal bulge in his right groin in the morning after awakening. The patient recalls no inciting factors and no bleeding sites at this time.   Review of Systems  Constitutional: Negative.  Eyes: Negative.  Cardiovascular: Negative.  Gastrointestinal: Negative.  Musculoskeletal: Negative.    Objective:   Physical Exam  Constitutional: He is oriented to person, place, and time. He appears well-nourished.  HENT:  Head: Normocephalic and atraumatic.  Eyes: Conjunctivae normal are normal. Pupils are equal, round, and reactive to light.  Neck: Normal range of motion. Neck supple.  Cardiovascular: Normal rate and regular rhythm.  Pulmonary/Chest: Effort normal.  Abdominal: Soft. A hernia is present. Hernia confirmed positive in the right inguinal area.  Musculoskeletal: Normal range of motion.  Neurological: He is alert and oriented to person, place, and time.    Assessment:   The patient is a 61 year old male with a right inguinal hernia repair. 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 We discussed the options of laparoscopic and open hernia patient states he would like to proceed with a laparoscopic repair. She will require cardiac evaluation to be off Plavix prior to scheduling for the hernia repair.    Plan:   1. We are going to proceed with lap RIHR with mesh 2. All risks and benefits were discussed with the patient, to generally include infection, bleeding, damage to surrounding structures, and recurrence. Alternatives were offered and described. All questions were answered and the patient voiced understanding of the procedure and wishes to proceed at this  point.

## 2012-01-14 NOTE — Op Note (Signed)
Pre Operative Diagnosis:  Right inguinal hernia  Post Operative Diagnosis: same  Procedure: laparoscopic right inguinal hernia repair with mesh  Surgeon: Dr. Axel Filler  Assistant: none  Anesthesia: GETA  EBL: 10 cc  Complications: none  Counts: reported as correct x 2  Findings:  The patient had a small right indirect hernia  Indications for procedure:  The patient is a 61 year old male with a right-sided hernia for several months. Patient didn't complain of symptomatology to his right groin. The patient was taken back for elective right repair.  Details of the procedure:The patient was taken back to the operating room. The patient was placed in supine position with bilateral SCDs in place. After appropriate anitbiotics were confirmed, a time-out was confirmed and all facts were verified.  0.25% Marcaine was used to infiltrate the umbilical area. He was used to cut down the skin and blunt dissection was used to get the anterior fashion.  The anterior fascia was incised approximately 1 cm and the muscles were divided anteriorly. Blunt dissection was then used to create a space in the preperitoneal area. At this time a 10 mm camera was then introduced into the space and advanced the pubic tubercle and a 12 mm trocar was placed over this and insufflation was started.  At this time and space was created from medial to laterally the preperitoneal space. The hernia sac was identified. Dissection of the hernia sac was undertaken the vas deferens was identified and protected up most of the case. There is small. There was a small tear into the hernia sac. A Veress needle right upper quadrant to help evacuate the intraperitoneal air.  Once the hernia sac was taken down to approximately the umbilicus to TET 20x20 Parietex mesh was cut into shape and introduced into the preperitoneal space.  The mesh was brought over the hernia defect and anchored into place and secured to Cooper's ligament with 4.0  there was a hernia stapler staples. It was anchored to the anterior abdominal wall with 4.8 mm staples. The hernia sac was seen over the mesh. There was no staples placed laterally. The insufflation was evacuated. The trochars were removed. The anterior fascia was reapproximated using #1 Vicryl on a UR- 6.  Intra-abdominal air was evacuated the Veress needle. The skin was reapproximated using 4-0 Monocryl subcuticular fashion the patient was awakened from general anesthesia and taken to recovery in stable condition.

## 2012-01-15 ENCOUNTER — Telehealth (INDEPENDENT_AMBULATORY_CARE_PROVIDER_SITE_OTHER): Payer: Self-pay

## 2012-01-15 ENCOUNTER — Encounter (HOSPITAL_COMMUNITY): Payer: Self-pay | Admitting: General Surgery

## 2012-01-15 NOTE — Telephone Encounter (Signed)
I called patient to make his follow up appointtment for 11/26 @ 4:50. He told me once last night he had a little blood in his urine stream when he first started to urinate. He states he has not had any blood in urine at all today. I told him I would send this message to Dr. Derrell Lolling to let him know. I told patient he needs to keep an eye on it and call us if he sees anymore blood in urine. He will keep [place] updated.

## 2012-01-21 ENCOUNTER — Other Ambulatory Visit (INDEPENDENT_AMBULATORY_CARE_PROVIDER_SITE_OTHER): Payer: PRIVATE HEALTH INSURANCE

## 2012-01-21 DIAGNOSIS — Z0000 Encounter for general adult medical examination without abnormal findings: Secondary | ICD-10-CM

## 2012-01-21 LAB — HEPATIC FUNCTION PANEL
ALT: 31 U/L (ref 0–53)
AST: 29 U/L (ref 0–37)
Albumin: 3.5 g/dL (ref 3.5–5.2)
Total Protein: 6.3 g/dL (ref 6.0–8.3)

## 2012-01-21 LAB — CBC WITH DIFFERENTIAL/PLATELET
Basophils Relative: 0.3 % (ref 0.0–3.0)
Eosinophils Relative: 1.6 % (ref 0.0–5.0)
HCT: 43.2 % (ref 39.0–52.0)
Hemoglobin: 14.2 g/dL (ref 13.0–17.0)
Lymphs Abs: 1.6 10*3/uL (ref 0.7–4.0)
MCV: 94.7 fl (ref 78.0–100.0)
Monocytes Absolute: 0.8 10*3/uL (ref 0.1–1.0)
Monocytes Relative: 9.6 % (ref 3.0–12.0)
Neutro Abs: 5.4 10*3/uL (ref 1.4–7.7)
Platelets: 213 10*3/uL (ref 150.0–400.0)
RBC: 4.57 Mil/uL (ref 4.22–5.81)
WBC: 7.9 10*3/uL (ref 4.5–10.5)

## 2012-01-21 LAB — POCT URINALYSIS DIPSTICK
Blood, UA: NEGATIVE
Leukocytes, UA: NEGATIVE
Nitrite, UA: NEGATIVE
Protein, UA: NEGATIVE
pH, UA: 5.5

## 2012-01-21 LAB — BASIC METABOLIC PANEL
BUN: 16 mg/dL (ref 6–23)
Chloride: 102 mEq/L (ref 96–112)
Potassium: 4.7 mEq/L (ref 3.5–5.1)
Sodium: 136 mEq/L (ref 135–145)

## 2012-01-21 LAB — LIPID PANEL
HDL: 45.1 mg/dL (ref >39.00)
LDL Cholesterol: 96 mg/dL (ref 0–99)
Total CHOL/HDL Ratio: 4

## 2012-01-22 LAB — TSH: TSH: 3.18 u[IU]/mL (ref 0.35–5.50)

## 2012-01-27 ENCOUNTER — Other Ambulatory Visit: Payer: Self-pay | Admitting: Internal Medicine

## 2012-01-28 ENCOUNTER — Encounter: Payer: PRIVATE HEALTH INSURANCE | Admitting: Internal Medicine

## 2012-01-29 ENCOUNTER — Ambulatory Visit (INDEPENDENT_AMBULATORY_CARE_PROVIDER_SITE_OTHER): Payer: PRIVATE HEALTH INSURANCE | Admitting: General Surgery

## 2012-01-29 ENCOUNTER — Encounter (INDEPENDENT_AMBULATORY_CARE_PROVIDER_SITE_OTHER): Payer: Self-pay | Admitting: General Surgery

## 2012-01-29 VITALS — BP 127/78 | HR 74 | Temp 97.0°F | Resp 12 | Ht 69.0 in | Wt 203.4 lb

## 2012-01-29 DIAGNOSIS — Z09 Encounter for follow-up examination after completed treatment for conditions other than malignant neoplasm: Secondary | ICD-10-CM

## 2012-01-29 NOTE — Progress Notes (Signed)
Patient ID: Jose [name], male   DOB: 1950-04-20, 61 y.o.   MRN: [id number] The patient is a 61 year old male status post laparoscopic right or peritonitis. Patient approximately 2 weeks postoperative. This has been doing well and is returned to work. Patient complains of no pain at this time. Patient otherwise eating well and having good bowel function.   On exam: Wound Clean dry and intact There is no palpable hernia  Assessment and plan: Patient followup in one month. Patient to continue with increased his activity however no heavy lifting greater than 1520 pounds.

## 2012-02-05 ENCOUNTER — Other Ambulatory Visit: Payer: Self-pay | Admitting: Internal Medicine

## 2012-02-06 ENCOUNTER — Encounter: Payer: Self-pay | Admitting: Internal Medicine

## 2012-02-19 ENCOUNTER — Encounter (INDEPENDENT_AMBULATORY_CARE_PROVIDER_SITE_OTHER): Payer: Self-pay | Admitting: General Surgery

## 2012-02-19 ENCOUNTER — Ambulatory Visit (INDEPENDENT_AMBULATORY_CARE_PROVIDER_SITE_OTHER): Payer: PRIVATE HEALTH INSURANCE | Admitting: General Surgery

## 2012-02-19 VITALS — BP 139/82 | HR 72 | Temp 98.0°F | Resp 16 | Ht 69.0 in | Wt 202.4 lb

## 2012-02-19 DIAGNOSIS — Z9889 Other specified postprocedural states: Secondary | ICD-10-CM

## 2012-02-19 NOTE — Progress Notes (Signed)
Patient ID: Jose [name], male   DOB: October 29, 1950, 61 y.o.   MRN: [id number] Patient is 61 year old male is status post laparoscopic inguinal hernia repair. patient has been doing well postoperatively. He is currently 2 months at this time is continuing with his normal activities.  On exam: Wounds are clean dry and intact, there is no hernia  Assessment and plan: Patient follow up when necessary. Patient can resume normal activity with no weight restrictions.

## 2012-03-11 ENCOUNTER — Encounter: Payer: Self-pay | Admitting: Internal Medicine

## 2012-03-11 ENCOUNTER — Ambulatory Visit (INDEPENDENT_AMBULATORY_CARE_PROVIDER_SITE_OTHER): Payer: PRIVATE HEALTH INSURANCE | Admitting: Internal Medicine

## 2012-03-11 VITALS — BP 132/70 | HR 70 | Temp 98.4°F | Resp 18 | Ht 67.5 in | Wt 204.0 lb

## 2012-03-11 DIAGNOSIS — Z0000 Encounter for general adult medical examination without abnormal findings: Secondary | ICD-10-CM

## 2012-03-11 DIAGNOSIS — Z860100 Personal history of colon polyps, unspecified: Secondary | ICD-10-CM

## 2012-03-11 DIAGNOSIS — I2510 Atherosclerotic heart disease of native coronary artery without angina pectoris: Secondary | ICD-10-CM

## 2012-03-11 DIAGNOSIS — I10 Essential (primary) hypertension: Secondary | ICD-10-CM

## 2012-03-11 DIAGNOSIS — E785 Hyperlipidemia, unspecified: Secondary | ICD-10-CM

## 2012-03-11 DIAGNOSIS — R972 Elevated prostate specific antigen [PSA]: Secondary | ICD-10-CM

## 2012-03-11 NOTE — Patient Instructions (Signed)

## 2012-03-11 NOTE — Progress Notes (Signed)
Subjective:    Patient ID: Jose [name], male    DOB: 1950/06/27, 62 y.o.   MRN: [id number]  HPI  62 year old patient who is seen today for a health maintenance evaluation. He has a history of coronary artery disease and is followed by cardiology. He has history colonic polyps and is due for a followup colonoscopy. He is status post uneventful inguinal hernia repair in November. At that time laboratory studies were obtained that revealed an elevated PSA in the setting of an acute UTI. Today he is doing quite well. He has a history of alcohol use but has been abstinent. He also has discontinued tobacco use (smokeless)  Past Medical History  Diagnosis Date  . History of TIA (transient ischemic attack) may 2010  . HYPERTENSION 09/18/2006  . GERD 09/18/2006  . 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  . ANXIETY 07/27/2008  . ALCOHOL ABUSE 07/27/2008  . LOW BACK PAIN 09/18/2006  . Rosacea 07/27/2008  . Hx of adenomatous colonic polyps   . Bifascicular block     beta blocker stopped due to profound bradycardia  . CAD 07/27/2008    a. s/p Endeavor DES to CFX 07/2008; residual OM1 70%, EF 45%;  b. relook cath 5/10: patent stent in CFX  . Urinary tract infection start cipro 01-07-2012  . SEIZURE DISORDER 09/18/2006    none since 2008  . Numbness and tingling     finger tips at times    History   Social History  . Marital Status: Married    Spouse Name: N/A    Number of Children: N/A  . Years of Education: N/A   Occupational History  . Not on file.   Social History Main Topics  . Smoking status: Never Smoker   . Smokeless tobacco: Current User    Types: Chew  . Alcohol Use: No     Comment: 2-3 daily and sometimes binge drinks on the weekend  . Drug Use: No  . Sexually Active: Not on file   Other Topics Concern  . Not on file   Social History Narrative  . No narrative on file    Past Surgical History  Procedure Date  . Knee surgery   . Coronary stent placement     drug eluting  . Inguinal  hernia repair 01/14/2012    Procedure: LAPAROSCOPIC INGUINAL HERNIA;  Surgeon: Axel Filler, MD;  Location: [facility];  Service: General;  Laterality: Right;  . Insertion of mesh 01/14/2012    Procedure: INSERTION OF MESH;  Surgeon: Axel Filler, MD;  Location: [facility];  Service: General;  Laterality: Right;  . Hernia repair     Family History  Problem Relation Age of Onset  . Lung cancer    . Emphysema    . Hypertension    . Heart attack      No Known Allergies  Current Outpatient Prescriptions on File Prior to Visit  Medication Sig Dispense Refill  . ALPRAZolam (XANAX) 0.5 MG tablet Take 0.5 mg by mouth 3 (three) times daily as needed. For anxiety      . aspirin 81 MG tablet Take 81 mg by mouth every morning.       . Azelaic Acid (FINACEA) 15 % cream Apply 1 application topically as directed. After skin is thoroughly washed and patted dry, gently but thoroughly massage a thin film of azelaic acid cream into the affected area twice daily, in the morning and evening. Applied to face.      . benazepril (LOTENSIN) 40 MG  tablet Take 40 mg by mouth every morning.      . ciprofloxacin (CIPRO) 500 MG tablet Take 1 tablet (500 mg total) by mouth 2 (two) times daily.  14 tablet  0  . clopidogrel (PLAVIX) 75 MG tablet TAKE 1 TABLET (75 MG TOTAL) BY MOUTH DAILY.  30 tablet  5  . diclofenac sodium (VOLTAREN) 1 % GEL Apply 2 g topically 2 (two) times daily as needed. Apply to painful areas around knees      . halobetasol (ULTRAVATE) 0.05 % cream Apply 1 application topically as directed.       Marland Kitchen ibuprofen (ADVIL,MOTRIN) 200 MG tablet Take 200 mg by mouth every 6 (six) hours as needed.      . Multiple Vitamin (MULTIVITAMIN WITH MINERALS) TABS Take 1 tablet by mouth daily.      . nitroGLYCERIN (NITROSTAT) 0.4 MG SL tablet Place 0.4 mg under the tongue every 5 (five) minutes as needed. For chest pain      . pantoprazole (PROTONIX) 40 MG tablet TAKE 1 TABLET BY MOUTH EVERY DAY  90 tablet  0  .  pravastatin (PRAVACHOL) 20 MG tablet Take 20 mg by mouth every evening.      . vitamin C (ASCORBIC ACID) 500 MG tablet Take 500 mg by mouth daily. Only in fall and winter        BP 132/70  Pulse 70  Temp 98.4 F (36.9 C) (Oral)  Resp 18  Ht 5' 7.5" (1.715 m)  Wt 204 lb (92.534 kg)  BMI 31.48 kg/m2  SpO2 97%       Review of Systems  Constitutional: Negative for fever, chills, activity change, appetite change and fatigue.  HENT: Negative for hearing loss, ear pain, congestion, rhinorrhea, sneezing, mouth sores, trouble swallowing, neck pain, neck stiffness, dental problem, voice change, sinus pressure and tinnitus.   Eyes: Negative for photophobia, pain, redness and visual disturbance.  Respiratory: Negative for apnea, cough, choking, chest tightness, shortness of breath and wheezing.   Cardiovascular: Negative for chest pain, palpitations and leg swelling.  Gastrointestinal: Negative for nausea, vomiting, abdominal pain, diarrhea, constipation, blood in stool, abdominal distention, anal bleeding and rectal pain.  Genitourinary: Negative for dysuria, urgency, frequency, hematuria, flank pain, decreased urine volume, discharge, penile swelling, scrotal swelling, difficulty urinating, genital sores and testicular pain.  Musculoskeletal: Negative for myalgias, back pain, joint swelling, arthralgias and gait problem.  Skin: Negative for color change, rash and wound.  Neurological: Negative for dizziness, tremors, seizures, syncope, facial asymmetry, speech difficulty, weakness, light-headedness, numbness and headaches.  Hematological: Negative for adenopathy. Does not bruise/bleed easily.  Psychiatric/Behavioral: Negative for suicidal ideas, hallucinations, behavioral problems, confusion, sleep disturbance, self-injury, dysphoric mood, decreased concentration and agitation. The patient is not nervous/anxious.        Objective:   Physical Exam  Constitutional: He appears well-developed  and well-nourished.       Weight 204 Blood pressure 130/70  HENT:  Head: Normocephalic and atraumatic.  Right Ear: External ear normal.  Left Ear: External ear normal.  Nose: Nose normal.  Mouth/Throat: Oropharynx is clear and moist.  Eyes: Conjunctivae normal and EOM are normal. Pupils are equal, round, and reactive to light. No scleral icterus.  Neck: Normal range of motion. Neck supple. No JVD present. No thyromegaly present.  Cardiovascular: Regular rhythm, normal heart sounds and intact distal pulses.  Exam reveals no gallop and no friction rub.   No murmur heard. Pulmonary/Chest: Effort normal and breath sounds normal. He exhibits no tenderness.  Abdominal: Soft. Bowel sounds are normal. He exhibits no distension and no mass. There is no tenderness.  Genitourinary: Penis normal.       Prostate slightly enlarged with a prominent left lobe  Musculoskeletal: Normal range of motion. He exhibits no edema and no tenderness.  Lymphadenopathy:    He has no cervical adenopathy.  Neurological: He is alert. He has normal reflexes. No cranial nerve deficit. Coordination normal.  Skin: Skin is warm and dry. No rash noted.  Psychiatric: He has a normal mood and affect. His behavior is normal.          Assessment & Plan:   Preventive health examination Hypertension stable Coronary artery disease History colonic polyps we'll schedule followup colonoscopy Elevated PSA. We'll recheck in 3 months

## 2012-03-22 ENCOUNTER — Other Ambulatory Visit: Payer: Self-pay | Admitting: Internal Medicine

## 2012-04-08 ENCOUNTER — Ambulatory Visit (INDEPENDENT_AMBULATORY_CARE_PROVIDER_SITE_OTHER): Payer: PRIVATE HEALTH INSURANCE | Admitting: Gastroenterology

## 2012-04-08 ENCOUNTER — Telehealth: Payer: Self-pay

## 2012-04-08 ENCOUNTER — Encounter: Payer: Self-pay | Admitting: Gastroenterology

## 2012-04-08 VITALS — BP 142/72 | HR 60 | Ht 66.5 in | Wt 205.0 lb

## 2012-04-08 DIAGNOSIS — Z86010 Personal history of colonic polyps: Secondary | ICD-10-CM

## 2012-04-08 DIAGNOSIS — Z7901 Long term (current) use of anticoagulants: Secondary | ICD-10-CM

## 2012-04-08 DIAGNOSIS — Z9229 Personal history of other drug therapy: Secondary | ICD-10-CM

## 2012-04-08 MED ORDER — PEG-KCL-NACL-NASULF-NA ASC-C 100 G PO SOLR: 1.0000 | Freq: Once | ORAL | Status: DC

## 2012-04-08 NOTE — Telephone Encounter (Signed)
04/08/2012    RE: Jose [name] DOB: 07-Sep-1950 MRN: [id number]   Dear Dr Gala Romney,    We have scheduled the above patient for an endoscopic procedure Dr Christella Hartigan. Our records show that he is on anticoagulation therapy.   Please advise as to how long the patient may come off his therapy of Plavix  prior to the procedure, which is scheduled for 05/05/12.  Please fax back/ or route the completed form to Taytem Ghattas  at [phone].   Sincerely,  Chales Abrahams [facility])  Please respond by 04/29/12 and route back to [facility]

## 2012-04-08 NOTE — Patient Instructions (Addendum)
We will contact your cardiologist (to [facility] and [facility]) about holding your plavix for 5 days prior to colonoscopy. You will be set up for a colonoscopy for history of pre-cancerous polyps (LEC, moderate sedation), he prefers Monday.

## 2012-04-08 NOTE — Progress Notes (Signed)
Review of gastrointestinal problems:  1. tubular adenomas, several; colonoscopy 02/2007 (with immediate post polypectomy bleeding treated with clippintg), recall 3 years; 02/2011 he was scheduled for repeat colonoscopy but he cancelled and never rescheduled.   HPI: This is a very pleasant 62 yo man whom I saw a little over a year ago. He is here with his wife today.  At that last visit we discussed repeat colonoscopy for personal history of colon polyps. He scheduled the appointment however he canceled and did not reschedule.  Generally healthy.  No overt bleeding.  Had right inguinal hernia repair 2-3 months, smooth recovery. Previous [facility] pt, will be with [facility]. He is on plavix.    Past Medical History  Diagnosis Date  . History of TIA (transient ischemic attack) may 2010  . HYPERTENSION 09/18/2006  . GERD 09/18/2006  . 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  . ANXIETY 07/27/2008  . ALCOHOL ABUSE 07/27/2008  . LOW BACK PAIN 09/18/2006  . Rosacea 07/27/2008  . Hx of adenomatous colonic polyps   . Bifascicular block     beta blocker stopped due to profound bradycardia  . CAD 07/27/2008    a. s/p Endeavor DES to CFX 07/2008; residual OM1 70%, EF 45%;  b. relook cath 5/10: patent stent in CFX  . Urinary tract infection start cipro 01-07-2012  . SEIZURE DISORDER 09/18/2006    none since 2008  . Numbness and tingling     finger tips at times    Past Surgical History  Procedure Date  . Knee surgery   . Coronary stent placement     drug eluting  . Inguinal hernia repair 01/14/2012    Procedure: LAPAROSCOPIC INGUINAL HERNIA;  Surgeon: Axel Filler, MD;  Location: [facility];  Service: General;  Laterality: Right;  . Insertion of mesh 01/14/2012    Procedure: INSERTION OF MESH;  Surgeon: Axel Filler, MD;  Location: [facility];  Service: General;  Laterality: Right;  . Hernia repair     Current Outpatient Prescriptions  Medication Sig Dispense Refill  . ALPRAZolam (XANAX) 0.5 MG tablet Take 0.5  mg by mouth 3 (three) times daily as needed. For anxiety      . aspirin 81 MG tablet Take 81 mg by mouth every morning.       . Azelaic Acid (FINACEA) 15 % cream Apply 1 application topically as directed. After skin is thoroughly washed and patted dry, gently but thoroughly massage a thin film of azelaic acid cream into the affected area twice daily, in the morning and evening. Applied to face.      . benazepril (LOTENSIN) 40 MG tablet TAKE 1 TABLET (40 MG TOTAL) BY MOUTH DAILY.  90 tablet  0  . clopidogrel (PLAVIX) 75 MG tablet TAKE 1 TABLET (75 MG TOTAL) BY MOUTH DAILY.  30 tablet  5  . diclofenac sodium (VOLTAREN) 1 % GEL Apply 2 g topically 2 (two) times daily as needed. Apply to painful areas around knees      . halobetasol (ULTRAVATE) 0.05 % cream Apply 1 application topically as directed.       Marland Kitchen ibuprofen (ADVIL,MOTRIN) 200 MG tablet Take 200 mg by mouth every 6 (six) hours as needed.      . Multiple Vitamin (MULTIVITAMIN WITH MINERALS) TABS Take 1 tablet by mouth daily.      . nitroGLYCERIN (NITROSTAT) 0.4 MG SL tablet Place 0.4 mg under the tongue every 5 (five) minutes as needed. For chest pain      . pantoprazole (PROTONIX) 40  MG tablet TAKE 1 TABLET BY MOUTH EVERY DAY  90 tablet  0  . pravastatin (PRAVACHOL) 20 MG tablet Take 20 mg by mouth every evening.      . vitamin C (ASCORBIC ACID) 500 MG tablet Take 500 mg by mouth daily. Only in fall and winter        Allergies as of 04/08/2012  . (No Known Allergies)    Family History  Problem Relation Age of Onset  . Lung cancer Mother   . Emphysema Father   . Hypertension Mother   . Heart attack Father     History   Social History  . Marital Status: Married    Spouse Name: N/A    Number of Children: 2  . Years of Education: N/A   Occupational History  . safety director/transportation    Social History Main Topics  . Smoking status: Never Smoker   . Smokeless tobacco: Former Neurosurgeon    Types: Chew    Quit date: 02/03/2012   . Alcohol Use: No     Comment: past drinker 2-3 daily and sometimes binge drinks on the weekend  . Drug Use: No  . Sexually Active: Not on file   Other Topics Concern  . Not on file   Social History Narrative  . No narrative on file      Physical Exam: BP 142/72  Pulse 60  Ht 5' 6.5" (1.689 m)  Wt 205 lb (92.987 kg)  BMI 32.59 kg/m2 Constitutional: generally well-appearing Psychiatric: alert and oriented x3 Abdomen: soft, nontender, nondistended, no obvious ascites, no peritoneal signs, normal bowel sounds     Assessment and plan: 62 y.o. male with personal history of adenomatous polyps, current Plavix use  He understands that he is at increased risk for procedure related bleeding since he is on Plavix. We will communicate with his cardiologist about holding his Plavix for 5 days prior to a repeat colonoscopy which he prefers to be done on a Monday. I see no reason for any further blood tests or imaging studies prior to that.

## 2012-04-09 NOTE — Telephone Encounter (Signed)
I will forward this message to Dr Excell Seltzer. Dr Excell Seltzer will be following this pt for Dr Gala Romney.

## 2012-04-10 ENCOUNTER — Telehealth: Payer: Self-pay | Admitting: Internal Medicine

## 2012-04-10 NOTE — Telephone Encounter (Signed)
Patient Information:  Caller Name: Key  Phone: [phone]  Patient: Jose [name], Jose [name]  Gender: Male  DOB: [birth year]  Age: 62 Years  PCP: Kriste Basque [facility])  Office Follow Up:  Does the office need to follow up with this patient?: No  Instructions For The Office: N/A  RN Note:  Plans to call back later for Appt since unable to make Appt now.  Symptoms  Reason For Call & Symptoms: Intermittent cough started Sun 2/2.  Cough is worse after being outside for awhile or at night.  Keeps waking at night with cough.  Tried cough drops, finally took Hycodan left over from 12/04/2010 episode of pneumonia.  Some sinus congestion that can be blown out.  Reviewed Health History In EMR: Yes  Reviewed Medications In EMR: Yes  Reviewed Allergies In EMR: Yes  Reviewed Surgeries / Procedures: Yes  Date of Onset of Symptoms: 04/06/2012  Treatments Tried: Tussonex OTC, Halls Cough Drops, Hycodan left form 12/04/2010  Treatments Tried Worked: No  Guideline(s) Used:  Cough  Disposition Per Guideline:   See Today or Tomorrow in Office  Reason For Disposition Reached:   Continuous (nonstop) coughing interferes with work or school and no improvement using cough treatment per Care Advice  Advice Given:  Cough Medicines:  OTC Cough Syrups: The most common cough suppressant in OTC cough medications is dextromethorphan. Often the letters "DM" appear in the name.  OTC Cough Drops: Cough drops can help a lot, especially for mild coughs. They reduce coughing by soothing your irritated throat and removing that tickle sensation in the back of the throat. Cough drops also have the advantage of portability - you can carry them with you.  Coughing Spasms:  Drink warm fluids. Inhale warm mist (Reason: both relax the airway and loosen up the phlegm).  Suck on cough drops or hard candy to coat the irritated throat.  Prevent Dehydration:  Drink adequate liquids.  This will help soothe an irritated or dry  throat and loosen up the phlegm.

## 2012-04-10 NOTE — Telephone Encounter (Signed)
OK to hold plavix 5 days prior to procedure and resume when OK with GI physician after procedure. Should stay on ASA if bleeding risk acceptable.

## 2012-04-10 NOTE — Telephone Encounter (Signed)
Pt has been notified.

## 2012-04-19 ENCOUNTER — Other Ambulatory Visit: Payer: Self-pay

## 2012-04-20 ENCOUNTER — Encounter: Payer: Self-pay | Admitting: Internal Medicine

## 2012-04-21 NOTE — Telephone Encounter (Signed)
Flu shot updated in pt's chart and message from pt saved.

## 2012-05-05 ENCOUNTER — Ambulatory Visit (AMBULATORY_SURGERY_CENTER): Payer: PRIVATE HEALTH INSURANCE | Admitting: Gastroenterology

## 2012-05-05 ENCOUNTER — Encounter: Payer: Self-pay | Admitting: Gastroenterology

## 2012-05-05 VITALS — BP 117/66 | HR 70 | Temp 97.5°F | Resp 15 | Ht 66.5 in | Wt 205.0 lb

## 2012-05-05 DIAGNOSIS — D126 Benign neoplasm of colon, unspecified: Secondary | ICD-10-CM

## 2012-05-05 DIAGNOSIS — K5730 Diverticulosis of large intestine without perforation or abscess without bleeding: Secondary | ICD-10-CM

## 2012-05-05 DIAGNOSIS — Z86010 Personal history of colonic polyps: Secondary | ICD-10-CM

## 2012-05-05 MED ORDER — SODIUM CHLORIDE 0.9 % IV SOLN: 500.0000 mL | INTRAVENOUS | Status: DC

## 2012-05-05 NOTE — Op Note (Signed)
[facility] [address] [place], [address]   COLONOSCOPY PROCEDURE REPORT  PATIENT: Jose [name], Jose [name]  MR#: [id number] BIRTHDATE: [birth year] , 61  yrs. old GENDER: Male ENDOSCOPIST: Rachael Fee, MD PROCEDURE DATE:  05/05/2012 PROCEDURE:   Colonoscopy with snare polypectomy ASA CLASS:   Class III INDICATIONS:several adenomatous polyps removed 2008. MEDICATIONS: Fentanyl 100 mcg IV, Versed 10 mg IV, and These medications were titrated to patient response per physician's verbal order  DESCRIPTION OF PROCEDURE:   After the risks benefits and alternatives of the procedure were thoroughly explained, informed consent was obtained.  A digital rectal exam revealed no abnormalities of the rectum.   The LB [id number]-[id number]  endoscope was introduced through the anus and advanced to the cecum, which was identified by both the appendix and ileocecal valve. No adverse events experienced.   The quality of the prep was good, using MoviPrep  The instrument was then slowly withdrawn as the colon was fully examined.  COLON FINDINGS: Three small polyps were found, removed and sent to pathology.  These were all sessile, located in descending, sigmoid and rectal segments; measuring 3-50mm across; removed with cold snare, sent to pathology.  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  The examination was otherwise normal.  Retroflexed views revealed no abnormalities. The time to cecum=1 minutes 14 seconds.  Withdrawal time=8 minutes 43 seconds.  The scope was withdrawn and the procedure completed. COMPLICATIONS: There were no complications.  ENDOSCOPIC IMPRESSION: Three small polyps were found, removed and sent to pathology. 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 The examination was otherwise normal.  RECOMMENDATIONS: You will likely need repeat colonoscopy in 3-5 years.  You will receive a letter within 1-2 weeks with the results of your biopsy as  well as final recommendations.  Please call my office if you have not received a letter after 3 weeks.   eSigned:  Rachael Fee, MD 05/05/2012 2:34 PM

## 2012-05-05 NOTE — Patient Instructions (Signed)
YOU HAD AN ENDOSCOPIC PROCEDURE TODAY AT THE [facility]: Refer to the procedure report that was given to you for any specific questions about what was found during the examination.  If the procedure report does not answer your questions, please call your gastroenterologist to clarify.  If you requested that your care partner not be given the details of your procedure findings, then the procedure report has been included in a sealed envelope for you to review at your convenience later.  YOU SHOULD EXPECT: Some feelings of bloating in the abdomen. Passage of more gas than usual.  Walking can help get rid of the air that was put into your GI tract during the procedure and reduce the bloating. If you had a lower endoscopy (such as a colonoscopy or flexible sigmoidoscopy) you may notice spotting of blood in your stool or on the toilet paper. If you underwent a bowel prep for your procedure, then you may not have a normal bowel movement for a few days.  DIET: Your first meal following the procedure should be a light meal and then it is ok to progress to your normal diet.  A half-sandwich or bowl of soup is an example of a good first meal.  Heavy or fried foods are harder to digest and may make you feel nauseous or bloated.  Likewise meals heavy in dairy and vegetables can cause extra gas to form and this can also increase the bloating.  Drink plenty of fluids but you should avoid alcoholic beverages for 24 hours.  ACTIVITY: Your care partner should take you home directly after the procedure.  You should plan to take it easy, moving slowly for the rest of the day.  You can resume normal activity the day after the procedure however you should NOT DRIVE or use heavy machinery for 24 hours (because of the sedation medicines used during the test).    SYMPTOMS TO REPORT IMMEDIATELY: A gastroenterologist can be reached at any hour.  During normal business hours, 8:30 AM to 5:00 PM Monday through Friday,  call [phone].  After hours and on weekends, please call the GI answering service at [phone] who will take a message and have the physician on call contact you.   Following lower endoscopy (colonoscopy or flexible sigmoidoscopy):  Excessive amounts of blood in the stool  Significant tenderness or worsening of abdominal pains  Swelling of the abdomen that is new, acute  Fever of 100F or higher    FOLLOW UP: If any biopsies were taken you will be contacted by phone or by letter within the next 1-3 weeks.  Call your gastroenterologist if you have not heard about the biopsies in 3 weeks.  Our staff will call the home number listed on your records the next business day following your procedure to check on you and address any questions or concerns that you may have at that time regarding the information given to you following your procedure. This is a courtesy call and so if there is no answer at the home number and we have not heard from you through the emergency physician on call, we will assume that you have returned to your regular daily activities without incident.  SIGNATURES/CONFIDENTIALITY: You and/or your care partner have signed paperwork which will be entered into your electronic medical record.  These signatures attest to the fact that that the information above on your After Visit Summary has been reviewed and is understood.  Full responsibility of the confidentiality   of this discharge information lies with you and/or your care-partner.     

## 2012-05-05 NOTE — Progress Notes (Signed)
Patient did not experience any of the following events: a burn prior to discharge; a fall within the facility; wrong site/side/patient/procedure/implant event; or a hospital transfer or hospital admission upon discharge from the facility. (G8907) Patient did not have preoperative order for IV antibiotic SSI prophylaxis. (G8918)  

## 2012-05-06 ENCOUNTER — Telehealth: Payer: Self-pay | Admitting: *Deleted

## 2012-05-06 NOTE — Telephone Encounter (Signed)
  Follow up Call-  Call back number 05/05/2012  Post procedure Call Back phone  # [phone]  Permission to leave phone message Yes     Patient questions:  Do you have a fever, pain , or abdominal swelling? no Pain Score  0 *  Have you tolerated food without any problems? yes  Have you been able to return to your normal activities? yes  Do you have any questions about your discharge instructions: Diet   no Medications  no Follow up visit  no  Do you have questions or concerns about your Care? no  Actions: * If pain score is 4 or above: No action needed, pain <4.

## 2012-05-12 ENCOUNTER — Encounter: Payer: Self-pay | Admitting: Gastroenterology

## 2012-05-16 ENCOUNTER — Other Ambulatory Visit: Payer: Self-pay

## 2012-05-16 MED ORDER — NITROGLYCERIN 0.4 MG SL SUBL: 0.4000 mg | SUBLINGUAL_TABLET | SUBLINGUAL | Status: DC | PRN

## 2012-05-20 ENCOUNTER — Other Ambulatory Visit (HOSPITAL_COMMUNITY): Payer: Self-pay | Admitting: *Deleted

## 2012-05-20 MED ORDER — NITROGLYCERIN 0.4 MG SL SUBL: 0.4000 mg | SUBLINGUAL_TABLET | SUBLINGUAL | Status: DC | PRN

## 2012-05-26 ENCOUNTER — Other Ambulatory Visit: Payer: Self-pay | Admitting: Internal Medicine

## 2012-06-10 ENCOUNTER — Ambulatory Visit (INDEPENDENT_AMBULATORY_CARE_PROVIDER_SITE_OTHER): Payer: PRIVATE HEALTH INSURANCE | Admitting: Internal Medicine

## 2012-06-10 ENCOUNTER — Encounter: Payer: Self-pay | Admitting: Internal Medicine

## 2012-06-10 VITALS — BP 136/84 | HR 71 | Temp 98.4°F | Resp 18 | Wt 207.0 lb

## 2012-06-10 DIAGNOSIS — E785 Hyperlipidemia, unspecified: Secondary | ICD-10-CM

## 2012-06-10 DIAGNOSIS — I10 Essential (primary) hypertension: Secondary | ICD-10-CM

## 2012-06-10 DIAGNOSIS — I2510 Atherosclerotic heart disease of native coronary artery without angina pectoris: Secondary | ICD-10-CM

## 2012-06-10 MED ORDER — PANTOPRAZOLE SODIUM 40 MG PO TBEC: DELAYED_RELEASE_TABLET | ORAL | Status: DC

## 2012-06-10 MED ORDER — CLOPIDOGREL BISULFATE 75 MG PO TABS: ORAL_TABLET | ORAL | Status: DC

## 2012-06-10 MED ORDER — PRAVASTATIN SODIUM 20 MG PO TABS: 20.0000 mg | ORAL_TABLET | Freq: Every day | ORAL | Status: DC

## 2012-06-10 MED ORDER — BENAZEPRIL HCL 40 MG PO TABS: ORAL_TABLET | ORAL | Status: DC

## 2012-06-10 MED ORDER — ALPRAZOLAM 0.5 MG PO TABS: 0.5000 mg | ORAL_TABLET | Freq: Three times a day (TID) | ORAL | Status: DC | PRN

## 2012-06-10 NOTE — Progress Notes (Signed)
Subjective:    Patient ID: Jose [name], male    DOB: 10-Jan-1951, 62 y.o.   MRN: [id number]  HPI 62 year old patient who is seen today in followup. He has a history of treated hypertension he also has a history of mild impaired glucose tolerance dyslipidemia and a history of coronary artery disease. He's had a recent followup colonoscopy due to his personal history of colonic polyps. Doing quite well today. Remains off alcohol and tobacco products. He's had a recent followup PSA at work that had normalized. Other laboratory studies were obtained and reviewed.  Past Medical History  Diagnosis Date  . History of TIA (transient ischemic attack) may 2010  . HYPERTENSION 09/18/2006  . GERD 09/18/2006  . 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  . ANXIETY 07/27/2008  . ALCOHOL ABUSE 07/27/2008  . LOW BACK PAIN 09/18/2006  . Rosacea 07/27/2008  . Hx of adenomatous colonic polyps   . Bifascicular block     beta blocker stopped due to profound bradycardia  . CAD 07/27/2008    a. s/p Endeavor DES to CFX 07/2008; residual OM1 70%, EF 45%;  b. relook cath 5/10: patent stent in CFX  . Urinary tract infection start cipro 01-07-2012  . SEIZURE DISORDER 09/18/2006    none since 2008  . Numbness and tingling     finger tips at times  . Myocardial infarction 2010    History   Social History  . Marital Status: Married    Spouse Name: N/A    Number of Children: 2  . Years of Education: N/A   Occupational History  . safety director/transportation    Social History Main Topics  . Smoking status: Never Smoker   . Smokeless tobacco: Former Neurosurgeon    Types: Chew    Quit date: 02/03/2012  . Alcohol Use: No     Comment: past drinker 2-3 daily and sometimes binge drinks on the weekend  . Drug Use: No  . Sexually Active: Not on file   Other Topics Concern  . Not on file   Social History Narrative  . No narrative on file    Past Surgical History  Procedure Laterality Date  . Knee surgery    . Coronary stent  placement      drug eluting  . Inguinal hernia repair  01/14/2012    Procedure: LAPAROSCOPIC INGUINAL HERNIA;  Surgeon: Axel Filler, MD;  Location: [facility];  Service: General;  Laterality: Right;  . Insertion of mesh  01/14/2012    Procedure: INSERTION OF MESH;  Surgeon: Axel Filler, MD;  Location: [facility];  Service: General;  Laterality: Right;  . Hernia repair      Family History  Problem Relation Age of Onset  . Lung cancer Mother   . Hypertension Mother   . Emphysema Father   . Heart attack Father   . Colon cancer Neg Hx     No Known Allergies  Current Outpatient Prescriptions on File Prior to Visit  Medication Sig Dispense Refill  . ALPRAZolam (XANAX) 0.5 MG tablet Take 0.5 mg by mouth 3 (three) times daily as needed. For anxiety      . aspirin 81 MG tablet Take 81 mg by mouth every morning.       . Azelaic Acid (FINACEA) 15 % cream Apply 1 application topically as directed. After skin is thoroughly washed and patted dry, gently but thoroughly massage a thin film of azelaic acid cream into the affected area twice daily, in the morning and evening. Applied to  face.      . benazepril (LOTENSIN) 40 MG tablet TAKE 1 TABLET (40 MG TOTAL) BY MOUTH DAILY.  90 tablet  0  . clopidogrel (PLAVIX) 75 MG tablet TAKE 1 TABLET (75 MG TOTAL) BY MOUTH DAILY.  30 tablet  5  . diclofenac sodium (VOLTAREN) 1 % GEL Apply 2 g topically 2 (two) times daily as needed. Apply to painful areas around knees      . halobetasol (ULTRAVATE) 0.05 % cream Apply 1 application topically as directed.       Marland Kitchen ibuprofen (ADVIL,MOTRIN) 200 MG tablet Take 200 mg by mouth every 6 (six) hours as needed.      . Multiple Vitamin (MULTIVITAMIN WITH MINERALS) TABS Take 1 tablet by mouth daily.      . nitroGLYCERIN (NITROSTAT) 0.4 MG SL tablet Place 1 tablet (0.4 mg total) under the tongue every 5 (five) minutes as needed. For chest pain  25 tablet  3  . pantoprazole (PROTONIX) 40 MG tablet TAKE 1 TABLET BY MOUTH EVERY  DAY  90 tablet  1  . vitamin C (ASCORBIC ACID) 500 MG tablet Take 500 mg by mouth daily. Only in fall and winter       No current facility-administered medications on file prior to visit.    BP 136/84  Pulse 71  Temp(Src) 98.4 F (36.9 C) (Oral)  Resp 18  Wt 207 lb (93.895 kg)  BMI 32.91 kg/m2  SpO2 98%        Review of Systems  Constitutional: Negative for fever, chills, appetite change and fatigue.  HENT: Negative for hearing loss, ear pain, congestion, sore throat, trouble swallowing, neck stiffness, dental problem, voice change and tinnitus.   Eyes: Negative for pain, discharge and visual disturbance.  Respiratory: Negative for cough, chest tightness, wheezing and stridor.   Cardiovascular: Negative for chest pain, palpitations and leg swelling.  Gastrointestinal: Negative for nausea, vomiting, abdominal pain, diarrhea, constipation, blood in stool and abdominal distention.  Genitourinary: Negative for urgency, hematuria, flank pain, discharge, difficulty urinating and genital sores.  Musculoskeletal: Negative for myalgias, back pain, joint swelling, arthralgias and gait problem.       Bilateral knee pain that interferes with activities such as walking  Skin: Negative for rash.  Neurological: Negative for dizziness, syncope, speech difficulty, weakness, numbness and headaches.  Hematological: Negative for adenopathy. Does not bruise/bleed easily.  Psychiatric/Behavioral: Negative for behavioral problems and dysphoric mood. The patient is not nervous/anxious.        Objective:   Physical Exam  Constitutional: He is oriented to person, place, and time. He appears well-developed.  Blood pressure 130/80  HENT:  Head: Normocephalic.  Right Ear: External ear normal.  Left Ear: External ear normal.  Eyes: Conjunctivae and EOM are normal.  Neck: Normal range of motion.  Cardiovascular: Normal rate and normal heart sounds.   Pulmonary/Chest: Breath sounds normal.   Abdominal: Bowel sounds are normal.  Musculoskeletal: Normal range of motion. He exhibits no edema and no tenderness.  Neurological: He is alert and oriented to person, place, and time.  Psychiatric: He has a normal mood and affect. His behavior is normal.          Assessment & Plan:   Hypertension Coronary artery disease History of elevated PSA. Now normalized Dyslipidemia. Continue simvastatin  All medicines refilled Recheck 6 months

## 2012-06-10 NOTE — Patient Instructions (Signed)
It is important that you exercise regularly, at least 20 minutes 3 to 4 times per week.  If you develop chest pain or shortness of breath seek  medical attention.  You need to lose weight.  Consider a lower calorie diet and regular exercise.  Return in 6 months for follow-up  Limit your sodium (Salt) intake

## 2012-07-16 ENCOUNTER — Encounter: Payer: Self-pay | Admitting: Internal Medicine

## 2012-07-21 ENCOUNTER — Encounter: Payer: Self-pay | Admitting: Internal Medicine

## 2012-10-03 ENCOUNTER — Other Ambulatory Visit: Payer: Self-pay | Admitting: Internal Medicine

## 2012-11-10 ENCOUNTER — Other Ambulatory Visit: Payer: Self-pay | Admitting: Internal Medicine

## 2012-12-12 ENCOUNTER — Encounter: Payer: Self-pay | Admitting: Internal Medicine

## 2012-12-12 ENCOUNTER — Ambulatory Visit (INDEPENDENT_AMBULATORY_CARE_PROVIDER_SITE_OTHER): Payer: PRIVATE HEALTH INSURANCE | Admitting: Internal Medicine

## 2012-12-12 VITALS — BP 124/80 | HR 75 | Temp 98.0°F | Wt 209.0 lb

## 2012-12-12 DIAGNOSIS — E785 Hyperlipidemia, unspecified: Secondary | ICD-10-CM

## 2012-12-12 DIAGNOSIS — I10 Essential (primary) hypertension: Secondary | ICD-10-CM

## 2012-12-12 DIAGNOSIS — R5381 Other malaise: Secondary | ICD-10-CM

## 2012-12-12 NOTE — Patient Instructions (Addendum)
Limit your sodium (Salt) intake    It is important that you exercise regularly, at least 20 minutes 3 to 4 times per week.  If you develop chest pain or shortness of breath seek  medical attention.  You need to lose weight.  Consider a lower calorie diet and regular exercise.1800 Calorie Diet for Diabetes Meal Planning The 1800 calorie diet is designed for eating up to 1800 calories each day. Following this diet and making healthy meal choices can help improve overall health. This diet controls blood sugar (glucose) levels and can also help lower blood pressure and cholesterol. SERVING SIZES Measuring foods and serving sizes helps to make sure you are getting the right amount of food. The list below tells how big or small some common serving sizes are:  1 oz.........4 stacked dice.  3 oz........Marland KitchenDeck of cards.  1 tsp.......Marland KitchenTip of little finger.  1 tbs......Marland KitchenMarland KitchenThumb.  2 tbs.......Marland KitchenGolf ball.   cup......Marland KitchenHalf of a fist.  1 cup.......Marland KitchenA fist. GUIDELINES FOR CHOOSING FOODS The goal of this diet is to eat a variety of foods and limit calories to 1800 each day. This can be done by choosing foods that are low in calories and fat. The diet also suggests eating small amounts of food frequently. Doing this helps control your blood glucose levels so they do not get too high or too low. Each meal or snack may include a protein food source to help you feel more satisfied and to stabilize your blood glucose. Try to eat about the same amount of food around the same time each day. This includes weekend days, travel days, and days off work. Space your meals about 4 to 5 hours apart and add a snack between them if you wish.  For example, a daily food plan could include breakfast, a morning snack, lunch, dinner, and an evening snack. Healthy meals and snacks include whole grains, vegetables, fruits, lean meats, poultry, fish, and dairy products. As you plan your meals, select a variety of foods. Choose from  the bread and starch, vegetable, fruit, dairy, and meat/protein groups. Examples of foods from each group and their suggested serving sizes are listed below. Use measuring cups and spoons to become familiar with what a healthy portion looks like. Bread and Starch Each serving equals 15 grams of carbohydrates.  1 slice bread.   bagel.   cup cold cereal (unsweetened).   cup hot cereal or mashed potatoes.  1 small potato (size of a computer mouse).   cup cooked pasta or rice.   English muffin.  1 cup broth-based soup.  3 cups of popcorn.  4 to 6 whole-wheat crackers.   cup cooked beans, peas, or corn. Vegetable Each serving equals 5 grams of carbohydrates.   cup cooked vegetables.  1 cup raw vegetables.   cup tomato or vegetable juice. Fruit Each serving equals 15 grams of carbohydrates.  1 small apple or orange.  1 cup watermelon or strawberries.   cup applesauce (no sugar added).  2 tbs raisins.   banana.   cup canned fruit, packed in water, its own juice, or sweetened with a sugar substitute.   cup unsweetened fruit juice. Dairy Each serving equals 12 to 15 grams of carbohydrates.  1 cup fat-free milk.  6 oz artificially sweetened yogurt or plain yogurt.  1 cup low-fat buttermilk.  1 cup soy milk.  1 cup almond milk. Meat/Protein  1 large egg.  2 to 3 oz meat, poultry, or fish.   cup low-fat cottage  cheese.  1 tbs peanut butter.  1 oz low-fat cheese.   cup tuna in water.   cup tofu. Fat  1 tsp oil.  1 tsp trans-fat-free margarine.  1 tsp butter.  1 tsp mayonnaise.  2 tbs avocado.  1 tbs salad dressing.  1 tbs cream cheese.  2 tbs sour cream. SAMPLE 1800 CALORIE DIET PLAN Breakfast   cup unsweetened cereal (1 carb serving).  1 cup fat-free milk (1 carb serving).  1 slice whole-wheat toast (1 carb serving).   small banana (1 carb serving).  1 scrambled egg.  1 tsp trans-fat-free  margarine. Lunch  Tuna sandwich.  2 slices whole-wheat bread (2 carb servings).   cup canned tuna in water, drained.  1 tbs reduced fat mayonnaise.  1 stalk celery, chopped.  2 slices tomato.  1 lettuce leaf.  1 cup carrot sticks.  24 to 30 seedless grapes (2 carb servings).  6 oz light yogurt (1 carb serving). Afternoon Snack  3 graham cracker squares (1 carb serving).  Fat-free milk, 1 cup (1 carb serving).  1 tbs peanut butter. Dinner  3 oz salmon, broiled with 1 tsp oil.  1 cup mashed potatoes (2 carb servings) with 1 tsp trans-fat-free margarine.  1 cup fresh or frozen green beans.  1 cup steamed asparagus.  1 cup fat-free milk (1 carb serving). Evening Snack  3 cups air-popped popcorn (1 carb serving).  2 tbs parmesan cheese sprinkled on top. MEAL PLAN Use this worksheet to help you make a daily meal plan based on the 1800 calorie diet suggestions. If you are using this plan to help you control your blood glucose, you may interchange carbohydrate-containing foods (dairy, starches, and fruits). Select a variety of fresh foods of varying colors and flavors. The total amount of carbohydrate in your meals or snacks is more important than making sure you include all of the food groups every time you eat. Choose from the following foods to build your day's meals:  8 Starches.  4 Vegetables.  3 Fruits.  2 Dairy.  6 to 7 oz Meat/Protein.  Up to 4 Fats. Your dietician can use this worksheet to help you decide how many servings and which types of foods are right for you. BREAKFAST Food Group and Servings / Food Choice Starch ________________________________________________________ Dairy _________________________________________________________ Fruit _________________________________________________________ Meat/Protein __________________________________________________ Fat ___________________________________________________________ LUNCH Food Group and  Servings / Food Choice Starch ________________________________________________________ Meat/Protein __________________________________________________ Vegetable _____________________________________________________ Fruit _________________________________________________________ Dairy _________________________________________________________ Fat ___________________________________________________________ Aura Fey Food Group and Servings / Food Choice Starch ________________________________________________________ Meat/Protein __________________________________________________ Fruit __________________________________________________________ Dairy _________________________________________________________ Laural Golden Food Group and Servings / Food Choice Starch _________________________________________________________ Meat/Protein ___________________________________________________ Dairy __________________________________________________________ Vegetable ______________________________________________________ Fruit ___________________________________________________________ Fat ____________________________________________________________ Lollie Sails Food Group and Servings / Food Choice Fruit __________________________________________________________ Meat/Protein ___________________________________________________ Dairy __________________________________________________________ Starch _________________________________________________________ DAILY TOTALS Starch ____________________________ Vegetable _________________________ Fruit _____________________________ Dairy _____________________________ Meat/Protein______________________ Fat _______________________________ Document Released: 09/11/2004 Document Revised: 05/14/2011 Document Reviewed: 01/05/2011 [employer] Patient Information 2014 [employer]. Diabetes and Exercise Regular exercise is important and can help:   Control  blood glucose (sugar).  Decrease blood pressure.    Control blood lipids (cholesterol, triglycerides).  Improve overall health. BENEFITS FROM EXERCISE  Improved fitness.  Improved flexibility.  Improved endurance.  Increased bone density.  Weight control.  Increased muscle strength.  Decreased body fat.  Improvement of the body's use of insulin, a hormone.  Increased insulin sensitivity.  Reduction of insulin needs.  Reduced stress and tension.  Helps you feel better. People with diabetes who  add exercise to their lifestyle gain additional benefits, including:  Weight loss.  Reduced appetite.  Improvement of the body's use of blood glucose.  Decreased risk factors for heart disease:  Lowering of cholesterol and triglycerides.  Raising the level of good cholesterol (high-density lipoproteins, HDL).  Lowering blood sugar.  Decreased blood pressure. TYPE 1 DIABETES AND EXERCISE  Exercise will usually lower your blood glucose.  If blood glucose is greater than 240 mg/dl, check urine ketones. If ketones are present, do not exercise.  Location of the insulin injection sites may need to be adjusted with exercise. Avoid injecting insulin into areas of the body that will be exercised. For example, avoid injecting insulin into:  The arms when playing tennis.  The legs when jogging. For more information, discuss this with your caregiver.  Keep a record of:  Food intake.  Type and amount of exercise.  Expected peak times of insulin action.  Blood glucose levels. Do this before, during, and after exercise. Review your records with your caregiver. This will help you to develop guidelines for adjusting food intake and insulin amounts.  TYPE 2 DIABETES AND EXERCISE  Regular physical activity can help control blood glucose.  Exercise is important because it may:  Increase the body's sensitivity to insulin.  Improve blood glucose control.  Exercise  reduces the risk of heart disease. It decreases serum cholesterol and triglycerides. It also lowers blood pressure.  Those who take insulin or oral hypoglycemic agents should watch for signs of hypoglycemia. These signs include dizziness, shaking, sweating, chills, and confusion.  Body water is lost during exercise. It must be replaced. This will help to avoid loss of body fluids (dehydration) or heat stroke. Be sure to talk to your caregiver before starting an exercise program to make sure it is safe for you. Remember, any activity is better than none.  Document Released: 05/12/2003 Document Revised: 05/14/2011 Document Reviewed: 08/26/2008 [facility] Patient Information 2014 [place], [place]. Diabetes Meal Planning Guide The diabetes meal planning guide is a tool to help you plan your meals and snacks. It is important for people with diabetes to manage their blood glucose (sugar) levels. Choosing the right foods and the right amounts throughout your day will help control your blood glucose. Eating right can even help you improve your blood pressure and reach or maintain a healthy weight. CARBOHYDRATE COUNTING MADE EASY When you eat carbohydrates, they turn to sugar. This raises your blood glucose level. Counting carbohydrates can help you control this level so you feel better. When you plan your meals by counting carbohydrates, you can have more flexibility in what you eat and balance your medicine with your food intake. Carbohydrate counting simply means adding up the total amount of carbohydrate grams in your meals and snacks. Try to eat about the same amount at each meal. Foods with carbohydrates are listed below. Each portion below is 1 carbohydrate serving or 15 grams of carbohydrates. Ask your dietician how many grams of carbohydrates you should eat at each meal or snack. Grains and Starches  1 slice bread.   English muffin or hotdog/hamburger bun.   cup cold cereal (unsweetened).   cup  cooked pasta or rice.   cup starchy vegetables (corn, potatoes, peas, beans, winter squash).  1 tortilla (6 inches).   bagel.  1 waffle or pancake (size of a CD).   cup cooked cereal.  4 to 6 small crackers. *Whole grain is recommended. Fruit  1 cup fresh unsweetened berries, melon, papaya, pineapple.  1 small fresh fruit.   banana or mango.   cup fruit juice (4 oz unsweetened).   cup canned fruit in natural juice or water.  2 tbs dried fruit.  12 to 15 grapes or cherries. Milk and Yogurt  1 cup fat-free or 1% milk.  1 cup soy milk.  6 oz light yogurt with sugar-free sweetener.  6 oz low-fat soy yogurt.  6 oz plain yogurt. Vegetables  1 cup raw or  cup cooked is counted as 0 carbohydrates or a "free" food.  If you eat 3 or more servings at 1 meal, count them as 1 carbohydrate serving. Other Carbohydrates   oz chips or pretzels.   cup ice cream or frozen yogurt.   cup sherbet or sorbet.  2 inch square cake, no frosting.  1 tbs honey, sugar, jam, jelly, or syrup.  2 small cookies.  3 squares of graham crackers.  3 cups popcorn.  6 crackers.  1 cup broth-based soup.  Count 1 cup casserole or other mixed foods as 2 carbohydrate servings.  Foods with less than 20 calories in a serving may be counted as 0 carbohydrates or a "free" food. You may want to purchase a book or computer software that lists the carbohydrate gram counts of different foods. In addition, the nutrition facts panel on the labels of the foods you eat are a good source of this information. The label will tell you how big the serving size is and the total number of carbohydrate grams you will be eating per serving. Divide this number by 15 to obtain the number of carbohydrate servings in a portion. Remember, 1 carbohydrate serving equals 15 grams of carbohydrate. SERVING SIZES Measuring foods and serving sizes helps you make sure you are getting the right amount of food.  The list below tells how big or small some common serving sizes are.  1 oz.........4 stacked dice.  3 oz........Marland KitchenDeck of cards.  1 tsp.......Marland KitchenTip of little finger.  1 tbs......Marland KitchenMarland KitchenThumb.  2 tbs.......Marland KitchenGolf ball.   cup......Marland KitchenHalf of a fist.  1 cup.......Marland KitchenA fist. SAMPLE DIABETES MEAL PLAN Below is a sample meal plan that includes foods from the grain and starches, dairy, vegetable, fruit, and meat groups. A dietician can individualize a meal plan to fit your calorie needs and tell you the number of servings needed from each food group. However, controlling the total amount of carbohydrates in your meal or snack is more important than making sure you include all of the food groups at every meal. You may interchange carbohydrate containing foods (dairy, starches, and fruits). The meal plan below is an example of a 2000 calorie diet using carbohydrate counting. This meal plan has 17 carbohydrate servings. Breakfast  1 cup oatmeal (2 carb servings).   cup light yogurt (1 carb serving).  1 cup blueberries (1 carb serving).   cup almonds. Snack  1 large apple (2 carb servings).  1 low-fat string cheese stick. Lunch  Chicken breast salad.  1 cup spinach.   cup chopped tomatoes.  2 oz chicken breast, sliced.  2 tbs low-fat [place] dressing.  12 whole-wheat crackers (2 carb servings).  12 to 15 grapes (1 carb serving).  1 cup low-fat milk (1 carb serving). Snack  1 cup carrots.   cup hummus (1 carb serving). Dinner  3 oz broiled salmon.  1 cup brown rice (3 carb servings). Snack  1  cups steamed broccoli (1 carb serving) drizzled with 1 tsp olive oil and lemon juice.  1  cup light pudding (2 carb servings). DIABETES MEAL PLANNING WORKSHEET Your dietician can use this worksheet to help you decide how many servings of foods and what types of foods are right for you.  BREAKFAST Food Group and Servings / Carb Servings Grain/Starches  __________________________________ Dairy __________________________________________ Vegetable ______________________________________ Fruit ___________________________________________ Meat __________________________________________ Fat ____________________________________________ LUNCH Food Group and Servings / Carb Servings Grain/Starches ___________________________________ Dairy ___________________________________________ Fruit ____________________________________________ Meat ___________________________________________ Fat _____________________________________________ Laural Golden Food Group and Servings / Carb Servings Grain/Starches ___________________________________ Dairy ___________________________________________ Fruit ____________________________________________ Meat ___________________________________________ Fat _____________________________________________ SNACKS Food Group and Servings / Carb Servings Grain/Starches ___________________________________ Dairy ___________________________________________ Vegetable _______________________________________ Fruit ____________________________________________ Meat ___________________________________________ Fat _____________________________________________ DAILY TOTALS Starches _________________________ Vegetable ________________________ Fruit ____________________________ Dairy ____________________________ Meat ____________________________ Fat ______________________________ Document Released: 11/16/2004 Document Revised: 05/14/2011 Document Reviewed: 09/27/2008 [employer] Patient Information 2014 [employer]. Diabetes, Eating Away From Home Sometimes, you might eat in a restaurant or have meals that are prepared by someone else. You can enjoy eating out. However, the portions in restaurants may be much larger than needed. Listed below are some ideas to help you choose foods that will keep your blood glucose (sugar) in better  control.  TIPS FOR EATING OUT  Know your meal plan and how many carbohydrate servings you should have at each meal. You may wish to carry a copy of your meal plan in your purse or wallet. Learn the foods included in each food group.  Make a list of restaurants near you that offer healthy choices. Take a copy of the carry-out menus to see what they offer. Then, you can plan what you will order ahead of time.  Become familiar with serving sizes by practicing them at home using measuring cups and spoons. Once you learn to recognize portion sizes, you will be able to correctly estimate the amount of total carbohydrate you are allowed to eat at the restaurant. Ask for a takeout box if the portion is more than you should have. When your food comes, leave the amount you should have on the plate, and put the rest in the takeout box before you start eating.  Plan ahead if your mealtime will be different from usual. Check with your caregiver to find out how to time meals and medicine if you are taking insulin.  Avoid high-fat foods, such as fried foods, cream sauces, high-fat salad dressings, or any added butter or margarine.  Do not be afraid to ask questions. Ask your server about the portion size, cooking methods, ingredients and if items can be substituted. Restaurants do not list all available items on the menu. You can ask for your main entree to be prepared using skim milk, oil instead of butter or margarine, and without gravy or sauces. Ask your waiter or waitress to serve salad dressings, gravy, sauces, margarine, and sour cream on the side. You can then add the amount your meal plan suggests.  Add more vegetables whenever possible.  Avoid items that are labeled "jumbo," "giant," "deluxe," or "supersized."  You may want to split an entre with someone and order an extra side salad.  Watch for hidden calories in foods like croutons, bacon, or cheese.  Ask your server to take away the bread  basket or chips from your table.  Order a dinner salad as an appetizer. You can eat most foods served in a restaurant. Some foods are better choices than others. Breads and Starches  Recommended: All kinds of bread (wheat, rye, white, oatmeal, [place], [place], raisin), hard or soft dinner  rolls, frankfurter or hamburger buns, small bagels, small corn or whole-wheat flour tortillas.  Avoid: Frosted or glazed breads, butter rolls, egg or cheese breads, croissants, sweet rolls, pastries, coffee cake, glazed or frosted doughnuts, muffins. Crackers  Recommended: Animal crackers, graham, rye, saltine, oyster, and matzoth crackers. Bread sticks, melba toast, rusks, pretzels, popcorn (without fat), zwieback toast.  Avoid: High-fat snack crackers or chips. Buttered popcorn. Cereals  Recommended: Hot and cold cereals. Whole grains such as oatmeal or shredded wheat are good choices.  Avoid: Sugar-coated or granola type cereals. Potatoes/Pasta/Rice/Beans  Recommended: Order baked, boiled, or mashed potatoes, rice or noodles without added fat, whole beans. Order gravies, butter, margarine, or sauces on the side so you can control the amount you add.  Avoid: Hash browns or fried potatoes. Potatoes, pasta, or rice prepared with cream or cheese sauce. Potato or pasta salads prepared with large amounts of dressing. Fried beans or fried rice. Vegetables  Recommended: Order steamed, baked, boiled, or stewed vegetables without sauces or extra fat. Ask that sauce be served on the side. If vegetables are not listed on the menu, ask what is available.  Avoid: Vegetables prepared with cream, butter, or cheese sauce. Fried vegetables. Salad Bars  Recommended: Many of the vegetables at a salad bar are considered "free." Use lemon juice, vinegar, or low-calorie salad dressing (fewer than 20 calories per serving) as "free" dressings for your salad. Look for salad bar ingredients that have no added fat or sugar  such as tomatoes, lettuce, cucumbers, broccoli, carrots, onions, and mushrooms.  Avoid: Prepared salads with large amounts of dressing, such as coleslaw, caesar salad, macaroni salad, bean salad, or carrot salad. Fruit  Recommended: Eat fresh fruit or fresh fruit salad without added dressing. A salad bar often offers fresh fruit choices, but canned fruit at a restaurant is usually packed in sugar or syrup.  Avoid: Sweetened canned or frozen fruits, plain or sweetened fruit juice. Fruit salads with dressing, sour cream, or sugar added to them. Meat and Meat Substitutes  Recommended: Order broiled, baked, roasted, or grilled meat, poultry, or fish. Trim off all visible fat. Do not eat the skin of poultry. The size stated on the menu is the raw weight. Meat shrinks by  in cooking (for example, 4 oz raw equals 3 oz cooked meat).  Avoid: Deep-fat fried meat, poultry, or fish. Breaded meats. Eggs  Recommended: Order soft, hard-cooked, poached, or scrambled eggs. Omelets may be okay, depending on what ingredients are added. Egg substitutes are also a good choice.  Avoid: Fried eggs, eggs prepared with cream or cheese sauce. Milk  Recommended: Order low-fat or fat-free milk according to your meal plan. Plain, nonfat yogurt or flavored yogurt with no sugar added may be used as a substitute for milk. Soy milk may also be used.  Avoid: Milk shakes or sweetened milk beverages. Soups and Combination Foods  Recommended: Clear broth or consomm are "free" foods and may be used as an appetizer. Broth-based soups with fat removed count as a starch serving and are preferred over cream soups. Soups made with beans or split peas may be eaten but count as a starch.  Avoid: Fatty soups, soup made with cream, cheese soup. Combination foods prepared with excessive amounts of fat or with cream or cheese sauces. Desserts and Sweets  Recommended: Ask for fresh fruit. Sponge or angel food cake without icing, ice  milk, no sugar added ice cream, sherbet, or frozen yogurt may fit into your meal plan occasionally.  Avoid: Pastries, puddings, pies, cakes with icing, custard, gelatin desserts. Fats and Oils  Recommended: Choose healthy fats such as olive oil, canola oil, or tub margarine, reduced fat or fat-free sour cream, cream cheese, avocado, or nuts.  Avoid: Any fats in excess of your allowed portion. Deep-fried foods or any food with a large amount of fat. Note: Ask for all fats to be served on the side, and limit your portion sizes according to your meal plan. Document Released: 02/19/2005 Document Revised: 05/14/2011 Document Reviewed: 09/09/2008 [facility] Patient Information 2014 [place], [place]. Diets for Diabetes, Food Labeling Look at food labels to help you decide how much of a product you can eat. You will want to check the amount of total carbohydrate in a serving to see how the food fits into your meal plan. In the list of ingredients, the ingredient present in the largest amount by weight must be listed first, followed by the other ingredients in descending order. STANDARD OF IDENTITY Most products have a list of ingredients. However, foods that the Food and Drug Administration (FDA) has given a standard of identity do not need a list of ingredients. A standard of identity means that a food must contain certain ingredients if it is called a particular name. Examples are mayonnaise, peanut butter, ketchup, jelly, and cheese. LABELING TERMS There are many terms found on food labels. Some of these terms have specific definitions. Some terms are regulated by the FDA, and the FDA has clearly specified how they can be used. Others are not regulated or well-defined and can be misleading and confusing. SPECIFICALLY DEFINED TERMS Nutritive Sweetener.  A sweetener that contains calories,such as table sugar or honey. Nonnutritive Sweetener.  A sweetener with few or no calories,such as saccharin,  aspartame, sucralose, and cyclamate. LABELING TERMS REGULATED BY THE FDA Free.  The product contains only a tiny or small amount of fat, cholesterol, sodium, sugar, or calories. For example, a "fat-free" product will contain less than 0.5 g of fat per serving. Low.  A food described as "low" in fat, saturated fat, cholesterol, sodium, or calories could be eaten fairly often without exceeding dietary guidelines. For example, "low in fat" means no more than 3 g of fat per serving. Lean.  "Lean" and "extra lean" are U.S. Department of Agriculture Architect) terms for use on meat and poultry products. "Lean" means the product contains less than 10 g of fat, 4 g of saturated fat, and 95 mg of cholesterol per serving. "Lean" is not as low in fat as a product labeled "low." Extra Lean.  "Extra lean" means the product contains less than 5 g of fat, 2 g of saturated fat, and 95 mg of cholesterol per serving. While "extra lean" has less fat than "lean," it is still higher in fat than a product labeled "low." Reduced, Less, Fewer.  A diet product that contains 25% less of a nutrient or calories than the regular version. For example, hot dogs might be labeled "25% less fat than our regular hot dogs." Light/Lite.  A diet product that contains  fewer calories or  the fat of the original. For example, "light in sodium" means a product with  the usual sodium. More.  One serving contains at least 10% more of the daily value of a vitamin, mineral, or fiber than usual. Good Source Of.  One serving contains 10% to 19% of the daily value for a particular vitamin, mineral, or fiber. Excellent Source Of.  One serving contains 20% or  more of the daily value for a particular nutrient. Other terms used might be "high in" or "rich in." Enriched or Fortified.  The product contains added vitamins, minerals, or protein. Nutrition labeling must be used on enriched or fortified foods. Imitation.  The product has  been altered so that it is lower in protein, vitamins, or minerals than the usual food,such as imitation peanut butter. Total Fat.  The number listed is the total of all fat found in a serving of the product. Under total fat, food labels must list saturated fat and trans fat, which are associated with raising bad cholesterol and an increased risk of heart blood vessel disease. Saturated Fat.  Mainly fats from animal-based sources. Some examples are red meat, cheese, cream, whole milk, and coconut oil. Trans Fat.  Found in some fried snack foods, packaged foods, and fried restaurant foods. It is recommended you eat as close to 0 g of trans fat as possible, since it raises bad cholesterol and lowers good cholesterol. Polyunsaturated and Monounsaturated Fats.  More healthful fats. These fats are from plant sources. Total Carbohydrate.  The number of carbohydrate grams in a serving of the product. Under total carbohydrate are listed the other carbohydrate sources, such as dietary fiber and sugars. Dietary Fiber.  A carbohydrate from plant sources. Sugars.  Sugars listed on the label contain all naturally occurring sugars as well as added sugars. LABELING TERMS NOT REGULATED BY THE FDA Sugarless.  Table sugar (sucrose) has not been added. However, the manufacturer may use another form of sugar in place of sucrose to sweeten the product. For example, sugar alcohols are used to sweeten foods. Sugar alcohols are a form of sugar but are not table sugar. If a product contains sugar alcohols in place of sucrose, it can still be labeled "sugarless." Low Salt, Salt-Free, Unsalted, No Salt, No Salt Added, Without Added Salt.  Food that is usually processed with salt has been made without salt. However, the food may contain sodium-containing additives, such as preservatives, leavening agents, or flavorings. Natural.  This term has no legal meaning. Organic.  Foods that are certified as organic  have been inspected and approved by the USDA to ensure they are produced without pesticides, fertilizers containing synthetic ingredients, bioengineering, or ionizing radiation. Document Released: 02/22/2003 Document Revised: 05/14/2011 Document Reviewed: 09/09/2008 [facility] Patient Information 2014 [place], [place].

## 2012-12-12 NOTE — Progress Notes (Signed)
Subjective:    Patient ID: Jose [name], male    DOB: [birth year], 62 y.o.   MRN: [id number]  HPI  62 year old patient who is in today for his biannual followup. He has treated hypertension and dyslipidemia. He has some stable but chronic fatigue but otherwise has done quite well. He did have some comprehensive laboratory studies done at work that were reviewed. He has impaired glucose tolerance. No new concerns or complaints.  Wt Readings from Last 3 Encounters:  12/12/12 209 lb (94.802 kg)  06/10/12 207 lb (93.895 kg)  05/05/12 205 lb (92.987 kg)    BP Readings from Last 3 Encounters:  12/12/12 124/80  06/10/12 136/84  05/05/12 117/66   Past Medical History  Diagnosis Date  . History of TIA (transient ischemic attack) may 2010  . HYPERTENSION 09/18/2006  . GERD 09/18/2006  . 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  . ANXIETY 07/27/2008  . ALCOHOL ABUSE 07/27/2008  . LOW BACK PAIN 09/18/2006  . Rosacea 07/27/2008  . Hx of adenomatous colonic polyps   . Bifascicular block     beta blocker stopped due to profound bradycardia  . CAD 07/27/2008    a. s/p Endeavor DES to CFX 07/2008; residual OM1 70%, EF 45%;  b. relook cath 5/10: patent stent in CFX  . Urinary tract infection start cipro 01-07-2012  . SEIZURE DISORDER 09/18/2006    none since 2008  . Numbness and tingling     finger tips at times  . Myocardial infarction 2010    History   Social History  . Marital Status: Married    Spouse Name: N/A    Number of Children: 2  . Years of Education: N/A   Occupational History  . safety director/transportation    Social History Main Topics  . Smoking status: Never Smoker   . Smokeless tobacco: Former Neurosurgeon    Types: Chew    Quit date: 02/03/2012  . Alcohol Use: No     Comment: past drinker 2-3 daily and sometimes binge drinks on the weekend  . Drug Use: No  . Sexual Activity: Not on file   Other Topics Concern  . Not on file   Social History Narrative  . No narrative on file     Past Surgical History  Procedure Laterality Date  . Knee surgery    . Coronary stent placement      drug eluting  . Inguinal hernia repair  01/14/2012    Procedure: LAPAROSCOPIC INGUINAL HERNIA;  Surgeon: Axel Filler, MD;  Location: [facility];  Service: General;  Laterality: Right;  . Insertion of mesh  01/14/2012    Procedure: INSERTION OF MESH;  Surgeon: Axel Filler, MD;  Location: [facility];  Service: General;  Laterality: Right;  . Hernia repair      Family History  Problem Relation Age of Onset  . Lung cancer Mother   . Hypertension Mother   . Emphysema Father   . Heart attack Father   . Colon cancer Neg Hx     No Known Allergies  Current Outpatient Prescriptions on File Prior to Visit  Medication Sig Dispense Refill  . ALPRAZolam (XANAX) 0.5 MG tablet Take 1 tablet (0.5 mg total) by mouth 3 (three) times daily as needed. For anxiety  60 tablet  3  . aspirin 81 MG tablet Take 81 mg by mouth every morning.       . Azelaic Acid (FINACEA) 15 % cream Apply 1 application topically as directed. After skin is thoroughly washed and  patted dry, gently but thoroughly massage a thin film of azelaic acid cream into the affected area twice daily, in the morning and evening. Applied to face.      . benazepril (LOTENSIN) 40 MG tablet TAKE 1 TABLET (40 MG TOTAL) BY MOUTH DAILY.  90 tablet  6  . clopidogrel (PLAVIX) 75 MG tablet TAKE 1 TABLET (75 MG TOTAL) BY MOUTH DAILY.  90 tablet  5  . diclofenac sodium (VOLTAREN) 1 % GEL Apply 2 g topically 2 (two) times daily as needed. Apply to painful areas around knees      . halobetasol (ULTRAVATE) 0.05 % cream Apply 1 application topically as directed.       Marland Kitchen ibuprofen (ADVIL,MOTRIN) 200 MG tablet Take 200 mg by mouth every 6 (six) hours as needed.      . Multiple Vitamin (MULTIVITAMIN WITH MINERALS) TABS Take 1 tablet by mouth daily.      . nitroGLYCERIN (NITROSTAT) 0.4 MG SL tablet Place 1 tablet (0.4 mg total) under the tongue every 5  (five) minutes as needed. For chest pain  25 tablet  3  . pantoprazole (PROTONIX) 20 MG tablet TAKE 2 TABLETS BY MOUTH EVERY DAY  180 tablet  3  . pantoprazole (PROTONIX) 40 MG tablet TAKE 1 TABLET BY MOUTH EVERY DAY  90 tablet  6  . pravastatin (PRAVACHOL) 20 MG tablet Take 1 tablet (20 mg total) by mouth daily.  90 tablet  6  . pravastatin (PRAVACHOL) 20 MG tablet TAKE 1 TABLET (20 MG TOTAL) BY MOUTH DAILY.  90 tablet  1  . vitamin C (ASCORBIC ACID) 500 MG tablet Take 500 mg by mouth daily. Only in fall and winter       No current facility-administered medications on file prior to visit.    BP 124/80  Pulse 75  Temp(Src) 98 F (36.7 C) (Oral)  Wt 209 lb (94.802 kg)  BMI 33.23 kg/m2  SpO2 96%    Review of Systems  Constitutional: Positive for fatigue. Negative for fever, chills and appetite change.  HENT: Negative for congestion, dental problem, ear pain, hearing loss, sore throat, tinnitus, trouble swallowing and voice change.   Eyes: Negative for pain, discharge and visual disturbance.  Respiratory: Negative for cough, chest tightness, wheezing and stridor.   Cardiovascular: Negative for chest pain, palpitations and leg swelling.  Gastrointestinal: Negative for nausea, vomiting, abdominal pain, diarrhea, constipation, blood in stool and abdominal distention.  Genitourinary: Negative for urgency, hematuria, flank pain, discharge, difficulty urinating and genital sores.  Musculoskeletal: Negative for arthralgias, back pain, gait problem, joint swelling, myalgias and neck stiffness.  Skin: Negative for rash.  Neurological: Negative for dizziness, syncope, speech difficulty, weakness, numbness and headaches.  Hematological: Negative for adenopathy. Does not bruise/bleed easily.  Psychiatric/Behavioral: Negative for behavioral problems and dysphoric mood. The patient is not nervous/anxious.        Objective:   Physical Exam  Constitutional: He is oriented to person, place, and  time. He appears well-developed.  Pressure 120/74  HENT:  Head: Normocephalic.  Right Ear: External ear normal.  Left Ear: External ear normal.  Eyes: Conjunctivae and EOM are normal.  Neck: Normal range of motion.  Cardiovascular: Normal rate and normal heart sounds.   Pulmonary/Chest: Breath sounds normal.  Abdominal: Bowel sounds are normal.  Musculoskeletal: Normal range of motion. He exhibits no edema and no tenderness.  Neurological: He is alert and oriented to person, place, and time.  Psychiatric: He has a normal mood and affect.  His behavior is normal.          Assessment & Plan:   Impaired glucose tolerance. Direction size modest weight loss encouraged Hypertension stable Dyslipidemia  Recheck 6 months

## 2013-01-05 ENCOUNTER — Other Ambulatory Visit: Payer: Self-pay | Admitting: Internal Medicine

## 2013-01-08 ENCOUNTER — Other Ambulatory Visit: Payer: Self-pay

## 2013-03-10 ENCOUNTER — Other Ambulatory Visit: Payer: Self-pay | Admitting: *Deleted

## 2013-03-10 ENCOUNTER — Encounter: Payer: Self-pay | Admitting: Internal Medicine

## 2013-03-10 MED ORDER — ALPRAZOLAM 0.5 MG PO TABS: 0.5000 mg | ORAL_TABLET | Freq: Three times a day (TID) | ORAL | Status: DC | PRN

## 2013-03-22 ENCOUNTER — Encounter: Payer: Self-pay | Admitting: Internal Medicine

## 2013-04-01 ENCOUNTER — Other Ambulatory Visit: Payer: Self-pay | Admitting: Internal Medicine

## 2013-06-12 ENCOUNTER — Ambulatory Visit: Payer: PRIVATE HEALTH INSURANCE | Admitting: Internal Medicine

## 2013-06-25 ENCOUNTER — Encounter: Payer: Self-pay | Admitting: Internal Medicine

## 2013-06-25 ENCOUNTER — Ambulatory Visit (INDEPENDENT_AMBULATORY_CARE_PROVIDER_SITE_OTHER): Payer: 59 | Admitting: Internal Medicine

## 2013-06-25 VITALS — BP 128/78 | HR 51 | Temp 97.9°F | Resp 20 | Ht 66.5 in | Wt 209.0 lb

## 2013-06-25 DIAGNOSIS — E739 Lactose intolerance, unspecified: Secondary | ICD-10-CM

## 2013-06-25 DIAGNOSIS — I10 Essential (primary) hypertension: Secondary | ICD-10-CM

## 2013-06-25 DIAGNOSIS — E785 Hyperlipidemia, unspecified: Secondary | ICD-10-CM

## 2013-06-25 DIAGNOSIS — I2510 Atherosclerotic heart disease of native coronary artery without angina pectoris: Secondary | ICD-10-CM

## 2013-06-25 DIAGNOSIS — Z23 Encounter for immunization: Secondary | ICD-10-CM

## 2013-06-25 NOTE — Progress Notes (Signed)
Subjective:    Patient ID: Jose [name], male    DOB: [birth year], 63 y.o.   MRN: [id number]  HPI  63 year old patient who is seen today in followup.  He has a history of alcohol use, but has been abstinent for a number of years.  He has coronary artery disease, which has been stable.  He has treated hypertension, and dyslipidemia. He has chronic right knee pain and is followed by orthopedics.  He feels that he will probably require total knee replacement surgery within the next year. He has a history of impaired glucose tolerance.  Laboratory studies from work were reviewed today.  They revealed a hemoglobin A1c of 6 point 5  Wt Readings from Last 3 Encounters:  06/25/13 209 lb (94.802 kg)  12/12/12 209 lb (94.802 kg)  06/10/12 207 lb (93.895 kg)    Past Medical History  Diagnosis Date  . History of TIA (transient ischemic attack) may 2010  . HYPERTENSION 09/18/2006  . GERD 09/18/2006  . 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  . ANXIETY 07/27/2008  . ALCOHOL ABUSE 07/27/2008  . LOW BACK PAIN 09/18/2006  . Rosacea 07/27/2008  . Hx of adenomatous colonic polyps   . Bifascicular block     beta blocker stopped due to profound bradycardia  . CAD 07/27/2008    a. s/p Endeavor DES to CFX 07/2008; residual OM1 70%, EF 45%;  b. relook cath 5/10: patent stent in CFX  . Urinary tract infection start cipro 01-07-2012  . SEIZURE DISORDER 09/18/2006    none since 2008  . Numbness and tingling     finger tips at times  . Myocardial infarction 2010    History   Social History  . Marital Status: Married    Spouse Name: N/A    Number of Children: 2  . Years of Education: N/A   Occupational History  . safety director/transportation    Social History Main Topics  . Smoking status: Never Smoker   . Smokeless tobacco: Former Systems developer    Types: [place] date: 02/03/2012  . Alcohol Use: No     Comment: past drinker 2-3 daily and sometimes binge drinks on the weekend  . Drug Use: No  . Sexual Activity:  Not on file   Other Topics Concern  . Not on file   Social History Narrative  . No narrative on file    Past Surgical History  Procedure Laterality Date  . Knee surgery    . Coronary stent placement      drug eluting  . Inguinal hernia repair  01/14/2012    Procedure: LAPAROSCOPIC INGUINAL HERNIA;  Surgeon: Ralene Ok, MD;  Location: [facility];  Service: General;  Laterality: Right;  . Insertion of mesh  01/14/2012    Procedure: INSERTION OF MESH;  Surgeon: Ralene Ok, MD;  Location: [facility];  Service: General;  Laterality: Right;  . Hernia repair      Family History  Problem Relation Age of Onset  . Lung cancer Mother   . Hypertension Mother   . Emphysema Father   . Heart attack Father   . Colon cancer Neg Hx     No Known Allergies  Current Outpatient Prescriptions on File Prior to Visit  Medication Sig Dispense Refill  . ALPRAZolam (XANAX) 0.5 MG tablet Take 1 tablet (0.5 mg total) by mouth 3 (three) times daily as needed. For anxiety  60 tablet  5  . aspirin 81 MG tablet Take 81 mg by mouth  every morning.       . Azelaic Acid (FINACEA) 15 % cream Apply 1 application topically as directed. After skin is thoroughly washed and patted dry, gently but thoroughly massage a thin film of azelaic acid cream into the affected area twice daily, in the morning and evening. Applied to face.      . benazepril (LOTENSIN) 40 MG tablet TAKE 1 TABLET (40 MG TOTAL) BY MOUTH DAILY.  90 tablet  6  . clopidogrel (PLAVIX) 75 MG tablet TAKE 1 TABLET (75 MG TOTAL) BY MOUTH DAILY.  90 tablet  5  . diclofenac sodium (VOLTAREN) 1 % GEL Apply 2 g topically 2 (two) times daily as needed. Apply to painful areas around knees      . halobetasol (ULTRAVATE) 0.05 % cream Apply 1 application topically as directed.       Marland Kitchen ibuprofen (ADVIL,MOTRIN) 200 MG tablet Take 200 mg by mouth every 6 (six) hours as needed.      . Multiple Vitamin (MULTIVITAMIN WITH MINERALS) TABS Take 1 tablet by mouth daily.       . nitroGLYCERIN (NITROSTAT) 0.4 MG SL tablet Place 1 tablet (0.4 mg total) under the tongue every 5 (five) minutes as needed. For chest pain  25 tablet  3  . pantoprazole (PROTONIX) 40 MG tablet TAKE 1 TABLET BY MOUTH EVERY DAY  90 tablet  6  . pravastatin (PRAVACHOL) 20 MG tablet Take 1 tablet (20 mg total) by mouth daily.  90 tablet  6  . pravastatin (PRAVACHOL) 20 MG tablet TAKE 1 TABLET (20 MG TOTAL) BY MOUTH DAILY.  90 tablet  1   No current facility-administered medications on file prior to visit.    BP 128/78  Pulse 51  Temp(Src) 97.9 F (36.6 C) (Oral)  Resp 20  Ht 5' 6.5" (1.689 m)  Wt 209 lb (94.802 kg)  BMI 33.23 kg/m2  SpO2 98%     Review of Systems  Constitutional: Negative for fever, chills, appetite change and fatigue.  HENT: Negative for congestion, dental problem, ear pain, hearing loss, sore throat, tinnitus, trouble swallowing and voice change.   Eyes: Negative for pain, discharge and visual disturbance.  Respiratory: Negative for cough, chest tightness, wheezing and stridor.   Cardiovascular: Negative for chest pain, palpitations and leg swelling.  Gastrointestinal: Negative for nausea, vomiting, abdominal pain, diarrhea, constipation, blood in stool and abdominal distention.  Genitourinary: Negative for urgency, hematuria, flank pain, discharge, difficulty urinating and genital sores.  Musculoskeletal: Negative for arthralgias, back pain, gait problem, joint swelling, myalgias and neck stiffness.  Skin: Negative for rash.  Neurological: Negative for dizziness, syncope, speech difficulty, weakness, numbness and headaches.  Hematological: Negative for adenopathy. Does not bruise/bleed easily.  Psychiatric/Behavioral: Negative for behavioral problems and dysphoric mood. The patient is not nervous/anxious.        Objective:   Physical Exam  Constitutional: He is oriented to person, place, and time. He appears well-developed.  HENT:  Head: Normocephalic.    Right Ear: External ear normal.  Left Ear: External ear normal.  Eyes: Conjunctivae and EOM are normal.  Neck: Normal range of motion.  Cardiovascular: Normal rate and normal heart sounds.   Pulmonary/Chest: Breath sounds normal.  Abdominal: Bowel sounds are normal.  Musculoskeletal: Normal range of motion. He exhibits no edema and no tenderness.  Neurological: He is alert and oriented to person, place, and time.  Psychiatric: He has a normal mood and affect. His behavior is normal.  Assessment & Plan:   Coronary artery disease, stable Mild obesity.  Weight loss encouraged   Glucose Intolerance.  Weight loss encouraged.  Dietary information dispensed.  Will recheck hemoglobin A1c in 6 months at the time of his annual exam

## 2013-06-25 NOTE — Progress Notes (Signed)
Pre-visit discussion using our clinic review tool. No additional management support is needed unless otherwise documented below in the visit note.  

## 2013-06-25 NOTE — Patient Instructions (Addendum)
Limit your sodium (Salt) intake    It is important that you exercise regularly, at least 20 minutes 3 to 4 times per week.  If you develop chest pain or shortness of breath seek  medical attention.  You need to lose weight.  Consider a lower calorie diet and regular exercise.Diabetes and Exercise Exercising regularly is important. It is not just about losing weight. It has many health benefits, such as:  Improving your overall fitness, flexibility, and endurance.  Increasing your bone density.  Helping with weight control.  Decreasing your body fat.  Increasing your muscle strength.  Reducing stress and tension.  Improving your overall health. People with diabetes who exercise gain additional benefits because exercise:  Reduces appetite.  Improves the body's use of blood sugar (glucose).  Helps lower or control blood glucose.  Decreases blood pressure.  Helps control blood lipids (such as cholesterol and triglycerides).  Improves the body's use of the hormone insulin by:  Increasing the body's insulin sensitivity.  Reducing the body's insulin needs.  Decreases the risk for heart disease because exercising:  Lowers cholesterol and triglycerides levels.  Increases the levels of good cholesterol (such as high-density lipoproteins [HDL]) in the body.  Lowers blood glucose levels. YOUR ACTIVITY PLAN  Choose an activity that you enjoy and set realistic goals. Your health care provider or diabetes educator can help you make an activity plan that works for you. You can break activities into 2 or 3 sessions throughout the day. Doing so is as good as one long session. Exercise ideas include:  Taking the dog for a walk.  Taking the stairs instead of the elevator.  Dancing to your favorite song.  Doing your favorite exercise with a friend. RECOMMENDATIONS FOR EXERCISING WITH TYPE 1 OR TYPE 2 DIABETES   Check your blood glucose before exercising. If blood glucose levels  are greater than 240 mg/dL, check for urine ketones. Do not exercise if ketones are present.  Avoid injecting insulin into areas of the body that are going to be exercised. For example, avoid injecting insulin into:  The arms when playing tennis.  The legs when jogging.  Keep a record of:  Food intake before and after you exercise.  Expected peak times of insulin action.  Blood glucose levels before and after you exercise.  The type and amount of exercise you have done.  Review your records with your health care provider. Your health care provider will help you to develop guidelines for adjusting food intake and insulin amounts before and after exercising.  If you take insulin or oral hypoglycemic agents, watch for signs and symptoms of hypoglycemia. They include:  Dizziness.  Shaking.  Sweating.  Chills.  Confusion.  Drink plenty of water while you exercise to prevent dehydration or heat stroke. Body water is lost during exercise and must be replaced.  Talk to your health care provider before starting an exercise program to make sure it is safe for you. Remember, almost any type of activity is better than none. Document Released: 05/12/2003 Document Revised: 10/22/2012 Document Reviewed: 07/29/2012 [facility] Patient Information 2014 [place]. Diabetes Meal Planning Guide The diabetes meal planning guide is a tool to help you plan your meals and snacks. It is important for people with diabetes to manage their blood glucose (sugar) levels. Choosing the right foods and the right amounts throughout your day will help control your blood glucose. Eating right can even help you improve your blood pressure and reach or maintain a  healthy weight. CARBOHYDRATE COUNTING MADE EASY When you eat carbohydrates, they turn to sugar. This raises your blood glucose level. Counting carbohydrates can help you control this level so you feel better. When you plan your meals by counting  carbohydrates, you can have more flexibility in what you eat and balance your medicine with your food intake. Carbohydrate counting simply means adding up the total amount of carbohydrate grams in your meals and snacks. Try to eat about the same amount at each meal. Foods with carbohydrates are listed below. Each portion below is 1 carbohydrate serving or 15 grams of carbohydrates. Ask your dietician how many grams of carbohydrates you should eat at each meal or snack. Grains and Starches  1 slice bread.   English muffin or hotdog/hamburger bun.   cup cold cereal (unsweetened).   cup cooked pasta or rice.   cup starchy vegetables (corn, potatoes, peas, beans, winter squash).  1 tortilla (6 inches).   bagel.  1 waffle or pancake (size of a CD).   cup cooked cereal.  4 to 6 small crackers. *Whole grain is recommended. Fruit  1 cup fresh unsweetened berries, melon, papaya, pineapple.  1 small fresh fruit.   banana or mango.   cup fruit juice (4 oz unsweetened).   cup canned fruit in natural juice or water.  2 tbs dried fruit.  12 to 15 grapes or cherries. Milk and Yogurt  1 cup fat-free or 1% milk.  1 cup soy milk.  6 oz light yogurt with sugar-free sweetener.  6 oz low-fat soy yogurt.  6 oz plain yogurt. Vegetables  1 cup raw or  cup cooked is counted as 0 carbohydrates or a "free" food.  If you eat 3 or more servings at 1 meal, count them as 1 carbohydrate serving. Other Carbohydrates   oz chips or pretzels.   cup ice cream or frozen yogurt.   cup sherbet or sorbet.  2 inch square cake, no frosting.  1 tbs honey, sugar, jam, jelly, or syrup.  2 small cookies.  3 squares of graham crackers.  3 cups popcorn.  6 crackers.  1 cup broth-based soup.  Count 1 cup casserole or other mixed foods as 2 carbohydrate servings.  Foods with less than 20 calories in a serving may be counted as 0 carbohydrates or a "free" food. You may want to  purchase a book or computer software that lists the carbohydrate gram counts of different foods. In addition, the nutrition facts panel on the labels of the foods you eat are a good source of this information. The label will tell you how big the serving size is and the total number of carbohydrate grams you will be eating per serving. Divide this number by 15 to obtain the number of carbohydrate servings in a portion. Remember, 1 carbohydrate serving equals 15 grams of carbohydrate. SERVING SIZES Measuring foods and serving sizes helps you make sure you are getting the right amount of food. The list below tells how big or small some common serving sizes are.  1 oz.........4 stacked dice.  3 oz........Marland KitchenDeck of cards.  1 tsp.......Marland KitchenTip of little finger.  1 tbs......Marland KitchenMarland KitchenThumb.  2 tbs.......Marland KitchenGolf ball.   cup......Marland KitchenHalf of a fist.  1 cup.......Marland KitchenA fist. SAMPLE DIABETES MEAL PLAN Below is a sample meal plan that includes foods from the grain and starches, dairy, vegetable, fruit, and meat groups. A dietician can individualize a meal plan to fit your calorie needs and tell you the number of servings needed from  each food group. However, controlling the total amount of carbohydrates in your meal or snack is more important than making sure you include all of the food groups at every meal. You may interchange carbohydrate containing foods (dairy, starches, and fruits). The meal plan below is an example of a 2000 calorie diet using carbohydrate counting. This meal plan has 17 carbohydrate servings. Breakfast  1 cup oatmeal (2 carb servings).   cup light yogurt (1 carb serving).  1 cup blueberries (1 carb serving).   cup almonds. Snack  1 large apple (2 carb servings).  1 low-fat string cheese stick. Lunch  Chicken breast salad.  1 cup spinach.   cup chopped tomatoes.  2 oz chicken breast, sliced.  2 tbs low-fat [place] dressing.  12 whole-wheat crackers (2 carb servings).  12 to  15 grapes (1 carb serving).  1 cup low-fat milk (1 carb serving). Snack  1 cup carrots.   cup hummus (1 carb serving). Dinner  3 oz broiled salmon.  1 cup brown rice (3 carb servings). Snack  1  cups steamed broccoli (1 carb serving) drizzled with 1 tsp olive oil and lemon juice.  1 cup light pudding (2 carb servings). DIABETES MEAL PLANNING WORKSHEET Your dietician can use this worksheet to help you decide how many servings of foods and what types of foods are right for you.  BREAKFAST Food Group and Servings / Carb Servings Grain/Starches __________________________________ Dairy __________________________________________ Vegetable ______________________________________ Fruit ___________________________________________ Meat __________________________________________ Fat ____________________________________________ LUNCH Food Group and Servings / Carb Servings Grain/Starches ___________________________________ Dairy ___________________________________________ Fruit ____________________________________________ Meat ___________________________________________ Fat _____________________________________________ W[employer] Food Group and Servings / Carb Servings Grain/Starches ___________________________________ Dairy ___________________________________________ Fruit ____________________________________________ Meat ___________________________________________ Fat _____________________________________________ SNACKS Food Group and Servings / Carb Servings Grain/Starches ___________________________________ Dairy ___________________________________________ Vegetable _______________________________________ Fruit ____________________________________________ Meat ___________________________________________ Fat _____________________________________________ DAILY TOTALS Starches _________________________ Vegetable ________________________ Fruit ____________________________ Dairy  ____________________________ Meat ____________________________ Fat ______________________________ Document Released: 11/16/2004 Document Revised: 05/14/2011 Document Reviewed: 09/27/2008 [employer] Patient Information 2014 [employer]. Diabetes, Eating Away From Home Sometimes, you might eat in a restaurant or have meals that are prepared by someone else. You can enjoy eating out. However, the portions in restaurants may be much larger than needed. Listed below are some ideas to help you choose foods that will keep your blood glucose (sugar) in better control.  TIPS FOR EATING OUT  Know your meal plan and how many carbohydrate servings you should have at each meal. You may wish to carry a copy of your meal plan in your purse or wallet. Learn the foods included in each food group.  Make a list of restaurants near you that offer healthy choices. Take a copy of the carry-out menus to see what they offer. Then, you can plan what you will order ahead of time.  Become familiar with serving sizes by practicing them at home using measuring cups and spoons. Once you learn to recognize portion sizes, you will be able to correctly estimate the amount of total carbohydrate you are allowed to eat at the restaurant. Ask for a takeout box if the portion is more than you should have. When your food comes, leave the amount you should have on the plate, and put the rest in the takeout box before you start eating.  Plan ahead if your mealtime will be different from usual. Check with your caregiver to find out how to time meals and medicine if you are taking insulin.  Avoid high-fat foods, such as fried foods, cream sauces,  high-fat salad dressings, or any added butter or margarine.  Do not be afraid to ask questions. Ask your server about the portion size, cooking methods, ingredients and if items can be substituted. Restaurants do not list all available items on the menu. You can ask for your main entree to be  prepared using skim milk, oil instead of butter or margarine, and without gravy or sauces. Ask your waiter or waitress to serve salad dressings, gravy, sauces, margarine, and sour cream on the side. You can then add the amount your meal plan suggests.  Add more vegetables whenever possible.  Avoid items that are labeled "jumbo," "giant," "deluxe," or "supersized."  You may want to split an entre with someone and order an extra side salad.  Watch for hidden calories in foods like croutons, bacon, or cheese.  Ask your server to take away the bread basket or chips from your table.  Order a dinner salad as an appetizer. You can eat most foods served in a restaurant. Some foods are better choices than others. Breads and Starches  Recommended: All kinds of bread (wheat, rye, white, oatmeal, [place], [place], raisin), hard or soft dinner rolls, frankfurter or hamburger buns, small bagels, small corn or whole-wheat flour tortillas.  Avoid: Frosted or glazed breads, butter rolls, egg or cheese breads, croissants, sweet rolls, pastries, coffee cake, glazed or frosted doughnuts, muffins. Crackers  Recommended: Animal crackers, graham, rye, saltine, oyster, and matzoth crackers. Bread sticks, melba toast, rusks, pretzels, popcorn (without fat), zwieback toast.  Avoid: High-fat snack crackers or chips. Buttered popcorn. Cereals  Recommended: Hot and cold cereals. Whole grains such as oatmeal or shredded wheat are good choices.  Avoid: Sugar-coated or granola type cereals. Potatoes/Pasta/Rice/Beans  Recommended: Order baked, boiled, or mashed potatoes, rice or noodles without added fat, whole beans. Order gravies, butter, margarine, or sauces on the side so you can control the amount you add.  Avoid: Hash browns or fried potatoes. Potatoes, pasta, or rice prepared with cream or cheese sauce. Potato or pasta salads prepared with large amounts of dressing. Fried beans or fried  rice. Vegetables  Recommended: Order steamed, baked, boiled, or stewed vegetables without sauces or extra fat. Ask that sauce be served on the side. If vegetables are not listed on the menu, ask what is available.  Avoid: Vegetables prepared with cream, butter, or cheese sauce. Fried vegetables. Salad Bars  Recommended: Many of the vegetables at a salad bar are considered "free." Use lemon juice, vinegar, or low-calorie salad dressing (fewer than 20 calories per serving) as "free" dressings for your salad. Look for salad bar ingredients that have no added fat or sugar such as tomatoes, lettuce, cucumbers, broccoli, carrots, onions, and mushrooms.  Avoid: Prepared salads with large amounts of dressing, such as coleslaw, caesar salad, macaroni salad, bean salad, or carrot salad. Fruit  Recommended: Eat fresh fruit or fresh fruit salad without added dressing. A salad bar often offers fresh fruit choices, but canned fruit at a restaurant is usually packed in sugar or syrup.  Avoid: Sweetened canned or frozen fruits, plain or sweetened fruit juice. Fruit salads with dressing, sour cream, or sugar added to them. Meat and Meat Substitutes  Recommended: Order broiled, baked, roasted, or grilled meat, poultry, or fish. Trim off all visible fat. Do not eat the skin of poultry. The size stated on the menu is the raw weight. Meat shrinks by  in cooking (for example, 4 oz raw equals 3 oz cooked meat).  Avoid: Deep-fat fried  meat, poultry, or fish. Breaded meats. Eggs  Recommended: Order soft, hard-cooked, poached, or scrambled eggs. Omelets may be okay, depending on what ingredients are added. Egg substitutes are also a good choice.  Avoid: Fried eggs, eggs prepared with cream or cheese sauce. Milk  Recommended: Order low-fat or fat-free milk according to your meal plan. Plain, nonfat yogurt or flavored yogurt with no sugar added may be used as a substitute for milk. Soy milk may also be  used.  Avoid: Milk shakes or sweetened milk beverages. Soups and Combination Foods  Recommended: Clear broth or consomm are "free" foods and may be used as an appetizer. Broth-based soups with fat removed count as a starch serving and are preferred over cream soups. Soups made with beans or split peas may be eaten but count as a starch.  Avoid: Fatty soups, soup made with cream, cheese soup. Combination foods prepared with excessive amounts of fat or with cream or cheese sauces. Desserts and Sweets  Recommended: Ask for fresh fruit. Sponge or angel food cake without icing, ice milk, no sugar added ice cream, sherbet, or frozen yogurt may fit into your meal plan occasionally.  Avoid: Pastries, puddings, pies, cakes with icing, custard, gelatin desserts. Fats and Oils  Recommended: Choose healthy fats such as olive oil, canola oil, or tub margarine, reduced fat or fat-free sour cream, cream cheese, avocado, or nuts.  Avoid: Any fats in excess of your allowed portion. Deep-fried foods or any food with a large amount of fat. Note: Ask for all fats to be served on the side, and limit your portion sizes according to your meal plan. Document Released: 02/19/2005 Document Revised: 05/14/2011 Document Reviewed: 09/09/2008 [facility] Patient Information 2014 [place], [place]. Diets for Diabetes, Food Labeling Look at food labels to help you decide how much of a product you can eat. You will want to check the amount of total carbohydrate in a serving to see how the food fits into your meal plan. In the list of ingredients, the ingredient present in the largest amount by weight must be listed first, followed by the other ingredients in descending order. STANDARD OF IDENTITY Most products have a list of ingredients. However, foods that the Food and Drug Administration (FDA) has given a standard of identity do not need a list of ingredients. A standard of identity means that a food must contain certain  ingredients if it is called a particular name. Examples are mayonnaise, peanut butter, ketchup, jelly, and cheese. LABELING TERMS There are many terms found on food labels. Some of these terms have specific definitions. Some terms are regulated by the FDA, and the FDA has clearly specified how they can be used. Others are not regulated or well-defined and can be misleading and confusing. SPECIFICALLY DEFINED TERMS Nutritive Sweetener.  A sweetener that contains calories,such as table sugar or honey. Nonnutritive Sweetener.  A sweetener with few or no calories,such as saccharin, aspartame, sucralose, and cyclamate. LABELING TERMS REGULATED BY THE FDA Free.  The product contains only a tiny or small amount of fat, cholesterol, sodium, sugar, or calories. For example, a "fat-free" product will contain less than 0.5 g of fat per serving. Low.  A food described as "low" in fat, saturated fat, cholesterol, sodium, or calories could be eaten fairly often without exceeding dietary guidelines. For example, "low in fat" means no more than 3 g of fat per serving. Lean.  "Lean" and "extra lean" are U.S. Department of Agriculture Architect) terms for use on meat  and poultry products. "Lean" means the product contains less than 10 g of fat, 4 g of saturated fat, and 95 mg of cholesterol per serving. "Lean" is not as low in fat as a product labeled "low." Extra Lean.  "Extra lean" means the product contains less than 5 g of fat, 2 g of saturated fat, and 95 mg of cholesterol per serving. While "extra lean" has less fat than "lean," it is still higher in fat than a product labeled "low." Reduced, Less, Fewer.  A diet product that contains 25% less of a nutrient or calories than the regular version. For example, hot dogs might be labeled "25% less fat than our regular hot dogs." Light/Lite.  A diet product that contains  fewer calories or  the fat of the original. For example, "light in sodium" means a  product with  the usual sodium. More.  One serving contains at least 10% more of the daily value of a vitamin, mineral, or fiber than usual. Good Source Of.  One serving contains 10% to 19% of the daily value for a particular vitamin, mineral, or fiber. Excellent Source Of.  One serving contains 20% or more of the daily value for a particular nutrient. Other terms used might be "high in" or "rich in." Enriched or Fortified.  The product contains added vitamins, minerals, or protein. Nutrition labeling must be used on enriched or fortified foods. Imitation.  The product has been altered so that it is lower in protein, vitamins, or minerals than the usual food,such as imitation peanut butter. Total Fat.  The number listed is the total of all fat found in a serving of the product. Under total fat, food labels must list saturated fat and trans fat, which are associated with raising bad cholesterol and an increased risk of heart blood vessel disease. Saturated Fat.  Mainly fats from animal-based sources. Some examples are red meat, cheese, cream, whole milk, and coconut oil. Trans Fat.  Found in some fried snack foods, packaged foods, and fried restaurant foods. It is recommended you eat as close to 0 g of trans fat as possible, since it raises bad cholesterol and lowers good cholesterol. Polyunsaturated and Monounsaturated Fats.  More healthful fats. These fats are from plant sources. Total Carbohydrate.  The number of carbohydrate grams in a serving of the product. Under total carbohydrate are listed the other carbohydrate sources, such as dietary fiber and sugars. Dietary Fiber.  A carbohydrate from plant sources. Sugars.  Sugars listed on the label contain all naturally occurring sugars as well as added sugars. LABELING TERMS NOT REGULATED BY THE FDA Sugarless.  Table sugar (sucrose) has not been added. However, the manufacturer may use another form of sugar in place of  sucrose to sweeten the product. For example, sugar alcohols are used to sweeten foods. Sugar alcohols are a form of sugar but are not table sugar. If a product contains sugar alcohols in place of sucrose, it can still be labeled "sugarless." Low Salt, Salt-Free, Unsalted, No Salt, No Salt Added, Without Added Salt.  Food that is usually processed with salt has been made without salt. However, the food may contain sodium-containing additives, such as preservatives, leavening agents, or flavorings. Natural.  This term has no legal meaning. Organic.  Foods that are certified as organic have been inspected and approved by the USDA to ensure they are produced without pesticides, fertilizers containing synthetic ingredients, bioengineering, or ionizing radiation. Document Released: 02/22/2003 Document Revised: 05/14/2011 Document Reviewed: 09/09/2008 [employer]  Patient Information 2014 [employer].  

## 2013-06-26 ENCOUNTER — Telehealth: Payer: Self-pay | Admitting: Internal Medicine

## 2013-06-26 NOTE — Telephone Encounter (Signed)
Relevant patient education assigned to patient using Emmi. ° °

## 2013-07-17 ENCOUNTER — Other Ambulatory Visit: Payer: Self-pay | Admitting: Internal Medicine

## 2013-07-27 ENCOUNTER — Encounter: Payer: Self-pay | Admitting: Internal Medicine

## 2013-07-29 MED ORDER — TADALAFIL 20 MG PO TABS: 20.0000 mg | ORAL_TABLET | Freq: Every day | ORAL | Status: DC | PRN

## 2013-07-29 NOTE — Telephone Encounter (Signed)
Pt notified Rx refill request sent to pharmacy.

## 2013-07-30 ENCOUNTER — Telehealth: Payer: Self-pay | Admitting: Internal Medicine

## 2013-07-30 NOTE — Telephone Encounter (Signed)
Called pharmacy Rx is there but needs Prior Authorization and was faxed over. Told pharmacist I will let pt know.  Called pt told him needs PA and will not get done today and will probably be denied. Told pt can purchase one tablet or as many as he wants and pay out of pocket. Pt verbalized understanding.

## 2013-07-30 NOTE — Telephone Encounter (Signed)
Pt states he is leaving to go out of town later today and [facility] [place] is stating they have not received rx for tadalafil (CIALIS) 20 MG tablet.  Pt was informed rx was sent to pharmacy on yesterday.  Please contact pharmacy to patient can get med refill today before leaving.

## 2013-07-30 NOTE — Telephone Encounter (Signed)
Pt's insurance requires a Letter of Medical Necessity sent for Cialis 20 mg.  I was told by the customer service representative that this is all they require as paperwork sent.  If you complete the letter, I can fax it to the appropriate department.

## 2013-08-03 ENCOUNTER — Encounter: Payer: Self-pay | Admitting: Internal Medicine

## 2013-08-04 NOTE — Telephone Encounter (Signed)
I faxed the letter of medical necessity to [phone] on 08/03/13

## 2013-08-06 ENCOUNTER — Other Ambulatory Visit: Payer: Self-pay | Admitting: Internal Medicine

## 2013-08-17 ENCOUNTER — Other Ambulatory Visit (HOSPITAL_COMMUNITY): Payer: Self-pay | Admitting: *Deleted

## 2013-08-17 ENCOUNTER — Other Ambulatory Visit (HOSPITAL_COMMUNITY): Payer: Self-pay | Admitting: Internal Medicine

## 2013-08-17 MED ORDER — NITROGLYCERIN 0.4 MG SL SUBL: 0.4000 mg | SUBLINGUAL_TABLET | SUBLINGUAL | Status: DC | PRN

## 2013-08-18 ENCOUNTER — Other Ambulatory Visit (HOSPITAL_COMMUNITY): Payer: Self-pay | Admitting: Internal Medicine

## 2013-08-27 ENCOUNTER — Other Ambulatory Visit: Payer: Self-pay | Admitting: Internal Medicine

## 2013-08-27 ENCOUNTER — Encounter: Payer: Self-pay | Admitting: Internal Medicine

## 2013-08-27 MED ORDER — TADALAFIL 20 MG PO TABS: 20.0000 mg | ORAL_TABLET | Freq: Every day | ORAL | Status: DC | PRN

## 2013-09-05 ENCOUNTER — Encounter (HOSPITAL_COMMUNITY): Payer: Self-pay | Admitting: Emergency Medicine

## 2013-09-05 ENCOUNTER — Emergency Department (HOSPITAL_COMMUNITY)
Admission: EM | Admit: 2013-09-05 | Discharge: 2013-09-05 | Disposition: A | Discharge status: Home / Self Care | Payer: 59 | Attending: Emergency Medicine | Admitting: Emergency Medicine

## 2013-09-05 ENCOUNTER — Emergency Department (HOSPITAL_COMMUNITY): Payer: 59

## 2013-09-05 DIAGNOSIS — Z79899 Other long term (current) drug therapy: Secondary | ICD-10-CM | POA: Insufficient documentation

## 2013-09-05 DIAGNOSIS — I2510 Atherosclerotic heart disease of native coronary artery without angina pectoris: Secondary | ICD-10-CM | POA: Insufficient documentation

## 2013-09-05 DIAGNOSIS — Z862 Personal history of diseases of the blood and blood-forming organs and certain disorders involving the immune mechanism: Secondary | ICD-10-CM | POA: Insufficient documentation

## 2013-09-05 DIAGNOSIS — K219 Gastro-esophageal reflux disease without esophagitis: Secondary | ICD-10-CM | POA: Insufficient documentation

## 2013-09-05 DIAGNOSIS — Z7982 Long term (current) use of aspirin: Secondary | ICD-10-CM | POA: Insufficient documentation

## 2013-09-05 DIAGNOSIS — I252 Old myocardial infarction: Secondary | ICD-10-CM | POA: Insufficient documentation

## 2013-09-05 DIAGNOSIS — Z860100 Personal history of colon polyps, unspecified: Secondary | ICD-10-CM | POA: Insufficient documentation

## 2013-09-05 DIAGNOSIS — Z872 Personal history of diseases of the skin and subcutaneous tissue: Secondary | ICD-10-CM | POA: Insufficient documentation

## 2013-09-05 DIAGNOSIS — F411 Generalized anxiety disorder: Secondary | ICD-10-CM | POA: Insufficient documentation

## 2013-09-05 DIAGNOSIS — Z9861 Coronary angioplasty status: Secondary | ICD-10-CM | POA: Insufficient documentation

## 2013-09-05 DIAGNOSIS — I10 Essential (primary) hypertension: Secondary | ICD-10-CM | POA: Insufficient documentation

## 2013-09-05 DIAGNOSIS — R079 Chest pain, unspecified: Secondary | ICD-10-CM | POA: Insufficient documentation

## 2013-09-05 DIAGNOSIS — Z87440 Personal history of urinary (tract) infections: Secondary | ICD-10-CM | POA: Insufficient documentation

## 2013-09-05 DIAGNOSIS — Z8673 Personal history of transient ischemic attack (TIA), and cerebral infarction without residual deficits: Secondary | ICD-10-CM | POA: Insufficient documentation

## 2013-09-05 DIAGNOSIS — Z791 Long term (current) use of non-steroidal anti-inflammatories (NSAID): Secondary | ICD-10-CM | POA: Insufficient documentation

## 2013-09-05 DIAGNOSIS — Z7902 Long term (current) use of antithrombotics/antiplatelets: Secondary | ICD-10-CM | POA: Insufficient documentation

## 2013-09-05 LAB — BASIC METABOLIC PANEL
Anion gap: 17 — ABNORMAL HIGH (ref 5–15)
BUN: 16 mg/dL (ref 6–23)
CHLORIDE: 105 meq/L (ref 96–112)
CO2: 22 meq/L (ref 19–32)
Calcium: 9.1 mg/dL (ref 8.4–10.5)
Creatinine, Ser: 0.99 mg/dL (ref 0.50–1.35)
GFR calc Af Amer: >90 mL/min (ref >90)
GFR calc non Af Amer: 87 mL/min — ABNORMAL LOW (ref >90)
GLUCOSE: 127 mg/dL — AB (ref 70–99)
POTASSIUM: 4 meq/L (ref 3.7–5.3)
SODIUM: 144 meq/L (ref 137–147)

## 2013-09-05 LAB — CBC
HCT: 44.1 % (ref 39.0–52.0)
HEMOGLOBIN: 15.1 g/dL (ref 13.0–17.0)
MCH: 31.3 pg (ref 26.0–34.0)
MCHC: 34.2 g/dL (ref 30.0–36.0)
MCV: 91.3 fL (ref 78.0–100.0)
Platelets: 172 10*3/uL (ref 150–400)
RBC: 4.83 MIL/uL (ref 4.22–5.81)
RDW: 13.3 % (ref 11.5–15.5)
WBC: 6.3 10*3/uL (ref 4.0–10.5)

## 2013-09-05 LAB — I-STAT TROPONIN, ED
TROPONIN I, POC: 0 ng/mL (ref 0.00–0.08)
Troponin i, poc: 0 ng/mL (ref 0.00–0.08)

## 2013-09-05 MED ORDER — ASPIRIN 325 MG PO TABS
325.0000 mg | ORAL_TABLET | ORAL | Status: AC
  Administered 2013-09-05: 325 mg via ORAL
  Filled 2013-09-05: qty 1

## 2013-09-05 NOTE — ED Notes (Signed)
Pt from home, c/o cp with numbness to right arm starting last night.

## 2013-09-05 NOTE — Discharge Instructions (Signed)

## 2013-09-05 NOTE — ED Provider Notes (Signed)
CSN: [id number]     Arrival date & time 09/05/13  6269 History   First MD Initiated Contact with Patient 09/05/13 [phone]     Chief Complaint  Patient presents with  . Chest Pain     (Consider location/radiation/quality/duration/timing/severity/associated sxs/prior Treatment) HPI 63 year old male with history of coronary artery disease, MI with stent in 2010, hypertension, hyperlipidemia, no recent anginal type symptoms until overnight last night starting yesterday evening developed a constant mild chest pressure radiating to his left shoulder and left arm with some shortness of breath nausea and left arm numbness his chest pain shortness of breath and nausea are all completely resolved he is some minimal residual numbness to his left hand but the rest of his left arm is not known he has no weakness or incoordination to his left arm he had no weakness or numbness to his left face his left leg he is no fever no cough and no sudden or severe or sharp stabbing chest pain at all. He has no back pain no abdominal pain. There is no treatment prior to arrival. He had his symptoms for about 12 hours or so. His symptoms were not sudden sharp stabbing severe pleuritic positional or exertional. Past Medical History  Diagnosis Date  . History of TIA (transient ischemic attack) may 2010  . HYPERTENSION 09/18/2006  . GERD 09/18/2006  . 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  . ANXIETY 07/27/2008  . ALCOHOL ABUSE 07/27/2008  . LOW BACK PAIN 09/18/2006  . Rosacea 07/27/2008  . Hx of adenomatous colonic polyps   . Bifascicular block     beta blocker stopped due to profound bradycardia  . CAD 07/27/2008    a. s/p Endeavor DES to CFX 07/2008; residual OM1 70%, EF 45%;  b. relook cath 5/10: patent stent in CFX  . Urinary tract infection start cipro 01-07-2012  . SEIZURE DISORDER 09/18/2006    none since 2008  . Numbness and tingling     finger tips at times  . Myocardial infarction 2010   Past Surgical History  Procedure  Laterality Date  . Knee surgery    . Coronary stent placement      drug eluting  . Inguinal hernia repair  01/14/2012    Procedure: LAPAROSCOPIC INGUINAL HERNIA;  Surgeon: Ralene Ok, MD;  Location: [facility];  Service: General;  Laterality: Right;  . Insertion of mesh  01/14/2012    Procedure: INSERTION OF MESH;  Surgeon: Ralene Ok, MD;  Location: [facility];  Service: General;  Laterality: Right;  . Hernia repair     Family History  Problem Relation Age of Onset  . Lung cancer Mother   . Hypertension Mother   . Emphysema Father   . Heart attack Father   . Colon cancer Neg Hx    History  Substance Use Topics  . Smoking status: Never Smoker   . Smokeless tobacco: Former Systems developer    Types: [place] date: 02/03/2012  . Alcohol Use: No     Comment: past drinker 2-3 daily and sometimes binge drinks on the weekend    Review of Systems 10 Systems reviewed and are negative for acute change except as noted in the HPI.   Allergies  Review of patient's allergies indicates no known allergies.  Home Medications   Prior to Admission medications   Medication Sig Start Date End Date Taking? Authorizing Provider  ALPRAZolam Duanne Moron) 0.5 MG tablet Take 0.5 mg by mouth 3 (three) times daily as needed for anxiety.  Yes Historical Provider, MD  aspirin 81 MG tablet Take 81 mg by mouth every morning.    Yes Historical Provider, MD  Azelaic Acid (FINACEA) 15 % cream Apply 1 application topically as directed. After skin is thoroughly washed and patted dry, gently but thoroughly massage a thin film of azelaic acid cream into the affected area twice daily, in the morning and evening. Applied to face.   Yes Historical Provider, MD  benazepril (LOTENSIN) 40 MG tablet Take 40 mg by mouth daily.   Yes Historical Provider, MD  clopidogrel (PLAVIX) 75 MG tablet Take 75 mg by mouth daily.   Yes Historical Provider, MD  diclofenac sodium (VOLTAREN) 1 % GEL Apply 2 g topically 2 (two) times daily as  needed. Apply to painful areas around knees   Yes Historical Provider, MD  halobetasol (ULTRAVATE) 0.05 % cream Apply 1 application topically as directed.    Yes Historical Provider, MD  Multiple Vitamin (MULTIVITAMIN WITH MINERALS) TABS Take 1 tablet by mouth daily.   Yes Historical Provider, MD  nitroGLYCERIN (NITROSTAT) 0.4 MG SL tablet Place 0.4 mg under the tongue every 5 (five) minutes as needed for chest pain.   Yes Historical Provider, MD  pantoprazole (PROTONIX) 40 MG tablet Take 40 mg by mouth daily.   Yes Historical Provider, MD  pravastatin (PRAVACHOL) 20 MG tablet Take 20 mg by mouth daily.   Yes Historical Provider, MD  tadalafil (CIALIS) 20 MG tablet Take 20 mg by mouth daily as needed for erectile dysfunction.   Yes Historical Provider, MD   BP 149/82  Pulse 80  Temp(Src) 98.1 F (36.7 C)  Resp 19  Ht 5\' 8"  (1.727 m)  Wt 200 lb (90.719 kg)  BMI 30.42 kg/m2  SpO2 99% Physical Exam  Nursing note and vitals reviewed. Constitutional:  Awake, alert, nontoxic appearance.  HENT:  Head: Atraumatic.  Eyes: Right eye exhibits no discharge. Left eye exhibits no discharge.  Neck: Neck supple.  Cardiovascular: Normal rate and regular rhythm.   No murmur heard. Pulmonary/Chest: Effort normal and breath sounds normal. No respiratory distress. He has no wheezes. He has no rales. He exhibits no tenderness.  Abdominal: Soft. Bowel sounds are normal. He exhibits no distension and no mass. There is no tenderness. There is no rebound and no guarding.  Musculoskeletal: He exhibits no edema and no tenderness.  Baseline ROM, no obvious new focal weakness.  Neurological: He is alert.  Mental status and motor strength appears baseline for patient and situation.  Skin: No rash noted.  Psychiatric: He has a normal mood and affect.    ED Course  Procedures (including critical care time) D/w Cards recs OutPt f/u.Patient / Family / Caregiver informed of clinical course, understand medical  decision-making process, and agree with plan. Labs Review Labs Reviewed  BASIC METABOLIC PANEL - Abnormal; Notable for the following:    Glucose, Bld 127 (*)    GFR calc non Af Amer 87 (*)    Anion gap 17 (*)    All other components within normal limits  CBC  I-STAT TROPOININ, ED  Randolm Idol, ED    Imaging Review Dg Chest Port 1 View  09/05/2013   CLINICAL DATA:  cp sobr  EXAM: PORTABLE CHEST - 1 VIEW  COMPARISON:  Portal chest radiograph 01/07/2012.  FINDINGS: Low lung volumes. Cardiac silhouette is unremarkable. Linear is density within the lung bases left greater than right. No focal regions of consolidation or focal infiltrates. No acute osseous abnormalities.  IMPRESSION:  Minimal atelectasis versus scarring within the lung bases. No acute cardiopulmonary disease.   Electronically Signed   By: Margaree Mackintosh M.D.   On: 09/05/2013 10:53     EKG Interpretation   Date/Time:  Saturday September 05 2013 09:38:55 EDT Ventricular Rate:  96 PR Interval:  172 QRS Duration: 144 QT Interval:  384 QTC Calculation: 485 R Axis:   -74 Text Interpretation:  Normal sinus rhythm Left axis deviation Right bundle  branch block Inferior infarct , age undetermined Anterior infarct , age  undetermined Compared to previous tracing Ventricular-paced rhythm NO  LONGER PRESENT Confirmed by [facility]  MD, Jenny Reichmann ([id number]) on 09/05/2013 10:01:49  AM      MDM   Final diagnoses:  Chest pain, unspecified chest pain type    I doubt any other EMC precluding discharge at this time including, but not necessarily limited to the following:AMI.    Babette Relic, MD 09/05/13 1910

## 2013-09-15 ENCOUNTER — Encounter: Payer: Self-pay | Admitting: Physician Assistant

## 2013-09-15 ENCOUNTER — Ambulatory Visit (INDEPENDENT_AMBULATORY_CARE_PROVIDER_SITE_OTHER): Payer: 59 | Admitting: Physician Assistant

## 2013-09-15 VITALS — BP 120/75 | HR 53 | Ht 68.0 in | Wt 210.0 lb

## 2013-09-15 DIAGNOSIS — I10 Essential (primary) hypertension: Secondary | ICD-10-CM

## 2013-09-15 DIAGNOSIS — R079 Chest pain, unspecified: Secondary | ICD-10-CM

## 2013-09-15 DIAGNOSIS — I2510 Atherosclerotic heart disease of native coronary artery without angina pectoris: Secondary | ICD-10-CM

## 2013-09-15 DIAGNOSIS — E785 Hyperlipidemia, unspecified: Secondary | ICD-10-CM

## 2013-09-15 NOTE — Progress Notes (Signed)
Cardiology Office Note    Date:  09/15/2013   ID:  Jose [name], DOB 11-14-1950, MRN [id number]  PCP:  Nyoka Cowden, MD  Cardiologist:  Dr. Glori Bickers >>> Dr. [name]      History of Present Illness: Jose [name] is a 63 y.o. male with a hx of CAD, s/p Endeavor DES to the CFX in 07/2008. He had residual 70% stenosis in the OM1. Follow up cath a few weeks after PCI demonstrated patent stent and EF 45%. Other hx includes HTN, HL, GERD, bifascicular block with RBBB/LAFB.  He was seen in the ED 7/4 with chest pain.  He returns for follow up.    He developed left-sided chest pressure and left arm aching while at rest. This went on for several hours. He had associated nausea and dyspnea. Troponins in the emergency room were normal. Chest x-ray was unremarkable. He denies exertional chest discomfort. He has noted increasing exertional dyspnea over the last 6 months. He thinks it may be getting worse. He is NYHA 2-2b. He denies orthopnea, PND or edema. He denies syncope.   Studies:  - Echo (09/2005):  EF 55-60%, no RWMA   Recent Labs: 09/05/2013: Creatinine 0.99; Hemoglobin 15.1; Potassium 4.0   Wt Readings from Last 3 Encounters:  09/15/13 210 lb (95.255 kg)  09/05/13 200 lb (90.719 kg)  06/25/13 209 lb (94.802 kg)     Past Medical History  Diagnosis Date  . History of TIA (transient ischemic attack) may 2010  . HYPERTENSION 09/18/2006  . GERD 09/18/2006  . 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  . ANXIETY 07/27/2008  . ALCOHOL ABUSE 07/27/2008  . LOW BACK PAIN 09/18/2006  . Rosacea 07/27/2008  . Hx of adenomatous colonic polyps   . Bifascicular block     beta blocker stopped due to profound bradycardia  . CAD 07/27/2008    a. s/p Endeavor DES to CFX 07/2008; residual OM1 70%, EF 45%;  b. relook cath 5/10: patent stent in CFX  . Urinary tract infection start cipro 01-07-2012  . SEIZURE DISORDER 09/18/2006    none since 2008  . Numbness and tingling     finger tips at times   . Myocardial infarction 2010    Current Outpatient Prescriptions  Medication Sig Dispense Refill  . ALPRAZolam (XANAX) 0.5 MG tablet Take 0.5 mg by mouth 3 (three) times daily as needed for anxiety.      Marland Kitchen aspirin 81 MG tablet Take 81 mg by mouth every morning.       . Azelaic Acid (FINACEA) 15 % cream Apply 1 application topically as directed. After skin is thoroughly washed and patted dry, gently but thoroughly massage a thin film of azelaic acid cream into the affected area twice daily, in the morning and evening. Applied to face.      . benazepril (LOTENSIN) 40 MG tablet Take 40 mg by mouth daily.      . clopidogrel (PLAVIX) 75 MG tablet Take 75 mg by mouth daily.      . diclofenac sodium (VOLTAREN) 1 % GEL Apply 2 g topically 2 (two) times daily as needed. Apply to painful areas around knees      . halobetasol (ULTRAVATE) 0.05 % cream Apply 1 application topically as directed.       . Multiple Vitamin (MULTIVITAMIN WITH MINERALS) TABS Take 1 tablet by mouth daily.      . nitroGLYCERIN (NITROSTAT) 0.4 MG SL tablet Place 0.4 mg under the tongue every 5 (five) minutes as  needed for chest pain.      . pantoprazole (PROTONIX) 40 MG tablet Take 40 mg by mouth daily.      . pravastatin (PRAVACHOL) 20 MG tablet Take 20 mg by mouth daily.      . tadalafil (CIALIS) 20 MG tablet Take 20 mg by mouth daily as needed for erectile dysfunction.       No current facility-administered medications for this visit.    Allergies:   Review of patient's allergies indicates no known allergies.   Social History:  The patient  reports that he has never smoked. He quit smokeless tobacco use about 19 months ago. His smokeless tobacco use included Chew. He reports that he does not drink alcohol or use illicit drugs.   Family History:  The patient's family history includes Diabetes in his mother; Emphysema in his father; Heart attack in an other family member; Heart failure in his father; Hyperlipidemia in his  mother; Hypertension in his mother; Lung cancer in his mother. There is no history of Colon cancer.   ROS:  Please see the history of present illness.      All other systems reviewed and negative.   PHYSICAL EXAM: VS:  BP 120/75  Pulse 53  Ht 5\' 8"  (1.727 m)  Wt 210 lb (95.255 kg)  BMI 31.94 kg/m2 Well nourished, well developed, in no acute distress HEENT: normal Neck: no JVD Vascular: No carotid bruits Cardiac:  normal S1, S2; RRR; no murmur Lungs:  clear to auscultation bilaterally, no wheezing, rhonchi or rales Abd: soft, nontender, no hepatomegaly Ext: no edema Skin: warm and dry Neuro:  CNs 2-12 intact, no focal abnormalities noted  EKG:  Sinus bradycardia, HR 53, LAFB, RBBB, no change from prior tracing     ASSESSMENT AND PLAN:  1. Chest pain, unspecified:  Symptoms are somewhat concerning. He felt that his chest discomfort was similar to his prior angina. He had several hours of chest discomfort and negative cardiac enzymes. He denies any recurrent chest discomfort since. He has noted increasing dyspnea with exertion over the last several months.  Arrange ETT-Myoview. 2. CAD:  Arrange Myoview as noted. Continue aspirin, Plavix, statin. He is not on beta blocker given bradycardia. 3. Ischemic cardiomyopathy: EF was previously 45% at cardiac catheterization. Reassess LV function at [facility].  Continue ACE inhibitor. 4. HYPERTENSION:  Controlled. 5. HYPERLIPIDEMIA:  Continue statin. Managed by primary care. 6. Disposition: Followup with Dr.Cooper in 4-6 weeks.   Signed, Versie Starks, MHS 09/15/2013 11:12 AM    [facility] [address], [place], [place]  [address] Phone: [phone]; Fax: [phone]

## 2013-09-15 NOTE — Patient Instructions (Signed)
Your physician has requested that you have en exercise stress myoview. For further information please visit [email or web address]. Please follow instruction sheet, as given.   Your physician recommends that you continue on your current medications as directed. Please refer to the Current Medication list given to you today.  Your physician recommends that you schedule a follow-up appointment in: [place]

## 2013-09-23 ENCOUNTER — Ambulatory Visit (HOSPITAL_COMMUNITY): Payer: 59 | Attending: Cardiovascular Disease | Admitting: Radiology

## 2013-09-23 VITALS — BP 133/86 | HR 49 | Ht 68.0 in | Wt 204.0 lb

## 2013-09-23 DIAGNOSIS — Z8249 Family history of ischemic heart disease and other diseases of the circulatory system: Secondary | ICD-10-CM | POA: Insufficient documentation

## 2013-09-23 DIAGNOSIS — R079 Chest pain, unspecified: Secondary | ICD-10-CM | POA: Diagnosis present

## 2013-09-23 DIAGNOSIS — Z9861 Coronary angioplasty status: Secondary | ICD-10-CM | POA: Diagnosis not present

## 2013-09-23 DIAGNOSIS — I4510 Unspecified right bundle-branch block: Secondary | ICD-10-CM | POA: Insufficient documentation

## 2013-09-23 DIAGNOSIS — I10 Essential (primary) hypertension: Secondary | ICD-10-CM | POA: Insufficient documentation

## 2013-09-23 DIAGNOSIS — I2510 Atherosclerotic heart disease of native coronary artery without angina pectoris: Secondary | ICD-10-CM | POA: Diagnosis not present

## 2013-09-23 DIAGNOSIS — I252 Old myocardial infarction: Secondary | ICD-10-CM | POA: Diagnosis not present

## 2013-09-23 MED ORDER — TECHNETIUM TC 99M SESTAMIBI GENERIC - CARDIOLITE
11.0000 | Freq: Once | INTRAVENOUS | Status: AC | PRN
  Administered 2013-09-23: 11 via INTRAVENOUS

## 2013-09-23 MED ORDER — TECHNETIUM TC 99M SESTAMIBI GENERIC - CARDIOLITE
33.0000 | Freq: Once | INTRAVENOUS | Status: AC | PRN
  Administered 2013-09-23: 33 via INTRAVENOUS

## 2013-09-23 NOTE — Progress Notes (Signed)
[facility] [address] [place], [place] [address] [phone]    Cardiology Nuclear Med Study  Jose [name] is a 63 y.o. male     MRN : [id number]     DOB: May 20, 1950  Procedure Date: 09/23/2013  Nuclear Med Background Indication for Stress Test:  Evaluation for Ischemia, Stent Patency, Patient seen in hospital on 09-05-2013 for Chest Pain, Enzymes negative History:  CAD, MI, Cath, Stent (Cfx), Echo 2007 EF 55-60%, MPI 2004 (ok per pt), Hx seizures Cardiac Risk Factors: CVA, Family History - CAD, Hypertension, Lipids and RBBB  Symptoms:  Chest Pain (last date of chest discomfort was September 05, 2013) and DOE   Nuclear Pre-Procedure Caffeine/Decaff Intake:  None> 12 hrs NPO After: 6:30pm   Lungs:  clear O2 Sat: 96% on room air. IV 0.9% NS with Angio Cath:  22g  IV Site: R Hand x 1, tolerated well IV Started by:  Irven Baltimore, RN  Chest Size (in):  42 Cup Size: n/a  Height: 5\' 8"  (1.727 m)  Weight:  204 lb (92.534 kg)  BMI:  Body mass index is 31.03 kg/(m^2). Tech Comments:  Patient took Lotensin this am. Irven Baltimore, RN.    Nuclear Med Study 1 or 2 day study: 1 day  Stress Test Type:  Stress  Reading MD: N/A  Order Authorizing Provider:  [name], MD, and Richardson Dopp, [facility]  Resting Radionuclide: Technetium 76m Sestamibi  Resting Radionuclide Dose: 11.0 mCi   Stress Radionuclide:  Technetium 60m Sestamibi  Stress Radionuclide Dose: 33.0 mCi           Stress Protocol Rest HR: 49 Stress HR: 157  Rest BP: 133/86 Stress BP: 199/68  Exercise Time (min): 7:30 METS: 9.3           Dose of Adenosine (mg):  n/a Dose of Lexiscan: n/a mg  Dose of Atropine (mg): n/a Dose of Dobutamine: n/a mcg/kg/min (at max HR)  Stress Test Technologist: Glade Lloyd, BS-ES  Nuclear Technologist:  Vedia Pereyra, CNMT     Rest Procedure:  Myocardial perfusion imaging was performed at rest 45 minutes following the intravenous administration of Technetium  55m Sestamibi. Rest ECG: NSR-RBBB  Stress Procedure:  The patient exercised on the treadmill utilizing the Bruce Protocol for 7:30 minutes. The patient stopped due to fatigue and denied any chest pain.  Technetium 6m Sestamibi was injected at peak exercise and myocardial perfusion imaging was performed after a brief delay. Stress ECG: No significant change from baseline ECG  QPS Raw Data Images:  Diaphragmatic attenuation and increased gut uptake on rest images. Stress Images:  There is decreased uptake in the inferior wall. Rest Images:  There is decreased uptake in the inferior wall. Subtraction (SDS):  There is a fixed inferior defect with some reversibility that is most consistent with variations in  diaphragmatic attenuation as well as gut uptake seen only on rest images but cannot rule out a small area of ischemia in the inferior wall and inferoapex. Transient Ischemic Dilatation (Normal <1.22):  1.00 Lung/Heart Ratio (Normal <0.45):  0.41  Quantitative Gated Spect Images QGS EDV:  109 ml QGS ESV:  53 ml  Impression Exercise Capacity:  Good exercise capacity. BP Response:  Normal blood pressure response. Clinical Symptoms:  No symptoms. ECG Impression:  No significant ST segment change suggestive of ischemia. Comparison with Prior Nuclear Study: No images to compare  Overall Impression:  Moderate risk stress nuclear study with a partially fixed  defect in the inferior wall with some reversibility.  Cannot rule out inferior ischemia vs. variations in diaphragmatic attenuation as well as interfering gut uptake on rest images..  LV Ejection Fraction: 51%.  LV Wall Motion:  NL LV Function; NL Wall Motion   Signed: Fransico Him, MD [place]

## 2013-09-24 ENCOUNTER — Encounter: Payer: Self-pay | Admitting: Physician Assistant

## 2013-09-30 ENCOUNTER — Telehealth: Payer: Self-pay | Admitting: *Deleted

## 2013-09-30 NOTE — Telephone Encounter (Signed)
Message copied by Verna Czech on Wed Sep 30, 2013  1:56 PM ------      Message from: Rule, [place] T      Created: Mon Sep 28, 2013  1:31 PM       Reviewed with Dr. [name].      Study is abnormal - moderate risk.      Recommend earlier follow up to discuss proceeding with cardiac cath to further evaluate.      Patient can see Dr. [name] or me on a day Dr. [name] in the office in the next week. ------

## 2013-09-30 NOTE — Telephone Encounter (Signed)
I spoke with pt about coming in tomorrow to see Dr. Burt Knack to discuss the results of his myoview.Marland Kitchen He is able to do so. He is aware his myoview was abnormal & the possibility of a heart cath. Reassurance given. Appointment made  Horton Chin RN

## 2013-10-01 ENCOUNTER — Encounter: Payer: Self-pay | Admitting: *Deleted

## 2013-10-01 ENCOUNTER — Encounter: Payer: Self-pay | Admitting: Cardiovascular Disease

## 2013-10-01 ENCOUNTER — Encounter: Payer: Self-pay | Admitting: General Surgery

## 2013-10-01 ENCOUNTER — Ambulatory Visit (INDEPENDENT_AMBULATORY_CARE_PROVIDER_SITE_OTHER): Payer: 59 | Admitting: Cardiovascular Disease

## 2013-10-01 VITALS — BP 130/90 | HR 80 | Ht 68.0 in | Wt 209.3 lb

## 2013-10-01 DIAGNOSIS — I2510 Atherosclerotic heart disease of native coronary artery without angina pectoris: Secondary | ICD-10-CM

## 2013-10-01 DIAGNOSIS — I209 Angina pectoris, unspecified: Secondary | ICD-10-CM

## 2013-10-01 DIAGNOSIS — R9439 Abnormal result of other cardiovascular function study: Secondary | ICD-10-CM

## 2013-10-01 DIAGNOSIS — I25119 Atherosclerotic heart disease of native coronary artery with unspecified angina pectoris: Secondary | ICD-10-CM

## 2013-10-01 DIAGNOSIS — R931 Abnormal findings on diagnostic imaging of heart and coronary circulation: Secondary | ICD-10-CM

## 2013-10-01 NOTE — Progress Notes (Signed)
HPI:  63 year old gentleman presenting for followup of coronary artery disease and recent nuclear stress test. The patient underwent stenting of the left circumflex in 2010 with an endeavor drug-eluting stent. He was noted to have residual 70% stenosis in the first obtuse marginal branch. 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 The patient was evaluated in the emergency department on July 4 with chest pain. He ruled out for myocardial infarction. He was seen in the office on July 14 by Richardson Dopp. He described left-sided chest pressure and left arm aching at rest. The patient also had exertional dyspnea, progressive over about 6 months. A stress Myoview was performed and this demonstrated inferior scar with peri-infarct ischemia. It was interpreted as a "moderate risk study." He presents today for followup discussion.  He has occasional sharp pains in the center of his chest. These are transient in nature and unrelated to physical exertion. He rides a recumbent bike for exercise. He has become limited by shortness of breath which has been slowly progressive over the past 6 months. He has not had leg swelling, orthopnea, PND, cough, or wheezing. He has no resting shortness of breath. He has no other complaints today. He has tolerated long-term dual antiplatelet therapy with aspirin and Plavix without bleeding problems.  Outpatient Encounter Prescriptions as of 10/01/2013  Medication Sig  . ALPRAZolam (XANAX) 0.5 MG tablet Take 0.5 mg by mouth 3 (three) times daily as needed for anxiety.  Marland Kitchen aspirin 81 MG tablet Take 81 mg by mouth every morning.   . Azelaic Acid (FINACEA) 15 % cream Apply 1 application topically as directed. After skin is thoroughly washed and patted dry, gently but thoroughly massage a thin film of azelaic acid cream into the affected area twice daily, in the morning and evening. Applied to  face.  . benazepril (LOTENSIN) 40 MG tablet Take 40 mg by mouth daily.  . clopidogrel (PLAVIX) 75 MG tablet Take 75 mg by mouth daily.  . diclofenac sodium (VOLTAREN) 1 % GEL Apply 2 g topically 2 (two) times daily as needed. Apply to painful areas around knees  . halobetasol (ULTRAVATE) 0.05 % cream Apply 1 application topically as directed.   . Multiple Vitamin (MULTIVITAMIN WITH MINERALS) TABS Take 1 tablet by mouth daily.  . nitroGLYCERIN (NITROSTAT) 0.4 MG SL tablet Place 0.4 mg under the tongue every 5 (five) minutes as needed for chest pain.  . pantoprazole (PROTONIX) 40 MG tablet Take 40 mg by mouth daily.  . pravastatin (PRAVACHOL) 20 MG tablet Take 20 mg by mouth daily.  . tadalafil (CIALIS) 20 MG tablet Take 20 mg by mouth daily as needed for erectile dysfunction.    No Known Allergies  Past Medical History  Diagnosis Date  . History of TIA (transient ischemic attack) may 2010  . HYPERTENSION 09/18/2006  . GERD 09/18/2006  . 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  . ANXIETY 07/27/2008  . ALCOHOL ABUSE 07/27/2008  . LOW BACK PAIN 09/18/2006  . Rosacea 07/27/2008  . Hx of adenomatous colonic polyps   . Bifascicular block     beta blocker stopped due to profound bradycardia  . CAD 07/27/2008    a. s/p Endeavor DES to CFX 07/2008; residual OM1 70%, EF 45%;  b. relook cath 5/10: patent stent in CFX  . Urinary tract infection start cipro 01-07-2012  . SEIZURE DISORDER 09/18/2006    none since 2008  . Numbness and tingling     finger tips  at times  . Myocardial infarction 2010  . Hx of cardiovascular stress test     ETT-Myoview (09/2013):  Partially fixed defect with some reversibility - cannot r/o inf ischemia vs diaph atten, EF 51%; mod risk    ROS: Negative except as per HPI  BP 130/90  Pulse 80  Ht 5\' 8"  (1.727 m)  Wt 209 lb 4.8 oz (94.938 kg)  BMI 31.83 kg/m2  PHYSICAL EXAM: Pt is alert and oriented, NAD HEENT: normal Neck: JVP - normal, carotids 2+= without bruits Lungs: CTA  bilaterally CV: RRR without murmur or gallop Abd: soft, NT, Positive BS, no hepatomegaly Ext: no C/C/E, distal pulses intact and equal Skin: warm/dry no rash  Lexiscan Myoview: QPS  Raw Data Images: Diaphragmatic attenuation and increased gut uptake on rest images.  Stress Images: There is decreased uptake in the inferior wall.  Rest Images: There is decreased uptake in the inferior wall.  Subtraction (SDS): There is a fixed inferior defect with some reversibility that is most consistent with variations in diaphragmatic attenuation as well as gut uptake seen only on rest images but cannot rule out a small area of ischemia in the inferior wall and inferoapex.  Transient Ischemic Dilatation (Normal <1.22): 1.00  Lung/Heart Ratio (Normal <0.45): 0.41  Quantitative Gated Spect Images  QGS EDV: 109 ml  QGS ESV: 53 ml  Impression  Exercise Capacity: Good exercise capacity.  BP Response: Normal blood pressure response.  Clinical Symptoms: No symptoms.  ECG Impression: No significant ST segment change suggestive of ischemia.  Comparison with Prior Nuclear Study: No images to compare  Overall Impression: Moderate risk stress nuclear study with a partially fixed defect in the inferior wall with some reversibility. Cannot rule out inferior ischemia vs. variations in diaphragmatic attenuation as well as interfering gut uptake on rest images..  LV Ejection Fraction: 51%. LV Wall Motion: NL LV Function; NL Wall Motion  Signed:  Fransico Him, MD  [facility]  ASSESSMENT AND PLAN: 1. Coronary artery disease, native vessel, with atypical angina and progressive exertional dyspnea (CCS class II). The patient has a right moderate risk Myoview scan which was reviewed in detail with the patient and his wife today. I have recommended cardiac catheterization and possible PCI for definitive evaluation. Risks include but are not limited to vascular injury, bleeding, stroke, myocardial infarction, emergency  CABG, and death. The patient understands these risks occur at low frequency (less than 1%) and agrees to proceed.  2. Hypertension. Blood pressure control  3. Hyperlipidemia. The patient is on a statin drug. Lipids are followed by his primary care physician  Disposition: Pending cardiac catheterization results.  [name] 10/01/2013 9:54 AM

## 2013-10-01 NOTE — Patient Instructions (Signed)
Your physician recommends that you continue on your current medications as directed. Please refer to the Current Medication list given to you today. Your physician has requested that you have a cardiac catheterization. Cardiac catheterization is used to diagnose and/or treat various heart conditions. Doctors may recommend this procedure for a number of different reasons. The most common reason is to evaluate chest pain. Chest pain can be a symptom of coronary artery disease (CAD), and cardiac catheterization can show whether plaque is narrowing or blocking your heart's arteries. This procedure is also used to evaluate the valves, as well as measure the blood flow and oxygen levels in different parts of your heart. For further information please visit [email or web address]. Please follow instruction sheet, as given.  Your physician recommends that you return for lab work in: Wed Oct 14, 2013

## 2013-10-07 ENCOUNTER — Encounter (HOSPITAL_COMMUNITY): Payer: Self-pay | Admitting: Pharmacy Technician

## 2013-10-08 ENCOUNTER — Telehealth: Payer: Self-pay | Admitting: Cardiovascular Disease

## 2013-10-08 NOTE — Telephone Encounter (Signed)
I spoke with the pt and made him aware that handwashing is the most important way to prevent the spread of germs.  I advised the pt that at this point there is nothing that needs to be done.  If the pt develops an signs of infection prior to cardiac cath then he should contact our office.

## 2013-10-08 NOTE — Telephone Encounter (Signed)
New message     Pt will have cath on 8-19.  His son is in the hospital and has [place].  Patient touched son before he knew he has this infection.  Should he do anything to prevent himself from getting this infection or is it too late?

## 2013-10-14 ENCOUNTER — Other Ambulatory Visit (INDEPENDENT_AMBULATORY_CARE_PROVIDER_SITE_OTHER): Payer: 59

## 2013-10-14 ENCOUNTER — Other Ambulatory Visit: Payer: Self-pay | Admitting: Internal Medicine

## 2013-10-14 DIAGNOSIS — R931 Abnormal findings on diagnostic imaging of heart and coronary circulation: Secondary | ICD-10-CM

## 2013-10-14 DIAGNOSIS — R9439 Abnormal result of other cardiovascular function study: Secondary | ICD-10-CM

## 2013-10-14 DIAGNOSIS — I2510 Atherosclerotic heart disease of native coronary artery without angina pectoris: Secondary | ICD-10-CM

## 2013-10-14 DIAGNOSIS — I209 Angina pectoris, unspecified: Secondary | ICD-10-CM

## 2013-10-14 DIAGNOSIS — I25119 Atherosclerotic heart disease of native coronary artery with unspecified angina pectoris: Secondary | ICD-10-CM

## 2013-10-14 LAB — BASIC METABOLIC PANEL
BUN: 15 mg/dL (ref 6–23)
CALCIUM: 8.8 mg/dL (ref 8.4–10.5)
CO2: 27 meq/L (ref 19–32)
Chloride: 106 mEq/L (ref 96–112)
Creatinine, Ser: 1.1 mg/dL (ref 0.4–1.5)
GFR: 73.86 mL/min (ref >60.00)
GLUCOSE: 122 mg/dL — AB (ref 70–99)
Potassium: 4.1 mEq/L (ref 3.5–5.1)
Sodium: 138 mEq/L (ref 135–145)

## 2013-10-14 LAB — CBC WITH DIFFERENTIAL/PLATELET
Basophils Absolute: 0.1 10*3/uL (ref 0.0–0.1)
Basophils Relative: 0.9 % (ref 0.0–3.0)
EOS ABS: 0.2 10*3/uL (ref 0.0–0.7)
Eosinophils Relative: 3.3 % (ref 0.0–5.0)
HEMATOCRIT: 43.3 % (ref 39.0–52.0)
Hemoglobin: 14.6 g/dL (ref 13.0–17.0)
LYMPHS PCT: 25 % (ref 12.0–46.0)
Lymphs Abs: 1.5 10*3/uL (ref 0.7–4.0)
MCHC: 33.6 g/dL (ref 30.0–36.0)
MCV: 92.3 fl (ref 78.0–100.0)
MONOS PCT: 12.2 % — AB (ref 3.0–12.0)
Monocytes Absolute: 0.7 10*3/uL (ref 0.1–1.0)
Neutro Abs: 3.5 10*3/uL (ref 1.4–7.7)
Neutrophils Relative %: 58.6 % (ref 43.0–77.0)
PLATELETS: 134 10*3/uL — AB (ref 150.0–400.0)
RBC: 4.69 Mil/uL (ref 4.22–5.81)
RDW: 13.5 % (ref 11.5–15.5)
WBC: 6 10*3/uL (ref 4.0–10.5)

## 2013-10-14 LAB — PROTIME-INR
INR: 1 ratio (ref 0.8–1.0)
Prothrombin Time: 11.2 s (ref 9.6–13.1)

## 2013-10-14 LAB — APTT: aPTT: 28.3 s (ref 23.4–32.7)

## 2013-10-21 ENCOUNTER — Ambulatory Visit (HOSPITAL_COMMUNITY)
Admission: RE | Admit: 2013-10-21 | Discharge: 2013-10-21 | Disposition: A | Discharge status: Home / Self Care | Payer: 59 | Source: Ambulatory Visit | Attending: Cardiovascular Disease | Admitting: Cardiovascular Disease

## 2013-10-21 ENCOUNTER — Encounter (HOSPITAL_COMMUNITY)
Admission: RE | Disposition: A | Discharge status: Home / Self Care | Payer: Self-pay | Source: Ambulatory Visit | Attending: Cardiovascular Disease

## 2013-10-21 DIAGNOSIS — I452 Bifascicular block: Secondary | ICD-10-CM | POA: Diagnosis not present

## 2013-10-21 DIAGNOSIS — M545 Low back pain, unspecified: Secondary | ICD-10-CM | POA: Diagnosis not present

## 2013-10-21 DIAGNOSIS — F411 Generalized anxiety disorder: Secondary | ICD-10-CM | POA: Insufficient documentation

## 2013-10-21 DIAGNOSIS — Z7902 Long term (current) use of antithrombotics/antiplatelets: Secondary | ICD-10-CM | POA: Insufficient documentation

## 2013-10-21 DIAGNOSIS — E785 Hyperlipidemia, unspecified: Secondary | ICD-10-CM | POA: Diagnosis not present

## 2013-10-21 DIAGNOSIS — Z8673 Personal history of transient ischemic attack (TIA), and cerebral infarction without residual deficits: Secondary | ICD-10-CM | POA: Insufficient documentation

## 2013-10-21 DIAGNOSIS — I2510 Atherosclerotic heart disease of native coronary artery without angina pectoris: Secondary | ICD-10-CM | POA: Diagnosis not present

## 2013-10-21 DIAGNOSIS — F1010 Alcohol abuse, uncomplicated: Secondary | ICD-10-CM | POA: Diagnosis not present

## 2013-10-21 DIAGNOSIS — I10 Essential (primary) hypertension: Secondary | ICD-10-CM | POA: Insufficient documentation

## 2013-10-21 DIAGNOSIS — Z9861 Coronary angioplasty status: Secondary | ICD-10-CM | POA: Insufficient documentation

## 2013-10-21 DIAGNOSIS — I252 Old myocardial infarction: Secondary | ICD-10-CM | POA: Diagnosis not present

## 2013-10-21 DIAGNOSIS — K219 Gastro-esophageal reflux disease without esophagitis: Secondary | ICD-10-CM | POA: Diagnosis not present

## 2013-10-21 DIAGNOSIS — Z7982 Long term (current) use of aspirin: Secondary | ICD-10-CM | POA: Insufficient documentation

## 2013-10-21 DIAGNOSIS — Z791 Long term (current) use of non-steroidal anti-inflammatories (NSAID): Secondary | ICD-10-CM | POA: Insufficient documentation

## 2013-10-21 DIAGNOSIS — R0602 Shortness of breath: Secondary | ICD-10-CM | POA: Diagnosis present

## 2013-10-21 HISTORY — PX: LEFT HEART CATHETERIZATION WITH CORONARY ANGIOGRAM: SHX5451

## 2013-10-21 LAB — CK TOTAL AND CKMB (NOT AT ARMC)
CK, MB: 2.1 ng/mL (ref 0.3–4.0)
RELATIVE INDEX: 1.6 (ref 0.0–2.5)
Total CK: 132 U/L (ref 7–232)

## 2013-10-21 SURGERY — LEFT HEART CATHETERIZATION WITH CORONARY ANGIOGRAM
Anesthesia: LOCAL

## 2013-10-21 MED ORDER — SODIUM CHLORIDE 0.9 % IJ SOLN: 3.0000 mL | INTRAMUSCULAR | Status: DC | PRN

## 2013-10-21 MED ORDER — MIDAZOLAM HCL 2 MG/2ML IJ SOLN
INTRAMUSCULAR | Status: AC
  Filled 2013-10-21: qty 2

## 2013-10-21 MED ORDER — NITROGLYCERIN 1 MG/10 ML FOR IR/CATH LAB
INTRA_ARTERIAL | Status: AC
  Filled 2013-10-21: qty 10

## 2013-10-21 MED ORDER — ASPIRIN 81 MG PO CHEW: 81.0000 mg | CHEWABLE_TABLET | ORAL | Status: DC

## 2013-10-21 MED ORDER — LIDOCAINE HCL (PF) 1 % IJ SOLN
INTRAMUSCULAR | Status: AC
  Filled 2013-10-21: qty 30

## 2013-10-21 MED ORDER — FENTANYL CITRATE 0.05 MG/ML IJ SOLN
INTRAMUSCULAR | Status: AC
  Filled 2013-10-21: qty 2

## 2013-10-21 MED ORDER — SODIUM CHLORIDE 0.9 % IV SOLN
250.0000 mL | INTRAVENOUS | Status: DC | PRN
Start: 2013-10-21 — End: 2013-10-21

## 2013-10-21 MED ORDER — SODIUM CHLORIDE 0.9 % IJ SOLN: 3.0000 mL | Freq: Two times a day (BID) | INTRAMUSCULAR | Status: DC

## 2013-10-21 MED ORDER — VERAPAMIL HCL 2.5 MG/ML IV SOLN
INTRAVENOUS | Status: AC
  Filled 2013-10-21: qty 2

## 2013-10-21 MED ORDER — ASPIRIN 81 MG PO CHEW
CHEWABLE_TABLET | ORAL | Status: AC
  Administered 2013-10-21: 81 mg
  Filled 2013-10-21: qty 1

## 2013-10-21 MED ORDER — HEPARIN SODIUM (PORCINE) 1000 UNIT/ML IJ SOLN
INTRAMUSCULAR | Status: AC
  Filled 2013-10-21: qty 1

## 2013-10-21 MED ORDER — SODIUM CHLORIDE 0.9 % IV SOLN
INTRAVENOUS | Status: DC
  Administered 2013-10-21: 07:00:00 via INTRAVENOUS

## 2013-10-21 MED ORDER — ACETAMINOPHEN 325 MG PO TABS: 650.0000 mg | ORAL_TABLET | ORAL | Status: DC | PRN

## 2013-10-21 MED ORDER — ONDANSETRON HCL 4 MG/2ML IJ SOLN: 4.0000 mg | Freq: Four times a day (QID) | INTRAMUSCULAR | Status: DC | PRN

## 2013-10-21 MED ORDER — HEPARIN (PORCINE) IN NACL 2-0.9 UNIT/ML-% IJ SOLN
INTRAMUSCULAR | Status: AC
  Filled 2013-10-21: qty 1000

## 2013-10-21 MED ORDER — SODIUM CHLORIDE 0.9 % IV SOLN: 1.0000 mL/kg/h | INTRAVENOUS | Status: DC

## 2013-10-21 NOTE — Research (Signed)
Bioflow Informed Consent   Subject Name: Jose [name]  Subject met inclusion and exclusion criteria.  The informed consent form, study requirements and expectations were reviewed with the subject and questions and concerns were addressed prior to the signing of the consent form.  The subject verbalized understanding of the trail requirements.  The subject agreed to participate in the Bioflow trial and signed the informed consent.  The informed consent was obtained prior to performance of any protocol-specific procedures for the subject.  A copy of the signed informed consent was given to the subject and a copy was placed in the subject's medical record.  Sandie Ano 10/21/2013, 7:50

## 2013-10-21 NOTE — Interval H&P Note (Signed)
History and Physical Interval Note:  10/21/2013 8:36 AM  [name]  has presented today for surgery, with the diagnosis of cp/shortness of breath  The various methods of treatment have been discussed with the patient and family. After consideration of risks, benefits and other options for treatment, the patient has consented to  Procedure(s): LEFT HEART CATHETERIZATION WITH CORONARY ANGIOGRAM (N/A) as a surgical intervention .  The patient's history has been reviewed, patient examined, no change in status, stable for surgery.  I have reviewed the patient's chart and labs.  Questions were answered to the patient's satisfaction.    Cath Lab Visit (complete for each Cath Lab visit)  Clinical Evaluation Leading to the Procedure:   ACS: No.  Non-ACS:    Anginal Classification: CCS II  Anti-ischemic medical therapy: No Therapy  Non-Invasive Test Results: Intermediate-risk stress test findings: cardiac mortality 1-3%/year  Prior CABG: No previous CABG       [name]

## 2013-10-21 NOTE — CV Procedure (Signed)
    Cardiac Catheterization Procedure Note  Name: Jose [name] MRN: [id number] DOB: 1950/05/30  Procedure: Left Heart Cath, Selective Coronary Angiography, LV angiography  Indication: Shortness of breath, moderate risk stress Myoview.    Procedural Details: The right wrist was prepped, draped, and anesthetized with 1% lidocaine. Using the modified Seldinger technique, a 5/6 French Slender sheath was introduced into the right radial artery. 3 mg of verapamil was administered through the sheath, weight-based unfractionated heparin was administered intravenously. Standard Judkins catheters were used for selective coronary angiography and left ventriculography. Catheter exchanges were performed over an exchange length guidewire. There were no immediate procedural complications. A TR band was used for radial hemostasis at the completion of the procedure.  The patient was transferred to the post catheterization recovery area for further monitoring.  Procedural Findings: Hemodynamics: AO 139/66 LV 138/5  Coronary angiography: Coronary dominance: Left  Left mainstem: The left main stem is widely patent with no obstructive disease. The vessel divides into the LAD and left circumflex.  Left anterior descending (LAD): The LAD is patent to the apex of the heart. The vessel has minor luminal irregularity. There is an eccentric plaque in the proximal LAD with about 30% associated stenosis. There is no other significant disease noted throughout the distribution of the LAD. The diagonal branches are patent.  Left circumflex (LCx): The left circumflex is a large, dominant vessel. The proximal circumflex is patent with minor narrowing at the ostium. The first obtuse marginal branch divides into twin vessels. At its bifurcation point, there is a 50% stenosis leading into the main branch. This is unchanged from the 2010 study. The ostium of the subbranch also has 50% stenosis, unchanged from the previous study.  The AV circumflex in the midportion is stented. There is no significant in-stent restenosis. This leads into a left PDA branch.  Right coronary artery (RCA): The right coronary artery is medium in caliber. The vessel supplies 2 RV marginal branches without significant stenosis.  Left ventriculography: Left ventricular systolic function is normal, LVEF is estimated at 55-65%, there is no significant mitral regurgitation   Contrast: 90 mL of Omnipaque  Radiation dose/Fluoro time: 3.2 minutes  Estimated Blood Loss: Minimal  Final Conclusions:   1. Single-vessel coronary artery disease with continued patency of the stented segment in the mid left circumflex and mild to moderate stenosis of the first obtuse marginal branch 2. Mild nonobstructive LAD stenosis 3. Small, nondominant RCA 4. Normal LV systolic function  Recommendations: The patient's films were compared to his previous study from 2010. There is no change in coronary anatomy and there clearly is stability without any significant progression of obstructive CAD. Recommend ongoing risk reduction measures.  [name] MD, [facility] 10/21/2013, 9:12 AM

## 2013-10-21 NOTE — H&P (View-Only) (Signed)
HPI:  63 year old gentleman presenting for followup of coronary artery disease and recent nuclear stress test. The patient underwent stenting of the left circumflex in 2010 with an endeavor drug-eluting stent. He was noted to have residual 70% stenosis in the first obtuse marginal branch. 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 The patient was evaluated in the emergency department on July 4 with chest pain. He ruled out for myocardial infarction. He was seen in the office on July 14 by Richardson Dopp. He described left-sided chest pressure and left arm aching at rest. The patient also had exertional dyspnea, progressive over about 6 months. A stress Myoview was performed and this demonstrated inferior scar with peri-infarct ischemia. It was interpreted as a "moderate risk study." He presents today for followup discussion.  He has occasional sharp pains in the center of his chest. These are transient in nature and unrelated to physical exertion. He rides a recumbent bike for exercise. He has become limited by shortness of breath which has been slowly progressive over the past 6 months. He has not had leg swelling, orthopnea, PND, cough, or wheezing. He has no resting shortness of breath. He has no other complaints today. He has tolerated long-term dual antiplatelet therapy with aspirin and Plavix without bleeding problems.  Outpatient Encounter Prescriptions as of 10/01/2013  Medication Sig  . ALPRAZolam (XANAX) 0.5 MG tablet Take 0.5 mg by mouth 3 (three) times daily as needed for anxiety.  Marland Kitchen aspirin 81 MG tablet Take 81 mg by mouth every morning.   . Azelaic Acid (FINACEA) 15 % cream Apply 1 application topically as directed. After skin is thoroughly washed and patted dry, gently but thoroughly massage a thin film of azelaic acid cream into the affected area twice daily, in the morning and evening. Applied to  face.  . benazepril (LOTENSIN) 40 MG tablet Take 40 mg by mouth daily.  . clopidogrel (PLAVIX) 75 MG tablet Take 75 mg by mouth daily.  . diclofenac sodium (VOLTAREN) 1 % GEL Apply 2 g topically 2 (two) times daily as needed. Apply to painful areas around knees  . halobetasol (ULTRAVATE) 0.05 % cream Apply 1 application topically as directed.   . Multiple Vitamin (MULTIVITAMIN WITH MINERALS) TABS Take 1 tablet by mouth daily.  . nitroGLYCERIN (NITROSTAT) 0.4 MG SL tablet Place 0.4 mg under the tongue every 5 (five) minutes as needed for chest pain.  . pantoprazole (PROTONIX) 40 MG tablet Take 40 mg by mouth daily.  . pravastatin (PRAVACHOL) 20 MG tablet Take 20 mg by mouth daily.  . tadalafil (CIALIS) 20 MG tablet Take 20 mg by mouth daily as needed for erectile dysfunction.    No Known Allergies  Past Medical History  Diagnosis Date  . History of TIA (transient ischemic attack) may 2010  . HYPERTENSION 09/18/2006  . GERD 09/18/2006  . 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  . ANXIETY 07/27/2008  . ALCOHOL ABUSE 07/27/2008  . LOW BACK PAIN 09/18/2006  . Rosacea 07/27/2008  . Hx of adenomatous colonic polyps   . Bifascicular block     beta blocker stopped due to profound bradycardia  . CAD 07/27/2008    a. s/p Endeavor DES to CFX 07/2008; residual OM1 70%, EF 45%;  b. relook cath 5/10: patent stent in CFX  . Urinary tract infection start cipro 01-07-2012  . SEIZURE DISORDER 09/18/2006    none since 2008  . Numbness and tingling     finger tips  at times  . Myocardial infarction 2010  . Hx of cardiovascular stress test     ETT-Myoview (09/2013):  Partially fixed defect with some reversibility - cannot r/o inf ischemia vs diaph atten, EF 51%; mod risk    ROS: Negative except as per HPI  BP 130/90  Pulse 80  Ht 5\' 8"  (1.727 m)  Wt 209 lb 4.8 oz (94.938 kg)  BMI 31.83 kg/m2  PHYSICAL EXAM: Pt is alert and oriented, NAD HEENT: normal Neck: JVP - normal, carotids 2+= without bruits Lungs: CTA  bilaterally CV: RRR without murmur or gallop Abd: soft, NT, Positive BS, no hepatomegaly Ext: no C/C/E, distal pulses intact and equal Skin: warm/dry no rash  Lexiscan Myoview: QPS  Raw Data Images: Diaphragmatic attenuation and increased gut uptake on rest images.  Stress Images: There is decreased uptake in the inferior wall.  Rest Images: There is decreased uptake in the inferior wall.  Subtraction (SDS): There is a fixed inferior defect with some reversibility that is most consistent with variations in diaphragmatic attenuation as well as gut uptake seen only on rest images but cannot rule out a small area of ischemia in the inferior wall and inferoapex.  Transient Ischemic Dilatation (Normal <1.22): 1.00  Lung/Heart Ratio (Normal <0.45): 0.41  Quantitative Gated Spect Images  QGS EDV: 109 ml  QGS ESV: 53 ml  Impression  Exercise Capacity: Good exercise capacity.  BP Response: Normal blood pressure response.  Clinical Symptoms: No symptoms.  ECG Impression: No significant ST segment change suggestive of ischemia.  Comparison with Prior Nuclear Study: No images to compare  Overall Impression: Moderate risk stress nuclear study with a partially fixed defect in the inferior wall with some reversibility. Cannot rule out inferior ischemia vs. variations in diaphragmatic attenuation as well as interfering gut uptake on rest images..  LV Ejection Fraction: 51%. LV Wall Motion: NL LV Function; NL Wall Motion  Signed:  Fransico Him, MD  [facility]  ASSESSMENT AND PLAN: 1. Coronary artery disease, native vessel, with atypical angina and progressive exertional dyspnea (CCS class II). The patient has a right moderate risk Myoview scan which was reviewed in detail with the patient and his wife today. I have recommended cardiac catheterization and possible PCI for definitive evaluation. Risks include but are not limited to vascular injury, bleeding, stroke, myocardial infarction, emergency  CABG, and death. The patient understands these risks occur at low frequency (less than 1%) and agrees to proceed.  2. Hypertension. Blood pressure control  3. Hyperlipidemia. The patient is on a statin drug. Lipids are followed by his primary care physician  Disposition: Pending cardiac catheterization results.  [name] 10/01/2013 9:54 AM

## 2013-10-21 NOTE — Discharge Instructions (Signed)
Radial Site Care °Refer to this sheet in the next few weeks. These instructions provide you with information on caring for yourself after your procedure. Your caregiver may also give you more specific instructions. Your treatment has been planned according to current medical practices, but problems sometimes occur. Call your caregiver if you have any problems or questions after your procedure. °HOME CARE INSTRUCTIONS °· You may shower the day after the procedure. Remove the bandage (dressing) and gently wash the site with plain soap and water. Gently pat the site dry. °· Do not apply powder or lotion to the site. °· Do not submerge the affected site in water for 3 to 5 days. °· Inspect the site at least twice daily. °· Do not flex or bend the affected arm for 24 hours. °· No lifting over 5 pounds (2.3 kg) for 5 days after your procedure. °· Do not drive home if you are discharged the same day of the procedure. Have someone else drive you. °· You may drive 24 hours after the procedure unless otherwise instructed by your caregiver. °· Do not operate machinery or power tools for 24 hours. °· A responsible adult should be with you for the first 24 hours after you arrive home. °What to expect: °· Any bruising will usually fade within 1 to 2 weeks. °· Blood that collects in the tissue (hematoma) may be painful to the touch. It should usually decrease in size and tenderness within 1 to 2 weeks. °SEEK IMMEDIATE MEDICAL CARE IF: °· You have unusual pain at the radial site. °· You have redness, warmth, swelling, or pain at the radial site. °· You have drainage (other than a small amount of blood on the dressing). °· You have chills. °· You have a fever or persistent symptoms for more than 72 hours. °· You have a fever and your symptoms suddenly get worse. °· Your arm becomes pale, cool, tingly, or numb. °· You have heavy bleeding from the site. Hold pressure on the site. °Document Released: 03/24/2010 Document Revised:  05/14/2011 Document Reviewed: 03/24/2010 °[employer]® Patient Information ©2015 [employer]. This information is not intended to replace advice given to you by your health care provider. Make sure you discuss any questions you have with your health care provider. ° °

## 2013-10-27 ENCOUNTER — Ambulatory Visit: Payer: 59 | Admitting: Physician Assistant

## 2013-11-01 ENCOUNTER — Other Ambulatory Visit: Payer: Self-pay | Admitting: Internal Medicine

## 2013-11-10 ENCOUNTER — Ambulatory Visit: Payer: 59 | Admitting: Physician Assistant

## 2013-11-11 ENCOUNTER — Ambulatory Visit: Payer: 59 | Admitting: Physician Assistant

## 2013-11-17 ENCOUNTER — Encounter: Payer: Self-pay | Admitting: Physician Assistant

## 2013-11-17 ENCOUNTER — Ambulatory Visit (INDEPENDENT_AMBULATORY_CARE_PROVIDER_SITE_OTHER): Payer: 59 | Admitting: Physician Assistant

## 2013-11-17 VITALS — BP 130/80 | HR 82 | Ht 67.0 in | Wt 211.0 lb

## 2013-11-17 DIAGNOSIS — E785 Hyperlipidemia, unspecified: Secondary | ICD-10-CM

## 2013-11-17 DIAGNOSIS — I10 Essential (primary) hypertension: Secondary | ICD-10-CM

## 2013-11-17 DIAGNOSIS — I2510 Atherosclerotic heart disease of native coronary artery without angina pectoris: Secondary | ICD-10-CM

## 2013-11-17 NOTE — Progress Notes (Signed)
Cardiology Office Note    Date:  11/17/2013   ID:  Jose [name], DOB [birth year], MRN [id number]  PCP:  Nyoka Cowden, MD  Cardiologist:  Dr. Glori Bickers >>> Dr. [name]      History of Present Illness: Jose [name] is a 63 y.o. male with a hx of CAD, s/p Endeavor DES to the CFX in 07/2008. He had residual 70% stenosis in the OM1. Follow up cath a few weeks after PCI demonstrated patent stent and EF 45%. Other hx includes HTN, HL, GERD, bifascicular block with RBBB/LAFB.  He was seen in the ED 7/4 with chest pain.  I saw him in FU.  I set him up for a Myoview which returned with moderate risk findings.  He saw Dr. Burt Knack in FU who set him up for a heart cath.  LHC demonstrated patent stent in the CFX and mod non-obstructive disease in the OM1 (unchanged from 2010).    He returns for FU.  The patient denies chest pain, shortness of breath, syncope, orthopnea, PND or significant pedal edema.    Studies:  - Echo (09/2005):  EF 55-60%, no RWMA  - Nuclear (7/15):  Partially fixed inf defect with some reversibility, EF 51%; Mod Risk  - LHC (8/15):  prox LAD 30%, OM1 50% at bifurcation, OM1 sub-branch 50%, mid AVCFX stent patent, EF 55-65% - no change from 2010 >>> Med Rx   Recent Labs: 10/14/2013: Creatinine 1.1; Hemoglobin 14.6; Potassium 4.1   Wt Readings from Last 3 Encounters:  11/17/13 211 lb (95.709 kg)  10/21/13 200 lb (90.719 kg)  10/21/13 200 lb (90.719 kg)     Past Medical History  Diagnosis Date  . History of TIA (transient ischemic attack) may 2010  . HYPERTENSION 09/18/2006  . GERD 09/18/2006  . 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  . ANXIETY 07/27/2008  . ALCOHOL ABUSE 07/27/2008  . LOW BACK PAIN 09/18/2006  . Rosacea 07/27/2008  . Hx of adenomatous colonic polyps   . Bifascicular block     beta blocker stopped due to profound bradycardia  . CAD 07/27/2008    a. s/p Endeavor DES to CFX 07/2008; residual OM1 70%, EF 45%;  b. relook cath 5/10: patent stent in  CFX  . Urinary tract infection start cipro 01-07-2012  . SEIZURE DISORDER 09/18/2006    none since 2008  . Numbness and tingling     finger tips at times  . Myocardial infarction 2010  . Hx of cardiovascular stress test     ETT-Myoview (09/2013):  Partially fixed defect with some reversibility - cannot r/o inf ischemia vs diaph atten, EF 51%; mod risk    Current Outpatient Prescriptions  Medication Sig Dispense Refill  . ALPRAZolam (XANAX) 0.5 MG tablet TAKE 1 TABLET BY MOUTH 3 TIMES A DAY AS NEEDED FOR ANXIETY  60 tablet  2  . aspirin 81 MG tablet Take 81 mg by mouth every morning.       . Azelaic Acid (FINACEA) 15 % cream Apply 1 application topically as directed. After skin is thoroughly washed and patted dry, gently but thoroughly massage a thin film of azelaic acid cream into the affected area twice daily, in the morning and evening. Applied to face.      . benazepril (LOTENSIN) 40 MG tablet Take 40 mg by mouth daily.      . clopidogrel (PLAVIX) 75 MG tablet Take 75 mg by mouth daily.      . diclofenac sodium (VOLTAREN) 1 % GEL  Apply 2 g topically 2 (two) times daily as needed. Apply to painful areas around knees      . halobetasol (ULTRAVATE) 0.05 % cream Apply 1 application topically as directed.       . Multiple Vitamin (MULTIVITAMIN WITH MINERALS) TABS Take 1 tablet by mouth daily.      . nitroGLYCERIN (NITROSTAT) 0.4 MG SL tablet Place 0.4 mg under the tongue every 5 (five) minutes as needed for chest pain.      . pantoprazole (PROTONIX) 40 MG tablet Take 40 mg by mouth daily.      . pravastatin (PRAVACHOL) 20 MG tablet Take 20 mg by mouth daily.      . tadalafil (CIALIS) 20 MG tablet Take 20 mg by mouth daily as needed for erectile dysfunction.       No current facility-administered medications for this visit.    Allergies:   Review of patient's allergies indicates no known allergies.   Social History:  The patient  reports that he has never smoked. He quit smokeless tobacco  use about 21 months ago. His smokeless tobacco use included Chew. He reports that he does not drink alcohol or use illicit drugs.   Family History:  The patient's family history includes Diabetes in his mother; Emphysema in his father; Heart attack in an other family member; Heart failure in his father; Hyperlipidemia in his mother; Hypertension in his mother; Lung cancer in his mother. There is no history of Colon cancer, Heart attack, or Stroke.   ROS:  Please see the history of present illness.      All other systems reviewed and negative.   PHYSICAL EXAM: VS:  BP 130/80  Pulse 82  Ht 5\' 7"  (1.702 m)  Wt 211 lb (95.709 kg)  BMI 33.04 kg/m2 Well nourished, well developed, in no acute distress HEENT: normal Neck: no JVD Cardiac:  normal S1, S2; RRR; no murmur Lungs:  clear to auscultation bilaterally, no wheezing, rhonchi or rales Abd: soft, nontender, no hepatomegaly Ext: no edemaright wrist without hematoma or mass  Skin: warm and dry Neuro:  CNs 2-12 intact, no focal abnormalities noted  EKG:  NSR, HR 82, RBBB   ASSESSMENT AND PLAN:  1. CAD:   No angina. Recent cath with patent CFX stent and stable anatomy.  Continue aspirin, Plavix, statin. He is not on beta blocker given bradycardia. 2. Ischemic cardiomyopathy: EF was previously 45% at cardiac catheterization, but normal on recent study. 3. HYPERTENSION:  Controlled. 4. HYPERLIPIDEMIA:  Continue statin. Managed by primary care.  Disposition: FU with Dr. [name] in 6 mos.   Signed, Versie Starks, MHS 11/17/2013 4:32 PM    [facility] [address], [place], [place]  [address] Phone: [phone]; Fax: [phone]

## 2013-11-17 NOTE — Patient Instructions (Addendum)
Continue all of your current medications.  Schedule follow up with Dr. [name] in 6 months.

## 2013-11-24 LAB — HEPATIC FUNCTION PANEL
ALT: 47 U/L — AB (ref 10–40)
AST: 41 U/L — AB (ref 14–40)
Alkaline Phosphatase: 61 U/L (ref 25–125)
BILIRUBIN, TOTAL: 0.6 mg/dL

## 2013-11-24 LAB — HEMOGLOBIN A1C: Hgb A1c MFr Bld: 6.8 % — AB (ref 4.0–6.0)

## 2013-11-24 LAB — LIPID PANEL
Cholesterol: 166 mg/dL (ref 0–200)
HDL: 41 mg/dL (ref 35–70)
LDL Cholesterol: 94 mg/dL
LDl/HDL Ratio: 4
Triglycerides: 156 mg/dL (ref 40–160)

## 2013-11-24 LAB — CBC AND DIFFERENTIAL
HCT: 46 % (ref 41–53)
Hemoglobin: 15.1 g/dL (ref 13.5–17.5)
Platelets: 140 10*3/uL — AB (ref 150–399)
WBC: 6.2 10^3/mL

## 2013-11-24 LAB — BASIC METABOLIC PANEL WITH GFR
BUN: 12 mg/dL (ref 4–21)
Creatinine: 0.9 mg/dL (ref 0.6–1.3)
Glucose: 101 mg/dL
Potassium: 4.1 mmol/L (ref 3.4–5.3)
Sodium: 138 mmol/L (ref 137–147)

## 2013-11-24 LAB — TSH: TSH: 6.79 u[IU]/mL — AB (ref 0.41–5.90)

## 2013-12-03 ENCOUNTER — Other Ambulatory Visit: Payer: Self-pay | Admitting: Internal Medicine

## 2013-12-18 ENCOUNTER — Other Ambulatory Visit: Payer: Self-pay

## 2013-12-18 ENCOUNTER — Encounter: Payer: Self-pay | Admitting: Internal Medicine

## 2013-12-21 ENCOUNTER — Ambulatory Visit (INDEPENDENT_AMBULATORY_CARE_PROVIDER_SITE_OTHER): Payer: 59 | Admitting: Internal Medicine

## 2013-12-21 ENCOUNTER — Encounter: Payer: Self-pay | Admitting: *Deleted

## 2013-12-21 ENCOUNTER — Encounter: Payer: Self-pay | Admitting: Internal Medicine

## 2013-12-21 VITALS — BP 128/70 | HR 69 | Temp 97.8°F | Resp 20 | Ht 67.0 in | Wt 211.0 lb

## 2013-12-21 DIAGNOSIS — R7989 Other specified abnormal findings of blood chemistry: Secondary | ICD-10-CM | POA: Insufficient documentation

## 2013-12-21 DIAGNOSIS — E119 Type 2 diabetes mellitus without complications: Secondary | ICD-10-CM | POA: Insufficient documentation

## 2013-12-21 DIAGNOSIS — Z0000 Encounter for general adult medical examination without abnormal findings: Secondary | ICD-10-CM

## 2013-12-21 DIAGNOSIS — R946 Abnormal results of thyroid function studies: Secondary | ICD-10-CM

## 2013-12-21 MED ORDER — METFORMIN HCL ER (MOD) 500 MG PO TB24: 1000.0000 mg | ORAL_TABLET | Freq: Every day | ORAL | Status: DC

## 2013-12-21 MED ORDER — ALPRAZOLAM 0.5 MG PO TABS: ORAL_TABLET | ORAL | Status: DC

## 2013-12-21 NOTE — Patient Instructions (Addendum)
Please check your hemoglobin A1c every 3 months  Limit your sodium (Salt) intake    It is important that you exercise regularly, at least 20 minutes 3 to 4 times per week.  If you develop chest pain or shortness of breath seek  medical attention.Diabetes and Exercise Exercising regularly is important. It is not just about losing weight. It has many health benefits, such as:  Improving your overall fitness, flexibility, and endurance.  Increasing your bone density.  Helping with weight control.  Decreasing your body fat.  Increasing your muscle strength.  Reducing stress and tension.  Improving your overall health. People with diabetes who exercise gain additional benefits because exercise:  Reduces appetite.  Improves the body's use of blood sugar (glucose).  Helps lower or control blood glucose.  Decreases blood pressure.  Helps control blood lipids (such as cholesterol and triglycerides).  Improves the body's use of the hormone insulin by:  Increasing the body's insulin sensitivity.  Reducing the body's insulin needs.  Decreases the risk for heart disease because exercising:  Lowers cholesterol and triglycerides levels.  Increases the levels of good cholesterol (such as high-density lipoproteins [HDL]) in the body.  Lowers blood glucose levels. YOUR ACTIVITY PLAN  Choose an activity that you enjoy and set realistic goals. Your health care provider or diabetes educator can help you make an activity plan that works for you. Exercise regularly as directed by your health care provider. This includes:  Performing resistance training twice a week such as push-ups, sit-ups, lifting weights, or using resistance bands.  Performing 150 minutes of cardio exercises each week such as walking, running, or playing sports.  Staying active and spending no more than 90 minutes at one time being inactive. Even short bursts of exercise are good for you. Three 10-minute sessions  spread throughout the day are just as beneficial as a single 30-minute session. Some exercise ideas include:  Taking the dog for a walk.  Taking the stairs instead of the elevator.  Dancing to your favorite song.  Doing an exercise video.  Doing your favorite exercise with a friend. RECOMMENDATIONS FOR EXERCISING WITH TYPE 1 OR TYPE 2 DIABETES   Check your blood glucose before exercising. If blood glucose levels are greater than 240 mg/dL, check for urine ketones. Do not exercise if ketones are present.  Avoid injecting insulin into areas of the body that are going to be exercised. For example, avoid injecting insulin into:  The arms when playing tennis.  The legs when jogging.  Keep a record of:  Food intake before and after you exercise.  Expected peak times of insulin action.  Blood glucose levels before and after you exercise.  The type and amount of exercise you have done.  Review your records with your health care provider. Your health care provider will help you to develop guidelines for adjusting food intake and insulin amounts before and after exercising.  If you take insulin or oral hypoglycemic agents, watch for signs and symptoms of hypoglycemia. They include:  Dizziness.  Shaking.  Sweating.  Chills.  Confusion.  Drink plenty of water while you exercise to prevent dehydration or heat stroke. Body water is lost during exercise and must be replaced.  Talk to your health care provider before starting an exercise program to make sure it is safe for you. Remember, almost any type of activity is better than none. Document Released: 05/12/2003 Document Revised: 07/06/2013 Document Reviewed: 07/29/2012 [facility] Patient Information 2015 [place], [place]. This information is  not intended to replace advice given to you by your health care provider. Make sure you discuss any questions you have with your health care provider. Diabetes Mellitus and Food It is  important for you to manage your blood sugar (glucose) level. Your blood glucose level can be greatly affected by what you eat. Eating healthier foods in the appropriate amounts throughout the day at about the same time each day will help you control your blood glucose level. It can also help slow or prevent worsening of your diabetes mellitus. Healthy eating may even help you improve the level of your blood pressure and reach or maintain a healthy weight.  HOW CAN FOOD AFFECT ME? Carbohydrates Carbohydrates affect your blood glucose level more than any other type of food. Your dietitian will help you determine how many carbohydrates to eat at each meal and teach you how to count carbohydrates. Counting carbohydrates is important to keep your blood glucose at a healthy level, especially if you are using insulin or taking certain medicines for diabetes mellitus. Alcohol Alcohol can cause sudden decreases in blood glucose (hypoglycemia), especially if you use insulin or take certain medicines for diabetes mellitus. Hypoglycemia can be a life-threatening condition. Symptoms of hypoglycemia (sleepiness, dizziness, and disorientation) are similar to symptoms of having too much alcohol.  If your health care provider has given you approval to drink alcohol, do so in moderation and use the following guidelines:  Women should not have more than one drink per day, and men should not have more than two drinks per day. One drink is equal to:  12 oz of beer.  5 oz of wine.  1 oz of hard liquor.  Do not drink on an empty stomach.  Keep yourself hydrated. Have water, diet soda, or unsweetened iced tea.  Regular soda, juice, and other mixers might contain a lot of carbohydrates and should be counted. WHAT FOODS ARE NOT RECOMMENDED? As you make food choices, it is important to remember that all foods are not the same. Some foods have fewer nutrients per serving than other foods, even though they might have the  same number of calories or carbohydrates. It is difficult to get your body what it needs when you eat foods with fewer nutrients. Examples of foods that you should avoid that are high in calories and carbohydrates but low in nutrients include:  Trans fats (most processed foods list trans fats on the Nutrition Facts label).  Regular soda.  Juice.  Candy.  Sweets, such as cake, pie, doughnuts, and cookies.  Fried foods. WHAT FOODS CAN I EAT? Have nutrient-rich foods, which will nourish your body and keep you healthy. The food you should eat also will depend on several factors, including:  The calories you need.  The medicines you take.  Your weight.  Your blood glucose level.  Your blood pressure level.  Your cholesterol level. You also should eat a variety of foods, including:  Protein, such as meat, poultry, fish, tofu, nuts, and seeds (lean animal proteins are best).  Fruits.  Vegetables.  Dairy products, such as milk, cheese, and yogurt (low fat is best).  Breads, grains, pasta, cereal, rice, and beans.  Fats such as olive oil, trans fat-free margarine, canola oil, avocado, and olives. DOES EVERYONE WITH DIABETES MELLITUS HAVE THE SAME MEAL PLAN? Because every person with diabetes mellitus is different, there is not one meal plan that works for everyone. It is very important that you meet with a dietitian who will help  you create a meal plan that is just right for you. Document Released: 11/16/2004 Document Revised: 02/24/2013 Document Reviewed: 01/16/2013 [facility] Patient Information 2015 [place], [place]. This information is not intended to replace advice given to you by your health care provider. Make sure you discuss any questions you have with your health care provider.

## 2013-12-21 NOTE — Progress Notes (Signed)
Subjective:    Patient ID: Jose [name], male    DOB: August 09, 1950, 63 y.o.   MRN: [id number]  HPI  63 year old patient who is seen today for a health maintenance examination.  He is followed by cardiology due to coronary artery disease and bifascicular block. Doing quite well today.  He states that he is abstinent from alcohol for a number of years. Laboratory studies from work revealed a mildly elevated TSH but has been present in the past. Hemoglobin A1c 6.8.  He does have a long history of impaired with those times.  Fasting blood sugar 101  Past Medical History  Diagnosis Date  . History of TIA (transient ischemic attack) may 2010  . HYPERTENSION 09/18/2006  . GERD 09/18/2006  . 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  . ANXIETY 07/27/2008  . ALCOHOL ABUSE 07/27/2008  . LOW BACK PAIN 09/18/2006  . Rosacea 07/27/2008  . Hx of adenomatous colonic polyps   . Bifascicular block     beta blocker stopped due to profound bradycardia  . Urinary tract infection start cipro 01-07-2012  . SEIZURE DISORDER 09/18/2006    none since 2008  . Numbness and tingling     finger tips at times  . Myocardial infarction 2010  . Hx of cardiovascular stress test     ETT-Myoview (09/2013):  Partially fixed defect with some reversibility - cannot r/o inf ischemia vs diaph atten, EF 51%; mod risk  . CAD 07/27/2008    a. s/p Endeavor DES to CFX 07/2008; residual OM1 70%, EF 45%;  b. relook cath 5/10: patent stent in CFX;  c.  LHC (8/15):  prox LAD 30%, OM1 50% at bifurcation, OM1 sub-branch 50%, mid AVCFX stent patent, EF 55-65% - no change from 2010 >>> Med Rx (after abnormal nuc)     History   Social History  . Marital Status: Married    Spouse Name: N/A    Number of Children: 2  . Years of Education: N/A   Occupational History  . safety director/transportation    Social History Main Topics  . Smoking status: Never Smoker   . Smokeless tobacco: Former Systems developer    Types: [place] date: 02/03/2012  . Alcohol  Use: No     Comment: past drinker 2-3 daily and sometimes binge drinks on the weekend  . Drug Use: No  . Sexual Activity: Not on file   Other Topics Concern  . Not on file   Social History Narrative  . No narrative on file    Past Surgical History  Procedure Laterality Date  . Knee surgery    . Coronary stent placement      drug eluting  . Inguinal hernia repair  01/14/2012    Procedure: LAPAROSCOPIC INGUINAL HERNIA;  Surgeon: Ralene Ok, MD;  Location: [facility];  Service: General;  Laterality: Right;  . Insertion of mesh  01/14/2012    Procedure: INSERTION OF MESH;  Surgeon: Ralene Ok, MD;  Location: [facility];  Service: General;  Laterality: Right;  . Hernia repair      Family History  Problem Relation Age of Onset  . Lung cancer Mother   . Hypertension Mother   . Emphysema Father   . Heart attack    . Colon cancer Neg Hx   . Diabetes Mother   . Hyperlipidemia Mother   . Heart failure Father   . Heart attack Neg Hx   . Stroke Neg Hx     No Known Allergies  Current Outpatient Prescriptions on File Prior to Visit  Medication Sig Dispense Refill  . aspirin 81 MG tablet Take 81 mg by mouth every morning.       . Azelaic Acid (FINACEA) 15 % cream Apply 1 application topically as directed. After skin is thoroughly washed and patted dry, gently but thoroughly massage a thin film of azelaic acid cream into the affected area twice daily, in the morning and evening. Applied to face.      . benazepril (LOTENSIN) 40 MG tablet Take 40 mg by mouth daily.      . clopidogrel (PLAVIX) 75 MG tablet Take 75 mg by mouth daily.      . clopidogrel (PLAVIX) 75 MG tablet TAKE 1 TABLET (75 MG TOTAL) BY MOUTH DAILY.  90 tablet  1  . diclofenac sodium (VOLTAREN) 1 % GEL Apply 2 g topically 2 (two) times daily as needed. Apply to painful areas around knees      . halobetasol (ULTRAVATE) 0.05 % cream Apply 1 application topically as directed.       . Multiple Vitamin (MULTIVITAMIN WITH  MINERALS) TABS Take 1 tablet by mouth daily.      . nitroGLYCERIN (NITROSTAT) 0.4 MG SL tablet Place 0.4 mg under the tongue every 5 (five) minutes as needed for chest pain.      . pantoprazole (PROTONIX) 40 MG tablet Take 40 mg by mouth daily.      . pravastatin (PRAVACHOL) 20 MG tablet Take 20 mg by mouth daily.      . tadalafil (CIALIS) 20 MG tablet Take 20 mg by mouth daily as needed for erectile dysfunction.       No current facility-administered medications on file prior to visit.    BP 128/70  Pulse 69  Temp(Src) 97.8 F (36.6 C) (Oral)  Resp 20  Ht 5\' 7"  (1.702 m)  Wt 211 lb (95.709 kg)  BMI 33.04 kg/m2  SpO2 98%      Review of Systems  Constitutional: Negative for fever, chills, appetite change and fatigue.  HENT: Negative for congestion, dental problem, ear pain, hearing loss, sore throat, tinnitus, trouble swallowing and voice change.   Eyes: Negative for pain, discharge and visual disturbance.  Respiratory: Negative for cough, chest tightness, wheezing and stridor.   Cardiovascular: Negative for chest pain, palpitations and leg swelling.  Gastrointestinal: Negative for nausea, vomiting, abdominal pain, diarrhea, constipation, blood in stool and abdominal distention.  Genitourinary: Negative for urgency, hematuria, flank pain, discharge, difficulty urinating and genital sores.  Musculoskeletal: Negative for arthralgias, back pain, gait problem, joint swelling, myalgias and neck stiffness.  Skin: Negative for rash.  Neurological: Negative for dizziness, syncope, speech difficulty, weakness, numbness and headaches.  Hematological: Negative for adenopathy. Does not bruise/bleed easily.  Psychiatric/Behavioral: Negative for behavioral problems and dysphoric mood. The patient is not nervous/anxious.        Objective:   Physical Exam  Constitutional: He appears well-developed and well-nourished.  Weight 211  HENT:  Head: Normocephalic and atraumatic.  Right Ear:  External ear normal.  Left Ear: External ear normal.  Nose: Nose normal.  Mouth/Throat: Oropharynx is clear and moist.  Eyes: Conjunctivae and EOM are normal. Pupils are equal, round, and reactive to light. No scleral icterus.  Neck: Normal range of motion. Neck supple. No JVD present. No thyromegaly present.  Cardiovascular: Regular rhythm, normal heart sounds and intact distal pulses.  Exam reveals no gallop and no friction rub.   No murmur heard. Pulmonary/Chest: Effort normal and  breath sounds normal. He exhibits no tenderness.  Abdominal: Soft. Bowel sounds are normal. He exhibits no distension and no mass. There is no tenderness.  Ventral hernia  Genitourinary: Prostate normal and penis normal.  Musculoskeletal: Normal range of motion. He exhibits no edema and no tenderness.  Lymphadenopathy:    He has no cervical adenopathy.  Neurological: He is alert. He has normal reflexes. No cranial nerve deficit. Coordination normal.  Skin: Skin is warm and dry. No rash noted.  Psychiatric: He has a normal mood and affect. His behavior is normal.          Assessment & Plan:   Preventive health exam Diabetes mellitus.  We'll continue efforts at weight loss and exercise.  We'll start metformin therapy 1000 mg daily.  Recheck 3 months with hemoglobin A1c and TSH Dyslipidemia Coronary artery disease.  Continue statin therapy Hypertension stable

## 2013-12-21 NOTE — Progress Notes (Signed)
Pre visit review using our clinic review tool, if applicable. No additional management support is needed unless otherwise documented below in the visit note. 

## 2013-12-28 ENCOUNTER — Encounter: Payer: Self-pay | Admitting: Internal Medicine

## 2013-12-30 MED ORDER — CYCLOBENZAPRINE HCL 10 MG PO TABS: 10.0000 mg | ORAL_TABLET | Freq: Three times a day (TID) | ORAL | Status: DC | PRN

## 2014-01-08 ENCOUNTER — Other Ambulatory Visit: Payer: Self-pay | Admitting: Internal Medicine

## 2014-01-15 ENCOUNTER — Encounter: Payer: Self-pay | Admitting: Internal Medicine

## 2014-01-28 ENCOUNTER — Other Ambulatory Visit: Payer: Self-pay | Admitting: Internal Medicine

## 2014-02-11 ENCOUNTER — Encounter (HOSPITAL_COMMUNITY): Payer: Self-pay | Admitting: Cardiovascular Disease

## 2014-03-04 ENCOUNTER — Other Ambulatory Visit: Payer: Self-pay | Admitting: Internal Medicine

## 2014-03-08 ENCOUNTER — Encounter: Payer: Self-pay | Admitting: Internal Medicine

## 2014-03-09 MED ORDER — CLOPIDOGREL BISULFATE 75 MG PO TABS: ORAL_TABLET | ORAL | Status: DC

## 2014-03-23 ENCOUNTER — Encounter: Payer: Self-pay | Admitting: Internal Medicine

## 2014-03-23 ENCOUNTER — Ambulatory Visit (INDEPENDENT_AMBULATORY_CARE_PROVIDER_SITE_OTHER): Payer: 59 | Admitting: Internal Medicine

## 2014-03-23 VITALS — BP 130/80 | HR 55 | Temp 98.0°F | Resp 20 | Ht 67.0 in | Wt 203.0 lb

## 2014-03-23 DIAGNOSIS — I10 Essential (primary) hypertension: Secondary | ICD-10-CM

## 2014-03-23 DIAGNOSIS — I2510 Atherosclerotic heart disease of native coronary artery without angina pectoris: Secondary | ICD-10-CM

## 2014-03-23 DIAGNOSIS — E119 Type 2 diabetes mellitus without complications: Secondary | ICD-10-CM

## 2014-03-23 NOTE — Progress Notes (Signed)
Pre visit review using our clinic review tool, if applicable. No additional management support is needed unless otherwise documented below in the visit note. 

## 2014-03-23 NOTE — Patient Instructions (Signed)
Limit your sodium (Salt) intake    It is important that you exercise regularly, at least 20 minutes 3 to 4 times per week.  If you develop chest pain or shortness of breath seek  medical attention.  Return in 6 months for follow-up  

## 2014-03-23 NOTE — Progress Notes (Signed)
Subjective:    Patient ID: Jose [name], male    DOB: [birth year], 64 y.o.   MRN: [id number]  HPI  64 year old patient who is seen today for follow-up of type 2 diabetes.  3 months ago.  Hemoglobin A1c 6.8, and he has been on metformin therapy, which he tolerates well.  Recent hemoglobin A1c 6.5.  No concerns or complaints.  He has tolerated metformin therapy well.  He has CAD, which has been stable  Past Medical History  Diagnosis Date  . History of TIA (transient ischemic attack) may 2010  . HYPERTENSION 09/18/2006  . GERD 09/18/2006  . 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  . ANXIETY 07/27/2008  . ALCOHOL ABUSE 07/27/2008  . LOW BACK PAIN 09/18/2006  . Rosacea 07/27/2008  . Hx of adenomatous colonic polyps   . Bifascicular block     beta blocker stopped due to profound bradycardia  . Urinary tract infection start cipro 01-07-2012  . SEIZURE DISORDER 09/18/2006    none since 2008  . Numbness and tingling     finger tips at times  . Myocardial infarction 2010  . Hx of cardiovascular stress test     ETT-Myoview (09/2013):  Partially fixed defect with some reversibility - cannot r/o inf ischemia vs diaph atten, EF 51%; mod risk  . CAD 07/27/2008    a. s/p Endeavor DES to CFX 07/2008; residual OM1 70%, EF 45%;  b. relook cath 5/10: patent stent in CFX;  c.  LHC (8/15):  prox LAD 30%, OM1 50% at bifurcation, OM1 sub-branch 50%, mid AVCFX stent patent, EF 55-65% - no change from 2010 >>> Med Rx (after abnormal nuc)     History   Social History  . Marital Status: Married    Spouse Name: N/A    Number of Children: 2  . Years of Education: N/A   Occupational History  . safety director/transportation    Social History Main Topics  . Smoking status: Never Smoker   . Smokeless tobacco: Former Systems developer    Types: [place] date: 02/03/2012  . Alcohol Use: No     Comment: past drinker 2-3 daily and sometimes binge drinks on the weekend  . Drug Use: No  . Sexual Activity: Not on file   Other Topics  Concern  . Not on file   Social History Narrative    Past Surgical History  Procedure Laterality Date  . Knee surgery    . Coronary stent placement      drug eluting  . Inguinal hernia repair  01/14/2012    Procedure: LAPAROSCOPIC INGUINAL HERNIA;  Surgeon: Ralene Ok, MD;  Location: [facility];  Service: General;  Laterality: Right;  . Insertion of mesh  01/14/2012    Procedure: INSERTION OF MESH;  Surgeon: Ralene Ok, MD;  Location: [facility];  Service: General;  Laterality: Right;  . Hernia repair    . Left heart catheterization with coronary angiogram N/A 10/21/2013    Procedure: LEFT HEART CATHETERIZATION WITH CORONARY ANGIOGRAM;  Surgeon: Blane Ohara, MD;  Location: [facility] CATH LAB;  Service: Cardiovascular;  Laterality: N/A;    Family History  Problem Relation Age of Onset  . Lung cancer Mother   . Hypertension Mother   . Emphysema Father   . Heart attack    . Colon cancer Neg Hx   . Diabetes Mother   . Hyperlipidemia Mother   . Heart failure Father   . Heart attack Neg Hx   . Stroke Neg Hx  No Known Allergies  Current Outpatient Prescriptions on File Prior to Visit  Medication Sig Dispense Refill  . ALPRAZolam (XANAX) 0.5 MG tablet TAKE 1 TABLET BY MOUTH 3 TIMES A DAY AS NEEDED FOR ANXIETY 60 tablet 5  . aspirin 81 MG tablet Take 81 mg by mouth every morning.     . Azelaic Acid (FINACEA) 15 % cream Apply 1 application topically as directed. After skin is thoroughly washed and patted dry, gently but thoroughly massage a thin film of azelaic acid cream into the affected area twice daily, in the morning and evening. Applied to face.    . benazepril (LOTENSIN) 40 MG tablet TAKE 1 TABLET BY MOUTH EVERY DAY 90 tablet 1  . clopidogrel (PLAVIX) 75 MG tablet TAKE 1 TABLET (75 MG TOTAL) BY MOUTH DAILY. 90 tablet 3  . cyclobenzaprine (FLEXERIL) 10 MG tablet Take 1 tablet (10 mg total) by mouth 3 (three) times daily as needed for muscle spasms. 30 tablet 2  . diclofenac  sodium (VOLTAREN) 1 % GEL Apply 2 g topically 2 (two) times daily as needed. Apply to painful areas around knees    . halobetasol (ULTRAVATE) 0.05 % cream Apply 1 application topically as directed.     . metFORMIN (GLUMETZA) 500 MG (MOD) 24 hr tablet Take 2 tablets (1,000 mg total) by mouth daily with breakfast. 180 tablet 6  . Multiple Vitamin (MULTIVITAMIN WITH MINERALS) TABS Take 1 tablet by mouth daily.    . nitroGLYCERIN (NITROSTAT) 0.4 MG SL tablet Place 0.4 mg under the tongue every 5 (five) minutes as needed for chest pain.    . pantoprazole (PROTONIX) 40 MG tablet Take 40 mg by mouth daily.    . pravastatin (PRAVACHOL) 20 MG tablet TAKE 1 TABLET (20 MG TOTAL) BY MOUTH DAILY. 90 tablet 1  . tadalafil (CIALIS) 20 MG tablet Take 20 mg by mouth daily as needed for erectile dysfunction.    . vitamin C (ASCORBIC ACID) 500 MG tablet Take 500 mg by mouth daily.     No current facility-administered medications on file prior to visit.    BP 130/80 mmHg  Pulse 55  Temp(Src) 98 F (36.7 C) (Oral)  Resp 20  Ht 5\' 7"  (1.702 m)  Wt 203 lb (92.08 kg)  BMI 31.79 kg/m2  SpO2 95%     Review of Systems  Constitutional: Negative for fever, chills, appetite change and fatigue.  HENT: Negative for congestion, dental problem, ear pain, hearing loss, sore throat, tinnitus, trouble swallowing and voice change.   Eyes: Negative for pain, discharge and visual disturbance.  Respiratory: Negative for cough, chest tightness, wheezing and stridor.   Cardiovascular: Negative for chest pain, palpitations and leg swelling.  Gastrointestinal: Negative for nausea, vomiting, abdominal pain, diarrhea, constipation, blood in stool and abdominal distention.  Genitourinary: Negative for urgency, hematuria, flank pain, discharge, difficulty urinating and genital sores.  Musculoskeletal: Negative for myalgias, back pain, joint swelling, arthralgias, gait problem and neck stiffness.  Skin: Negative for rash.    Neurological: Negative for dizziness, syncope, speech difficulty, weakness, numbness and headaches.  Hematological: Negative for adenopathy. Does not bruise/bleed easily.  Psychiatric/Behavioral: Negative for behavioral problems and dysphoric mood. The patient is not nervous/anxious.        Objective:   Physical Exam  Constitutional: He is oriented to person, place, and time. He appears well-developed.  HENT:  Head: Normocephalic.  Right Ear: External ear normal.  Left Ear: External ear normal.  Eyes: Conjunctivae and EOM are normal.  Neck: Normal range of motion.  Cardiovascular: Normal rate and normal heart sounds.   Pulmonary/Chest: Breath sounds normal.  Abdominal: Bowel sounds are normal.  Musculoskeletal: Normal range of motion. He exhibits no edema or tenderness.  Neurological: He is alert and oriented to person, place, and time.  Psychiatric: He has a normal mood and affect. His behavior is normal.          Assessment & Plan:   Diabetes mellitus.  Well-controlled.  We'll continue aggressive lifestyle modification and metformin therapy Hypertension, stable CAD.  Follow-up cardiology Recheck 6 months

## 2014-03-29 ENCOUNTER — Encounter: Payer: Self-pay | Admitting: Internal Medicine

## 2014-04-13 ENCOUNTER — Encounter: Payer: Self-pay | Admitting: Internal Medicine

## 2014-04-13 NOTE — Telephone Encounter (Signed)
Spoke to pt, told him last physical was Oct and follow up visit Jan I can give you copies of both visits if you like. Pt said yes he will come by and pick them up. Told him okay will be at the front desk. Pt verbalized understanding.

## 2014-04-14 ENCOUNTER — Other Ambulatory Visit: Payer: Self-pay | Admitting: Internal Medicine

## 2014-04-21 ENCOUNTER — Encounter: Payer: Self-pay | Admitting: Internal Medicine

## 2014-04-22 ENCOUNTER — Telehealth: Payer: Self-pay | Admitting: Cardiovascular Disease

## 2014-04-22 NOTE — Telephone Encounter (Signed)
New message    Request for surgical clearance:  1. What type of surgery is being performed? Scope on left knee   2. When is this surgery scheduled? Pending in the next two week    3. Are there any medications that need to be held prior to surgery and how long? Plavix to be advise by Dr. Burt Knack   4. Name of physician performing surgery? Dr. Veverly Fells at [facility]  5. What is your office phone and fax number? [phone].

## 2014-04-26 ENCOUNTER — Encounter: Payer: Self-pay | Admitting: Internal Medicine

## 2014-04-27 ENCOUNTER — Encounter: Payer: Self-pay | Admitting: Internal Medicine

## 2014-04-27 NOTE — Telephone Encounter (Signed)
Ok to proceed with surgery. Cath last year showed stable nonobstructive CAD. May hold plavix 7 days before surgery and resume when safe after surgery. thx  [name] 04/27/2014 2:47 PM

## 2014-04-27 NOTE — Telephone Encounter (Signed)
I spoke with the Jose [name] and made him aware of Dr Antionette Char recommendation.  I will fax this note to Andee Poles at [facility] [phone]).

## 2014-05-04 ENCOUNTER — Telehealth: Payer: Self-pay | Admitting: Cardiovascular Disease

## 2014-05-04 NOTE — Telephone Encounter (Signed)
04/29/14-Surgical clearance faxed to [facility], rmf.

## 2014-05-20 ENCOUNTER — Ambulatory Visit (INDEPENDENT_AMBULATORY_CARE_PROVIDER_SITE_OTHER): Payer: 59 | Admitting: Cardiovascular Disease

## 2014-05-20 ENCOUNTER — Encounter: Payer: Self-pay | Admitting: Cardiovascular Disease

## 2014-05-20 ENCOUNTER — Other Ambulatory Visit: Payer: Self-pay

## 2014-05-20 VITALS — BP 126/72 | HR 64 | Ht 66.0 in | Wt 199.8 lb

## 2014-05-20 DIAGNOSIS — E785 Hyperlipidemia, unspecified: Secondary | ICD-10-CM

## 2014-05-20 DIAGNOSIS — I10 Essential (primary) hypertension: Secondary | ICD-10-CM

## 2014-05-20 DIAGNOSIS — I2510 Atherosclerotic heart disease of native coronary artery without angina pectoris: Secondary | ICD-10-CM

## 2014-05-20 NOTE — Patient Instructions (Addendum)
Your physician wants you to follow-up in: 1 YEAR with Dr Cooper.  You will receive a reminder letter in the mail two months in advance. If you don't receive a letter, please call our office to schedule the follow-up appointment.  Your physician recommends that you continue on your current medications as directed. Please refer to the Current Medication list given to you today.  

## 2014-05-20 NOTE — Progress Notes (Signed)
Cardiology Office Note   Date:  05/20/2014   ID:  Jose [name], DOB 10/18/1950, MRN [id number]  PCP:  Nyoka Cowden, MD  Cardiologist:  [name], MD    No chief complaint on file.    History of Present Illness: Jose [name] is a 64 y.o. male who presents for follow-up of coronary artery disease. He underwent DES placement in the LCx in 2010. He had chest pain in 2015 and was evaluated in the ER. A Myoview scan showed moderate risk findings and cath was arranged. This demonstrated a patent stent site and moderate nonobstructive disease in the OM without change from previous.study in 2010.  The patient has injured his left knee. Arthroscopic surgery is planned. He denies chest pain, shortness of breath, edema, or lightheadedness.   Past Medical History  Diagnosis Date  . History of TIA (transient ischemic attack) may 2010  . HYPERTENSION 09/18/2006  . GERD 09/18/2006  . 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  . ANXIETY 07/27/2008  . ALCOHOL ABUSE 07/27/2008  . LOW BACK PAIN 09/18/2006  . Rosacea 07/27/2008  . Hx of adenomatous colonic polyps   . Bifascicular block     beta blocker stopped due to profound bradycardia  . Urinary tract infection start cipro 01-07-2012  . SEIZURE DISORDER 09/18/2006    none since 2008  . Numbness and tingling     finger tips at times  . Myocardial infarction 2010  . Hx of cardiovascular stress test     ETT-Myoview (09/2013):  Partially fixed defect with some reversibility - cannot r/o inf ischemia vs diaph atten, EF 51%; mod risk  . CAD 07/27/2008    a. s/p Endeavor DES to CFX 07/2008; residual OM1 70%, EF 45%;  b. relook cath 5/10: patent stent in CFX;  c.  LHC (8/15):  prox LAD 30%, OM1 50% at bifurcation, OM1 sub-branch 50%, mid AVCFX stent patent, EF 55-65% - no change from 2010 >>> Med Rx (after abnormal nuc)     Past Surgical History  Procedure Laterality Date  . Knee surgery    . Coronary stent placement      drug eluting  . Inguinal  hernia repair  01/14/2012    Procedure: LAPAROSCOPIC INGUINAL HERNIA;  Surgeon: Ralene Ok, MD;  Location: [facility];  Service: General;  Laterality: Right;  . Insertion of mesh  01/14/2012    Procedure: INSERTION OF MESH;  Surgeon: Ralene Ok, MD;  Location: [facility];  Service: General;  Laterality: Right;  . Hernia repair    . Left heart catheterization with coronary angiogram N/A 10/21/2013    Procedure: LEFT HEART CATHETERIZATION WITH CORONARY ANGIOGRAM;  Surgeon: Blane Ohara, MD;  Location: [facility] CATH LAB;  Service: Cardiovascular;  Laterality: N/A;    Current Outpatient Prescriptions  Medication Sig Dispense Refill  . ALPRAZolam (XANAX) 0.5 MG tablet TAKE 1 TABLET BY MOUTH 3 TIMES A DAY AS NEEDED FOR ANXIETY 60 tablet 5  . aspirin 81 MG tablet Take 81 mg by mouth every morning.     . Azelaic Acid (FINACEA) 15 % cream Apply 1 application topically as directed. After skin is thoroughly washed and patted dry, gently but thoroughly massage a thin film of azelaic acid cream into the affected area twice daily, in the morning and evening. Applied to face.    . benazepril (LOTENSIN) 40 MG tablet TAKE 1 TABLET BY MOUTH EVERY DAY 90 tablet 1  . clopidogrel (PLAVIX) 75 MG tablet TAKE 1 TABLET (75 MG TOTAL) BY MOUTH  DAILY. 90 tablet 3  . cyclobenzaprine (FLEXERIL) 10 MG tablet Take 1 tablet (10 mg total) by mouth 3 (three) times daily as needed for muscle spasms. 30 tablet 2  . diclofenac sodium (VOLTAREN) 1 % GEL Apply 2 g topically 2 (two) times daily as needed. Apply to painful areas around knees    . halobetasol (ULTRAVATE) 0.05 % cream Apply 1 application topically as directed.     . metFORMIN (GLUMETZA) 500 MG (MOD) 24 hr tablet Take 2 tablets (1,000 mg total) by mouth daily with breakfast. 180 tablet 6  . Multiple Vitamin (MULTIVITAMIN WITH MINERALS) TABS Take 1 tablet by mouth daily.    . nitroGLYCERIN (NITROSTAT) 0.4 MG SL tablet Place 0.4 mg under the tongue every 5 (five) minutes as  needed for chest pain.    . pantoprazole (PROTONIX) 40 MG tablet Take 40 mg by mouth daily.    . pravastatin (PRAVACHOL) 20 MG tablet TAKE 1 TABLET (20 MG TOTAL) BY MOUTH DAILY. 90 tablet 1  . tadalafil (CIALIS) 20 MG tablet Take 20 mg by mouth daily as needed for erectile dysfunction.    . vitamin C (ASCORBIC ACID) 500 MG tablet Take 500 mg by mouth daily.     No current facility-administered medications for this visit.    Allergies:   Review of patient's allergies indicates no known allergies.   Social History:  The patient  reports that he has never smoked. He quit smokeless tobacco use about 2 years ago. His smokeless tobacco use included Chew. He reports that he does not drink alcohol or use illicit drugs.   Family History:  The patient's  family history includes Diabetes in his mother; Emphysema in his father; Heart attack in an other family member; Heart failure in his father; Hyperlipidemia in his mother; Hypertension in his mother; Lung cancer in his mother. There is no history of Colon cancer, Heart attack, or Stroke.    PHYSICAL EXAM: VS:  BP 126/72 mmHg  Pulse 64  Ht 5\' 6"  (1.676 m)  Wt 199 lb 12.8 oz (90.629 kg)  BMI 32.26 kg/m2 , BMI Body mass index is 32.26 kg/(m^2). GEN: Well nourished, well developed, in no acute distress HEENT: normal Neck: no JVD, no masses. No carotid bruits Cardiac: RRR without murmur or gallop                Respiratory:  clear to auscultation bilaterally, normal work of breathing GI: soft, nontender, nondistended, + BS MS: no deformity or atrophy Ext: no pretibial edema, pedal pulses 2+= bilaterally Skin: warm and dry, no rash Neuro:  Strength and sensation are intact Psych: euthymic mood, full affect  EKG:  EKG is ordered today. The ekg ordered today shows NSR with RBBB and LAFB  Recent Labs: 11/24/2013: ALT 47*; BUN 12; Creatinine 0.9; Hemoglobin 15.1; Platelets 140*; Potassium 4.1; Sodium 138; TSH 6.79*   Lipid Panel     Component  Value Date/Time   CHOL 166 11/24/2013   TRIG 156 11/24/2013   HDL 41 11/24/2013   CHOLHDL 4 01/21/2012 0919   VLDL 30.0 01/21/2012 0919   LDLCALC 94 11/24/2013   LDLDIRECT 163.3 07/09/2007 0818      Wt Readings from Last 3 Encounters:  05/20/14 199 lb 12.8 oz (90.629 kg)  03/23/14 203 lb (92.08 kg)  12/21/13 211 lb (95.709 kg)     Cardiac Studies Reviewed: - Nuclear (7/15): Partially fixed inf defect with some reversibility, EF 51%; Mod Risk - LHC (8/15): prox LAD 30%,  OM1 50% at bifurcation, OM1 sub-branch 50%, mid AVCFX stent patent, EF 55-65% - no change from 2010 >>> Med Rx  ASSESSMENT AND PLAN: 1.  CAD, native vessel: clinically stable without angina. Continue same Rx. At low risk of holding antiplatelet Rx before knee surgery. Should resume post-operatively when safe from bleeding perspective.  2. Essential HTN: well-controlled on current Rx  3. Hyperlipidemia: treated by PCP.   Current medicines are reviewed with the patient today.  The patient does not have concerns regarding medicines.  The following changes have been made:  no change  Labs/ tests ordered today include:   Orders Placed This Encounter  Procedures  . EKG 12-Lead    Disposition:   FU one year  Signed, [name], MD  05/20/2014 10:44 PM    [facility] [place], [place], [place]  [address] Phone: [phone]; Fax: [phone]

## 2014-06-01 ENCOUNTER — Telehealth: Payer: Self-pay | Admitting: *Deleted

## 2014-06-01 NOTE — Telephone Encounter (Signed)
Left message on Jose [name]'s voicemail regarding fax received for office notes for upcoming surgery. I will fax over last office visit, but pt sees Cardiology Dr. [name] you will need to contact them for recent EKG and cardiology notes.

## 2014-07-25 ENCOUNTER — Other Ambulatory Visit: Payer: Self-pay | Admitting: Internal Medicine

## 2014-08-30 ENCOUNTER — Other Ambulatory Visit: Payer: Self-pay

## 2014-09-21 ENCOUNTER — Ambulatory Visit: Payer: 59 | Admitting: Internal Medicine

## 2014-10-21 ENCOUNTER — Other Ambulatory Visit: Payer: Self-pay | Admitting: Internal Medicine

## 2014-11-28 ENCOUNTER — Other Ambulatory Visit: Payer: Self-pay | Admitting: Internal Medicine

## 2014-12-01 ENCOUNTER — Other Ambulatory Visit: Payer: Self-pay | Admitting: Internal Medicine

## 2014-12-20 ENCOUNTER — Ambulatory Visit (INDEPENDENT_AMBULATORY_CARE_PROVIDER_SITE_OTHER): Payer: 59 | Admitting: Internal Medicine

## 2014-12-20 ENCOUNTER — Encounter: Payer: Self-pay | Admitting: Internal Medicine

## 2014-12-20 VITALS — BP 130/80 | HR 76 | Temp 98.1°F | Resp 20 | Ht 66.0 in | Wt 206.0 lb

## 2014-12-20 DIAGNOSIS — E119 Type 2 diabetes mellitus without complications: Secondary | ICD-10-CM | POA: Diagnosis not present

## 2014-12-20 DIAGNOSIS — I2510 Atherosclerotic heart disease of native coronary artery without angina pectoris: Secondary | ICD-10-CM

## 2014-12-20 LAB — HEMOGLOBIN A1C: HEMOGLOBIN A1C: 6.8 % — AB (ref 4.6–6.5)

## 2014-12-20 NOTE — Patient Instructions (Signed)
Limit your sodium (Salt) intake    It is important that you exercise regularly, at least 20 minutes 3 to 4 times per week.  If you develop chest pain or shortness of breath seek  medical attention.  You need to lose weight.  Consider a lower calorie diet and regular exercise. 

## 2014-12-20 NOTE — Progress Notes (Signed)
Subjective:    Patient ID: Jose [name], male    DOB: Dec 26, 1950, 64 y.o.   MRN: [id number]  HPI  64 year old patient who has a history of type 2 diabetes which has been controlled on metformin therapy.  Lab Results  Component Value Date   HGBA1C 6.8* 11/24/2013   Wt Readings from Last 3 Encounters:  12/20/14 206 lb (93.441 kg)  05/20/14 199 lb 12.8 oz (90.629 kg)  03/23/14 203 lb (92.08 kg)   He has hypertension and coronary artery disease which has been stable.  Remains on statin therapy.  No cardiopulmonary complaints.     BP Readings from Last 3 Encounters:  12/20/14 130/80  05/20/14 126/72  03/23/14 130/80   Past Medical History  Diagnosis Date  . History of TIA (transient ischemic attack) may 2010  . HYPERTENSION 09/18/2006  . GERD 09/18/2006  . 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  . ANXIETY 07/27/2008  . ALCOHOL ABUSE 07/27/2008  . LOW BACK PAIN 09/18/2006  . Rosacea 07/27/2008  . Hx of adenomatous colonic polyps   . Bifascicular block     beta blocker stopped due to profound bradycardia  . Urinary tract infection start cipro 01-07-2012  . SEIZURE DISORDER 09/18/2006    none since 2008  . Numbness and tingling     finger tips at times  . Myocardial infarction ([place]) 2010  . Hx of cardiovascular stress test     ETT-Myoview (09/2013):  Partially fixed defect with some reversibility - cannot r/o inf ischemia vs diaph atten, EF 51%; mod risk  . CAD 07/27/2008    a. s/p Endeavor DES to CFX 07/2008; residual OM1 70%, EF 45%;  b. relook cath 5/10: patent stent in CFX;  c.  LHC (8/15):  prox LAD 30%, OM1 50% at bifurcation, OM1 sub-branch 50%, mid AVCFX stent patent, EF 55-65% - no change from 2010 >>> Med Rx (after abnormal nuc)     Social History   Social History  . Marital Status: Married    Spouse Name: N/A  . Number of Children: 2  . Years of Education: N/A   Occupational History  . safety director/transportation    Social History Main Topics  . Smoking status: Never  Smoker   . Smokeless tobacco: Former Systems developer    Types: [place] date: 02/03/2012  . Alcohol Use: No     Comment: past drinker 2-3 daily and sometimes binge drinks on the weekend  . Drug Use: No  . Sexual Activity: Not on file   Other Topics Concern  . Not on file   Social History Narrative    Past Surgical History  Procedure Laterality Date  . Knee surgery    . Coronary stent placement      drug eluting  . Inguinal hernia repair  01/14/2012    Procedure: LAPAROSCOPIC INGUINAL HERNIA;  Surgeon: Ralene Ok, MD;  Location: [facility];  Service: General;  Laterality: Right;  . Insertion of mesh  01/14/2012    Procedure: INSERTION OF MESH;  Surgeon: Ralene Ok, MD;  Location: [facility];  Service: General;  Laterality: Right;  . Hernia repair    . Left heart catheterization with coronary angiogram N/A 10/21/2013    Procedure: LEFT HEART CATHETERIZATION WITH CORONARY ANGIOGRAM;  Surgeon: Blane Ohara, MD;  Location: [facility] CATH LAB;  Service: Cardiovascular;  Laterality: N/A;    Family History  Problem Relation Age of Onset  . Lung cancer Mother   . Hypertension Mother   .  Emphysema Father   . Heart attack    . Colon cancer Neg Hx   . Diabetes Mother   . Hyperlipidemia Mother   . Heart failure Father   . Heart attack Neg Hx   . Stroke Neg Hx     No Known Allergies  Current Outpatient Prescriptions on File Prior to Visit  Medication Sig Dispense Refill  . ALPRAZolam (XANAX) 0.5 MG tablet TAKE 1 TABLET BY MOUTH 3 TIMES DAILY AS NEEDED 60 tablet 1  . aspirin 81 MG tablet Take 81 mg by mouth every morning.     . Azelaic Acid (FINACEA) 15 % cream Apply 1 application topically as directed. After skin is thoroughly washed and patted dry, gently but thoroughly massage a thin film of azelaic acid cream into the affected area twice daily, in the morning and evening. Applied to face.    . benazepril (LOTENSIN) 40 MG tablet TAKE 1 TABLET BY MOUTH EVERY DAY 90 tablet 1  .  clopidogrel (PLAVIX) 75 MG tablet TAKE 1 TABLET (75 MG TOTAL) BY MOUTH DAILY. 90 tablet 1  . cyclobenzaprine (FLEXERIL) 10 MG tablet Take 1 tablet (10 mg total) by mouth 3 (three) times daily as needed for muscle spasms. 30 tablet 2  . diclofenac sodium (VOLTAREN) 1 % GEL Apply 2 g topically 2 (two) times daily as needed. Apply to painful areas around knees    . metFORMIN (GLUMETZA) 500 MG (MOD) 24 hr tablet Take 2 tablets (1,000 mg total) by mouth daily with breakfast. 180 tablet 6  . Multiple Vitamin (MULTIVITAMIN WITH MINERALS) TABS Take 1 tablet by mouth daily.    . nitroGLYCERIN (NITROSTAT) 0.4 MG SL tablet Place 0.4 mg under the tongue every 5 (five) minutes as needed for chest pain.    . pantoprazole (PROTONIX) 20 MG tablet TAKE 2 TABLETS BY MOUTH EVERY DAY 180 tablet 3  . pravastatin (PRAVACHOL) 20 MG tablet TAKE 1 TABLET BY MOUTH EVERY DAY 90 tablet 1  . tadalafil (CIALIS) 20 MG tablet Take 20 mg by mouth daily as needed for erectile dysfunction.    . vitamin C (ASCORBIC ACID) 500 MG tablet Take 500 mg by mouth daily.     No current facility-administered medications on file prior to visit.    BP 130/80 mmHg  Pulse 76  Temp(Src) 98.1 F (36.7 C) (Oral)  Resp 20  Ht 5\' 6"  (1.676 m)  Wt 206 lb (93.441 kg)  BMI 33.27 kg/m2  SpO2 97%    Review of Systems  Constitutional: Negative for fever, chills, appetite change and fatigue.  HENT: Negative for congestion, dental problem, ear pain, hearing loss, sore throat, tinnitus, trouble swallowing and voice change.   Eyes: Negative for pain, discharge and visual disturbance.  Respiratory: Negative for cough, chest tightness, wheezing and stridor.   Cardiovascular: Negative for chest pain, palpitations and leg swelling.  Gastrointestinal: Negative for nausea, vomiting, abdominal pain, diarrhea, constipation, blood in stool and abdominal distention.  Genitourinary: Negative for urgency, hematuria, flank pain, discharge, difficulty urinating  and genital sores.  Musculoskeletal: Negative for myalgias, back pain, joint swelling, arthralgias, gait problem and neck stiffness.  Skin: Negative for rash.  Neurological: Negative for dizziness, syncope, speech difficulty, weakness, numbness and headaches.  Hematological: Negative for adenopathy. Does not bruise/bleed easily.  Psychiatric/Behavioral: Negative for behavioral problems and dysphoric mood. The patient is not nervous/anxious.        Objective:   Physical Exam  Constitutional: He is oriented to person, place, and time. He  appears well-developed.  Blood pressure 120/72  HENT:  Head: Normocephalic.  Right Ear: External ear normal.  Left Ear: External ear normal.  Eyes: Conjunctivae and EOM are normal.  Neck: Normal range of motion.  Cardiovascular: Normal rate and normal heart sounds.   Pulmonary/Chest: Breath sounds normal.  Abdominal: Bowel sounds are normal.  Musculoskeletal: Normal range of motion. He exhibits no edema or tenderness.  Neurological: He is alert and oriented to person, place, and time.  Psychiatric: He has a normal mood and affect. His behavior is normal.          Assessment & Plan:   Diabetes mellitus.  Will check a hemoglobin A1c Obesity.  Weight loss encouraged Essential hypertension, well-controlled Coronary artery disease, stable  Schedule CPX 3 months

## 2014-12-20 NOTE — Progress Notes (Signed)
Pre visit review using our clinic review tool, if applicable. No additional management support is needed unless otherwise documented below in the visit note. 

## 2015-01-06 ENCOUNTER — Other Ambulatory Visit: Payer: Self-pay | Admitting: Internal Medicine

## 2015-01-17 ENCOUNTER — Other Ambulatory Visit: Payer: Self-pay | Admitting: Internal Medicine

## 2015-02-25 ENCOUNTER — Other Ambulatory Visit: Payer: Self-pay | Admitting: Internal Medicine

## 2015-04-04 ENCOUNTER — Other Ambulatory Visit: Payer: Self-pay | Admitting: Internal Medicine

## 2015-04-26 ENCOUNTER — Encounter: Payer: 59 | Admitting: Internal Medicine

## 2015-05-29 ENCOUNTER — Other Ambulatory Visit: Payer: Self-pay | Admitting: Internal Medicine

## 2015-06-10 ENCOUNTER — Encounter: Payer: 59 | Admitting: Internal Medicine

## 2015-07-01 ENCOUNTER — Other Ambulatory Visit: Payer: Self-pay | Admitting: Internal Medicine

## 2015-07-14 ENCOUNTER — Other Ambulatory Visit: Payer: Self-pay | Admitting: Internal Medicine

## 2015-07-17 ENCOUNTER — Other Ambulatory Visit: Payer: Self-pay | Admitting: Internal Medicine

## 2015-07-20 ENCOUNTER — Other Ambulatory Visit (HOSPITAL_COMMUNITY): Payer: Self-pay | Admitting: Internal Medicine

## 2015-08-05 ENCOUNTER — Other Ambulatory Visit (INDEPENDENT_AMBULATORY_CARE_PROVIDER_SITE_OTHER): Payer: 59

## 2015-08-05 DIAGNOSIS — Z0000 Encounter for general adult medical examination without abnormal findings: Secondary | ICD-10-CM

## 2015-08-05 LAB — CBC WITH DIFFERENTIAL/PLATELET
BASOS ABS: 0 10*3/uL (ref 0.0–0.1)
Basophils Relative: 0.3 % (ref 0.0–3.0)
Eosinophils Absolute: 0.1 10*3/uL (ref 0.0–0.7)
Eosinophils Relative: 2.1 % (ref 0.0–5.0)
HCT: 41.7 % (ref 39.0–52.0)
HEMOGLOBIN: 13.8 g/dL (ref 13.0–17.0)
LYMPHS ABS: 2.1 10*3/uL (ref 0.7–4.0)
Lymphocytes Relative: 30.9 % (ref 12.0–46.0)
MCHC: 33.1 g/dL (ref 30.0–36.0)
MCV: 86.3 fl (ref 78.0–100.0)
MONO ABS: 0.7 10*3/uL (ref 0.1–1.0)
MONOS PCT: 10.4 % (ref 3.0–12.0)
NEUTROS PCT: 56.3 % (ref 43.0–77.0)
Neutro Abs: 3.7 10*3/uL (ref 1.4–7.7)
Platelets: 182 10*3/uL (ref 150.0–400.0)
RBC: 4.82 Mil/uL (ref 4.22–5.81)
RDW: 15.5 % (ref 11.5–15.5)
WBC: 6.7 10*3/uL (ref 4.0–10.5)

## 2015-08-05 LAB — LIPID PANEL
Cholesterol: 164 mg/dL (ref 0–200)
HDL: 38.3 mg/dL — ABNORMAL LOW (ref >39.00)
LDL CALC: 95 mg/dL (ref 0–99)
NONHDL: 125.63
Total CHOL/HDL Ratio: 4
Triglycerides: 154 mg/dL — ABNORMAL HIGH (ref 0.0–149.0)
VLDL: 30.8 mg/dL (ref 0.0–40.0)

## 2015-08-05 LAB — POC URINALSYSI DIPSTICK (AUTOMATED)
BILIRUBIN UA: NEGATIVE
Glucose, UA: NEGATIVE
KETONES UA: NEGATIVE
Leukocytes, UA: NEGATIVE
Nitrite, UA: NEGATIVE
PH UA: 5.5
PROTEIN UA: NEGATIVE
RBC UA: NEGATIVE
Spec Grav, UA: >=1.03
Urobilinogen, UA: 0.2

## 2015-08-05 LAB — TSH: TSH: 4.03 u[IU]/mL (ref 0.35–4.50)

## 2015-08-05 LAB — HEPATIC FUNCTION PANEL
ALK PHOS: 65 U/L (ref 39–117)
ALT: 52 U/L (ref 0–53)
AST: 38 U/L — AB (ref 0–37)
Albumin: 3.9 g/dL (ref 3.5–5.2)
BILIRUBIN TOTAL: 0.6 mg/dL (ref 0.2–1.2)
Bilirubin, Direct: 0.1 mg/dL (ref 0.0–0.3)
Total Protein: 6.4 g/dL (ref 6.0–8.3)

## 2015-08-05 LAB — BASIC METABOLIC PANEL
BUN: 13 mg/dL (ref 6–23)
CALCIUM: 9.3 mg/dL (ref 8.4–10.5)
CO2: 29 mEq/L (ref 19–32)
Chloride: 102 mEq/L (ref 96–112)
Creatinine, Ser: 0.91 mg/dL (ref 0.40–1.50)
GFR: 89.47 mL/min (ref >60.00)
GLUCOSE: 110 mg/dL — AB (ref 70–99)
Potassium: 3.9 mEq/L (ref 3.5–5.1)
SODIUM: 139 meq/L (ref 135–145)

## 2015-08-05 LAB — MICROALBUMIN / CREATININE URINE RATIO
Creatinine,U: 228.9 mg/dL
Microalb Creat Ratio: 0.3 mg/g (ref 0.0–30.0)
Microalb, Ur: 0.7 mg/dL (ref 0.0–1.9)

## 2015-08-05 LAB — HEMOGLOBIN A1C: Hgb A1c MFr Bld: 7.2 % — ABNORMAL HIGH (ref 4.6–6.5)

## 2015-08-05 LAB — PSA: PSA: 3.79 ng/mL (ref 0.10–4.00)

## 2015-08-10 ENCOUNTER — Telehealth: Payer: Self-pay | Admitting: Internal Medicine

## 2015-08-10 NOTE — Telephone Encounter (Signed)
Yes, that is fine. 

## 2015-08-10 NOTE — Telephone Encounter (Signed)
Pt call to say he has a funeral on Friday 08/12/15 and he has a physical scheduled at 2pm. He is asking to reschedule can we create something he need to have phy done by the end of the month

## 2015-08-11 NOTE — Telephone Encounter (Signed)
Pt scheduled  

## 2015-08-12 ENCOUNTER — Encounter: Payer: 59 | Admitting: Internal Medicine

## 2015-08-23 ENCOUNTER — Encounter: Payer: Self-pay | Admitting: Internal Medicine

## 2015-08-23 ENCOUNTER — Ambulatory Visit (INDEPENDENT_AMBULATORY_CARE_PROVIDER_SITE_OTHER): Payer: 59 | Admitting: Internal Medicine

## 2015-08-23 VITALS — BP 150/80 | HR 74 | Temp 98.0°F | Resp 20 | Ht 66.75 in | Wt 209.0 lb

## 2015-08-23 DIAGNOSIS — Z86010 Personal history of colonic polyps: Secondary | ICD-10-CM

## 2015-08-23 DIAGNOSIS — Z0000 Encounter for general adult medical examination without abnormal findings: Secondary | ICD-10-CM | POA: Diagnosis not present

## 2015-08-23 DIAGNOSIS — R972 Elevated prostate specific antigen [PSA]: Secondary | ICD-10-CM | POA: Diagnosis not present

## 2015-08-23 DIAGNOSIS — E119 Type 2 diabetes mellitus without complications: Secondary | ICD-10-CM | POA: Diagnosis not present

## 2015-08-23 DIAGNOSIS — R7989 Other specified abnormal findings of blood chemistry: Secondary | ICD-10-CM

## 2015-08-23 DIAGNOSIS — I2510 Atherosclerotic heart disease of native coronary artery without angina pectoris: Secondary | ICD-10-CM

## 2015-08-23 DIAGNOSIS — R946 Abnormal results of thyroid function studies: Secondary | ICD-10-CM

## 2015-08-23 DIAGNOSIS — I10 Essential (primary) hypertension: Secondary | ICD-10-CM

## 2015-08-23 DIAGNOSIS — E785 Hyperlipidemia, unspecified: Secondary | ICD-10-CM

## 2015-08-23 MED ORDER — METFORMIN HCL ER 500 MG PO TB24: ORAL_TABLET | ORAL | Status: DC

## 2015-08-23 NOTE — Patient Instructions (Signed)
Increase metformin to 1000 mg twice daily  Limit your sodium (Salt) intake  Cardiology follow-up as scheduled   Please check your hemoglobin A1c every 3-6  Months  Please check your blood pressure on a regular basis.  If it is consistently greater than 150/90, please make an office appointment.

## 2015-08-23 NOTE — Progress Notes (Signed)
Subjective:    Patient ID: Jose [name], male    DOB: 08-12-1950, 65 y.o.   MRN: [id number]  HPI  65 year old patient who is seen today for a preventive health examination. He has a history of type 2 diabetes.  This has been controlled on metformin therapy.  Most recent hemoglobin A1c elevated at 7.2. He has coronary artery disease and is scheduled to see cardiology in August.  Denies any exertional chest pain He has essential hypertension He has a prior history of alcohol abuse but has been abstinent since 2010.  Apparently he has been on PPI therapy since that time.  Denies any dyspepsia.  Protonix will be discontinued He has a history of osteoarthritis involving the knees and is status post partial left knee arthroplasty.  He has done well.  He has a history of colonic polyps.  Last colonoscopy 2014  Past Medical History  Diagnosis Date  . History of TIA (transient ischemic attack) may 2010  . HYPERTENSION 09/18/2006  . GERD 09/18/2006  . 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  . ANXIETY 07/27/2008  . ALCOHOL ABUSE 07/27/2008  . LOW BACK PAIN 09/18/2006  . Rosacea 07/27/2008  . Hx of adenomatous colonic polyps   . Bifascicular block     beta blocker stopped due to profound bradycardia  . Urinary tract infection start cipro 01-07-2012  . SEIZURE DISORDER 09/18/2006    none since 2008  . Numbness and tingling     finger tips at times  . Myocardial infarction ([place]) 2010  . Hx of cardiovascular stress test     ETT-Myoview (09/2013):  Partially fixed defect with some reversibility - cannot r/o inf ischemia vs diaph atten, EF 51%; mod risk  . CAD 07/27/2008    a. s/p Endeavor DES to CFX 07/2008; residual OM1 70%, EF 45%;  b. relook cath 5/10: patent stent in CFX;  c.  LHC (8/15):  prox LAD 30%, OM1 50% at bifurcation, OM1 sub-branch 50%, mid AVCFX stent patent, EF 55-65% - no change from 2010 >>> Med Rx (after abnormal nuc)      Social History   Social History  . Marital Status: Married    Spouse  Name: N/A  . Number of Children: 2  . Years of Education: N/A   Occupational History  . safety director/transportation    Social History Main Topics  . Smoking status: Never Smoker   . Smokeless tobacco: Former Systems developer    Types: [place] date: 02/03/2012  . Alcohol Use: No     Comment: past drinker 2-3 daily and sometimes binge drinks on the weekend  . Drug Use: No  . Sexual Activity: Not on file   Other Topics Concern  . Not on file   Social History Narrative    Past Surgical History  Procedure Laterality Date  . Knee surgery    . Coronary stent placement      drug eluting  . Inguinal hernia repair  01/14/2012    Procedure: LAPAROSCOPIC INGUINAL HERNIA;  Surgeon: Ralene Ok, MD;  Location: [facility];  Service: General;  Laterality: Right;  . Insertion of mesh  01/14/2012    Procedure: INSERTION OF MESH;  Surgeon: Ralene Ok, MD;  Location: [facility];  Service: General;  Laterality: Right;  . Hernia repair    . Left heart catheterization with coronary angiogram N/A 10/21/2013    Procedure: LEFT HEART CATHETERIZATION WITH CORONARY ANGIOGRAM;  Surgeon: Blane Ohara, MD;  Location[facility] CATH LAB;  Service:  Cardiovascular;  Laterality: N/A;    Family History  Problem Relation Age of Onset  . Lung cancer Mother   . Hypertension Mother   . Emphysema Father   . Heart attack    . Colon cancer Neg Hx   . Diabetes Mother   . Hyperlipidemia Mother   . Heart failure Father   . Heart attack Neg Hx   . Stroke Neg Hx     No Known Allergies  Current Outpatient Prescriptions on File Prior to Visit  Medication Sig Dispense Refill  . ALPRAZolam (XANAX) 0.5 MG tablet TAKE 1 TABLET BY MOUTH 3 TIMES DAILY AS NEEDED 60 tablet 1  . aspirin 81 MG tablet Take 81 mg by mouth every morning.     . Azelaic Acid (FINACEA) 15 % cream Apply 1 application topically as directed. After skin is thoroughly washed and patted dry, gently but thoroughly massage a thin film of azelaic acid cream  into the affected area twice daily, in the morning and evening. Applied to face.    . benazepril (LOTENSIN) 40 MG tablet TAKE 1 TABLET BY MOUTH EVERY DAY 90 tablet 0  . clopidogrel (PLAVIX) 75 MG tablet TAKE 1 TABLET (75 MG TOTAL) BY MOUTH DAILY. 90 tablet 1  . diclofenac sodium (VOLTAREN) 1 % GEL Apply 2 g topically 2 (two) times daily as needed. Apply to painful areas around knees    . Multiple Vitamin (MULTIVITAMIN WITH MINERALS) TABS Take 1 tablet by mouth daily.    Marland Kitchen NITROSTAT 0.4 MG SL tablet DISSOLVE 1 TABLET BY MOUTH UNDER THE TONGUE EVERY 5 MINUTES AS NEEDED FOR CHEST PAIN 25 tablet 1  . pravastatin (PRAVACHOL) 20 MG tablet TAKE 1 TABLET BY MOUTH EVERY DAY 90 tablet 1  . tadalafil (CIALIS) 20 MG tablet Take 20 mg by mouth daily as needed for erectile dysfunction.    . vitamin C (ASCORBIC ACID) 500 MG tablet Take 500 mg by mouth daily. Reported on 08/23/2015     No current facility-administered medications on file prior to visit.    BP 150/80 mmHg  Pulse 74  Temp(Src) 98 F (36.7 C) (Oral)  Resp 20  Ht 5' 6.75" (1.695 m)  Wt 209 lb (94.802 kg)  BMI 33.00 kg/m2  SpO2 98%     Review of Systems  Constitutional: Negative for fever, chills, appetite change and fatigue.  HENT: Negative for congestion, dental problem, ear pain, hearing loss, sore throat, tinnitus, trouble swallowing and voice change.   Eyes: Negative for pain, discharge and visual disturbance.  Respiratory: Negative for cough, chest tightness, wheezing and stridor.   Cardiovascular: Negative for chest pain, palpitations and leg swelling.  Gastrointestinal: Negative for nausea, vomiting, abdominal pain, diarrhea, constipation, blood in stool and abdominal distention.  Genitourinary: Negative for urgency, hematuria, flank pain, discharge, difficulty urinating and genital sores.  Musculoskeletal: Negative for myalgias, back pain, joint swelling, arthralgias, gait problem and neck stiffness.  Skin: Negative for rash.    Neurological: Negative for dizziness, syncope, speech difficulty, weakness, numbness and headaches.  Hematological: Negative for adenopathy. Does not bruise/bleed easily.  Psychiatric/Behavioral: Negative for behavioral problems and dysphoric mood. The patient is not nervous/anxious.        Objective:   Physical Exam  Constitutional: He is oriented to person, place, and time. He appears well-developed and well-nourished.   Weight 209 Blood pressure 140/80  HENT:  Head: Normocephalic and atraumatic.  Right Ear: External ear normal.  Left Ear: External ear normal.  Nose: Nose normal.  Mouth/Throat: Oropharynx is clear and moist.  Eyes: Conjunctivae and EOM are normal. Pupils are equal, round, and reactive to light. No scleral icterus.  Neck: Normal range of motion. Neck supple. No JVD present. No thyromegaly present.  Cardiovascular: Normal rate, regular rhythm, normal heart sounds and intact distal pulses.  Exam reveals no gallop and no friction rub.   No murmur heard. Pulmonary/Chest: Effort normal and breath sounds normal. He exhibits no tenderness.  Abdominal: Soft. Bowel sounds are normal. He exhibits no distension and no mass. There is no tenderness.  Genitourinary: Prostate normal and penis normal. Guaiac negative stool.  Mild symmetrical enlargement  Musculoskeletal: Normal range of motion. He exhibits no edema or tenderness.  Surgical scar left knee  Lymphadenopathy:    He has no cervical adenopathy.  Neurological: He is alert and oriented to person, place, and time. He has normal reflexes. No cranial nerve deficit. Coordination normal.  Skin: Skin is warm and dry. No rash noted.  Psychiatric: He has a normal mood and affect. His behavior is normal.          Assessment & Plan:  Preventive health examination Diabetes mellitus type 2.  Suboptimal control with hemoglobin A1c now 7.2.  Will increase metformin to 1 g twice daily.  Weight loss encouraged, as well as better  diet and more rigorous exercise program.  Recheck hemoglobin A1c in 3 months Essential hypertension.  Blood pressure high normal.  Follow-up cardiology in August.  We'll recheck here in 3 months Mild obesity weight loss encouraged Osteoarthritis of the knees History colonic polyps.  Follow-up colonoscopy 2019 Chronic PPI therapy.  Discontinued today  Recheck 3 months  Nyoka Cowden, MD

## 2015-08-23 NOTE — Progress Notes (Signed)
Pre visit review using our clinic review tool, if applicable. No additional management support is needed unless otherwise documented below in the visit note. 

## 2015-09-15 ENCOUNTER — Other Ambulatory Visit: Payer: Self-pay | Admitting: Internal Medicine

## 2015-09-15 MED ORDER — ACCU-CHEK MULTICLIX LANCETS MISC: Status: DC

## 2015-09-15 MED ORDER — GLUCOSE BLOOD VI STRP: ORAL_STRIP | Status: DC

## 2015-09-16 ENCOUNTER — Encounter: Payer: Self-pay | Admitting: Internal Medicine

## 2015-09-29 ENCOUNTER — Other Ambulatory Visit: Payer: Self-pay | Admitting: Internal Medicine

## 2015-10-05 ENCOUNTER — Ambulatory Visit (INDEPENDENT_AMBULATORY_CARE_PROVIDER_SITE_OTHER): Payer: 59 | Admitting: Cardiovascular Disease

## 2015-10-05 ENCOUNTER — Encounter: Payer: Self-pay | Admitting: Cardiovascular Disease

## 2015-10-05 VITALS — BP 146/80 | HR 67 | Ht 66.0 in | Wt 205.2 lb

## 2015-10-05 DIAGNOSIS — I2510 Atherosclerotic heart disease of native coronary artery without angina pectoris: Secondary | ICD-10-CM | POA: Diagnosis not present

## 2015-10-05 DIAGNOSIS — I10 Essential (primary) hypertension: Secondary | ICD-10-CM | POA: Diagnosis not present

## 2015-10-05 DIAGNOSIS — E785 Hyperlipidemia, unspecified: Secondary | ICD-10-CM

## 2015-10-05 NOTE — Progress Notes (Signed)
Cardiology Office Note Date:  10/05/2015   ID:  Jose [name], DOB 08/24/1950, MRN [id number]  PCP:  Jose Cowden, MD  Cardiologist:  Jose Mocha, MD    Chief Complaint  Patient presents with  . Follow-up    CAD    History of Present Illness: MD. Jose [name] is a 65 y.o. male who presents for follow-up of coronary artery disease. He underwent DES placement in the LCx in 2010. He had chest pain in 2015 and was evaluated in the ER. A Myoview scan showed moderate risk findings and cath was arranged. This demonstrated a patent stent site and moderate nonobstructive disease in the OM without change from previous study in 2010.  The patient was last seen in March 2016.  He is doing well from a cardiac perspective. Today, he denies symptoms of palpitations, chest pain, shortness of breath, orthopnea, PND, lower extremity edema, dizziness, or syncope.  Past Medical History:  Diagnosis Date  . ALCOHOL ABUSE 07/27/2008  . ANXIETY 07/27/2008  . Bifascicular block    beta blocker stopped due to profound bradycardia  . CAD 07/27/2008   a. s/p Endeavor DES to CFX 07/2008; residual OM1 70%, EF 45%;  b. relook cath 5/10: patent stent in CFX;  c.  LHC (8/15):  prox LAD 30%, OM1 50% at bifurcation, OM1 sub-branch 50%, mid AVCFX stent patent, EF 55-65% - no change from 2010 >>> Med Rx (after abnormal nuc)   . GERD 09/18/2006  . History of TIA (transient ischemic attack) may 2010  . Hx of adenomatous colonic polyps   . Hx of cardiovascular stress test    ETT-Myoview (09/2013):  Partially fixed defect with some reversibility - cannot r/o inf ischemia vs diaph atten, EF 51%; mod risk  . HYPERTENSION 09/18/2006  . LOW BACK PAIN 09/18/2006  . Myocardial infarction ([place]) 2010  . Numbness and tingling    finger tips at times  . Rosacea 07/27/2008  . SEIZURE DISORDER 09/18/2006   none since 2008  . 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  . Urinary tract infection start cipro 01-07-2012    Past Surgical History:   Procedure Laterality Date  . CORONARY STENT PLACEMENT     drug eluting  . HERNIA REPAIR    . INGUINAL HERNIA REPAIR  01/14/2012   Procedure: LAPAROSCOPIC INGUINAL HERNIA;  Surgeon: Ralene Ok, MD;  Location: [facility];  Service: General;  Laterality: Right;  . INSERTION OF MESH  01/14/2012   Procedure: INSERTION OF MESH;  Surgeon: Ralene Ok, MD;  Location: [facility];  Service: General;  Laterality: Right;  . KNEE SURGERY    . LEFT HEART CATHETERIZATION WITH CORONARY ANGIOGRAM N/A 10/21/2013   Procedure: LEFT HEART CATHETERIZATION WITH CORONARY ANGIOGRAM;  Surgeon: Blane Ohara, MD;  Location: [facility] CATH LAB;  Service: Cardiovascular;  Laterality: N/A;    Current Outpatient Prescriptions  Medication Sig Dispense Refill  . ALPRAZolam (XANAX) 0.5 MG tablet TAKE 1 TABLET BY MOUTH 3 TIMES DAILY AS NEEDED 60 tablet 1  . aspirin 81 MG tablet Take 81 mg by mouth every morning.     . Azelaic Acid (FINACEA) 15 % cream Apply 1 application topically as directed. After skin is thoroughly washed and patted dry, gently but thoroughly massage a thin film of azelaic acid cream into the affected area twice daily, in the morning and evening. Applied to face.    . benazepril (LOTENSIN) 40 MG tablet TAKE 1 TABLET BY MOUTH EVERY DAY 90 tablet 2  . diclofenac sodium (VOLTAREN)  1 % GEL Apply 2 g topically 2 (two) times daily as needed. Apply to painful areas around knees    . glucose blood (ACCU-CHEK AVIVA) test strip Check Blood Sugars Once Per Day. DX: E11.9 100 each 1  . Lancets (ACCU-CHEK MULTICLIX) lancets Check Blood Sugars Once Per Day. DX: E11.9 100 each 1  . metFORMIN (GLUCOPHAGE-XR) 500 MG 24 hr tablet Take 2,000 mg by mouth 2 (two) times daily.  2  . Multiple Vitamin (MULTIVITAMIN WITH MINERALS) TABS Take 1 tablet by mouth daily.    Marland Kitchen NITROSTAT 0.4 MG SL tablet DISSOLVE 1 TABLET BY MOUTH UNDER THE TONGUE EVERY 5 MINUTES AS NEEDED FOR CHEST PAIN 25 tablet 1  . pravastatin (PRAVACHOL) 20 MG tablet  TAKE 1 TABLET BY MOUTH EVERY DAY 90 tablet 1  . tadalafil (CIALIS) 20 MG tablet Take 20 mg by mouth daily as needed for erectile dysfunction.    . vitamin C (ASCORBIC ACID) 500 MG tablet Take 500 mg by mouth daily. Reported on 08/23/2015     No current facility-administered medications for this visit.     Allergies:   Review of patient's allergies indicates no known allergies.   Social History:  The patient  reports that he has never smoked. He quit smokeless tobacco use about 3 years ago. His smokeless tobacco use included Chew. He reports that he does not drink alcohol or use drugs.   Family History:  The patient's  family history includes Diabetes in his mother; Emphysema in his father; Heart failure in his father; Hyperlipidemia in his mother; Hypertension in his mother; Lung cancer in his mother.   ROS:  Please see the history of present illness.  All other systems are reviewed and negative.   PHYSICAL EXAM: VS:  BP (!) 146/80   Pulse 67   Ht 5\' 6"  (1.676 m)   Wt 205 lb 3.2 oz (93.1 kg)   BMI 33.12 kg/m  , BMI Body mass index is 33.12 kg/m. GEN: Well nourished, well developed, in no acute distress  HEENT: normal  Neck: no JVD, no masses. No carotid bruits Cardiac: RRR without murmur or gallop                Respiratory:  clear to auscultation bilaterally, normal work of breathing GI: soft, nontender, nondistended, + BS MS: no deformity or atrophy  Ext: no pretibial edema, pedal pulses 2+= bilaterally Skin: warm and dry, no rash Neuro:  Strength and sensation are intact Psych: euthymic mood, full affect  EKG:  EKG is ordered today. The ekg ordered today shows normal sinus rhythm 67 bpm, right bundle branch block, left anterior fascicular block. No significant change from previous tracing  Recent Labs: 08/05/2015: ALT 52; BUN 13; Creatinine, Ser 0.91; Hemoglobin 13.8; Platelets 182.0; Potassium 3.9; Sodium 139; TSH 4.03   Lipid Panel     Component Value Date/Time   CHOL  164 08/05/2015 1134   TRIG 154.0 (H) 08/05/2015 1134   HDL 38.30 (L) 08/05/2015 1134   CHOLHDL 4 08/05/2015 1134   VLDL 30.8 08/05/2015 1134   LDLCALC 95 08/05/2015 1134   LDLDIRECT 163.3 07/09/2007 0818      Wt Readings from Last 3 Encounters:  10/05/15 205 lb 3.2 oz (93.1 kg)  08/23/15 209 lb (94.8 kg)  12/20/14 206 lb (93.4 kg)     Cardiac Studies Reviewed: Myoview stress test 09/23/2013: Impression Exercise Capacity:  Good exercise capacity. BP Response:  Normal blood pressure response. Clinical Symptoms:  No symptoms. ECG  Impression:  No significant ST segment change suggestive of ischemia. Comparison with Prior Nuclear Study: No images to compare  Overall Impression:  Moderate risk stress nuclear study with a partially fixed defect in the inferior wall with some reversibility.  Cannot rule out inferior ischemia vs. variations in diaphragmatic attenuation as well as interfering gut uptake on rest images..  LV Ejection Fraction: 51%.  LV Wall Motion:  NL LV Function; NL Wall Motion  Cardiac catheterization 10/21/2013: Procedural Findings: Hemodynamics: AO 139/66 LV 138/5  Coronary angiography: Coronary dominance: Left  Left mainstem: The left main stem is widely patent with no obstructive disease. The vessel divides into the LAD and left circumflex.  Left anterior descending (LAD): The LAD is patent to the apex of the heart. The vessel has minor luminal irregularity. There is an eccentric plaque in the proximal LAD with about 30% associated stenosis. There is no other significant disease noted throughout the distribution of the LAD. The diagonal branches are patent.  Left circumflex (LCx): The left circumflex is a large, dominant vessel. The proximal circumflex is patent with minor narrowing at the ostium. The first obtuse marginal branch divides into twin vessels. At its bifurcation point, there is a 50% stenosis leading into the main branch. This is unchanged  from the 2010 study. The ostium of the subbranch also has 50% stenosis, unchanged from the previous study. The AV circumflex in the midportion is stented. There is no significant in-stent restenosis. This leads into a left PDA branch.  Right coronary artery (RCA): The right coronary artery is medium in caliber. The vessel supplies 2 RV marginal branches without significant stenosis.  Left ventriculography: Left ventricular systolic function is normal, LVEF is estimated at 55-65%, there is no significant mitral regurgitation   Contrast: 90 mL of Omnipaque  Radiation dose/Fluoro time: 3.2 minutes  Estimated Blood Loss: Minimal  Final Conclusions:   1. Single-vessel coronary artery disease with continued patency of the stented segment in the mid left circumflex and mild to moderate stenosis of the first obtuse marginal branch 2. Mild nonobstructive LAD stenosis 3. Small, nondominant RCA 4. Normal LV systolic function  Recommendations: The patient's films were compared to his previous study from 2010. There is no change in coronary anatomy and there clearly is stability without any significant progression of obstructive CAD. Recommend ongoing risk reduction measures.  ASSESSMENT AND PLAN: 1.  CAD, native vessel, without symptoms of angina: The patient is doing well on his current medical program. We reviewed the risks and benefits of extended dual antiplatelet therapy with aspirin and Plavix. The patient was treated with a drug-eluting stent now 7 years ago. His stent is been patent on relook catheterization. I recommended that he stop Plavix and continue on aspirin 81 mg indefinitely.  2. Essential hypertension: Blood pressure is well controlled on benazepril. AV nodal blockers should be avoided with his bifascicular block. He reports home readings in the 120s over 70s.  3. Hyperlipidemia: Treated with pravastatin. Most recent lipids reviewed.  Current medicines are reviewed with the  patient today.  The patient does not have concerns regarding medicines.  Labs/ tests ordered today include:   Orders Placed This Encounter  Procedures  . EKG 12-Lead    Disposition:   FU one year  Signed, Jose Mocha, MD  10/05/2015 5:17 PM    [facility] [address], [place], [place]  [address] Phone: [phone]; Fax: [phone]

## 2015-10-05 NOTE — Patient Instructions (Signed)
Medication Instructions:  Your physician has recommended you make the following change in your medication:  1. STOP Plavix (clopidogrel)  Labwork: No new orders.   Testing/Procedures: No new ordersl  Follow-Up: Your physician wants you to follow-up in: 1 YEAR with Dr Burt Knack.  You will receive a reminder letter in the mail two months in advance. If you don't receive a letter, please call our office to schedule the follow-up appointment.   Any Other Special Instructions Will Be Listed Below (If Applicable).     If you need a refill on your cardiac medications before your next appointment, please call your pharmacy.

## 2015-10-14 ENCOUNTER — Encounter: Payer: Self-pay | Admitting: Internal Medicine

## 2015-10-14 ENCOUNTER — Other Ambulatory Visit: Payer: Self-pay | Admitting: Internal Medicine

## 2015-11-22 ENCOUNTER — Ambulatory Visit: Payer: 59 | Admitting: Internal Medicine

## 2015-11-25 ENCOUNTER — Other Ambulatory Visit: Payer: Self-pay | Admitting: Internal Medicine

## 2015-11-29 ENCOUNTER — Ambulatory Visit (INDEPENDENT_AMBULATORY_CARE_PROVIDER_SITE_OTHER): Payer: 59 | Admitting: Internal Medicine

## 2015-11-29 ENCOUNTER — Encounter: Payer: Self-pay | Admitting: Internal Medicine

## 2015-11-29 VITALS — BP 132/80 | HR 74 | Temp 98.1°F | Resp 20 | Ht 66.0 in | Wt 200.4 lb

## 2015-11-29 DIAGNOSIS — I10 Essential (primary) hypertension: Secondary | ICD-10-CM | POA: Diagnosis not present

## 2015-11-29 DIAGNOSIS — I2510 Atherosclerotic heart disease of native coronary artery without angina pectoris: Secondary | ICD-10-CM

## 2015-11-29 DIAGNOSIS — E785 Hyperlipidemia, unspecified: Secondary | ICD-10-CM

## 2015-11-29 DIAGNOSIS — E1151 Type 2 diabetes mellitus with diabetic peripheral angiopathy without gangrene: Secondary | ICD-10-CM

## 2015-11-29 MED ORDER — GLUCOSE BLOOD VI STRP: ORAL_STRIP | 1 refills | Status: DC

## 2015-11-29 MED ORDER — ACCU-CHEK MULTICLIX LANCETS MISC: 1 refills | Status: DC

## 2015-11-29 NOTE — Patient Instructions (Signed)
Limit your sodium (Salt) intake   Please check your hemoglobin A1c every 3 months    It is important that you exercise regularly, at least 20 minutes 3 to 4 times per week.  If you develop chest pain or shortness of breath seek  medical attention.   

## 2015-11-29 NOTE — Progress Notes (Signed)
Pre visit review using our clinic review tool, if applicable. No additional management support is needed unless otherwise documented below in the visit note. 

## 2015-11-29 NOTE — Progress Notes (Signed)
Subjective:    Patient ID: Jose [name], male    DOB: 05-01-1950, 65 y.o.   MRN: [id number]  HPI 65 year old patient who has type 2 diabetes.  He has seen cardiology recently for follow-up of coronary artery disease.  He is doing well.  He remains on aspirin and statin therapy.  He has treated hypertension.  His last hemoglobin A1c was slightly elevated.  Over the past few months he has lost about 9 pounds in weight.  Blood sugars seem to be doing very well.  No cardiopulmonary complaints  Past Medical History:  Diagnosis Date  . ALCOHOL ABUSE 07/27/2008  . ANXIETY 07/27/2008  . Bifascicular block    beta blocker stopped due to profound bradycardia  . CAD 07/27/2008   a. s/p Endeavor DES to CFX 07/2008; residual OM1 70%, EF 45%;  b. relook cath 5/10: patent stent in CFX;  c.  LHC (8/15):  prox LAD 30%, OM1 50% at bifurcation, OM1 sub-branch 50%, mid AVCFX stent patent, EF 55-65% - no change from 2010 >>> Med Rx (after abnormal nuc)   . GERD 09/18/2006  . History of TIA (transient ischemic attack) may 2010  . Hx of adenomatous colonic polyps   . Hx of cardiovascular stress test    ETT-Myoview (09/2013):  Partially fixed defect with some reversibility - cannot r/o inf ischemia vs diaph atten, EF 51%; mod risk  . HYPERTENSION 09/18/2006  . LOW BACK PAIN 09/18/2006  . Myocardial infarction ([place]) 2010  . Numbness and tingling    finger tips at times  . Rosacea 07/27/2008  . SEIZURE DISORDER 09/18/2006   none since 2008  . 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  . Urinary tract infection start cipro 01-07-2012     Social History   Social History  . Marital status: Married    Spouse name: N/A  . Number of children: 2  . Years of education: N/A   Occupational History  . 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 Smoking status: Never Smoker  . Smokeless tobacco: Former Systems developer    Types: [place] date: 02/03/2012  . Alcohol use No     Comment: past  drinker 2-3 daily and sometimes binge drinks on the weekend  . Drug use: No  . Sexual activity: Not on file   Other Topics Concern  . Not on file   Social History Narrative  . No narrative on file    Past Surgical History:  Procedure Laterality Date  . CORONARY STENT PLACEMENT     drug eluting  . HERNIA REPAIR    . INGUINAL HERNIA REPAIR  01/14/2012   Procedure: LAPAROSCOPIC INGUINAL HERNIA;  Surgeon: Ralene Ok, MD;  Location: [facility];  Service: General;  Laterality: Right;  . INSERTION OF MESH  01/14/2012   Procedure: INSERTION OF MESH;  Surgeon: Ralene Ok, MD;  Location: [facility];  Service: General;  Laterality: Right;  . KNEE SURGERY    . LEFT HEART CATHETERIZATION WITH CORONARY ANGIOGRAM N/A 10/21/2013   Procedure: LEFT HEART CATHETERIZATION WITH CORONARY ANGIOGRAM;  Surgeon: Blane Ohara, MD;  Location: [facility] CATH LAB;  Service: Cardiovascular;  Laterality: N/A;    Family History  Problem Relation Age of Onset  . Lung cancer Mother   . Hypertension Mother   . Diabetes Mother   . Hyperlipidemia Mother   . Emphysema Father   . Heart failure Father   . Heart attack    . Colon cancer  Neg Hx   . Stroke Neg Hx     No Known Allergies  Current Outpatient Prescriptions on File Prior to Visit  Medication Sig Dispense Refill  . ALPRAZolam (XANAX) 0.5 MG tablet TAKE 1 TABLET BY MOUTH 3 TIMES DAILY AS NEEDED 60 tablet 0  . aspirin 81 MG tablet Take 81 mg by mouth every morning.     . Azelaic Acid (FINACEA) 15 % cream Apply 1 application topically as directed. After skin is thoroughly washed and patted dry, gently but thoroughly massage a thin film of azelaic acid cream into the affected area twice daily, in the morning and evening. Applied to face.    . benazepril (LOTENSIN) 40 MG tablet TAKE 1 TABLET BY MOUTH EVERY DAY 90 tablet 2  . diclofenac sodium (VOLTAREN) 1 % GEL Apply 2 g topically 2 (two) times daily as needed. Apply to painful areas around knees    . glucose  blood (ACCU-CHEK AVIVA) test strip Check Blood Sugars Once Per Day. DX: E11.9 100 each 1  . Lancets (ACCU-CHEK MULTICLIX) lancets Check Blood Sugars Once Per Day. DX: E11.9 100 each 1  . metFORMIN (GLUCOPHAGE-XR) 500 MG 24 hr tablet Take 2,000 mg by mouth 2 (two) times daily.  2  . Multiple Vitamin (MULTIVITAMIN WITH MINERALS) TABS Take 1 tablet by mouth daily.    Marland Kitchen NITROSTAT 0.4 MG SL tablet DISSOLVE 1 TABLET BY MOUTH UNDER THE TONGUE EVERY 5 MINUTES AS NEEDED FOR CHEST PAIN 25 tablet 1  . pravastatin (PRAVACHOL) 20 MG tablet TAKE 1 TABLET BY MOUTH EVERY DAY 90 tablet 1  . vitamin C (ASCORBIC ACID) 500 MG tablet Take 500 mg by mouth daily. Reported on 08/23/2015     No current facility-administered medications on file prior to visit.     BP 132/80 (BP Location: Left Arm, Patient Position: Sitting, Cuff Size: Normal)   Pulse 74   Temp 98.1 F (36.7 C) (Oral)   Resp 20   Ht 5\' 6"  (1.676 m)   Wt 200 lb 6.1 oz (90.9 kg)   SpO2 98%   BMI 32.34 kg/m   \   Review of Systems  Constitutional: Negative for appetite change, chills, fatigue and fever.  HENT: Negative for congestion, dental problem, ear pain, hearing loss, sore throat, tinnitus, trouble swallowing and voice change.   Eyes: Negative for pain, discharge and visual disturbance.  Respiratory: Negative for cough, chest tightness, wheezing and stridor.   Cardiovascular: Negative for chest pain, palpitations and leg swelling.  Gastrointestinal: Negative for abdominal distention, abdominal pain, blood in stool, constipation, diarrhea, nausea and vomiting.  Genitourinary: Negative for difficulty urinating, discharge, flank pain, genital sores, hematuria and urgency.  Musculoskeletal: Negative for arthralgias, back pain, gait problem, joint swelling, myalgias and neck stiffness.  Skin: Negative for rash.  Neurological: Negative for dizziness, syncope, speech difficulty, weakness, numbness and headaches.  Hematological: Negative for  adenopathy. Does not bruise/bleed easily.  Psychiatric/Behavioral: Negative for behavioral problems and dysphoric mood. The patient is not nervous/anxious.        Objective:   Physical Exam  Constitutional: He is oriented to person, place, and time. He appears well-developed.  HENT:  Head: Normocephalic.  Right Ear: External ear normal.  Left Ear: External ear normal.  Eyes: Conjunctivae and EOM are normal.  Neck: Normal range of motion.  Cardiovascular: Normal rate, normal heart sounds and intact distal pulses.   Pulmonary/Chest: Breath sounds normal.  Abdominal: Bowel sounds are normal.  Musculoskeletal: Normal range of motion. He  exhibits no edema or tenderness.  Neurological: He is alert and oriented to person, place, and time.  Psychiatric: He has a normal mood and affect. His behavior is normal.          Assessment & Plan:   Diabetes mellitus type 2.  Check hemoglobin A1c Essential hypertension, stable Coronary artery disease, stable  Follow-up 3 months Lifestyle issues discussed  Nyoka Cowden

## 2016-01-04 ENCOUNTER — Other Ambulatory Visit: Payer: Self-pay | Admitting: Internal Medicine

## 2016-01-27 ENCOUNTER — Other Ambulatory Visit: Payer: Self-pay | Admitting: Internal Medicine

## 2016-02-21 ENCOUNTER — Encounter: Payer: Self-pay | Admitting: Internal Medicine

## 2016-02-21 ENCOUNTER — Ambulatory Visit (INDEPENDENT_AMBULATORY_CARE_PROVIDER_SITE_OTHER): Payer: 59 | Admitting: Internal Medicine

## 2016-02-21 VITALS — BP 148/76 | HR 79 | Temp 98.1°F | Ht 66.0 in | Wt 201.0 lb

## 2016-02-21 DIAGNOSIS — I10 Essential (primary) hypertension: Secondary | ICD-10-CM | POA: Diagnosis not present

## 2016-02-21 DIAGNOSIS — E785 Hyperlipidemia, unspecified: Secondary | ICD-10-CM

## 2016-02-21 DIAGNOSIS — E1151 Type 2 diabetes mellitus with diabetic peripheral angiopathy without gangrene: Secondary | ICD-10-CM | POA: Diagnosis not present

## 2016-02-21 LAB — HEMOGLOBIN A1C: HEMOGLOBIN A1C: 6.2 % (ref 4.6–6.5)

## 2016-02-21 NOTE — Progress Notes (Signed)
Subjective:    Patient ID: Jose [name], male    DOB: [birth year], 65 y.o.   MRN: [id number]  HPI  65 year old patient who is seen today for follow-up.  He is followed by cardiology with coronary artery disease.  He has type 2 diabetes which has been managed with metformin therapy only.  Last hemoglobin A1c was slightly elevated at 7.2  Wt Readings from Last 3 Encounters:  02/21/16 201 lb (91.2 kg)  11/29/15 200 lb 6.1 oz (90.9 kg)  10/05/15 205 lb 3.2 oz (93.1 kg)   No concerns or complaints.  He does monitor her pressure readings at home closely with nice readings.  Blood sugars are well controlled.  Fasting  Past Medical History:  Diagnosis Date  . ALCOHOL ABUSE 07/27/2008  . ANXIETY 07/27/2008  . Bifascicular block    beta blocker stopped due to profound bradycardia  . CAD 07/27/2008   a. s/p Endeavor DES to CFX 07/2008; residual OM1 70%, EF 45%;  b. relook cath 5/10: patent stent in CFX;  c.  LHC (8/15):  prox LAD 30%, OM1 50% at bifurcation, OM1 sub-branch 50%, mid AVCFX stent patent, EF 55-65% - no change from 2010 >>> Med Rx (after abnormal nuc)   . GERD 09/18/2006  . History of TIA (transient ischemic attack) may 2010  . Hx of adenomatous colonic polyps   . Hx of cardiovascular stress test    ETT-Myoview (09/2013):  Partially fixed defect with some reversibility - cannot r/o inf ischemia vs diaph atten, EF 51%; mod risk  . HYPERTENSION 09/18/2006  . LOW BACK PAIN 09/18/2006  . Myocardial infarction 2010  . Numbness and tingling    finger tips at times  . Rosacea 07/27/2008  . SEIZURE DISORDER 09/18/2006   none since 2008  . 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  . Urinary tract infection start cipro 01-07-2012     Social History   Social History  . Marital status: Married    Spouse name: N/A  . Number of children: 2  . Years of education: N/A   Occupational History  . 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 Smoking status: Never  Smoker  . Smokeless tobacco: Former Systems developer    Types: [place] date: 02/03/2012  . Alcohol use No     Comment: past drinker 2-3 daily and sometimes binge drinks on the weekend  . Drug use: No  . Sexual activity: Not on file   Other Topics Concern  . Not on file   Social History Narrative  . No narrative on file    Past Surgical History:  Procedure Laterality Date  . CORONARY STENT PLACEMENT     drug eluting  . HERNIA REPAIR    . INGUINAL HERNIA REPAIR  01/14/2012   Procedure: LAPAROSCOPIC INGUINAL HERNIA;  Surgeon: Ralene Ok, MD;  Location: [facility];  Service: General;  Laterality: Right;  . INSERTION OF MESH  01/14/2012   Procedure: INSERTION OF MESH;  Surgeon: Ralene Ok, MD;  Location: [facility];  Service: General;  Laterality: Right;  . KNEE SURGERY    . LEFT HEART CATHETERIZATION WITH CORONARY ANGIOGRAM N/A 10/21/2013   Procedure: LEFT HEART CATHETERIZATION WITH CORONARY ANGIOGRAM;  Surgeon: Blane Ohara, MD;  Location: [facility] CATH LAB;  Service: Cardiovascular;  Laterality: N/A;    Family History  Problem Relation Age of Onset  . Lung cancer Mother   . Hypertension Mother   . Diabetes Mother   .  Hyperlipidemia Mother   . Emphysema Father   . Heart failure Father   . Heart attack    . Colon cancer Neg Hx   . Stroke Neg Hx     No Known Allergies  Current Outpatient Prescriptions on File Prior to Visit  Medication Sig Dispense Refill  . ACCU-CHEK AVIVA PLUS test strip CHECK BLOOD SUGARS ONCE PER DAY. DX: E11.9 100 each 5  . ALPRAZolam (XANAX) 0.5 MG tablet TAKE 1 TABLET BY MOUTH 3 TIMES DAILY AS NEEDED 60 tablet 2  . aspirin 81 MG tablet Take 81 mg by mouth every morning.     . Azelaic Acid (FINACEA) 15 % cream Apply 1 application topically as directed. After skin is thoroughly washed and patted dry, gently but thoroughly massage a thin film of azelaic acid cream into the affected area twice daily, in the morning and evening. Applied to face.    . benazepril  (LOTENSIN) 40 MG tablet TAKE 1 TABLET BY MOUTH EVERY DAY 90 tablet 2  . diclofenac sodium (VOLTAREN) 1 % GEL Apply 2 g topically 2 (two) times daily as needed. Apply to painful areas around knees    . glucose blood (ACCU-CHEK AVIVA) test strip Check Blood Sugars Once Per Day. DX: E11.9 100 each 1  . Lancets (ACCU-CHEK MULTICLIX) lancets Check Blood Sugars Once Per Day. DX: E11.9 100 each 1  . metFORMIN (GLUCOPHAGE-XR) 500 MG 24 hr tablet Take 2,000 mg by mouth 2 (two) times daily.  2  . Multiple Vitamin (MULTIVITAMIN WITH MINERALS) TABS Take 1 tablet by mouth daily.    Marland Kitchen NITROSTAT 0.4 MG SL tablet DISSOLVE 1 TABLET BY MOUTH UNDER THE TONGUE EVERY 5 MINUTES AS NEEDED FOR CHEST PAIN 25 tablet 1  . pravastatin (PRAVACHOL) 20 MG tablet TAKE 1 TABLET BY MOUTH EVERY DAY 90 tablet 1  . vitamin C (ASCORBIC ACID) 500 MG tablet Take 500 mg by mouth daily. Reported on 08/23/2015     No current facility-administered medications on file prior to visit.     BP (!) 148/76 (BP Location: Right Arm, Patient Position: Sitting, Cuff Size: Normal)   Pulse 79   Temp 98.1 F (36.7 C) (Oral)   Ht 5\' 6"  (1.676 m)   Wt 201 lb (91.2 kg)   SpO2 98%   BMI 32.44 kg/m     Review of Systems  Constitutional: Negative for appetite change, chills, fatigue and fever.  HENT: Negative for congestion, dental problem, ear pain, hearing loss, sore throat, tinnitus, trouble swallowing and voice change.   Eyes: Negative for pain, discharge and visual disturbance.  Respiratory: Negative for cough, chest tightness, wheezing and stridor.   Cardiovascular: Negative for chest pain, palpitations and leg swelling.  Gastrointestinal: Negative for abdominal distention, abdominal pain, blood in stool, constipation, diarrhea, nausea and vomiting.  Genitourinary: Negative for difficulty urinating, discharge, flank pain, genital sores, hematuria and urgency.  Musculoskeletal: Negative for arthralgias, back pain, gait problem, joint  swelling, myalgias and neck stiffness.  Skin: Negative for rash.  Neurological: Negative for dizziness, syncope, speech difficulty, weakness, numbness and headaches.  Hematological: Negative for adenopathy. Does not bruise/bleed easily.  Psychiatric/Behavioral: Negative for behavioral problems and dysphoric mood. The patient is not nervous/anxious.        Objective:   Physical Exam  Constitutional: He is oriented to person, place, and time. He appears well-developed.  Blood pressure 140 over 78  HENT:  Head: Normocephalic.  Right Ear: External ear normal.  Left Ear: External ear normal.  Eyes: Conjunctivae and EOM are normal.  Neck: Normal range of motion.  Cardiovascular: Normal rate and normal heart sounds.   Pulmonary/Chest: Breath sounds normal.  Abdominal: Bowel sounds are normal.  Musculoskeletal: Normal range of motion. He exhibits no edema or tenderness.  Neurological: He is alert and oriented to person, place, and time.  Psychiatric: He has a normal mood and affect. His behavior is normal.          Assessment & Plan:   Diabetes mellitus type II.  Will check a hemoglobin A1c.  Essential hypertension.  No change in therapy  Dyslipidemia.  Continue statin therapy  Follow-up 4 months  [email or web address],PETER Pilar Plate

## 2016-02-21 NOTE — Patient Instructions (Signed)
Limit your sodium (Salt) intake    It is important that you exercise regularly, at least 20 minutes 3 to 4 times per week.  If you develop chest pain or shortness of breath seek  medical attention.   Please check your hemoglobin A1c every 3 months   

## 2016-02-21 NOTE — Progress Notes (Signed)
Pre visit review using our clinic review tool, if applicable. No additional management support is needed unless otherwise documented below in the visit note. 

## 2016-03-28 ENCOUNTER — Other Ambulatory Visit: Payer: Self-pay | Admitting: Internal Medicine

## 2016-04-01 ENCOUNTER — Other Ambulatory Visit: Payer: Self-pay | Admitting: Internal Medicine

## 2016-05-16 ENCOUNTER — Other Ambulatory Visit: Payer: Self-pay | Admitting: Internal Medicine

## 2016-06-19 ENCOUNTER — Ambulatory Visit: Payer: 59 | Admitting: Internal Medicine

## 2016-06-26 ENCOUNTER — Encounter: Payer: Self-pay | Admitting: Internal Medicine

## 2016-06-26 ENCOUNTER — Ambulatory Visit (INDEPENDENT_AMBULATORY_CARE_PROVIDER_SITE_OTHER): Payer: 59 | Admitting: Internal Medicine

## 2016-06-26 VITALS — BP 132/70 | HR 75 | Temp 98.1°F | Ht 66.0 in | Wt 198.4 lb

## 2016-06-26 DIAGNOSIS — E785 Hyperlipidemia, unspecified: Secondary | ICD-10-CM | POA: Diagnosis not present

## 2016-06-26 DIAGNOSIS — E1151 Type 2 diabetes mellitus with diabetic peripheral angiopathy without gangrene: Secondary | ICD-10-CM

## 2016-06-26 DIAGNOSIS — I10 Essential (primary) hypertension: Secondary | ICD-10-CM

## 2016-06-26 LAB — HEMOGLOBIN A1C: HEMOGLOBIN A1C: 6.3 % (ref 4.6–6.5)

## 2016-06-26 NOTE — Progress Notes (Signed)
Pre visit review using our clinic review tool, if applicable. No additional management support is needed unless otherwise documented below in the visit note. 

## 2016-06-26 NOTE — Progress Notes (Signed)
Subjective:    Patient ID: Jose [name], male    DOB: 11-Jun-1950, 66 y.o.   MRN: [id number]  HPI  Lab Results  Component Value Date   HGBA1C 6.2 02/21/2016    Wt Readings from Last 3 Encounters:  06/26/16 198 lb 6.4 oz (90 kg)  02/21/16 201 lb (91.2 kg)  11/29/15 200 lb 6.1 oz (90.9 kg)    BP Readings from Last 3 Encounters:  06/26/16 132/70  02/21/16 (!) 148/76  11/29/15 82/40   66 year old patient who is seen today for follow-up of type 2 diabetes.  He is doing quite well and has enjoyed excellent lisinopril control.  Fasting blood sugars are often less than 100. No concerns or complaints. Did have an eye examination last year  Past Medical History:  Diagnosis Date  . ALCOHOL ABUSE 07/27/2008  . ANXIETY 07/27/2008  . Bifascicular block    beta blocker stopped due to profound bradycardia  . CAD 07/27/2008   a. s/p Endeavor DES to CFX 07/2008; residual OM1 70%, EF 45%;  b. relook cath 5/10: patent stent in CFX;  c.  LHC (8/15):  prox LAD 30%, OM1 50% at bifurcation, OM1 sub-branch 50%, mid AVCFX stent patent, EF 55-65% - no change from 2010 >>> Med Rx (after abnormal nuc)   . GERD 09/18/2006  . History of TIA (transient ischemic attack) may 2010  . Hx of adenomatous colonic polyps   . Hx of cardiovascular stress test    ETT-Myoview (09/2013):  Partially fixed defect with some reversibility - cannot r/o inf ischemia vs diaph atten, EF 51%; mod risk  . HYPERTENSION 09/18/2006  . LOW BACK PAIN 09/18/2006  . Myocardial infarction ([place]) 2010  . Numbness and tingling    finger tips at times  . Rosacea 07/27/2008  . SEIZURE DISORDER 09/18/2006   none since 2008  . 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  . Urinary tract infection start cipro 01-07-2012     Social History   Social History  . Marital status: Married    Spouse name: N/A  . Number of children: 2  . Years of education: N/A   Occupational History  . 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 Smoking status: Never Smoker  . Smokeless tobacco: Former Systems developer    Types: [place] date: 02/03/2012  . Alcohol use No     Comment: past drinker 2-3 daily and sometimes binge drinks on the weekend  . Drug use: No  . Sexual activity: Not on file   Other Topics Concern  . Not on file   Social History Narrative  . No narrative on file    Past Surgical History:  Procedure Laterality Date  . CORONARY STENT PLACEMENT     drug eluting  . HERNIA REPAIR    . INGUINAL HERNIA REPAIR  01/14/2012   Procedure: LAPAROSCOPIC INGUINAL HERNIA;  Surgeon: Ralene Ok, MD;  Location: [facility];  Service: General;  Laterality: Right;  . INSERTION OF MESH  01/14/2012   Procedure: INSERTION OF MESH;  Surgeon: Ralene Ok, MD;  Location: [facility];  Service: General;  Laterality: Right;  . KNEE SURGERY    . LEFT HEART CATHETERIZATION WITH CORONARY ANGIOGRAM N/A 10/21/2013   Procedure: LEFT HEART CATHETERIZATION WITH CORONARY ANGIOGRAM;  Surgeon: Blane Ohara, MD;  Location[facility] C[facility] LAB;  Service: Cardiovascular;  Laterality: N/A;    Family History  Problem Relation Age of Onset  . Lung cancer Mother   .  Hypertension Mother   . Diabetes Mother   . Hyperlipidemia Mother   . Emphysema Father   . Heart failure Father   . Heart attack    . Colon cancer Neg Hx   . Stroke Neg Hx     No Known Allergies  Current Outpatient Prescriptions on File Prior to Visit  Medication Sig Dispense Refill  . ACCU-CHEK AVIVA PLUS test strip CHECK BLOOD SUGARS ONCE PER DAY. DX: E11.9 100 each 5  . ALPRAZolam (XANAX) 0.5 MG tablet TAKE 1 TABLET BY MOUTH 3 TIMES DAILY AS NEEDED 60 tablet 2  . aspirin 81 MG tablet Take 81 mg by mouth every morning.     . Azelaic Acid (FINACEA) 15 % cream Apply 1 application topically as directed. After skin is thoroughly washed and patted dry, gently but thoroughly massage a thin film of azelaic acid cream into the affected area twice daily, in the morning and  evening. Applied to face.    . benazepril (LOTENSIN) 40 MG tablet TAKE 1 TABLET BY MOUTH EVERY DAY 90 tablet 2  . diclofenac sodium (VOLTAREN) 1 % GEL Apply 2 g topically 2 (two) times daily as needed. Apply to painful areas around knees    . glucose blood (ACCU-CHEK AVIVA) test strip Check Blood Sugars Once Per Day. DX: E11.9 100 each 1  . Lancets (ACCU-CHEK MULTICLIX) lancets Check Blood Sugars Once Per Day. DX: E11.9 100 each 1  . metFORMIN (GLUCOPHAGE-XR) 500 MG 24 hr tablet TAKE 2 TABLETS BY MOUTH TWICE A DAY 360 tablet 2  . Multiple Vitamin (MULTIVITAMIN WITH MINERALS) TABS Take 1 tablet by mouth daily.    Marland Kitchen NITROSTAT 0.4 MG SL tablet DISSOLVE 1 TABLET BY MOUTH UNDER THE TONGUE EVERY 5 MINUTES AS NEEDED FOR CHEST PAIN 25 tablet 1  . pravastatin (PRAVACHOL) 20 MG tablet TAKE 1 TABLET BY MOUTH EVERY DAY 90 tablet 1  . vitamin C (ASCORBIC ACID) 500 MG tablet Take 500 mg by mouth daily. Reported on 08/23/2015     No current facility-administered medications on file prior to visit.     BP 132/70 (BP Location: Left Arm, Patient Position: Sitting, Cuff Size: Normal)   Pulse 75   Temp 98.1 F (36.7 C) (Oral)   Ht 5\' 6"  (1.676 m)   Wt 198 lb 6.4 oz (90 kg)   SpO2 98%   BMI 32.02 kg/m    Review of Systems  Constitutional: Negative for appetite change, chills, fatigue and fever.  HENT: Negative for congestion, dental problem, ear pain, hearing loss, sore throat, tinnitus, trouble swallowing and voice change.   Eyes: Negative for pain, discharge and visual disturbance.  Respiratory: Negative for cough, chest tightness, wheezing and stridor.   Cardiovascular: Negative for chest pain, palpitations and leg swelling.  Gastrointestinal: Negative for abdominal distention, abdominal pain, blood in stool, constipation, diarrhea, nausea and vomiting.  Genitourinary: Negative for difficulty urinating, discharge, flank pain, genital sores, hematuria and urgency.  Musculoskeletal: Negative for  arthralgias, back pain, gait problem, joint swelling, myalgias and neck stiffness.  Skin: Negative for rash.  Neurological: Negative for dizziness, syncope, speech difficulty, weakness, numbness and headaches.  Hematological: Negative for adenopathy. Does not bruise/bleed easily.  Psychiatric/Behavioral: Negative for behavioral problems and dysphoric mood. The patient is not nervous/anxious.        Objective:   Physical Exam  Constitutional: He is oriented to person, place, and time. He appears well-developed.  HENT:  Head: Normocephalic.  Right Ear: External ear normal.  Left Ear:  External ear normal.  Eyes: Conjunctivae and EOM are normal.  Neck: Normal range of motion.  Cardiovascular: Normal rate and normal heart sounds.   Pulmonary/Chest: Breath sounds normal.  Abdominal: Bowel sounds are normal.  Musculoskeletal: Normal range of motion. He exhibits no edema or tenderness.  Neurological: He is alert and oriented to person, place, and time.  Psychiatric: He has a normal mood and affect. His behavior is normal.          Assessment & Plan:   Diabetes mellitus.  Appears to be under excellent control.  Will review hemoglobin A1c  If this remains stable.  Will recheck in 6 months at the time of his annual exam  Coronary artery disease, stable Dyslipidemia.  Continue statin therapy  Nyoka Cowden

## 2016-06-26 NOTE — Patient Instructions (Addendum)
WE NOW OFFER   [facility] FAST TRACK!!!  SAME DAY Appointments for ACUTE CARE  Such as: Sprains, Injuries, cuts, abrasions, rashes, muscle pain, joint pain, back pain Colds, flu, sore throats, headache, allergies, cough, fever  Ear pain, sinus and eye infections Abdominal pain, nausea, vomiting, diarrhea, upset stomach Animal/insect bites  3 Easy Ways to Schedule: Walk-In Scheduling Call in scheduling Mychart Sign-up: [email or web address]     Limit your sodium (Salt) intake    It is important that you exercise regularly, at least 20 minutes 3 to 4 times per week.  If you develop chest pain or shortness of breath seek  medical attention.   Please check your hemoglobin A1c every 3-6  months

## 2016-07-10 ENCOUNTER — Encounter: Payer: Self-pay | Admitting: Family Medicine

## 2016-07-10 ENCOUNTER — Ambulatory Visit (INDEPENDENT_AMBULATORY_CARE_PROVIDER_SITE_OTHER): Payer: 59 | Admitting: Family Medicine

## 2016-07-10 ENCOUNTER — Encounter: Payer: Self-pay | Admitting: Internal Medicine

## 2016-07-10 VITALS — BP 138/80 | HR 70 | Temp 98.2°F | Ht 66.0 in | Wt 200.0 lb

## 2016-07-10 DIAGNOSIS — L989 Disorder of the skin and subcutaneous tissue, unspecified: Secondary | ICD-10-CM | POA: Diagnosis not present

## 2016-07-10 DIAGNOSIS — W57XXXA Bitten or stung by nonvenomous insect and other nonvenomous arthropods, initial encounter: Secondary | ICD-10-CM

## 2016-07-10 DIAGNOSIS — L0390 Cellulitis, unspecified: Secondary | ICD-10-CM

## 2016-07-10 MED ORDER — DOXYCYCLINE HYCLATE 100 MG PO TABS: 100.0000 mg | ORAL_TABLET | Freq: Two times a day (BID) | ORAL | 0 refills | Status: DC

## 2016-07-10 NOTE — Progress Notes (Signed)
Pre visit review using our clinic review tool, if applicable. No additional management support is needed unless otherwise documented below in the visit note. 

## 2016-07-10 NOTE — Progress Notes (Signed)
HPI:  Acute visit for tick bite: -R buttock 3 days ago -not sure how long tick was biting -white removed and he did not see the tick but it was tiny -some itching, redness and soreness at bite site has developed -denies: skin rash otherwise, malaise, fevers, myalgias, jt pains, illness, etc  ROS: See pertinent positives and negatives per HPI.  Past Medical History:  Diagnosis Date  . ALCOHOL ABUSE 07/27/2008  . ANXIETY 07/27/2008  . Bifascicular block    beta blocker stopped due to profound bradycardia  . CAD 07/27/2008   a. s/p Endeavor DES to CFX 07/2008; residual OM1 70%, EF 45%;  b. relook cath 5/10: patent stent in CFX;  c.  LHC (8/15):  prox LAD 30%, OM1 50% at bifurcation, OM1 sub-branch 50%, mid AVCFX stent patent, EF 55-65% - no change from 2010 >>> Med Rx (after abnormal nuc)   . GERD 09/18/2006  . History of TIA (transient ischemic attack) may 2010  . Hx of adenomatous colonic polyps   . Hx of cardiovascular stress test    ETT-Myoview (09/2013):  Partially fixed defect with some reversibility - cannot r/o inf ischemia vs diaph atten, EF 51%; mod risk  . HYPERTENSION 09/18/2006  . LOW BACK PAIN 09/18/2006  . Myocardial infarction ([place]) 2010  . Numbness and tingling    finger tips at times  . Rosacea 07/27/2008  . SEIZURE DISORDER 09/18/2006   none since 2008  . 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  . Urinary tract infection start cipro 01-07-2012    Past Surgical History:  Procedure Laterality Date  . CORONARY STENT PLACEMENT     drug eluting  . HERNIA REPAIR    . INGUINAL HERNIA REPAIR  01/14/2012   Procedure: LAPAROSCOPIC INGUINAL HERNIA;  Surgeon: Ralene Ok, MD;  Location: [facility];  Service: General;  Laterality: Right;  . INSERTION OF MESH  01/14/2012   Procedure: INSERTION OF MESH;  Surgeon: Ralene Ok, MD;  Location: [facility];  Service: General;  Laterality: Right;  . KNEE SURGERY    . LEFT HEART CATHETERIZATION WITH CORONARY ANGIOGRAM N/A 10/21/2013   Procedure:  LEFT HEART CATHETERIZATION WITH CORONARY ANGIOGRAM;  Surgeon: Blane Ohara, MD;  Location: [facility] CATH LAB;  Service: Cardiovascular;  Laterality: N/A;    Family History  Problem Relation Age of Onset  . Lung cancer Mother   . Hypertension Mother   . Diabetes Mother   . Hyperlipidemia Mother   . Emphysema Father   . Heart failure Father   . Heart attack    . Colon cancer Neg Hx   . Stroke Neg Hx     Social History   Social History  . Marital status: Married    Spouse name: N/A  . Number of children: 2  . Years of education: N/A   Occupational History  . 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 Smoking status: Never Smoker  . Smokeless tobacco: Former Systems developer    Types: [place] date: 02/03/2012  . Alcohol use No     Comment: past drinker 2-3 daily and sometimes binge drinks on the weekend  . Drug use: No  . Sexual activity: Not Asked   Other Topics Concern  . None   Social History Narrative  . None     Current Outpatient Prescriptions:  .  ACCU-CHEK AVIVA PLUS test strip, CHECK BLOOD SUGARS ONCE PER DAY. DX: E11.9, Disp: 100 each, Rfl: 5 .  ALPRAZolam Duanne Moron)  0.5 MG tablet, TAKE 1 TABLET BY MOUTH 3 TIMES DAILY AS NEEDED, Disp: 60 tablet, Rfl: 2 .  aspirin 81 MG tablet, Take 81 mg by mouth every morning. , Disp: , Rfl:  .  Azelaic Acid (FINACEA) 15 % cream, Apply 1 application topically as directed. After skin is thoroughly washed and patted dry, gently but thoroughly massage a thin film of azelaic acid cream into the affected area twice daily, in the morning and evening. Applied to face., Disp: , Rfl:  .  benazepril (LOTENSIN) 40 MG tablet, TAKE 1 TABLET BY MOUTH EVERY DAY, Disp: 90 tablet, Rfl: 2 .  diclofenac sodium (VOLTAREN) 1 % GEL, Apply 2 g topically 2 (two) times daily as needed. Apply to painful areas around knees, Disp: , Rfl:  .  glucose blood (ACCU-CHEK AVIVA) test strip, Check Blood Sugars Once Per Day. DX:  E11.9, Disp: 100 each, Rfl: 1 .  Lancets (ACCU-CHEK MULTICLIX) lancets, Check Blood Sugars Once Per Day. DX: E11.9, Disp: 100 each, Rfl: 1 .  metFORMIN (GLUCOPHAGE-XR) 500 MG 24 hr tablet, TAKE 2 TABLETS BY MOUTH TWICE A DAY, Disp: 360 tablet, Rfl: 2 .  Multiple Vitamin (MULTIVITAMIN WITH MINERALS) TABS, Take 1 tablet by mouth daily., Disp: , Rfl:  .  NITROSTAT 0.4 MG SL tablet, DISSOLVE 1 TABLET BY MOUTH UNDER THE TONGUE EVERY 5 MINUTES AS NEEDED FOR CHEST PAIN, Disp: 25 tablet, Rfl: 1 .  pravastatin (PRAVACHOL) 20 MG tablet, TAKE 1 TABLET BY MOUTH EVERY DAY, Disp: 90 tablet, Rfl: 1 .  vitamin C (ASCORBIC ACID) 500 MG tablet, Take 500 mg by mouth daily. Reported on 08/23/2015, Disp: , Rfl:  .  doxycycline (VIBRA-TABS) 100 MG tablet, Take 1 tablet (100 mg total) by mouth 2 (two) times daily., Disp: 10 tablet, Rfl: 0  EXAM:  Vitals:   07/10/16 1307  BP: 138/80  Pulse: 70  Temp: 98.2 F (36.8 C)    Body mass index is 32.28 kg/m.  GENERAL: vitals reviewed and listed above, alert, oriented, appears well hydrated and in no acute distress  HEENT: atraumatic, conjunttiva clear, no obvious abnormalities on inspection of external nose and ears  NECK: no obvious masses on inspection  SKIN: small bite papule R buttock with small area surrounding erythema ,no other rash appreciated  MS: moves all extremities without noticeable abnormality  PSYCH: pleasant and cooperative, no obvious depression or anxiety  ASSESSMENT AND PLAN:  Discussed the following assessment and plan:  Tick bite, initial encounter  -discussed various tick borne illness, signs and symptoms, treatments and tick bite prevention -opted to tx with short course doxy for possible mild site related cellulitis and for lyme prophylaxis -Patient advised to return or notify a doctor immediately if symptoms worsen or persist or new concerns arise. -declined AVS  There are no Patient Instructions on file for this visit.  Colin Benton R., DO

## 2016-07-23 ENCOUNTER — Other Ambulatory Visit: Payer: Self-pay | Admitting: Internal Medicine

## 2016-09-20 ENCOUNTER — Other Ambulatory Visit: Payer: Self-pay | Admitting: Internal Medicine

## 2016-10-06 ENCOUNTER — Other Ambulatory Visit: Payer: Self-pay | Admitting: Internal Medicine

## 2016-11-22 ENCOUNTER — Encounter: Payer: Self-pay | Admitting: Internal Medicine

## 2016-12-05 ENCOUNTER — Ambulatory Visit: Payer: 59 | Admitting: Cardiovascular Disease

## 2017-01-07 ENCOUNTER — Other Ambulatory Visit: Payer: Self-pay | Admitting: Internal Medicine

## 2017-01-10 ENCOUNTER — Encounter: Payer: Self-pay | Admitting: Cardiovascular Disease

## 2017-01-10 ENCOUNTER — Ambulatory Visit: Payer: 59 | Admitting: Cardiovascular Disease

## 2017-01-10 VITALS — BP 168/70 | HR 77 | Ht 67.0 in | Wt 199.4 lb

## 2017-01-10 DIAGNOSIS — E785 Hyperlipidemia, unspecified: Secondary | ICD-10-CM

## 2017-01-10 DIAGNOSIS — I10 Essential (primary) hypertension: Secondary | ICD-10-CM | POA: Diagnosis not present

## 2017-01-10 DIAGNOSIS — I2510 Atherosclerotic heart disease of native coronary artery without angina pectoris: Secondary | ICD-10-CM | POA: Diagnosis not present

## 2017-01-10 MED ORDER — ATORVASTATIN CALCIUM 20 MG PO TABS: 20.0000 mg | ORAL_TABLET | Freq: Every day | ORAL | 3 refills | Status: DC

## 2017-01-10 NOTE — Progress Notes (Signed)
Cardiology Office Note Date:  01/10/2017   ID:  Jose [name], DOB [birth year], MRN [id number]  PCP:  Marletta Lor, MD  Cardiologist:  [name], MD    Chief Complaint  Patient presents with  . Coronary Artery Disease     History of Present Illness: Jose [name] is a 66 y.o. male who presents for follow-up of coronary artery disease.  Patient initially underwent drug-eluting stent implantation of the left circumflex in 2010.  He had a repeat catheterization in 2015 when he presented with chest pain.  This demonstrated patency of his stent site and moderate nonobstructive disease in the obtuse marginal branch unchanged from his previous study.  Medical therapy was recommended.  He was last seen in August 2017.  He has a long hx of white coat HTN. Checks BP every day and averages 115-122/70's.  He feels well and has no complaints.  He specifically denies chest pain, shortness of breath, edema, lightheadedness, heart palpitations, orthopnea, or PND.  He remains fairly active with his work.  He averages about 09-7998 steps per day.  Past Medical History:  Diagnosis Date  . ALCOHOL ABUSE 07/27/2008  . ANXIETY 07/27/2008  . Bifascicular block    beta blocker stopped due to profound bradycardia  . CAD 07/27/2008   a. s/p Endeavor DES to CFX 07/2008; residual OM1 70%, EF 45%;  b. relook cath 5/10: patent stent in CFX;  c.  LHC (8/15):  prox LAD 30%, OM1 50% at bifurcation, OM1 sub-branch 50%, mid AVCFX stent patent, EF 55-65% - no change from 2010 >>> Med Rx (after abnormal nuc)   . GERD 09/18/2006  . History of TIA (transient ischemic attack) may 2010  . Hx of adenomatous colonic polyps   . Hx of cardiovascular stress test    ETT-Myoview (09/2013):  Partially fixed defect with some reversibility - cannot r/o inf ischemia vs diaph atten, EF 51%; mod risk  . HYPERTENSION 09/18/2006  . LOW BACK PAIN 09/18/2006  . Myocardial infarction ([place]) 2010  . Numbness and tingling    finger tips  at times  . Rosacea 07/27/2008  . SEIZURE DISORDER 09/18/2006   none since 2008  . 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  . Urinary tract infection start cipro 01-07-2012    Past Surgical History:  Procedure Laterality Date  . CORONARY STENT PLACEMENT     drug eluting  . HERNIA REPAIR    . KNEE SURGERY      Current Outpatient Medications  Medication Sig Dispense Refill  . ACCU-CHEK AVIVA PLUS test strip CHECK BLOOD SUGARS ONCE PER DAY. DX: E11.9 100 each 5  . ALPRAZolam (XANAX) 0.5 MG tablet Take 0.5 mg 3 (three) times daily as needed by mouth for anxiety.    Marland Kitchen aspirin 81 MG tablet Take 81 mg by mouth every morning.     . Azelaic Acid (FINACEA) 15 % cream Apply 1 application topically as directed. After skin is thoroughly washed and patted dry, gently but thoroughly massage a thin film of azelaic acid cream into the affected area twice daily, in the morning and evening. Applied to face.    . benazepril (LOTENSIN) 40 MG tablet TAKE 1 TABLET BY MOUTH EVERY DAY 90 tablet 2  . diclofenac sodium (VOLTAREN) 1 % GEL Apply 2 g topically 2 (two) times daily as needed. Apply to painful areas around knees    . doxycycline (VIBRA-TABS) 100 MG tablet Take 1 tablet (100 mg total) by mouth 2 (two) times daily. 10 tablet  0  . glucose blood (ACCU-CHEK AVIVA) test strip Check Blood Sugars Once Per Day. DX: E11.9 100 each 1  . Lancets (ACCU-CHEK MULTICLIX) lancets CHECK BLOOD SUGARS ONCE PER DAY. DX: E11.9 102 each 1  . metFORMIN (GLUCOPHAGE-XR) 500 MG 24 hr tablet TAKE 2 TABLETS BY MOUTH TWICE A DAY 360 tablet 2  . Multiple Vitamin (MULTIVITAMIN WITH MINERALS) TABS Take 1 tablet by mouth daily.    Marland Kitchen NITROSTAT 0.4 MG SL tablet DISSOLVE 1 TABLET BY MOUTH UNDER THE TONGUE EVERY 5 MINUTES AS NEEDED FOR CHEST PAIN 25 tablet 1  . vitamin C (ASCORBIC ACID) 500 MG tablet Take 500 mg by mouth daily. Reported on 08/23/2015     No current facility-administered medications for this visit.     Allergies:   Patient has no known  allergies.   Social History:  The patient  reports that  has never smoked. He quit smokeless tobacco use about 4 years ago. His smokeless tobacco use included chew. He reports that he does not drink alcohol or use drugs.   Family History:  The patient's  family history includes Diabetes in his mother; Emphysema in his father; Heart attack in his unknown relative; Heart failure in his father; Hyperlipidemia in his mother; Hypertension in his mother; Lung cancer in his mother.    ROS:  Please see the history of present illness.   All other systems are reviewed and negative.    PHYSICAL EXAM: VS:  BP (!) 168/70   Pulse 77   Ht 5\' 7"  (1.702 m)   Wt 199 lb 6.4 oz (90.4 kg)   BMI 31.23 kg/m  , BMI Body mass index is 31.23 kg/m. GEN: Well nourished, well developed, in no acute distress  HEENT: normal  Neck: no JVD, no masses. No carotid bruits Cardiac: RRR without murmur or gallop                Respiratory:  clear to auscultation bilaterally, normal work of breathing GI: soft, nontender, nondistended, + BS MS: no deformity or atrophy  Ext: no pretibial edema, pedal pulses 2+= bilaterally Skin: warm and dry, no rash Neuro:  Strength and sensation are intact Psych: euthymic mood, full affect  EKG:  EKG is ordered today. The ekg ordered today shows normal sinus rhythm 77 bpm, right bundle branch block, left anterior fascicular block.  Recent Labs: No results found for requested labs within last 8760 hours.   Lipid Panel     Component Value Date/Time   CHOL 164 08/05/2015 1134   TRIG 154.0 (H) 08/05/2015 1134   HDL 38.30 (L) 08/05/2015 1134   CHOLHDL 4 08/05/2015 1134   VLDL 30.8 08/05/2015 1134   LDLCALC 95 08/05/2015 1134   LDLDIRECT 163.3 07/09/2007 0818      Wt Readings from Last 3 Encounters:  01/10/17 199 lb 6.4 oz (90.4 kg)  07/10/16 200 lb (90.7 kg)  06/26/16 198 lb 6.4 oz (90 kg)     ASSESSMENT AND PLAN: 1.  Coronary artery disease, native vessel, without  angina: He remains on aspirin 81 mg daily.  Plavix was discontinued 1 year ago. No anginal symptoms.   2.  Hypertension: Blood pressure is treated with benazepril.  Home readings are very well controlled.  3.  Hyperlipidemia: Treated with pravastatin.  Review lipids today and they are above goal.  Recommend change to atorvastatin 20 mg daily and repeat lipids and LFTs in 3 months.  4.  Bifascicular block: No change on EKG patient is  completely asymptomatic.  Discussed potential issues down the road.  He understands that he may be at higher risk of progressive conduction problems and he understands to seek immediate medical attention if he becomes lightheaded or dizzy.  Current medicines are reviewed with the patient today.  The patient does not have concerns regarding medicines.  Labs/ tests ordered today include:  No orders of the defined types were placed in this encounter.   Disposition:   FU one year  Signed, [name], MD  01/10/2017 3:15 PM    [facility] [address], [place], [place]  [address] Phone: [phone]; Fax: [phone]

## 2017-01-10 NOTE — Patient Instructions (Signed)
Medication Instructions:  1) STOP PRAVASTATIN 2) START LIPITOR 20 mg daily  Labwork: Your provider recommends that you return for FASTING lab work in 3 MONTHS   Testing/Procedures: None  Follow-Up: Your provider wants you to follow-up in: 1 year with Dr. Burt Knack. You will receive a reminder letter in the mail two months in advance. If you don't receive a letter, please call our office to schedule the follow-up appointment.    Any Other Special Instructions Will Be Listed Below (If Applicable).     If you need a refill on your cardiac medications before your next appointment, please call your pharmacy.

## 2017-01-22 ENCOUNTER — Other Ambulatory Visit (HOSPITAL_COMMUNITY): Payer: Self-pay | Admitting: Internal Medicine

## 2017-01-31 ENCOUNTER — Other Ambulatory Visit: Payer: Self-pay | Admitting: Internal Medicine

## 2017-02-08 ENCOUNTER — Other Ambulatory Visit (HOSPITAL_COMMUNITY): Payer: Self-pay | Admitting: *Deleted

## 2017-02-08 MED ORDER — NITROGLYCERIN 0.4 MG SL SUBL: SUBLINGUAL_TABLET | SUBLINGUAL | 5 refills | Status: DC

## 2017-03-15 ENCOUNTER — Other Ambulatory Visit: Payer: Self-pay | Admitting: Internal Medicine

## 2017-03-16 ENCOUNTER — Other Ambulatory Visit: Payer: Self-pay | Admitting: Internal Medicine

## 2017-03-17 ENCOUNTER — Other Ambulatory Visit: Payer: Self-pay | Admitting: Internal Medicine

## 2017-04-11 ENCOUNTER — Other Ambulatory Visit: Payer: Self-pay | Admitting: Internal Medicine

## 2017-04-16 ENCOUNTER — Other Ambulatory Visit: Payer: 59

## 2017-04-29 ENCOUNTER — Encounter: Payer: 59 | Admitting: Internal Medicine

## 2017-05-20 ENCOUNTER — Other Ambulatory Visit: Payer: Self-pay | Admitting: *Deleted

## 2017-05-20 ENCOUNTER — Telehealth: Payer: Self-pay | Admitting: Internal Medicine

## 2017-05-20 NOTE — Telephone Encounter (Signed)
Copied from [place] [phone]. Topic: Quick Communication - Rx Refill/Question >> May 20, 2017 10:58 AM Oliver Pila B wrote: Pt called to state that he has been switched to the One Touch Verio and he is needing the Lancets and Test Strips to go along w/ it, contact pt to advise

## 2017-05-20 NOTE — Telephone Encounter (Signed)
Call to patient- [facility] is pharmacy. Call to pharmacy- they have the new meter on file- test strips and lancets ordered for new meter. Same directions, number and refills as previous.

## 2017-05-27 ENCOUNTER — Encounter: Payer: Self-pay | Admitting: Gastroenterology

## 2017-06-03 ENCOUNTER — Encounter: Payer: Self-pay | Admitting: Internal Medicine

## 2017-06-03 ENCOUNTER — Ambulatory Visit (INDEPENDENT_AMBULATORY_CARE_PROVIDER_SITE_OTHER): Payer: 59 | Admitting: Internal Medicine

## 2017-06-03 VITALS — BP 142/60 | HR 88 | Temp 98.2°F | Ht 67.25 in | Wt 195.0 lb

## 2017-06-03 DIAGNOSIS — I10 Essential (primary) hypertension: Secondary | ICD-10-CM

## 2017-06-03 DIAGNOSIS — I2510 Atherosclerotic heart disease of native coronary artery without angina pectoris: Secondary | ICD-10-CM

## 2017-06-03 DIAGNOSIS — E1151 Type 2 diabetes mellitus with diabetic peripheral angiopathy without gangrene: Secondary | ICD-10-CM

## 2017-06-03 DIAGNOSIS — Z86010 Personal history of colonic polyps: Secondary | ICD-10-CM

## 2017-06-03 DIAGNOSIS — Z0000 Encounter for general adult medical examination without abnormal findings: Secondary | ICD-10-CM

## 2017-06-03 DIAGNOSIS — E785 Hyperlipidemia, unspecified: Secondary | ICD-10-CM | POA: Diagnosis not present

## 2017-06-03 NOTE — Progress Notes (Signed)
Subjective:    Patient ID: Jose [name], male    DOB: Mar 09, 1950, 67 y.o.   MRN: [id number]  HPI  67 year old patient who is seen today for an annual preventive health examination. He has a history of CAD and is followed by cardiology he has essential hypertension and does monitor home blood pressures carefully. He has type 2 diabetes which has been managed with metformin therapy Doing quite well without cardiopulmonary complaints He has been contacted about follow-up 5-year colonoscopy which he plans to do soon  Social history.  In the process of completing Divinity school  Past Medical History:  Diagnosis Date  . ALCOHOL ABUSE 07/27/2008  . ANXIETY 07/27/2008  . Bifascicular block    beta blocker stopped due to profound bradycardia  . CAD 07/27/2008   a. s/p Endeavor DES to CFX 07/2008; residual OM1 70%, EF 45%;  b. relook cath 5/10: patent stent in CFX;  c.  LHC (8/15):  prox LAD 30%, OM1 50% at bifurcation, OM1 sub-branch 50%, mid AVCFX stent patent, EF 55-65% - no change from 2010 >>> Med Rx (after abnormal nuc)   . GERD 09/18/2006  . History of TIA (transient ischemic attack) may 2010  . Hx of adenomatous colonic polyps   . Hx of cardiovascular stress test    ETT-Myoview (09/2013):  Partially fixed defect with some reversibility - cannot r/o inf ischemia vs diaph atten, EF 51%; mod risk  . HYPERTENSION 09/18/2006  . LOW BACK PAIN 09/18/2006  . Myocardial infarction ([place]) 2010  . Numbness and tingling    finger tips at times  . Rosacea 07/27/2008  . SEIZURE DISORDER 09/18/2006   none since 2008  . 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  . Urinary tract infection start cipro 01-07-2012     Social History   Socioeconomic History  . Marital status: Married    Spouse name: Not on file  . Number of children: 2  . Years of education: Not on file  . Highest education level: Not on file  Occupational History  . Occupation: Civil engineer, contracting: [employer]    Social Needs  . Financial resource strain: Not on file  . Food insecurity:    Worry: Not on file    Inability: Not on file  . Transportation needs:    Medical: Not on file    Non-medical: Not on file  Tobacco Use  . Smoking status: Never Smoker  . Smokeless tobacco: Former Systems developer    Types: Chew  Substance and Sexual Activity  . Alcohol use: No    Comment: past drinker 2-3 daily and sometimes binge drinks on the weekend  . Drug use: No  . Sexual activity: Not on file  Lifestyle  . Physical activity:    Days per week: Not on file    Minutes per session: Not on file  . Stress: Not on file  Relationships  . Social connections:    Talks on phone: Not on file    Gets together: Not on file    Attends religious service: Not on file    Active member of club or organization: Not on file    Attends meetings of clubs or organizations: Not on file    Relationship status: Not on file  . Intimate partner violence:    Fear of current or ex partner: Not on file    Emotionally abused: Not on file    Physically abused: Not on file    Forced sexual activity: Not  on file  Other Topics Concern  . Not on file  Social History Narrative  . Not on file    Past Surgical History:  Procedure Laterality Date  . CORONARY STENT PLACEMENT     drug eluting  . HERNIA REPAIR    . INGUINAL HERNIA REPAIR  01/14/2012   Procedure: LAPAROSCOPIC INGUINAL HERNIA;  Surgeon: Ralene Ok, MD;  Location: [facility];  Service: General;  Laterality: Right;  . INSERTION OF MESH  01/14/2012   Procedure: INSERTION OF MESH;  Surgeon: Ralene Ok, MD;  Location: [facility];  Service: General;  Laterality: Right;  . KNEE SURGERY    . LEFT HEART CATHETERIZATION WITH CORONARY ANGIOGRAM N/A 10/21/2013   Procedure: LEFT HEART CATHETERIZATION WITH CORONARY ANGIOGRAM;  Surgeon: Blane Ohara, MD;  Location: [facility] CATH LAB;  Service: Cardiovascular;  Laterality: N/A;    Family History  Problem Relation Age of Onset  .  Lung cancer Mother   . Hypertension Mother   . Diabetes Mother   . Hyperlipidemia Mother   . Emphysema Father   . Heart failure Father   . Heart attack Unknown   . Colon cancer Neg Hx   . Stroke Neg Hx     No Known Allergies  Current Outpatient Medications on File Prior to Visit  Medication Sig Dispense Refill  . ACCU-CHEK AVIVA PLUS test strip CHECK BLOOD SUGARS ONCE PER DAY. DX: E11.9 100 each 4  . ALPRAZolam (XANAX) 0.5 MG tablet Take 0.5 mg 3 (three) times daily as needed by mouth for anxiety.    Marland Kitchen aspirin 81 MG tablet Take 81 mg by mouth every morning.     Marland Kitchen atorvastatin (LIPITOR) 20 MG tablet Take 1 tablet (20 mg total) daily by mouth. 90 tablet 3  . Azelaic Acid (FINACEA) 15 % cream Apply 1 application topically as directed. After skin is thoroughly washed and patted dry, gently but thoroughly massage a thin film of azelaic acid cream into the affected area twice daily, in the morning and evening. Applied to face.    . benazepril (LOTENSIN) 40 MG tablet TAKE 1 TABLET BY MOUTH EVERY DAY 90 tablet 2  . diclofenac sodium (VOLTAREN) 1 % GEL Apply 2 g topically 2 (two) times daily as needed. Apply to painful areas around knees    . glucose blood (ACCU-CHEK AVIVA) test strip Check Blood Sugars Once Per Day. DX: E11.9 100 each 1  . Lancets (ACCU-CHEK MULTICLIX) lancets CHECK BLOOD SUGARS ONCE PER DAY. DX: E11.9 102 each 1  . metFORMIN (GLUCOPHAGE-XR) 500 MG 24 hr tablet TAKE 2 TABLETS BY MOUTH TWICE A DAY 360 tablet 2  . Multiple Vitamin (MULTIVITAMIN WITH MINERALS) TABS Take 1 tablet by mouth daily.    . nitroGLYCERIN (NITROSTAT) 0.4 MG SL tablet DISSOLVE 1 TABLET BY MOUTH UNDER THE TONGUE EVERY 5 MINUTES AS NEEDED FOR CHEST PAIN 75 tablet 5  . vitamin C (ASCORBIC ACID) 500 MG tablet Take 500 mg by mouth daily. Reported on 08/23/2015     No current facility-administered medications on file prior to visit.     BP (!) 142/60 (BP Location: Right Arm, Patient Position: Sitting, Cuff Size:  Large)   Pulse 88   Temp 98.2 F (36.8 C) (Oral)   Ht 5' 7.25" (1.708 m)   Wt 195 lb (88.5 kg)   SpO2 96%   BMI 30.31 kg/m     Review of Systems  Constitutional: Negative for appetite change, chills, fatigue and fever.  HENT: Negative for congestion,  dental problem, ear pain, hearing loss, sore throat, tinnitus, trouble swallowing and voice change.   Eyes: Negative for pain, discharge and visual disturbance.  Respiratory: Negative for cough, chest tightness, wheezing and stridor.   Cardiovascular: Negative for chest pain, palpitations and leg swelling.  Gastrointestinal: Negative for abdominal distention, abdominal pain, blood in stool, constipation, diarrhea, nausea and vomiting.  Genitourinary: Negative for difficulty urinating, discharge, flank pain, genital sores, hematuria and urgency.  Musculoskeletal: Negative for arthralgias, back pain, gait problem, joint swelling, myalgias and neck stiffness.  Skin: Negative for rash.  Neurological: Negative for dizziness, syncope, speech difficulty, weakness, numbness and headaches.  Hematological: Negative for adenopathy. Does not bruise/bleed easily.  Psychiatric/Behavioral: Negative for behavioral problems and dysphoric mood. The patient is not nervous/anxious.        Objective:   Physical Exam  Constitutional: He appears well-developed and well-nourished.  HENT:  Head: Normocephalic and atraumatic.  Right Ear: External ear normal.  Left Ear: External ear normal.  Nose: Nose normal.  Mouth/Throat: Oropharynx is clear and moist.  Eyes: Pupils are equal, round, and reactive to light. Conjunctivae and EOM are normal. No scleral icterus.  Neck: Normal range of motion. Neck supple. No JVD present. No thyromegaly present.  Cardiovascular: Regular rhythm, normal heart sounds and intact distal pulses. Exam reveals no gallop and no friction rub.  No murmur heard. Pulmonary/Chest: Effort normal and breath sounds normal. He exhibits no  tenderness.  Abdominal: Soft. Bowel sounds are normal. He exhibits no distension and no mass. There is no tenderness.  Genitourinary: Penis normal. Rectal exam shows guaiac negative stool.  Genitourinary Comments: Symmetrical prostatic enlargement  Musculoskeletal: Normal range of motion. He exhibits no edema or tenderness.  Lymphadenopathy:    He has no cervical adenopathy.  Neurological: He is alert. He has normal reflexes. No cranial nerve deficit. Coordination normal.  Skin: Skin is warm and dry. No rash noted.  Psychiatric: He has a normal mood and affect. His behavior is normal.          Assessment & Plan:  Preventive health examination  History of colonic polyps.  Follow-up colonoscopy planned in the near future Type 2 diabetes.  Will check hemoglobin A1c lipid profile and urine for microalbumin.  He states that he can get the studies performed at work free of charge which he prefers and will fax results to this office  Dyslipidemia continue statin therapy Essential hypertension stable Coronary artery disease asymptomatic  Follow-up 6 months  colonoscopy as scheduled  Nyoka Cowden

## 2017-06-03 NOTE — Patient Instructions (Signed)
Limit your sodium (Salt) intake    It is important that you exercise regularly, at least 20 minutes 3 to 4 times per week.  If you develop chest pain or shortness of breath seek  medical attention.   Please check your hemoglobin A1c every 6 months  Schedule your colonoscopy to help detect colon cancer.  Please see your eye doctor yearly to check for diabetic eye damage

## 2017-06-20 ENCOUNTER — Other Ambulatory Visit: Payer: Self-pay | Admitting: Internal Medicine

## 2017-07-03 ENCOUNTER — Encounter: Payer: Self-pay | Admitting: Gastroenterology

## 2017-09-02 ENCOUNTER — Ambulatory Visit (AMBULATORY_SURGERY_CENTER): Payer: Self-pay

## 2017-09-02 ENCOUNTER — Other Ambulatory Visit: Payer: Self-pay

## 2017-09-02 VITALS — Ht 67.0 in | Wt 196.2 lb

## 2017-09-02 DIAGNOSIS — Z86010 Personal history of colonic polyps: Secondary | ICD-10-CM

## 2017-09-02 MED ORDER — NA SULFATE-K SULFATE-MG SULF 17.5-3.13-1.6 GM/177ML PO SOLN: 1.0000 | Freq: Once | ORAL | 0 refills | Status: AC

## 2017-09-16 ENCOUNTER — Ambulatory Visit (AMBULATORY_SURGERY_CENTER): Payer: 59 | Admitting: Gastroenterology

## 2017-09-16 ENCOUNTER — Encounter: Payer: Self-pay | Admitting: Gastroenterology

## 2017-09-16 VITALS — BP 138/70 | HR 60 | Temp 99.1°F | Resp 21 | Ht 67.0 in | Wt 196.0 lb

## 2017-09-16 DIAGNOSIS — K5730 Diverticulosis of large intestine without perforation or abscess without bleeding: Secondary | ICD-10-CM

## 2017-09-16 DIAGNOSIS — Z86010 Personal history of colonic polyps: Secondary | ICD-10-CM

## 2017-09-16 DIAGNOSIS — D124 Benign neoplasm of descending colon: Secondary | ICD-10-CM

## 2017-09-16 DIAGNOSIS — D126 Benign neoplasm of colon, unspecified: Secondary | ICD-10-CM | POA: Diagnosis not present

## 2017-09-16 DIAGNOSIS — K635 Polyp of colon: Secondary | ICD-10-CM

## 2017-09-16 DIAGNOSIS — D120 Benign neoplasm of cecum: Secondary | ICD-10-CM

## 2017-09-16 DIAGNOSIS — D123 Benign neoplasm of transverse colon: Secondary | ICD-10-CM

## 2017-09-16 MED ORDER — SODIUM CHLORIDE 0.9 % IV SOLN: 500.0000 mL | Freq: Once | INTRAVENOUS | Status: DC

## 2017-09-16 NOTE — Op Note (Signed)
[facility] Patient Name: Jose [name] Procedure Date: 09/16/2017 8:42 AM MRN: [id number] Endoscopist: Milus Banister , MD Age: 67 Referring MD:  Date of Birth: 1950-06-24 Gender: Male Account #: [id number] Procedure:                Colonoscopy Indications:              High risk colon cancer surveillance: Personal                            history of colonic polyps; colonoscopy 2008 several                            adenomatous polyps; colonoscoyp 2011 two subCM                            adenomatous polyps Medicines:                Monitored Anesthesia Care Procedure:                Pre-Anesthesia Assessment:                           - Prior to the procedure, a History and Physical                            was performed, and patient medications and                            allergies were reviewed. The patient's tolerance of                            previous anesthesia was also reviewed. The risks                            and benefits of the procedure and the sedation                            options and risks were discussed with the patient.                            All questions were answered, and informed consent                            was obtained. Prior Anticoagulants: The patient has                            taken no previous anticoagulant or antiplatelet                            agents. ASA Grade Assessment: II - A patient with                            mild systemic disease. After reviewing the risks  and benefits, the patient was deemed in                            satisfactory condition to undergo the procedure.                           After obtaining informed consent, the colonoscope                            was passed under direct vision. Throughout the                            procedure, the patient's blood pressure, pulse, and                            oxygen saturations were monitored continuously. The                             Colonoscope was introduced through the anus and                            advanced to the the cecum, identified by                            appendiceal orifice and ileocecal valve. The                            colonoscopy was performed without difficulty. The                            patient tolerated the procedure well. The quality                            of the bowel preparation was good. The ileocecal                            valve, appendiceal orifice, and rectum were                            photographed. Scope In: 8:45:16 AM Scope Out: 8:55:59 AM Scope Withdrawal Time: 0 hours 9 minutes 6 seconds  Total Procedure Duration: 0 hours 10 minutes 43 seconds  Findings:                 Five sessile polyps were found in the descending                            colon, transverse colon and cecum. The polyps were                            2 to 4 mm in size. These polyps were removed with a                            cold snare. Resection and retrieval were complete.  Multiple small and large-mouthed diverticula were                            found in the left colon.                           The exam was otherwise without abnormality on                            direct and retroflexion views. Complications:            No immediate complications. Estimated blood loss:                            None. Estimated Blood Loss:     Estimated blood loss: none. Impression:               - Five 2 to 4 mm polyps in the descending colon, in                            the transverse colon and in the cecum, removed with                            a cold snare. Resected and retrieved.                           - Diverticulosis in the left colon.                           - The examination was otherwise normal on direct                            and retroflexion views. Recommendation:           - Patient has a contact number available  for                            emergencies. The signs and symptoms of potential                            delayed complications were discussed with the                            patient. Return to normal activities tomorrow.                            Written discharge instructions were provided to the                            patient.                           - Resume previous diet.                           - Continue present medications.  You will receive a letter within 2-3 weeks with the                            pathology results and my final recommendations.                           If the polyp(s) is proven to be 'pre-cancerous' on                            pathology, you will need repeat colonoscopy in 3-5                            years. Milus Banister, MD 09/16/2017 8:58:30 AM This report has been signed electronically.

## 2017-09-16 NOTE — Progress Notes (Signed)
Called to room to assist during endoscopic procedure.  Patient ID and intended procedure confirmed with present staff. Received instructions for my participation in the procedure from the performing physician.  

## 2017-09-16 NOTE — Patient Instructions (Signed)
*   handout on polyps and diverticulosis given*  YOU HAD AN ENDOSCOPIC PROCEDURE TODAY AT THE [facility]:   Refer to the procedure report that was given to you for any specific questions about what was found during the examination.  If the procedure report does not answer your questions, please call your gastroenterologist to clarify.  If you requested that your care partner not be given the details of your procedure findings, then the procedure report has been included in a sealed envelope for you to review at your convenience later.  YOU SHOULD EXPECT: Some feelings of bloating in the abdomen. Passage of more gas than usual.  Walking can help get rid of the air that was put into your GI tract during the procedure and reduce the bloating. If you had a lower endoscopy (such as a colonoscopy or flexible sigmoidoscopy) you may notice spotting of blood in your stool or on the toilet paper. If you underwent a bowel prep for your procedure, you may not have a normal bowel movement for a few days.  Please Note:  You might notice some irritation and congestion in your nose or some drainage.  This is from the oxygen used during your procedure.  There is no need for concern and it should clear up in a day or so.  SYMPTOMS TO REPORT IMMEDIATELY:   Following lower endoscopy (colonoscopy or flexible sigmoidoscopy):  Excessive amounts of blood in the stool  Significant tenderness or worsening of abdominal pains  Swelling of the abdomen that is new, acute  Fever of 100F or higher   For urgent or emergent issues, a gastroenterologist can be reached at any hour by calling [phone].   DIET:  We do recommend a small meal at first, but then you may proceed to your regular diet.  Drink plenty of fluids but you should avoid alcoholic beverages for 24 hours.  ACTIVITY:  You should plan to take it easy for the rest of today and you should NOT DRIVE or use heavy machinery until tomorrow (because of  the sedation medicines used during the test).    FOLLOW UP: Our staff will call the number listed on your records the next business day following your procedure to check on you and address any questions or concerns that you may have regarding the information given to you following your procedure. If we do not reach you, we will leave a message.  However, if you are feeling well and you are not experiencing any problems, there is no need to return our call.  We will assume that you have returned to your regular daily activities without incident.  If any biopsies were taken you will be contacted by phone or by letter within the next 1-3 weeks.  Please call us at [phone] if you have not heard about the biopsies in 3 weeks.    SIGNATURES/CONFIDENTIALITY: You and/or your care partner have signed paperwork which will be entered into your electronic medical record.  These signatures attest to the fact that that the information above on your After Visit Summary has been reviewed and is understood.  Full responsibility of the confidentiality of this discharge information lies with you and/or your care-partner. 

## 2017-09-16 NOTE — Progress Notes (Signed)
Pt's states no medical or surgical changes since previsit or office visit. 

## 2017-09-16 NOTE — Progress Notes (Signed)
Report given to PACU, vss 

## 2017-09-17 ENCOUNTER — Telehealth: Payer: Self-pay

## 2017-09-17 NOTE — Telephone Encounter (Signed)
  Follow up Call-  Call back number 09/16/2017  Post procedure Call Back phone  # [phone]  Permission to leave phone message Yes  Some recent data might be hidden     Patient questions:  Do you have a fever, pain , or abdominal swelling? No. Pain Score  0 *  Have you tolerated food without any problems? Yes.    Have you been able to return to your normal activities? Yes.    Do you have any questions about your discharge instructions: Diet   No. Medications  No. Follow up visit  No.  Do you have questions or concerns about your Care? No.  Actions: * If pain score is 4 or above: No action needed, pain <4.

## 2017-09-21 ENCOUNTER — Encounter: Payer: Self-pay | Admitting: Gastroenterology

## 2017-09-26 ENCOUNTER — Encounter: Payer: Self-pay | Admitting: Gastroenterology

## 2017-10-21 ENCOUNTER — Other Ambulatory Visit: Payer: Self-pay | Admitting: Internal Medicine

## 2017-11-21 ENCOUNTER — Encounter: Payer: Self-pay | Admitting: Internal Medicine

## 2017-11-21 ENCOUNTER — Ambulatory Visit: Payer: 59 | Admitting: Internal Medicine

## 2017-11-21 VITALS — BP 140/88 | HR 80 | Temp 98.5°F | Wt 193.4 lb

## 2017-11-21 DIAGNOSIS — I2510 Atherosclerotic heart disease of native coronary artery without angina pectoris: Secondary | ICD-10-CM | POA: Diagnosis not present

## 2017-11-21 DIAGNOSIS — I10 Essential (primary) hypertension: Secondary | ICD-10-CM | POA: Diagnosis not present

## 2017-11-21 DIAGNOSIS — E1151 Type 2 diabetes mellitus with diabetic peripheral angiopathy without gangrene: Secondary | ICD-10-CM

## 2017-11-21 LAB — HEMOGLOBIN A1C: HEMOGLOBIN A1C: 5.9 % (ref 4.6–6.5)

## 2017-11-21 MED ORDER — ATORVASTATIN CALCIUM 20 MG PO TABS: 20.0000 mg | ORAL_TABLET | Freq: Every day | ORAL | 3 refills | Status: DC

## 2017-11-21 MED ORDER — METFORMIN HCL ER 500 MG PO TB24: 1000.0000 mg | ORAL_TABLET | Freq: Two times a day (BID) | ORAL | 2 refills | Status: DC

## 2017-11-21 MED ORDER — BISOPROLOL FUMARATE 5 MG PO TABS: 5.0000 mg | ORAL_TABLET | Freq: Every day | ORAL | 4 refills | Status: DC

## 2017-11-21 MED ORDER — ALPRAZOLAM 0.5 MG PO TABS: 0.5000 mg | ORAL_TABLET | Freq: Three times a day (TID) | ORAL | 2 refills | Status: DC | PRN

## 2017-11-21 MED ORDER — BENAZEPRIL HCL 40 MG PO TABS: 40.0000 mg | ORAL_TABLET | Freq: Every day | ORAL | 3 refills | Status: DC

## 2017-11-21 NOTE — Progress Notes (Signed)
Subjective:    Patient ID: Jose [name], male    DOB: 04-23-1950, 67 y.o.   MRN: [id number]  HPI  67 year old patient who is seen today for follow-up.  He has a history of coronary artery disease essential hypertension and type 2 diabetes.  He is doing well. He did have laboratory studies performed at work recently HDL cholesterol increased to 57 LDL cholesterol was 69.  A fasting blood sugar was 83  Generally has been stable.  He does monitor blood pressure and pulse daily blood pressure has been slightly labile but often well-controlled.  His pulse rate is often quite labile and he often has readings greater than 120.  He does have chronic anxiety and does take alprazolam generally once every morning  Social history.  He plans on full-time ministry in about 1 year  Past Medical History:  Diagnosis Date  . ALCOHOL ABUSE 07/27/2008  . Allergy   . ANXIETY 07/27/2008  . Arthritis   . Bifascicular block    beta blocker stopped due to profound bradycardia  . CAD 07/27/2008   a. s/p Endeavor DES to CFX 07/2008; residual OM1 70%, EF 45%;  b. relook cath 5/10: patent stent in CFX;  c.  LHC (8/15):  prox LAD 30%, OM1 50% at bifurcation, OM1 sub-branch 50%, mid AVCFX stent patent, EF 55-65% - no change from 2010 >>> Med Rx (after abnormal nuc)   . Cataract   . Diabetes mellitus without complication ([place])   . GERD 09/18/2006  . History of TIA (transient ischemic attack) may 2010  . Hx of adenomatous colonic polyps   . Hx of cardiovascular stress test    ETT-Myoview (09/2013):  Partially fixed defect with some reversibility - cannot r/o inf ischemia vs diaph atten, EF 51%; mod risk  . Hyperlipidemia   . HYPERTENSION 09/18/2006  . LOW BACK PAIN 09/18/2006  . Myocardial infarction ([place]) 2010  . Numbness and tingling    finger tips at times  . Rosacea 07/27/2008  . SEIZURE DISORDER 09/18/2006   none since 2008  . 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  . Urinary tract infection start cipro 01-07-2012       Social History   Socioeconomic History  . Marital status: Married    Spouse name: Not on file  . Number of children: 2  . Years of education: Not on file  . Highest education level: Not on file  Occupational History  . Occupation: Civil engineer, contracting: [employer]  Social Needs  . Financial resource strain: Not on file  . Food insecurity:    Worry: Not on file    Inability: Not on file  . Transportation needs:    Medical: Not on file    Non-medical: Not on file  Tobacco Use  . Smoking status: Never Smoker  . Smokeless tobacco: Former Systems developer    Types: Chew  Substance and Sexual Activity  . Alcohol use: No    Comment: past drinker 2-3 daily and sometimes binge drinks on the weekend  . Drug use: No  . Sexual activity: Not on file  Lifestyle  . Physical activity:    Days per week: Not on file    Minutes per session: Not on file  . Stress: Not on file  Relationships  . Social connections:    Talks on phone: Not on file    Gets together: Not on file    Attends religious service: Not on file    Active member of  club or organization: Not on file    Attends meetings of clubs or organizations: Not on file    Relationship status: Not on file  . Intimate partner violence:    Fear of current or ex partner: Not on file    Emotionally abused: Not on file    Physically abused: Not on file    Forced sexual activity: Not on file  Other Topics Concern  . Not on file  Social History Narrative  . Not on file    Past Surgical History:  Procedure Laterality Date  . CORONARY STENT PLACEMENT     drug eluting  . HERNIA REPAIR    . INGUINAL HERNIA REPAIR  01/14/2012   Procedure: LAPAROSCOPIC INGUINAL HERNIA;  Surgeon: Ralene Ok, MD;  Location: [facility];  Service: General;  Laterality: Right;  . INSERTION OF MESH  01/14/2012   Procedure: INSERTION OF MESH;  Surgeon: Ralene Ok, MD;  Location: [facility];  Service: General;  Laterality: Right;   . KNEE SURGERY    . LEFT HEART CATHETERIZATION WITH CORONARY ANGIOGRAM N/A 10/21/2013   Procedure: LEFT HEART CATHETERIZATION WITH CORONARY ANGIOGRAM;  Surgeon: Blane Ohara, MD;  Location: [facility] C[facility] LAB;  Service: Cardiovascular;  Laterality: N/A;  . MEDIAL PARTIAL KNEE REPLACEMENT Left     Family History  Problem Relation Age of Onset  . Lung cancer Mother   . Hypertension Mother   . Diabetes Mother   . Hyperlipidemia Mother   . Emphysema Father   . Heart failure Father   . Heart attack Unknown   . Colon cancer Neg Hx   . Stroke Neg Hx   . Esophageal cancer Neg Hx   . Liver cancer Neg Hx   . Pancreatic cancer Neg Hx   . Stomach cancer Neg Hx   . Rectal cancer Neg Hx     No Known Allergies  Current Outpatient Medications on File Prior to Visit  Medication Sig Dispense Refill  . ACCU-CHEK AVIVA PLUS test strip CHECK BLOOD SUGARS ONCE PER DAY. DX: E11.9 100 each 4  . ALPRAZolam (XANAX) 0.5 MG tablet TAKE 1 TABLET BY MOUTH 3 TIMES A DAY AS NEEDED 60 tablet 2  . aspirin 81 MG tablet Take 81 mg by mouth every morning.     Marland Kitchen atorvastatin (LIPITOR) 20 MG tablet Take 1 tablet (20 mg total) daily by mouth. 90 tablet 3  . Azelaic Acid (FINACEA) 15 % cream Apply 1 application topically as directed. After skin is thoroughly washed and patted dry, gently but thoroughly massage a thin film of azelaic acid cream into the affected area twice daily, in the morning and evening. Applied to face.    . benazepril (LOTENSIN) 40 MG tablet TAKE 1 TABLET BY MOUTH EVERY DAY 90 tablet 2  . diclofenac sodium (VOLTAREN) 1 % GEL Apply 2 g topically 2 (two) times daily as needed. Apply to painful areas around knees    . glucose blood (ACCU-CHEK AVIVA) test strip Check Blood Sugars Once Per Day. DX: E11.9 100 each 1  . Lancets (ACCU-CHEK MULTICLIX) lancets CHECK BLOOD SUGARS ONCE PER DAY. DX: E11.9 102 each 1  . metFORMIN (GLUCOPHAGE-XR) 500 MG 24 hr tablet TAKE 2 TABLETS BY MOUTH TWICE A DAY 360 tablet 2   . Multiple Vitamin (MULTIVITAMIN WITH MINERALS) TABS Take 1 tablet by mouth daily.    . nitroGLYCERIN (NITROSTAT) 0.4 MG SL tablet DISSOLVE 1 TABLET BY MOUTH UNDER THE TONGUE EVERY 5 MINUTES AS NEEDED FOR CHEST PAIN  75 tablet 5  . OVER THE COUNTER MEDICATION Vitamin D 3. 1000 units one capsule daily.    . vitamin C (ASCORBIC ACID) 500 MG tablet Take 500 mg by mouth daily. Reported on 08/23/2015     Current Facility-Administered Medications on File Prior to Visit  Medication Dose Route Frequency Provider Last Rate Last Dose  . 0.9 %  sodium chloride infusion  500 mL Intravenous Once Milus Banister, MD        BP 140/88 (BP Location: Right Arm, Patient Position: Sitting, Cuff Size: Large)   Pulse 80   Temp 98.5 F (36.9 C) (Oral)   Wt 193 lb 6.4 oz (87.7 kg)   SpO2 97%   BMI 30.29 kg/m     Review of Systems  Constitutional: Negative for appetite change, chills, fatigue and fever.  HENT: Negative for congestion, dental problem, ear pain, hearing loss, sore throat, tinnitus, trouble swallowing and voice change.   Eyes: Negative for pain, discharge and visual disturbance.  Respiratory: Negative for cough, chest tightness, wheezing and stridor.   Cardiovascular: Negative for chest pain, palpitations and leg swelling.  Gastrointestinal: Negative for abdominal distention, abdominal pain, blood in stool, constipation, diarrhea, nausea and vomiting.  Genitourinary: Negative for difficulty urinating, discharge, flank pain, genital sores, hematuria and urgency.  Musculoskeletal: Negative for arthralgias, back pain, gait problem, joint swelling, myalgias and neck stiffness.  Skin: Negative for rash.  Neurological: Negative for dizziness, syncope, speech difficulty, weakness, numbness and headaches.  Hematological: Negative for adenopathy. Does not bruise/bleed easily.  Psychiatric/Behavioral: Negative for behavioral problems and dysphoric mood. The patient is nervous/anxious.         Objective:   Physical Exam  Constitutional: He is oriented to person, place, and time. He appears well-developed.  Blood pressure 140/80  HENT:  Head: Normocephalic.  Right Ear: External ear normal.  Left Ear: External ear normal.  Eyes: Conjunctivae and EOM are normal.  Neck: Normal range of motion.  Cardiovascular: Normal rate and normal heart sounds.  Pulmonary/Chest: Breath sounds normal.  Abdominal: Bowel sounds are normal.  Musculoskeletal: Normal range of motion. He exhibits no edema or tenderness.  Neurological: He is alert and oriented to person, place, and time.  Psychiatric: He has a normal mood and affect. His behavior is normal.          Assessment & Plan:   Essential hypertension.  Reasonable control but a bit labile Sinus tachycardia/anxiety disorder history of coronary artery disease.  Will place on bisoprolol 5 mg daily Coronary artery disease stable Diabetes mellitus type 2.  Will check a hemoglobin A1c  Follow-up 3 months Medications updated  Marletta Lor

## 2017-11-21 NOTE — Patient Instructions (Signed)
Limit your sodium (Salt) intake   Please check your hemoglobin A1c every 3 months    It is important that you exercise regularly, at least 20 minutes 3 to 4 times per week.  If you develop chest pain or shortness of breath seek  medical attention.   

## 2017-12-11 ENCOUNTER — Telehealth: Payer: Self-pay

## 2017-12-11 NOTE — Telephone Encounter (Signed)
Copied from [facility]. Topic: General - Other >> Dec 11, 2017  9:47 AM Janace Aris A wrote: Reason for CRM: Patient called in wanting to know if it was okay to still take the medication tadalafil (CIALIS) 20 MG tablet , it does show that it was discontinued.    Patient says if it is still okay he would like a refill sent to the pharmacy: [id number] - [place], [place] - [address]  [phone] (Phone) [phone] (Fax)   Please advise

## 2017-12-13 NOTE — Telephone Encounter (Signed)
Pt notified that Dr. [name] will be aware pt is wanting Rx. Rx will be ready upon approval.

## 2017-12-16 MED ORDER — TADALAFIL 20 MG PO TABS: 20.0000 mg | ORAL_TABLET | Freq: Every day | ORAL | 5 refills | Status: DC | PRN

## 2017-12-16 NOTE — Telephone Encounter (Signed)
Rx filled 20 tabs with 5 refills. No further action needed!

## 2017-12-16 NOTE — Telephone Encounter (Signed)
Okay to refill the Cialis 20 mg per dispense 20 tablets refills x5

## 2017-12-18 ENCOUNTER — Telehealth: Payer: Self-pay | Admitting: Internal Medicine

## 2017-12-18 NOTE — Telephone Encounter (Signed)
Pt aware of interactions and stated that the last time he had to take the Nitroglycerin was in 2012 after a heart attack. Pt stated he carries the bottle as he was told but hasn't touched it yet.

## 2017-12-18 NOTE — Telephone Encounter (Signed)
Copied from [place] [id number]. Topic: General - Other >> Dec 18, 2017  1:44 PM Keene Breath wrote: Reason for CRM: Mia with [facility] called to make the doctor aware that there was a drug interaction for the patient's nitroGLYCERIN (NITROSTAT) 0.4 MG SL tablet / and tadalafil (CIALIS) 20 MG tablet.  Pharmacy wanted to make sure that the doctor was aware.  Please advise.  CB# [phone]

## 2017-12-27 ENCOUNTER — Other Ambulatory Visit: Payer: Self-pay

## 2018-01-08 ENCOUNTER — Telehealth: Payer: Self-pay | Admitting: Cardiovascular Disease

## 2018-01-08 NOTE — Telephone Encounter (Signed)
° °  11/6 left message vcml to schedule with APP, schedule from recall- kw

## 2018-02-12 ENCOUNTER — Other Ambulatory Visit: Payer: Self-pay | Admitting: Internal Medicine

## 2018-02-20 ENCOUNTER — Ambulatory Visit: Payer: 59 | Admitting: Internal Medicine

## 2018-02-20 VITALS — BP 122/70 | HR 62 | Temp 97.8°F | Ht 67.0 in | Wt 194.1 lb

## 2018-02-20 DIAGNOSIS — Z23 Encounter for immunization: Secondary | ICD-10-CM | POA: Diagnosis not present

## 2018-02-20 DIAGNOSIS — I2510 Atherosclerotic heart disease of native coronary artery without angina pectoris: Secondary | ICD-10-CM

## 2018-02-20 DIAGNOSIS — Z86010 Personal history of colonic polyps: Secondary | ICD-10-CM

## 2018-02-20 DIAGNOSIS — K219 Gastro-esophageal reflux disease without esophagitis: Secondary | ICD-10-CM

## 2018-02-20 DIAGNOSIS — E1151 Type 2 diabetes mellitus with diabetic peripheral angiopathy without gangrene: Secondary | ICD-10-CM

## 2018-02-20 DIAGNOSIS — E785 Hyperlipidemia, unspecified: Secondary | ICD-10-CM

## 2018-02-20 DIAGNOSIS — I10 Essential (primary) hypertension: Secondary | ICD-10-CM | POA: Diagnosis not present

## 2018-02-20 LAB — POCT GLYCOSYLATED HEMOGLOBIN (HGB A1C): Hemoglobin A1C: 5.4 % (ref 4.0–5.6)

## 2018-02-20 MED ORDER — METFORMIN HCL ER 500 MG PO TB24: 1000.0000 mg | ORAL_TABLET | Freq: Two times a day (BID) | ORAL | 2 refills | Status: DC

## 2018-02-20 NOTE — Addendum Note (Signed)
Addended by: Modena Morrow R on: 02/20/2018 01:42 PM   Modules accepted: Orders

## 2018-02-20 NOTE — Progress Notes (Signed)
Established Patient Office Visit     CC/Reason for Visit: Establish care, medication refills, follow-up chronic medical conditions  HPI: Jose [name] is a 67 y.o. male who is coming in today for the above mentioned reasons.  He is due for his initial Medicare wellness visit.  Past Medical History is significant for: Hypertension that has been well controlled on bisoprolol and benazepril, type 2 diabetes well controlled on metformin, hyperlipidemia on atorvastatin 20 mg, he does have some mild anxiety for which he takes alprazolam pretty much on a daily basis once a day in the morning, has a history of GERD but has been off medication for a few months and has not had any symptoms, also has a history of colon polyps and is on a q. 5-year colonoscopy schedule, last done in July 2019.  He has no acute complaints at today's visit other than he needs a refill of metformin.   Past Medical/Surgical History: Past Medical History:  Diagnosis Date  . ALCOHOL ABUSE 07/27/2008  . Allergy   . ANXIETY 07/27/2008  . Arthritis   . Bifascicular block    beta blocker stopped due to profound bradycardia  . CAD 07/27/2008   a. s/p Endeavor DES to CFX 07/2008; residual OM1 70%, EF 45%;  b. relook cath 5/10: patent stent in CFX;  c.  LHC (8/15):  prox LAD 30%, OM1 50% at bifurcation, OM1 sub-branch 50%, mid AVCFX stent patent, EF 55-65% - no change from 2010 >>> Med Rx (after abnormal nuc)   . Cataract   . Diabetes mellitus without complication ([place])   . GERD 09/18/2006  . History of TIA (transient ischemic attack) may 2010  . Hx of adenomatous colonic polyps   . Hx of cardiovascular stress test    ETT-Myoview (09/2013):  Partially fixed defect with some reversibility - cannot r/o inf ischemia vs diaph atten, EF 51%; mod risk  . Hyperlipidemia   . HYPERTENSION 09/18/2006  . LOW BACK PAIN 09/18/2006  . Myocardial infarction ([place]) 2010  . Numbness and tingling    finger tips at times  . Rosacea 07/27/2008    . SEIZURE DISORDER 09/18/2006   none since 2008  . 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  . Urinary tract infection start cipro 01-07-2012    Past Surgical History:  Procedure Laterality Date  . CORONARY STENT PLACEMENT     drug eluting  . HERNIA REPAIR    . INGUINAL HERNIA REPAIR  01/14/2012   Procedure: LAPAROSCOPIC INGUINAL HERNIA;  Surgeon: Ralene Ok, MD;  Location: [facility];  Service: General;  Laterality: Right;  . INSERTION OF MESH  01/14/2012   Procedure: INSERTION OF MESH;  Surgeon: Ralene Ok, MD;  Location: [facility];  Service: General;  Laterality: Right;  . KNEE SURGERY    . LEFT HEART CATHETERIZATION WITH CORONARY ANGIOGRAM N/A 10/21/2013   Procedure: LEFT HEART CATHETERIZATION WITH CORONARY ANGIOGRAM;  Surgeon: Blane Ohara, MD;  Location: [facility] C[facility] LAB;  Service: Cardiovascular;  Laterality: N/A;  . MEDIAL PARTIAL KNEE REPLACEMENT Left     Social History:  reports that he has never smoked. He quit smokeless tobacco use about 6 years ago.  His smokeless tobacco use included chew. He reports that he does not drink alcohol or use drugs.  Allergies: No Known Allergies  Family History:  Family History  Problem Relation Age of Onset  . Lung cancer Mother   . Hypertension Mother   . Diabetes Mother   . Hyperlipidemia Mother   .  Emphysema Father   . Heart failure Father   . Heart attack Unknown   . Colon cancer Neg Hx   . Stroke Neg Hx   . Esophageal cancer Neg Hx   . Liver cancer Neg Hx   . Pancreatic cancer Neg Hx   . Stomach cancer Neg Hx   . Rectal cancer Neg Hx      Current Outpatient Medications:  .  ACCU-CHEK AVIVA PLUS test strip, CHECK BLOOD SUGARS ONCE PER DAY. DX: E11.9, Disp: 100 each, Rfl: 4 .  ALPRAZolam (XANAX) 0.5 MG tablet, Take 1 tablet (0.5 mg total) by mouth 3 (three) times daily as needed., Disp: 60 tablet, Rfl: 2 .  aspirin 81 MG tablet, Take 81 mg by mouth every morning. , Disp: , Rfl:  .  atorvastatin (LIPITOR) 20 MG tablet, Take 1 tablet  (20 mg total) by mouth daily., Disp: 90 tablet, Rfl: 3 .  Azelaic Acid (FINACEA) 15 % cream, Apply 1 application topically as directed. After skin is thoroughly washed and patted dry, gently but thoroughly massage a thin film of azelaic acid cream into the affected area twice daily, in the morning and evening. Applied to face., Disp: , Rfl:  .  benazepril (LOTENSIN) 40 MG tablet, Take 1 tablet (40 mg total) by mouth daily., Disp: 90 tablet, Rfl: 3 .  bisoprolol (ZEBETA) 5 MG tablet, Take 1 tablet (5 mg total) by mouth daily., Disp: 90 tablet, Rfl: 4 .  diclofenac sodium (VOLTAREN) 1 % GEL, Apply 2 g topically 2 (two) times daily as needed. Apply to painful areas around knees, Disp: , Rfl:  .  glucose blood (ACCU-CHEK AVIVA) test strip, Check Blood Sugars Once Per Day. DX: E11.9, Disp: 100 each, Rfl: 1 .  Lancets (ACCU-CHEK MULTICLIX) lancets, CHECK BLOOD SUGARS ONCE PER DAY. DX: E11.9, Disp: 102 each, Rfl: 1 .  metFORMIN (GLUCOPHAGE-XR) 500 MG 24 hr tablet, Take 2 tablets (1,000 mg total) by mouth 2 (two) times daily., Disp: 360 tablet, Rfl: 2 .  Multiple Vitamin (MULTIVITAMIN WITH MINERALS) TABS, Take 1 tablet by mouth daily., Disp: , Rfl:  .  nitroGLYCERIN (NITROSTAT) 0.4 MG SL tablet, DISSOLVE 1 TABLET BY MOUTH UNDER THE TONGUE EVERY 5 MINUTES AS NEEDED FOR CHEST PAIN, Disp: 75 tablet, Rfl: 5 .  OVER THE COUNTER MEDICATION, Vitamin D 3. 1000 units one capsule daily., Disp: , Rfl:  .  vitamin C (ASCORBIC ACID) 500 MG tablet, Take 500 mg by mouth daily. Reported on 08/23/2015, Disp: , Rfl:  .  tadalafil (CIALIS) 20 MG tablet, Take 1 tablet (20 mg total) by mouth daily as needed for erectile dysfunction., Disp: 20 tablet, Rfl: 5  Current Facility-Administered Medications:  .  0.9 %  sodium chloride infusion, 500 mL, Intravenous, Once, Milus Banister, MD  Review of Systems:  Constitutional: Denies fever, chills, diaphoresis, appetite change and fatigue.  HEENT: Denies photophobia, eye pain,  redness, hearing loss, ear pain, congestion, sore throat, rhinorrhea, sneezing, mouth sores, trouble swallowing, neck pain, neck stiffness and tinnitus.   Respiratory: Denies SOB, DOE, cough, chest tightness,  and wheezing.   Cardiovascular: Denies chest pain, palpitations and leg swelling.  Gastrointestinal: Denies nausea, vomiting, abdominal pain, diarrhea, constipation, blood in stool and abdominal distention.  Genitourinary: Denies dysuria, urgency, frequency, hematuria, flank pain and difficulty urinating.  Endocrine: Denies: hot or cold intolerance, sweats, changes in hair or nails, polyuria, polydipsia. Musculoskeletal: Denies myalgias, back pain, joint swelling, arthralgias and gait problem.  Skin: Denies pallor, rash and  wound.  Neurological: Denies dizziness, seizures, syncope, weakness, light-headedness, numbness and headaches.  Hematological: Denies adenopathy. Easy bruising, personal or family bleeding history  Psychiatric/Behavioral: Denies suicidal ideation, mood changes, confusion, nervousness, sleep disturbance and agitation    Physical Exam: Vitals:   02/20/18 1131  BP: 122/70  Pulse: 62  Temp: 97.8 F (36.6 C)  TempSrc: Oral  SpO2: 98%  Weight: 194 lb 1.6 oz (88 kg)  Height: 5\' 7"  (1.702 m)    Body mass index is 30.4 kg/m.   Constitutional: NAD, calm, comfortable Eyes: PERRL, lids and conjunctivae normal ENMT: Mucous membranes are moist.  Respiratory: clear to auscultation bilaterally, no wheezing, no crackles. Normal respiratory effort. No accessory muscle use.  Cardiovascular: Regular rate and rhythm, no murmurs / rubs / gallops. No extremity edema. 2+ pedal pulses. No carotid bruits.   Musculoskeletal: no clubbing / cyanosis. No joint deformity upper and lower extremities. Good ROM, no contractures. Normal muscle tone.  Skin: no rashes, lesions, ulcers. No induration Neurologic: Grossly intact and nonfocal Psychiatric: Normal judgment and insight. Alert  and oriented x 3. Normal mood.    Impression and Plan:  Dyslipidemia -On statin therapy, last LDL documented was 95 in June 2016. -When he returns for his Medicare wellness visit will recheck lipids.  Diabetes mellitus with peripheral vascular disease ([place]) -Extremely well controlled, A1c today is 5.4. -Continue metformin.  Essential hypertension -Well-controlled on benazepril and bisoprolol.  Atherosclerosis of native coronary artery of native heart without angina pectoris -No chest pain, shortness of breath. -On aspirin, statin, beta-blocker.  Gastroesophageal reflux disease without esophagitis -Off PPI for a few months without issues.  Personal history of colonic polyps -Currently receiving colonoscopies every 5 years, most recently in July 2019.  Will receive pneumonia vaccination today.    Patient Instructions  -It was nice meeting you today!  -Pneumonia vaccine today.  -Schedule follow up in 3-4 months for your initial medicare wellness visit. Please come in fasting that day.     Lelon Frohlich, MD [facility]

## 2018-02-20 NOTE — Patient Instructions (Signed)
-  It was nice meeting you today!  -Pneumonia vaccine today.  -Schedule follow up in 3-4 months for your initial medicare wellness visit. Please come in fasting that day.

## 2018-05-19 ENCOUNTER — Other Ambulatory Visit: Payer: Self-pay | Admitting: Internal Medicine

## 2018-05-27 ENCOUNTER — Ambulatory Visit: Payer: 59 | Admitting: Internal Medicine

## 2018-06-17 ENCOUNTER — Other Ambulatory Visit: Payer: Self-pay | Admitting: Internal Medicine

## 2018-07-02 ENCOUNTER — Telehealth: Payer: Self-pay | Admitting: Internal Medicine

## 2018-07-02 NOTE — Telephone Encounter (Signed)
LMVM to reschedule his appointment to a later time that day because our hours have changed to 8-4. Also need to make the appointment a virtual appointment.

## 2018-07-09 ENCOUNTER — Ambulatory Visit: Payer: 59 | Admitting: Internal Medicine

## 2018-07-10 ENCOUNTER — Ambulatory Visit (INDEPENDENT_AMBULATORY_CARE_PROVIDER_SITE_OTHER): Payer: 59 | Admitting: Internal Medicine

## 2018-07-10 ENCOUNTER — Other Ambulatory Visit: Payer: Self-pay

## 2018-07-10 DIAGNOSIS — E1151 Type 2 diabetes mellitus with diabetic peripheral angiopathy without gangrene: Secondary | ICD-10-CM

## 2018-07-10 DIAGNOSIS — K219 Gastro-esophageal reflux disease without esophagitis: Secondary | ICD-10-CM

## 2018-07-10 DIAGNOSIS — I10 Essential (primary) hypertension: Secondary | ICD-10-CM | POA: Diagnosis not present

## 2018-07-10 DIAGNOSIS — E785 Hyperlipidemia, unspecified: Secondary | ICD-10-CM

## 2018-07-10 NOTE — Progress Notes (Signed)
Virtual Visit via Video Note  I connected with Particia [name] on 07/10/18 at 11:30 AM EDT by a video enabled telemedicine application and verified that I am speaking with the correct person using two identifiers.  Location patient: home Location provider: work office Persons participating in the virtual visit: patient, provider  I discussed the limitations of evaluation and management by telemedicine and the availability of in person appointments. The patient expressed understanding and agreed to proceed.   HPI: This is a scheduled visit for follow up of chronic conditions. PMH is significant for: Hypertension that has been well controlled on bisoprolol and benazepril, type 2 diabetes well controlled on metformin, hyperlipidemia on atorvastatin 20 mg, he does have some mild anxiety for which he takes alprazolam pretty much on a daily basis once a day in the morning, has a history of GERD but has been off medication for a few months and has not had any symptoms, also has a history of colon polyps and is on a q. 5-year colonoscopy schedule, last done in July 2019.   He has been doing well since our last visit. Has been checking BP at home with ranges between 113-136/70/81, CBGs have ranged between 102-120. He is still working.  No acute complaints today. He is due for his initial medicare wellness visit   ROS: Constitutional: Denies fever, chills, diaphoresis, appetite change and fatigue.  HEENT: Denies photophobia, eye pain, redness, hearing loss, ear pain, congestion, sore throat, rhinorrhea, sneezing, mouth sores, trouble swallowing, neck pain, neck stiffness and tinnitus.   Respiratory: Denies SOB, DOE, cough, chest tightness,  and wheezing.   Cardiovascular: Denies chest pain, palpitations and leg swelling.  Gastrointestinal: Denies nausea, vomiting, abdominal pain, diarrhea, constipation, blood in stool and abdominal distention.  Genitourinary: Denies dysuria, urgency, frequency,  hematuria, flank pain and difficulty urinating.  Endocrine: Denies: hot or cold intolerance, sweats, changes in hair or nails, polyuria, polydipsia. Musculoskeletal: Denies myalgias, back pain, joint swelling, arthralgias and gait problem.  Skin: Denies pallor, rash and wound.  Neurological: Denies dizziness, seizures, syncope, weakness, light-headedness, numbness and headaches.  Hematological: Denies adenopathy. Easy bruising, personal or family bleeding history  Psychiatric/Behavioral: Denies suicidal ideation, mood changes, confusion, nervousness, sleep disturbance and agitation   Past Medical History:  Diagnosis Date  . ALCOHOL ABUSE 07/27/2008  . Allergy   . ANXIETY 07/27/2008  . Arthritis   . Bifascicular block    beta blocker stopped due to profound bradycardia  . CAD 07/27/2008   a. s/p Endeavor DES to CFX 07/2008; residual OM1 70%, EF 45%;  b. relook cath 5/10: patent stent in CFX;  c.  LHC (8/15):  prox LAD 30%, OM1 50% at bifurcation, OM1 sub-branch 50%, mid AVCFX stent patent, EF 55-65% - no change from 2010 >>> Med Rx (after abnormal nuc)   . Cataract   . Diabetes mellitus without complication ([place])   . GERD 09/18/2006  . History of TIA (transient ischemic attack) may 2010  . Hx of adenomatous colonic polyps   . Hx of cardiovascular stress test    ETT-Myoview (09/2013):  Partially fixed defect with some reversibility - cannot r/o inf ischemia vs diaph atten, EF 51%; mod risk  . Hyperlipidemia   . HYPERTENSION 09/18/2006  . LOW BACK PAIN 09/18/2006  . Myocardial infarction ([place]) 2010  . Numbness and tingling    finger tips at times  . Rosacea 07/27/2008  . SEIZURE DISORDER 09/18/2006   none since 2008  . 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  .  Urinary tract infection start cipro 01-07-2012    Past Surgical History:  Procedure Laterality Date  . CORONARY STENT PLACEMENT     drug eluting  . HERNIA REPAIR    . INGUINAL HERNIA REPAIR  01/14/2012   Procedure: LAPAROSCOPIC INGUINAL  HERNIA;  Surgeon: Ralene Ok, MD;  Location: [facility];  Service: General;  Laterality: Right;  . INSERTION OF MESH  01/14/2012   Procedure: INSERTION OF MESH;  Surgeon: Ralene Ok, MD;  Location: [facility];  Service: General;  Laterality: Right;  . KNEE SURGERY    . LEFT HEART CATHETERIZATION WITH CORONARY ANGIOGRAM N/A 10/21/2013   Procedure: LEFT HEART CATHETERIZATION WITH CORONARY ANGIOGRAM;  Surgeon: Blane Ohara, MD;  Location[facility] C[facility] LAB;  Service: Cardiovascular;  Laterality: N/A;  . MEDIAL PARTIAL KNEE REPLACEMENT Left     Family History  Problem Relation Age of Onset  . Lung cancer Mother   . Hypertension Mother   . Diabetes Mother   . Hyperlipidemia Mother   . Emphysema Father   . Heart failure Father   . Heart attack Unknown   . Colon cancer Neg Hx   . Stroke Neg Hx   . Esophageal cancer Neg Hx   . Liver cancer Neg Hx   . Pancreatic cancer Neg Hx   . Stomach cancer Neg Hx   . Rectal cancer Neg Hx     SOCIAL HX:   reports that he has never smoked. He quit smokeless tobacco use about 6 years ago.  His smokeless tobacco use included chew. He reports that he does not drink alcohol or use drugs.   Current Outpatient Medications:  .  ACCU-CHEK AVIVA PLUS test strip, CHECK BLOOD SUGARS ONCE PER DAY. DX: E11.9, Disp: 100 each, Rfl: 4 .  ALPRAZolam (XANAX) 0.5 MG tablet, TAKE 1 TABLET (0.5 MG TOTAL) BY MOUTH 3 (THREE) TIMES DAILY AS NEEDED., Disp: 60 tablet, Rfl: 1 .  aspirin 81 MG tablet, Take 81 mg by mouth every morning. , Disp: , Rfl:  .  atorvastatin (LIPITOR) 20 MG tablet, Take 1 tablet (20 mg total) by mouth daily., Disp: 90 tablet, Rfl: 3 .  Azelaic Acid (FINACEA) 15 % cream, Apply 1 application topically as directed. After skin is thoroughly washed and patted dry, gently but thoroughly massage a thin film of azelaic acid cream into the affected area twice daily, in the morning and evening. Applied to face., Disp: , Rfl:  .  benazepril (LOTENSIN) 40 MG tablet,  Take 1 tablet (40 mg total) by mouth daily., Disp: 90 tablet, Rfl: 3 .  bisoprolol (ZEBETA) 5 MG tablet, Take 1 tablet (5 mg total) by mouth daily., Disp: 90 tablet, Rfl: 4 .  diclofenac sodium (VOLTAREN) 1 % GEL, Apply 2 g topically 2 (two) times daily as needed. Apply to painful areas around knees, Disp: , Rfl:  .  glucose blood (ACCU-CHEK AVIVA) test strip, Check Blood Sugars Once Per Day. DX: E11.9, Disp: 100 each, Rfl: 1 .  Lancets (ACCU-CHEK MULTICLIX) lancets, CHECK BLOOD SUGARS ONCE PER DAY. DX: E11.9, Disp: 102 each, Rfl: 1 .  metFORMIN (GLUCOPHAGE-XR) 500 MG 24 hr tablet, Take 2 tablets (1,000 mg total) by mouth 2 (two) times daily., Disp: 360 tablet, Rfl: 2 .  Multiple Vitamin (MULTIVITAMIN WITH MINERALS) TABS, Take 1 tablet by mouth daily., Disp: , Rfl:  .  nitroGLYCERIN (NITROSTAT) 0.4 MG SL tablet, DISSOLVE 1 TABLET BY MOUTH UNDER THE TONGUE EVERY 5 MINUTES AS NEEDED FOR CHEST PAIN, Disp: 75  tablet, Rfl: 5 .  OVER THE COUNTER MEDICATION, Vitamin D 3. 1000 units one capsule daily., Disp: , Rfl:  .  tadalafil (CIALIS) 20 MG tablet, Take 1 tablet (20 mg total) by mouth daily as needed for erectile dysfunction., Disp: 20 tablet, Rfl: 5 .  vitamin C (ASCORBIC ACID) 500 MG tablet, Take 500 mg by mouth daily. Reported on 08/23/2015, Disp: , Rfl:   Current Facility-Administered Medications:  .  0.9 %  sodium chloride infusion, 500 mL, Intravenous, Once, Milus Banister, MD  EXAM:   VITALS per patient if applicable: BP of 062/37 this am  GENERAL: alert, oriented, appears well and in no acute distress  HEENT: atraumatic, conjunttiva clear, no obvious abnormalities on inspection of external nose and ears  NECK: normal movements of the head and neck  LUNGS: on inspection no signs of respiratory distress, breathing rate appears normal, no obvious gross increased work of breathing, gasping or wheezing  CV: no obvious cyanosis  MS: moves all visible extremities without noticeable  abnormality  PSYCH/NEURO: pleasant and cooperative, no obvious depression or anxiety, speech and thought processing grossly intact  ASSESSMENT AND PLAN:   Dyslipidemia -On statin. -Last LDL was 95 in 2016. -Recheck lipids next in-person visit.  Essential hypertension -Appears to be well controlled.  Diabetes mellitus with peripheral vascular disease ([place]) -Well controlled. -A1c in 12/19 was 5.4, CBGs at home between 102-120. -Recheck A1c at next in-person visit.  Gastroesophageal reflux disease without esophagitis -Asymptomatic off of PPI therapy.     I discussed the assessment and treatment plan with the patient. The patient was provided an opportunity to ask questions and all were answered. The patient agreed with the plan and demonstrated an understanding of the instructions.   The patient was advised to call back or seek an in-person evaluation if the symptoms worsen or if the condition fails to improve as anticipated.    Lelon Frohlich, MD  [facility] at [facility]

## 2018-07-22 ENCOUNTER — Telehealth: Payer: Self-pay

## 2018-07-22 NOTE — Telephone Encounter (Signed)
Scheduled patient for overdue visit with Dr. Burt Knack tomorrow. Consent obtained for video visit. He will have VS available for precall.      Virtual Visit Pre-Appointment Phone Call 1. Confirm consent - "In the setting of the current Covid19 crisis, you are scheduled for a (phone or video) visit with your provider on (date) at (time).  Just as we do with many in-office visits, in order for you to participate in this visit, we must obtain consent.  If you'd like, I can send this to your mychart (if signed up) or email for you to review.  Otherwise, I can obtain your verbal consent now.  All virtual visits are billed to your insurance company just like a normal visit would be.  By agreeing to a virtual visit, we'd like you to understand that the technology does not allow for your provider to perform an examination, and thus may limit your provider's ability to fully assess your condition. If your provider identifies any concerns that need to be evaluated in person, we will make arrangements to do so.  Finally, though the technology is pretty good, we cannot assure that it will always work on either your or our end, and in the setting of a video visit, we may have to convert it to a phone-only visit.  In either situation, we cannot ensure that we have a secure connection.  Are you willing to proceed?" STAFF: Did the patient verbally acknowledge consent to telehealth visit? Document YES/NO here: YES   TELEPHONE CALL NOTE  SOSTENES KAUFFMANN has been deemed a candidate for a follow-up tele-health visit to limit community exposure during the Covid-19 pandemic. I spoke with the patient via phone to ensure availability of phone/video source, confirm preferred email & phone number, and discuss instructions and expectations.  I reminded RILLEY STASH to be prepared with any vital sign and/or heart rhythm information that could potentially be obtained via home monitoring, at the time of his visit. I reminded YUE GLASHEEN  to expect a phone call prior to his visit.   IF USING DOXIMITY or [email or web address] - The patient will receive a link just prior to their visit by text.

## 2018-07-23 ENCOUNTER — Telehealth (INDEPENDENT_AMBULATORY_CARE_PROVIDER_SITE_OTHER): Payer: 59 | Admitting: Cardiovascular Disease

## 2018-07-23 ENCOUNTER — Encounter: Payer: Self-pay | Admitting: Cardiovascular Disease

## 2018-07-23 ENCOUNTER — Other Ambulatory Visit: Payer: Self-pay

## 2018-07-23 VITALS — BP 111/72 | HR 74 | Ht 67.0 in | Wt 181.0 lb

## 2018-07-23 DIAGNOSIS — E782 Mixed hyperlipidemia: Secondary | ICD-10-CM

## 2018-07-23 DIAGNOSIS — I2510 Atherosclerotic heart disease of native coronary artery without angina pectoris: Secondary | ICD-10-CM

## 2018-07-23 DIAGNOSIS — I10 Essential (primary) hypertension: Secondary | ICD-10-CM

## 2018-07-23 DIAGNOSIS — I452 Bifascicular block: Secondary | ICD-10-CM

## 2018-07-23 NOTE — Progress Notes (Signed)
Virtual Visit via Video Note   This visit type was conducted due to national recommendations for restrictions regarding the COVID-19 Pandemic (e.g. social distancing) in an effort to limit this patient's exposure and mitigate transmission in our community.  Due to his co-morbid illnesses, this patient is at least at moderate risk for complications without adequate follow up.  This format is felt to be most appropriate for this patient at this time.  All issues noted in this document were discussed and addressed.  A limited physical exam was performed with this format.  Please refer to the patient's chart for his consent to telehealth for [facility].   Date:  07/23/2018   ID:  Jose [name], DOB 08-02-1950, MRN [id number]  Patient Location: Home Provider Location: Home  PCP:  [name], [name], MD  Cardiologist:  No primary care provider on file.  Electrophysiologist:  None   Evaluation Performed:  Follow-Up Visit  Chief Complaint:  Follow-up CAD  History of Present Illness:    Jose [name] is a 68 y.o. male with a hx of CAD and remote stenting of the left circumflex with a drug eluting stent. He's had no evidence of ischemia over the past several years.   The patient does not have symptoms concerning for COVID-19 infection (fever, chills, cough, or new shortness of breath).   The patient has been doing fine from a cardiac perspective.  He denies chest pain, chest pressure, shortness of breath, heart palpitations, lightheadedness, or syncope.  He has had no edema, orthopnea, or PND.  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  He has been working long hours and complains of some generalized fatigue that he relates to long work hours.  No specific complaints related to his heart condition.  He is limited by knee pain and plans to have knee replacement surgery done late this calendar year.   Past Medical  History:  Diagnosis Date  . ALCOHOL ABUSE 07/27/2008  . Allergy   . ANXIETY 07/27/2008  . Arthritis   . Bifascicular block    beta blocker stopped due to profound bradycardia  . CAD 07/27/2008   a. s/p Endeavor DES to CFX 07/2008; residual OM1 70%, EF 45%;  b. relook cath 5/10: patent stent in CFX;  c.  LHC (8/15):  prox LAD 30%, OM1 50% at bifurcation, OM1 sub-branch 50%, mid AVCFX stent patent, EF 55-65% - no change from 2010 >>> Med Rx (after abnormal nuc)   . Cataract   . Diabetes mellitus without complication ([place])   . GERD 09/18/2006  . History of TIA (transient ischemic attack) may 2010  . Hx of adenomatous colonic polyps   . Hx of cardiovascular stress test    ETT-Myoview (09/2013):  Partially fixed defect with some reversibility - cannot r/o inf ischemia vs diaph atten, EF 51%; mod risk  . Hyperlipidemia   . HYPERTENSION 09/18/2006  . LOW BACK PAIN 09/18/2006  . Myocardial infarction ([place]) 2010  . Numbness and tingling    finger tips at times  . Rosacea 07/27/2008  . SEIZURE DISORDER 09/18/2006   none since 2008  . 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  . Urinary tract infection start cipro 01-07-2012   Past Surgical History:  Procedure Laterality Date  . CORONARY STENT PLACEMENT     drug eluting  . HERNIA REPAIR    . INGUINAL HERNIA REPAIR  01/14/2012   Procedure: LAPAROSCOPIC INGUINAL HERNIA;  Surgeon:  Ralene Ok, MD;  Location: [facility];  Service: General;  Laterality: Right;  . INSERTION OF MESH  01/14/2012   Procedure: INSERTION OF MESH;  Surgeon: Ralene Ok, MD;  Location: [facility];  Service: General;  Laterality: Right;  . KNEE SURGERY    . LEFT HEART CATHETERIZATION WITH CORONARY ANGIOGRAM N/A 10/21/2013   Procedure: LEFT HEART CATHETERIZATION WITH CORONARY ANGIOGRAM;  Surgeon: Blane Ohara, MD;  Location: [facility] CATH LAB;  Service: Cardiovascular;  Laterality: N/A;  . MEDIAL PARTIAL KNEE REPLACEMENT Left      Current Meds  Medication Sig  . ACCU-CHEK AVIVA PLUS test  strip CHECK BLOOD SUGARS ONCE PER DAY. DX: E11.9  . ALPRAZolam (XANAX) 0.5 MG tablet TAKE 1 TABLET (0.5 MG TOTAL) BY MOUTH 3 (THREE) TIMES DAILY AS NEEDED.  Marland Kitchen aspirin 81 MG tablet Take 81 mg by mouth every morning.   Marland Kitchen atorvastatin (LIPITOR) 20 MG tablet Take 1 tablet (20 mg total) by mouth daily.  . Azelaic Acid (FINACEA) 15 % cream Apply 1 application topically as directed. After skin is thoroughly washed and patted dry, gently but thoroughly massage a thin film of azelaic acid cream into the affected area twice daily, in the morning and evening. Applied to face.  . benazepril (LOTENSIN) 40 MG tablet Take 1 tablet (40 mg total) by mouth daily.  . bisoprolol (ZEBETA) 5 MG tablet Take 1 tablet (5 mg total) by mouth daily.  . diclofenac sodium (VOLTAREN) 1 % GEL Apply 2 g topically 2 (two) times daily as needed. Apply to painful areas around knees  . glucose blood (ACCU-CHEK AVIVA) test strip Check Blood Sugars Once Per Day. DX: E11.9  . Lancets (ACCU-CHEK MULTICLIX) lancets CHECK BLOOD SUGARS ONCE PER DAY. DX: E11.9  . metFORMIN (GLUCOPHAGE-XR) 500 MG 24 hr tablet Take 2 tablets (1,000 mg total) by mouth 2 (two) times daily.  . Multiple Vitamin (MULTIVITAMIN WITH MINERALS) TABS Take 1 tablet by mouth daily.  . nitroGLYCERIN (NITROSTAT) 0.4 MG SL tablet DISSOLVE 1 TABLET BY MOUTH UNDER THE TONGUE EVERY 5 MINUTES AS NEEDED FOR CHEST PAIN  . OVER THE COUNTER MEDICATION Vitamin D 3. 1000 units one capsule daily.  . tadalafil (CIALIS) 20 MG tablet Take 1 tablet (20 mg total) by mouth daily as needed for erectile dysfunction.  . vitamin C (ASCORBIC ACID) 500 MG tablet Take 500 mg by mouth daily. Reported on 08/23/2015   Current Facility-Administered Medications for the 07/23/18 encounter (Telemedicine) with [name], MD  Medication  . 0.9 %  sodium chloride infusion     Allergies:   Patient has no known allergies.   Social History   Tobacco Use  . Smoking status: Never Smoker  . Smokeless  tobacco: Former Systems developer    Types: Chew  Substance Use Topics  . Alcohol use: No    Comment: past drinker 2-3 daily and sometimes binge drinks on the weekend  . Drug use: No     Family Hx: The patient's family history includes Diabetes in his mother; Emphysema in his father; Heart attack in his unknown relative; Heart failure in his father; Hyperlipidemia in his mother; Hypertension in his mother; Lung cancer in his mother. There is no history of Colon cancer, Stroke, Esophageal cancer, Liver cancer, Pancreatic cancer, Stomach cancer, or Rectal cancer.  ROS:   Please see the history of present illness.    All other systems reviewed and are negative.  Prior CV studies:   The following studies were reviewed today:  Cardiac  Cath 10-21-2013: Cardiac Catheterization Procedure Note  Name: QUINNTON BURY MRN: [id number] DOB: 24-Nov-1950  Procedure: Left Heart Cath, Selective Coronary Angiography, LV angiography  Indication: Shortness of breath, moderate risk stress Myoview.                                    Procedural Details: The right wrist was prepped, draped, and anesthetized with 1% lidocaine. Using the modified Seldinger technique, a 5/6 French Slender sheath was introduced into the right radial artery. 3 mg of verapamil was administered through the sheath, weight-based unfractionated heparin was administered intravenously. Standard Judkins catheters were used for selective coronary angiography and left ventriculography. Catheter exchanges were performed over an exchange length guidewire. There were no immediate procedural complications. A TR band was used for radial hemostasis at the completion of the procedure.  The patient was transferred to the post catheterization recovery area for further monitoring.  Procedural Findings: Hemodynamics: AO 139/66 LV 138/5  Coronary angiography: Coronary dominance: Left  Left mainstem: The left main stem is widely patent with no obstructive  disease. The vessel divides into the LAD and left circumflex.  Left anterior descending (LAD): The LAD is patent to the apex of the heart. The vessel has minor luminal irregularity. There is an eccentric plaque in the proximal LAD with about 30% associated stenosis. There is no other significant disease noted throughout the distribution of the LAD. The diagonal branches are patent.  Left circumflex (LCx): The left circumflex is a large, dominant vessel. The proximal circumflex is patent with minor narrowing at the ostium. The first obtuse marginal branch divides into twin vessels. At its bifurcation point, there is a 50% stenosis leading into the main branch. This is unchanged from the 2010 study. The ostium of the subbranch also has 50% stenosis, unchanged from the previous study. The AV circumflex in the midportion is stented. There is no significant in-stent restenosis. This leads into a left PDA branch.  Right coronary artery (RCA): The right coronary artery is medium in caliber. The vessel supplies 2 RV marginal branches without significant stenosis.  Left ventriculography: Left ventricular systolic function is normal, LVEF is estimated at 55-65%, there is no significant mitral regurgitation   Contrast: 90 mL of Omnipaque  Radiation dose/Fluoro time: 3.2 minutes  Estimated Blood Loss: Minimal  Final Conclusions:   1. Single-vessel coronary artery disease with continued patency of the stented segment in the mid left circumflex and mild to moderate stenosis of the first obtuse marginal branch 2. Mild nonobstructive LAD stenosis 3. Small, nondominant RCA 4. Normal LV systolic function  Recommendations: The patient's films were compared to his previous study from 2010. There is no change in coronary anatomy and there clearly is stability without any significant progression of obstructive CAD. Recommend ongoing risk reduction measures.  [name] MD, [facility] 10/21/2013, 9:12 AM   Labs/Other Tests and Data Reviewed:    EKG:  An ECG dated 01/10/2017 was personally reviewed today and demonstrated:  Normal sinus rhythm with right bundle branch block and left anterior fascicular block.  Recent Labs: No results found for requested labs within last 8760 hours.   Recent Lipid Panel Lab Results  Component Value Date/Time   CHOL 164 08/05/2015 11:34 AM   TRIG 154.0 (H) 08/05/2015 11:34 AM   HDL 38.30 (L) 08/05/2015 11:34 AM   CHOLHDL 4 08/05/2015 11:34 AM   LDLCALC 95 08/05/2015 11:34 AM  LDLDIRECT 163.3 07/09/2007 08:18 AM    Wt Readings from Last 3 Encounters:  07/23/18 181 lb (82.1 kg)  02/20/18 194 lb 1.6 oz (88 kg)  11/21/17 193 lb 6.4 oz (87.7 kg)     Objective:    Vital Signs:  BP 111/72   Pulse 74   Ht 5\' 7"  (1.702 m)   Wt 181 lb (82.1 kg)   BMI 28.35 kg/m    VITAL SIGNS:  reviewed The patient is alert, oriented, in no distress.  He is breathing comfortably during normal conversation.  Remaining exam deferred as this is a virtual/telehealth visit  ASSESSMENT & PLAN:    1. Coronary artery disease, native vessel, without angina: The patient is stable from a cardiac perspective.  He is having no anginal symptoms.  I have reviewed his last heart catheterization from 2015 that was performed after an abnormal nuclear study.  This demonstrated continued patency of his left circumflex stent site and moderate nonobstructive disease in the obtuse marginal branch.  Other coronary vessels were widely patent with minor nonobstructive disease and his LV function was normal.  He will continue on his current medical program without change. 2. Mixed hyperlipidemia: Lipids to be drawn next month by primary care.  Treated with a statin drug. 3. Essential hypertension: Blood pressure well controlled based on review of his recent home readings.  He has a history of whitecoat hypertension.  He will continue on his same medical program. 4. Bifascicular block: The patient is  noted a decrease in his heart rate but he remains in a normal range with a heart rate averaging around 70 bpm based on home readings.  He has no lightheadedness or dizziness.  No presyncope or frank syncope.  Continue observation and repeat an EKG when he returns for follow-up in 1 year.  COVID-19 Education: The signs and symptoms of COVID-19 were discussed with the patient and how to seek care for testing (follow up with PCP or arrange E-visit). The importance of social distancing was discussed today.  Time:   Today, I have spent 15 minutes with the patient with telehealth technology discussing the above problems.     Medication Adjustments/Labs and Tests Ordered: Current medicines are reviewed at length with the patient today.  Concerns regarding medicines are outlined above.   Tests Ordered: No orders of the defined types were placed in this encounter.   Medication Changes: No orders of the defined types were placed in this encounter.   Disposition:  Follow up in 1 year(s)  Signed, [name], MD  07/23/2018 4:15 PM    [facility]

## 2018-09-08 ENCOUNTER — Telehealth: Payer: Self-pay | Admitting: Internal Medicine

## 2018-09-08 NOTE — Telephone Encounter (Signed)
Medication Refill - Medication: ALPRAZolam (XANAX) 0.5 MG tablet, One Touch Vero test strips, nitroGLYCERIN (NITROSTAT) 0.4 MG SL tablet, Azelaic Acid (FINACEA) 15 % cream   Has the patient contacted their pharmacy? Yes.   (Agent: If no, request that the patient contact the pharmacy for the refill.) (Agent: If yes, when and what did the pharmacy advise?)  Preferred Pharmacy (with phone number or street name):  [id number] [place] [place]. [address]  [address] [facility] [place] [address]  Phone: [phone] Fax: [phone]   Agent: Please be advised that RX refills may take up to 3 business days. We ask that you follow-up with your pharmacy.

## 2018-09-16 ENCOUNTER — Other Ambulatory Visit: Payer: Self-pay | Admitting: Internal Medicine

## 2018-09-16 ENCOUNTER — Telehealth: Payer: Self-pay | Admitting: Internal Medicine

## 2018-09-16 NOTE — Telephone Encounter (Signed)
Pt called and stated that he needs one touch test strips and face cream called in. Pharmacy received acu check test trips.

## 2018-09-16 NOTE — Telephone Encounter (Signed)
Medication Refill - Medication: ACCU-CHEK AVIVA PLUS test strip [[id number]]   Azelaic Acid (FINACEA) 15 % cream [[id number]]    Has the patient contacted their pharmacy? No. (Agent: If no, request that the patient contact the pharmacy for the refill.) The patient needs a new prescription for these medications. He requested them last week and he has been out of his test strips for over a week. He needs this urgently.   Preferred Pharmacy (with phone number or street name):  [id number] [place] [place]. [address] [phone] (Phone) [phone] (Fax)     Agent: Please be advised that RX refills may take up to 3 business days. We ask that you follow-up with your pharmacy.

## 2018-09-17 MED ORDER — AZELAIC ACID 15 % EX GEL: 1.0000 "application " | CUTANEOUS | 0 refills | Status: DC

## 2018-09-17 MED ORDER — ACCU-CHEK AVIVA PLUS VI STRP: ORAL_STRIP | 4 refills | Status: DC

## 2018-09-18 NOTE — Telephone Encounter (Signed)
Error

## 2018-09-19 ENCOUNTER — Ambulatory Visit: Payer: Self-pay

## 2018-09-19 MED ORDER — ONETOUCH ULTRA VI STRP: 1.0000 | ORAL_STRIP | 11 refills | Status: DC

## 2018-09-19 NOTE — Addendum Note (Signed)
Addended by: Westley Hummer B on: 09/19/2018 03:53 PM   Modules accepted: Orders

## 2018-09-19 NOTE — Telephone Encounter (Signed)
Pt called in, upset that his test strips are still not correct.  He needs to have one touch strips called in.  [facility] [place]   Pt has not checked his blood sugar in 2 weeks because of not having the right strips.   Please fill as soon as possible

## 2018-09-19 NOTE — Telephone Encounter (Signed)
Left message on machine for patient. Which onetouch strip? How often does he test?  CRM

## 2018-09-19 NOTE — Telephone Encounter (Signed)
Rx sent 

## 2018-09-19 NOTE — Telephone Encounter (Signed)
Incoming call from  Patient.  Returning a call. Patient states that he use Ultra one touch.  Checks his blood sugar once a day. , in the morning.   [facility] [place] [facility] [address].  Can be reached at [phone] ext. [place]

## 2018-09-22 ENCOUNTER — Telehealth: Payer: Self-pay | Admitting: Internal Medicine

## 2018-09-22 NOTE — Telephone Encounter (Signed)
Medication Refill - Medication: One Touch Vero Test strips (Patient stated that the wrong test strips were sent to pharmacy.  Has the patient contacted their pharmacy? Yes (Agent: If no, request that the patient contact the pharmacy for the refill.) (Agent: If yes, when and what did the pharmacy advise?)Contact Pcp  Preferred Pharmacy (with phone number or street name):  [id number] [place] [place]. [address] [phone] (Phone) [phone] (Fax)     Agent: Please be advised that RX refills may take up to 3 business days. We ask that you follow-up with your pharmacy.

## 2018-09-23 MED ORDER — ONETOUCH VERIO VI STRP: 1.0000 | ORAL_STRIP | Freq: Every day | 12 refills | Status: DC

## 2018-09-23 NOTE — Telephone Encounter (Signed)
New rx sent

## 2018-10-21 ENCOUNTER — Other Ambulatory Visit: Payer: Self-pay | Admitting: Internal Medicine

## 2018-10-21 ENCOUNTER — Other Ambulatory Visit: Payer: Self-pay | Admitting: *Deleted

## 2018-10-21 MED ORDER — ONETOUCH DELICA LANCETS 30G MISC: 1.0000 | Freq: Every day | 4 refills | Status: DC

## 2018-11-05 ENCOUNTER — Telehealth: Payer: Self-pay | Admitting: Internal Medicine

## 2018-11-05 MED ORDER — METFORMIN HCL 1000 MG PO TABS: 1000.0000 mg | ORAL_TABLET | Freq: Two times a day (BID) | ORAL | 1 refills | Status: DC

## 2018-11-05 NOTE — Telephone Encounter (Signed)
Left message on machine for patient.  New Rx sent.

## 2018-11-05 NOTE — Telephone Encounter (Signed)
Pt stated his metFORMIN (GLUCOPHAGE-XR) 500 MG 24 hr tablet Has been recalled. He would like to know if Dr. Jerilee Hoh wants him to continue taking or is she will send in an alternative. Requesting callback.  [id number] [place] [place] - [address] [phone] (Phone) [phone] (Fax)

## 2018-11-05 NOTE — Telephone Encounter (Signed)
Let's switch him out to regular metformin (not XR) same dose: 1000 mg BID

## 2018-11-11 ENCOUNTER — Other Ambulatory Visit: Payer: Self-pay | Admitting: Internal Medicine

## 2018-11-21 ENCOUNTER — Other Ambulatory Visit: Payer: Self-pay | Admitting: *Deleted

## 2018-11-21 MED ORDER — ATORVASTATIN CALCIUM 20 MG PO TABS: 20.0000 mg | ORAL_TABLET | Freq: Every day | ORAL | 1 refills | Status: DC

## 2018-12-16 ENCOUNTER — Other Ambulatory Visit: Payer: Self-pay | Admitting: Internal Medicine

## 2018-12-16 MED ORDER — BENAZEPRIL HCL 40 MG PO TABS: 40.0000 mg | ORAL_TABLET | Freq: Every day | ORAL | 0 refills | Status: DC

## 2018-12-16 MED ORDER — BISOPROLOL FUMARATE 5 MG PO TABS: 5.0000 mg | ORAL_TABLET | Freq: Every day | ORAL | 0 refills | Status: DC

## 2018-12-16 NOTE — Telephone Encounter (Signed)
Medication Refill - Medication:  benazepril (LOTENSIN) 40 MG tablet  bisoprolol (ZEBETA) 5 MG tablet  Has the patient contacted their pharmacy?  Yes advised to call office.  Preferred Pharmacy (with phone number or street name):  [id number] [place], [place]. [address] [phone] (Phone) [phone] (Fax)   Agent: Please be advised that RX refills may take up to 3 business days. We ask that you follow-up with your pharmacy.

## 2018-12-16 NOTE — Telephone Encounter (Signed)
Requested Prescriptions  Pending Prescriptions Disp Refills  . bisoprolol (ZEBETA) 5 MG tablet 30 tablet 0    Sig: Take 1 tablet (5 mg total) by mouth daily.     Cardiovascular:  Beta Blockers Passed - 12/16/2018  4:27 PM      Passed - Last BP in normal range    BP Readings from Last 1 Encounters:  07/23/18 111/72         Passed - Last Heart Rate in normal range    Pulse Readings from Last 1 Encounters:  07/23/18 74         Passed - Valid encounter within last 6 months    Recent Outpatient Visits          5 months ago Dyslipidemia   [place] [facility], [name], MD   9 months ago Dyslipidemia   [facility], [name], MD   1 year ago Essential hypertension   [facility] [place], Doretha Sou, MD   1 year ago Encounter for preventive health examination   [facility] at [place], Doretha Sou, MD   2 years ago Tick bite, initial [facility], [place] R, DO             . benazepril (LOTENSIN) 40 MG tablet 30 tablet 0    Sig: Take 1 tablet (40 mg total) by mouth daily.     Cardiovascular:  ACE Inhibitors Failed - 12/16/2018  4:27 PM      Failed - Cr in normal range and within 180 days    Creatinine, Ser  Date Value Ref Range Status  08/05/2015 0.91 0.40 - 1.50 mg/dL Final         Failed - K in normal range and within 180 days    Potassium  Date Value Ref Range Status  08/05/2015 3.9 3.5 - 5.1 mEq/L Final         Passed - Patient is not pregnant      Passed - Last BP in normal range    BP Readings from Last 1 Encounters:  07/23/18 111/72         Passed - Valid encounter within last 6 months    Recent Outpatient Visits          5 months ago Dyslipidemia   [place] [facility], [name], MD   9 months ago Dyslipidemia   [facility]rap[facility], [facility] at [facility], [name], MD   1 year  ago Essential hypertension   [place] [facility] [place], Doretha Sou, MD   1 year ago Encounter for preventive health examination   [place] [facility] [place], Doretha Sou, MD   2 years ago Tick bite, 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

## 2019-01-11 ENCOUNTER — Other Ambulatory Visit: Payer: Self-pay | Admitting: Internal Medicine

## 2019-01-21 ENCOUNTER — Other Ambulatory Visit: Payer: Self-pay | Admitting: Internal Medicine

## 2019-01-22 ENCOUNTER — Telehealth: Payer: Self-pay | Admitting: Internal Medicine

## 2019-03-12 ENCOUNTER — Other Ambulatory Visit: Payer: Self-pay | Admitting: *Deleted

## 2019-03-12 MED ORDER — TADALAFIL 20 MG PO TABS: 20.0000 mg | ORAL_TABLET | Freq: Every day | ORAL | 5 refills | Status: DC | PRN

## 2019-04-12 ENCOUNTER — Other Ambulatory Visit: Payer: Self-pay | Admitting: Internal Medicine

## 2019-04-13 ENCOUNTER — Other Ambulatory Visit: Payer: Self-pay | Admitting: Internal Medicine

## 2019-05-06 ENCOUNTER — Other Ambulatory Visit: Payer: Self-pay | Admitting: Internal Medicine

## 2019-05-09 ENCOUNTER — Other Ambulatory Visit: Payer: Self-pay | Admitting: Internal Medicine

## 2019-05-19 ENCOUNTER — Telehealth: Payer: Self-pay | Admitting: Cardiovascular Disease

## 2019-05-19 NOTE — Telephone Encounter (Signed)
Scheduled the patient for cardiac evaluation with Dr .Burt Knack 4/12. He will need surgical clearance for R total knee replacement to be arranged in late April or May. He was grateful for assistance.

## 2019-05-19 NOTE — Telephone Encounter (Signed)
New message:   Patient calling to get a medical clearance apt, however Dr. Burt Knack do not have any openings. Patient last seen on 07/2019. Please call patient.

## 2019-06-05 ENCOUNTER — Other Ambulatory Visit: Payer: Self-pay | Admitting: Internal Medicine

## 2019-06-08 ENCOUNTER — Telehealth: Payer: Self-pay | Admitting: *Deleted

## 2019-06-08 NOTE — Telephone Encounter (Signed)
   [facility] Pre-operative Risk Assessment    Request for surgical clearance:  1. What type of surgery is being performed? RIGHT TOTAL KNEE ARTHROPLASTY   2. When is this surgery scheduled? 07/06/19   3. What type of clearance is required (medical clearance vs. Pharmacy clearance to hold med vs. Both)? MEDICAL  4. Are there any medications that need to be held prior to surgery and how long? ASA   5. Practice name and name of physician performing surgery? [facility]; DR. Caberfae   6. What is your office phone number [phone]    7.   What is your office fax number [phone] ATTN: SHERRY WILLS  8.   Anesthesia type (None, local, MAC, general) ? SPINAL   Jose [name] 06/08/2019, 1:39 PM  _________________________________________________________________   (provider comments below)

## 2019-06-08 NOTE — Telephone Encounter (Signed)
Left a detailed voice message for [place] her that the patient has an upcoming appointment on 06/15/19 with Dr. Burt Knack and that preoperative assessment will be addressed at that appointment. I also stated that if she has any questions to give our office a call.

## 2019-06-08 NOTE — Telephone Encounter (Signed)
   Primary Cardiologist: [name], MD  Chart reviewed as part of pre-operative protocol coverage. Patient is scheduled to see Dr. Burt Knack 06/15/19 for preoperative assessment.   I will route this to Dr. Burt Knack as an FYI and remove from pre-op pool.   Callback:   Please notify the requesting surgeons office of the upcoming appointment.   Abigail Butts, PA-C 06/08/2019, 1:52 PM

## 2019-06-15 ENCOUNTER — Ambulatory Visit: Payer: 59 | Admitting: Cardiovascular Disease

## 2019-06-15 ENCOUNTER — Other Ambulatory Visit: Payer: Self-pay

## 2019-06-15 ENCOUNTER — Telehealth: Payer: Self-pay | Admitting: Internal Medicine

## 2019-06-15 ENCOUNTER — Encounter: Payer: Self-pay | Admitting: Cardiovascular Disease

## 2019-06-15 VITALS — BP 166/94 | HR 62 | Ht 67.0 in | Wt 188.0 lb

## 2019-06-15 DIAGNOSIS — I10 Essential (primary) hypertension: Secondary | ICD-10-CM | POA: Diagnosis not present

## 2019-06-15 DIAGNOSIS — I2510 Atherosclerotic heart disease of native coronary artery without angina pectoris: Secondary | ICD-10-CM

## 2019-06-15 DIAGNOSIS — I452 Bifascicular block: Secondary | ICD-10-CM

## 2019-06-15 DIAGNOSIS — E782 Mixed hyperlipidemia: Secondary | ICD-10-CM

## 2019-06-15 NOTE — Patient Instructions (Addendum)
Medication Instructions:  Your provider recommends that you continue on your current medications as directed. Please refer to the Current Medication list given to you today.   *If you need a refill on your cardiac medications before your next appointment, please call your pharmacy*  Follow-Up: You are scheduled for a HTN CLINIC visit on 10/20/2019 at 1:30PM. Please bring a list of blood pressure readings to this visit.  At [facility], you and your health needs are our priority.  As part of our continuing mission to provide you with exceptional heart care, we have created designated Provider Care Teams.  These Care Teams include your primary Cardiologist (physician) and Advanced Practice Providers (APPs -  Physician Assistants and Nurse Practitioners) who all work together to provide you with the care you need, when you need it. Your next appointment:   12 month(s) The format for your next appointment:   In Person Provider:   You may see [name], MD or one of the following Advanced Practice Providers on your designated Care Team:    Richardson Dopp, PA-C  [place], [place]  Daune Perch, NP   Other information: You have been cleared for surgery!

## 2019-06-15 NOTE — Progress Notes (Signed)
Cardiology Office Note:    Date:  06/15/2019   ID:  Jose [name], DOB [birth year], MRN [id number]  PCP:  [name], [name], MD  Cardiologist:  [name], MD  Electrophysiologist:  None   Referring MD: [name], [name]   Chief Complaint  Patient presents with  . Coronary Artery Disease    History of Present Illness:    Jose [name] is a 69 y.o. male with a hx of coronary artery disease with remote stenting of the left circumflex using a drug-eluting stent.  His last heart catheterization in 2015 demonstrated single-vessel coronary artery disease with continued patency of the stented segment in the mid left circumflex and mild to moderate stenosis of the first obtuse marginal branch unchanged from the previous study in 2010.  The patient has done well with no recurrent ischemia over many years.  The patient is here alone today. He has his annual exam with Dr [name] later this month with fasting labs planned for that visit. He reports blood sugars in the range of 115-120 range. Today, he denies symptoms of palpitations, chest pain, shortness of breath, orthopnea, PND, lower extremity edema, dizziness, or syncope.  He continues to have problems with right knee pain and significant limitation related to this.  He is planning on having his knee replaced next month.  He notes that his home blood pressures have been elevated of late and he attributes this to the pain in his knee.  He has been taking ibuprofen on a regular basis as well.   Past Medical History:  Diagnosis Date  . ALCOHOL ABUSE 07/27/2008  . Allergy   . ANXIETY 07/27/2008  . Arthritis   . Bifascicular block    beta blocker stopped due to profound bradycardia  . CAD 07/27/2008   a. s/p Endeavor DES to CFX 07/2008; residual OM1 70%, EF 45%;  b. relook cath 5/10: patent stent in CFX;  c.  LHC (8/15):  prox LAD 30%, OM1 50% at bifurcation, OM1 sub-branch 50%, mid AVCFX stent patent, EF 55-65% - no change  from 2010 >>> Med Rx (after abnormal nuc)   . Cataract   . Diabetes mellitus without complication ([place])   . GERD 09/18/2006  . History of TIA (transient ischemic attack) may 2010  . Hx of adenomatous colonic polyps   . Hx of cardiovascular stress test    ETT-Myoview (09/2013):  Partially fixed defect with some reversibility - cannot r/o inf ischemia vs diaph atten, EF 51%; mod risk  . Hyperlipidemia   . HYPERTENSION 09/18/2006  . LOW BACK PAIN 09/18/2006  . Myocardial infarction ([place]) 2010  . Numbness and tingling    finger tips at times  . Rosacea 07/27/2008  . SEIZURE DISORDER 09/18/2006   none since 2008  . 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  . Urinary tract infection start cipro 01-07-2012    Past Surgical History:  Procedure Laterality Date  . CORONARY STENT PLACEMENT     drug eluting  . HERNIA REPAIR    . INGUINAL HERNIA REPAIR  01/14/2012   Procedure: LAPAROSCOPIC INGUINAL HERNIA;  Surgeon: Ralene Ok, MD;  Location: [facility];  Service: General;  Laterality: Right;  . INSERTION OF MESH  01/14/2012   Procedure: INSERTION OF MESH;  Surgeon: Ralene Ok, MD;  Location: [facility];  Service: General;  Laterality: Right;  . KNEE SURGERY    . LEFT HEART CATHETERIZATION WITH CORONARY ANGIOGRAM N/A 10/21/2013   Procedure: LEFT HEART CATHETERIZATION WITH CORONARY ANGIOGRAM;  Surgeon:  Blane Ohara, MD;  Location[facility];  Service: Cardiovascular;  Laterality: N/A;  . MEDIAL PARTIAL KNEE REPLACEMENT Left     Current Medications: Current Meds  Medication Sig  . ALPRAZolam (XANAX) 0.5 MG tablet TAKE 1 TABLET BY MOUTH THREE TIMES A DAY AS NEEDED  . aspirin 81 MG tablet Take 81 mg by mouth every morning.   Marland Kitchen atorvastatin (LIPITOR) 20 MG tablet TAKE 1 TABLET BY MOUTH EVERY DAY  . Azelaic Acid 15 % cream APPLY 1 APPLICATION TOPICALLY AS DIRECTED. AFTER SKIN IS THOROUGHLY WASHED AND PATTED DRY, GENTLY BUT THOROUGHLY MASSAGE A THIN FILM OF AZELAIC ACID INTO THE AFFECTED AREA TWICE DAILY, IN  THE MORNING AND EVENING. APPLIED TO FACE.  . benazepril (LOTENSIN) 40 MG tablet TAKE 1 TABLET BY MOUTH EVERY DAY  . bisoprolol (ZEBETA) 5 MG tablet TAKE 1 TABLET BY MOUTH EVERY DAY  . diclofenac sodium (VOLTAREN) 1 % GEL Apply 2 g topically 2 (two) times daily as needed. Apply to painful areas around knees  . glucose blood (ONETOUCH VERIO) test strip 1 each by Other route daily. DX E11.51 - ONE TOUCH VERIO  . metFORMIN (GLUCOPHAGE-XR) 500 MG 24 hr tablet Take 2 tablets by mouth in the morning and at bedtime.  . Multiple Vitamin (MULTIVITAMIN WITH MINERALS) TABS Take 1 tablet by mouth daily.  . nitroGLYCERIN (NITROSTAT) 0.4 MG SL tablet DISSOLVE 1 TABLET BY MOUTH UNDER THE TONGUE EVERY 5 MINUTES AS NEEDED FOR CHEST PAIN  . OneTouch Delica Lancets [id number] MISC 1 each by Does not apply route daily.  Marland Kitchen OVER THE COUNTER MEDICATION Vitamin D 3. 1000 units one capsule daily.  . tadalafil (CIALIS) 20 MG tablet Take 1 tablet (20 mg total) by mouth daily as needed for erectile dysfunction.  . vitamin C (ASCORBIC ACID) 500 MG tablet Take 500 mg by mouth daily. Reported on 08/23/2015   Current Facility-Administered Medications for the 06/15/19 encounter (Office Visit) with [name], MD  Medication  . 0.9 %  sodium chloride infusion     Allergies:   Patient has no known allergies.   Social History   Socioeconomic History  . Marital status: Married    Spouse name: Not on file  . Number of children: 2  . Years of education: Not on file  . Highest education level: Not on file  Occupational History  . Occupation: Civil engineer, contracting: [employer]  Tobacco Use  . Smoking status: Never Smoker  . Smokeless tobacco: Former Systems developer    Types: Chew  Substance and Sexual Activity  . Alcohol use: No    Comment: past drinker 2-3 daily and sometimes binge drinks on the weekend  . Drug use: No  . Sexual activity: Not on file  Other Topics Concern  . Not on file  Social  History Narrative  . Not on file   Social Determinants of Health   Financial Resource Strain:   . Difficulty of Paying Living Expenses:   Food Insecurity:   . Worried About Charity fundraiser in the Last Year:   . Arboriculturist in the Last Year:   Transportation Needs:   . Film/video editor (Medical):   Marland Kitchen Lack of Transportation (Non-Medical):   Physical Activity:   . Days of Exercise per Week:   . Minutes of Exercise per Session:   Stress:   . Feeling of Stress :   Social Connections:   . Frequency of Communication with  Friends and Family:   . Frequency of Social Gatherings with Friends and Family:   . Attends Religious Services:   . Active Member of Clubs or Organizations:   . Attends Archivist Meetings:   Marland Kitchen Marital Status:      Family History: The patient's family history includes Diabetes in his mother; Emphysema in his father; Heart attack in his unknown relative; Heart failure in his father; Hyperlipidemia in his mother; Hypertension in his mother; Lung cancer in his mother. There is no history of Colon cancer, Stroke, Esophageal cancer, Liver cancer, Pancreatic cancer, Stomach cancer, or Rectal cancer.  ROS:   Please see the history of present illness.    All other systems reviewed and are negative.  EKGs/Labs/Other Studies Reviewed:    The following studies were reviewed today: Cardiac Cath 10-21-2013: Final Conclusions:  1. Single-vessel coronary artery disease with continued patency of the stented segment in the mid left circumflex and mild to moderate stenosis of the first obtuse marginal branch 2. Mild nonobstructive LAD stenosis 3. Small, nondominant RCA 4. Normal LV systolic function  Recommendations:The patient's films were compared to his previous study from 2010. There is no change in coronary anatomy and there clearly is stability without any significant progression of obstructive CAD. Recommend ongoing risk reduction measures.  EKG:   EKG is ordered today.  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 No significant change from last tracing in 2018.  Recent Labs: No results found for requested labs within last 8760 hours.  Recent Lipid Panel    Component Value Date/Time   CHOL 164 08/05/2015 1134   TRIG 154.0 (H) 08/05/2015 1134   HDL 38.30 (L) 08/05/2015 1134   CHOLHDL 4 08/05/2015 1134   VLDL 30.8 08/05/2015 1134   LDLCALC 95 08/05/2015 1134   LDLDIRECT 163.3 07/09/2007 0818    Physical Exam:    VS:  BP (!) 166/94   Pulse 62   Ht 5\' 7"  (1.702 m)   Wt 188 lb (85.3 kg)   SpO2 98%   BMI 29.44 kg/m     Wt Readings from Last 3 Encounters:  06/15/19 188 lb (85.3 kg)  07/23/18 181 lb (82.1 kg)  02/20/18 194 lb 1.6 oz (88 kg)     GEN:  Well nourished, well developed in no acute distress HEENT: Normal NECK: No JVD; No carotid bruits LYMPHATICS: No lymphadenopathy CARDIAC: RRR, no murmurs, rubs, gallops RESPIRATORY:  Clear to auscultation without rales, wheezing or rhonchi  ABDOMEN: Soft, non-tender, non-distended MUSCULOSKELETAL:  No edema; No deformity  SKIN: Warm and dry NEUROLOGIC:  Alert and oriented x 3 PSYCHIATRIC:  Normal affect   ASSESSMENT:    1. Bifascicular block   2. Coronary artery disease involving native coronary artery of native heart without angina pectoris   3. Mixed hyperlipidemia   4. Essential hypertension    PLAN:    In order of problems listed above:  1. Stable without significant change on his EKG.  He is having no symptoms of lightheadedness or syncope.  No medication changes are made today. 2. The patient is stable with no symptoms of angina.  I think he is at low cardiac risk of proceeding with knee replacement without any need for preoperative testing. 3. The patient is treated with atorvastatin.  He has upcoming labs with his primary care physician.  Lifestyle modification discussed today. 4. Blood pressure is above goal.  He has a history of  whitecoat hypertension.  It  sounds like his home readings are ranging in the 130s to 140s on a regular basis.  I think this is likely related to regular use of a nonsteroidal anti-inflammatory and pain in his knee.  I did not make any changes today.  He will move forward with surgery and we will arrange a closer follow-up visit to see how his blood pressure is running postoperatively.  Will arrange a visit in the hypertension clinic in August.  He will bring readings with him at that time.  Could consider changing bisoprolol to carvedilol for more potent blood pressure lowering, or could add amlodipine.   Medication Adjustments/Labs and Tests Ordered: Current medicines are reviewed at length with the patient today.  Concerns regarding medicines are outlined above.  Orders Placed This Encounter  Procedures  . EKG 12-Lead   No orders of the defined types were placed in this encounter.   Patient Instructions  Medication Instructions:  Your provider recommends that you continue on your current medications as directed. Please refer to the Current Medication list given to you today.   *If you need a refill on your cardiac medications before your next appointment, please call your pharmacy*  Follow-Up: You are scheduled for a HTN CLINIC visit on 10/20/2019 at 1:30PM. Please bring a list of blood pressure readings to this visit.  At [name], you and your health needs are our priority.  As part of our continuing mission to provide you with exceptional heart care, we have created designated Provider Care Teams.  These Care Teams include your primary Cardiologist (physician) and Advanced Practice Providers (APPs -  Physician Assistants and Nurse Practitioners) who all work together to provide you with the care you need, when you need it. Your next appointment:   12 month(s) The format for your next appointment:   In Person Provider:   You may see [name], MD or one of the following  Advanced Practice Providers on your designated Care Team:    Richardson Dopp, PA-C  [place], [place]  Daune Perch, NP   Other information: You have been cleared for surgery!    Signed, [name], MD  06/15/2019 12:52 PM    [place]

## 2019-06-15 NOTE — Telephone Encounter (Signed)
Pt is having knee replacement surgery on 07/06/19 and will need labs done before his appt on 07/03/19 with Dr. Jerilee Hoh. Pt is asking, for a lab order so he can come in for labs only the week of 06/22/19? Pt is aware that you are out of the office today and will return on 06/16/19. Thanks

## 2019-06-16 NOTE — Telephone Encounter (Signed)
Attempted to call patient, but no answer. Patient will need a surgical clearance visit.  Dr Jerilee Hoh can do this at his next office visit.  What labs would he like?

## 2019-06-19 ENCOUNTER — Encounter: Payer: 59 | Admitting: Internal Medicine

## 2019-06-19 NOTE — Telephone Encounter (Signed)
Appointment scheduled.

## 2019-06-21 ENCOUNTER — Encounter (HOSPITAL_COMMUNITY): Payer: Self-pay | Admitting: Emergency Medicine

## 2019-06-21 ENCOUNTER — Emergency Department (HOSPITAL_COMMUNITY): Payer: 59

## 2019-06-21 ENCOUNTER — Other Ambulatory Visit: Payer: Self-pay

## 2019-06-21 ENCOUNTER — Emergency Department (HOSPITAL_COMMUNITY)
Admission: EM | Admit: 2019-06-21 | Discharge: 2019-06-21 | Disposition: A | Discharge status: Home / Self Care | Payer: 59 | Attending: Emergency Medicine | Admitting: Emergency Medicine

## 2019-06-21 DIAGNOSIS — Z955 Presence of coronary angioplasty implant and graft: Secondary | ICD-10-CM | POA: Insufficient documentation

## 2019-06-21 DIAGNOSIS — K5792 Diverticulitis of intestine, part unspecified, without perforation or abscess without bleeding: Secondary | ICD-10-CM

## 2019-06-21 DIAGNOSIS — N400 Enlarged prostate without lower urinary tract symptoms: Secondary | ICD-10-CM | POA: Insufficient documentation

## 2019-06-21 DIAGNOSIS — I2510 Atherosclerotic heart disease of native coronary artery without angina pectoris: Secondary | ICD-10-CM | POA: Diagnosis not present

## 2019-06-21 DIAGNOSIS — Z79899 Other long term (current) drug therapy: Secondary | ICD-10-CM | POA: Insufficient documentation

## 2019-06-21 DIAGNOSIS — E119 Type 2 diabetes mellitus without complications: Secondary | ICD-10-CM | POA: Insufficient documentation

## 2019-06-21 DIAGNOSIS — Z7984 Long term (current) use of oral hypoglycemic drugs: Secondary | ICD-10-CM | POA: Diagnosis not present

## 2019-06-21 DIAGNOSIS — R1032 Left lower quadrant pain: Secondary | ICD-10-CM | POA: Diagnosis present

## 2019-06-21 LAB — CBC WITH DIFFERENTIAL/PLATELET
Abs Immature Granulocytes: 0.03 10*3/uL (ref 0.00–0.07)
Basophils Absolute: 0 10*3/uL (ref 0.0–0.1)
Basophils Relative: 0 %
Eosinophils Absolute: 0 10*3/uL (ref 0.0–0.5)
Eosinophils Relative: 0 %
HCT: 44.1 % (ref 39.0–52.0)
Hemoglobin: 14.9 g/dL (ref 13.0–17.0)
Immature Granulocytes: 0 %
Lymphocytes Relative: 18 %
Lymphs Abs: 1.6 10*3/uL (ref 0.7–4.0)
MCH: 33.6 pg (ref 26.0–34.0)
MCHC: 33.8 g/dL (ref 30.0–36.0)
MCV: 99.5 fL (ref 80.0–100.0)
Monocytes Absolute: 0.9 10*3/uL (ref 0.1–1.0)
Monocytes Relative: 10 %
Neutro Abs: 6.2 10*3/uL (ref 1.7–7.7)
Neutrophils Relative %: 72 %
Platelets: 168 10*3/uL (ref 150–400)
RBC: 4.43 MIL/uL (ref 4.22–5.81)
RDW: 12.7 % (ref 11.5–15.5)
WBC: 8.7 10*3/uL (ref 4.0–10.5)
nRBC: 0 % (ref 0.0–0.2)

## 2019-06-21 LAB — URINALYSIS, ROUTINE W REFLEX MICROSCOPIC
Bilirubin Urine: NEGATIVE
Glucose, UA: NEGATIVE mg/dL
Hgb urine dipstick: NEGATIVE
Ketones, ur: 80 mg/dL — AB
Leukocytes,Ua: NEGATIVE
Nitrite: NEGATIVE
Protein, ur: NEGATIVE mg/dL
Specific Gravity, Urine: 1.024 (ref 1.005–1.030)
pH: 5 (ref 5.0–8.0)

## 2019-06-21 LAB — COMPREHENSIVE METABOLIC PANEL
ALT: 28 U/L (ref 0–44)
AST: 32 U/L (ref 15–41)
Albumin: 3.6 g/dL (ref 3.5–5.0)
Alkaline Phosphatase: 59 U/L (ref 38–126)
Anion gap: 15 (ref 5–15)
BUN: 16 mg/dL (ref 8–23)
CO2: 25 mmol/L (ref 22–32)
Calcium: 8.7 mg/dL — ABNORMAL LOW (ref 8.9–10.3)
Chloride: 102 mmol/L (ref 98–111)
Creatinine, Ser: 0.88 mg/dL (ref 0.61–1.24)
GFR calc Af Amer: >60 mL/min (ref >60)
GFR calc non Af Amer: >60 mL/min (ref >60)
Glucose, Bld: 78 mg/dL (ref 70–99)
Potassium: 4 mmol/L (ref 3.5–5.1)
Sodium: 142 mmol/L (ref 135–145)
Total Bilirubin: 1.2 mg/dL (ref 0.3–1.2)
Total Protein: 6.4 g/dL — ABNORMAL LOW (ref 6.5–8.1)

## 2019-06-21 LAB — LIPASE, BLOOD: Lipase: 25 U/L (ref 11–51)

## 2019-06-21 MED ORDER — HYDROCODONE-ACETAMINOPHEN 5-325 MG PO TABS: 1.0000 | ORAL_TABLET | Freq: Four times a day (QID) | ORAL | 0 refills | Status: DC | PRN

## 2019-06-21 MED ORDER — AMOXICILLIN-POT CLAVULANATE 875-125 MG PO TABS: 1.0000 | ORAL_TABLET | Freq: Two times a day (BID) | ORAL | 0 refills | Status: AC

## 2019-06-21 MED ORDER — SODIUM CHLORIDE (PF) 0.9 % IJ SOLN
INTRAMUSCULAR | Status: AC
  Filled 2019-06-21: qty 50

## 2019-06-21 MED ORDER — MORPHINE SULFATE (PF) 4 MG/ML IV SOLN
4.0000 mg | Freq: Once | INTRAVENOUS | Status: AC
  Administered 2019-06-21: 4 mg via INTRAVENOUS
  Filled 2019-06-21: qty 1

## 2019-06-21 MED ORDER — IOHEXOL 300 MG/ML  SOLN
100.0000 mL | Freq: Once | INTRAMUSCULAR | Status: AC | PRN
  Administered 2019-06-21: 100 mL via INTRAVENOUS

## 2019-06-21 MED ORDER — AMOXICILLIN-POT CLAVULANATE 875-125 MG PO TABS
1.0000 | ORAL_TABLET | Freq: Once | ORAL | Status: AC
  Administered 2019-06-21: 1 via ORAL
  Filled 2019-06-21: qty 1

## 2019-06-21 NOTE — ED Notes (Signed)
To CT

## 2019-06-21 NOTE — ED Triage Notes (Signed)
Patient here from home reporting left side abd pain that started on Friday. Denies n/v.

## 2019-06-21 NOTE — Discharge Instructions (Signed)
Take antibiotics as prescribed.  Take the entire course, even if your symptoms improve. Take ibuprofen 3 times a day with meals.  Do not take other anti-inflammatories at the same time (Advil, Motrin, naproxen, Aleve). You may supplement with Tylenol if you need further pain control. Use Norco as needed for severe breakthrough pain.  Have caution, this may make you tired or groggy.  Do not drive or operate a function while taking this medicine. Follow-up with your primary care doctor to schedule appointment for reevaluation of your symptoms. Your CT today showed an enlarged prostate.  This should be discussed with your primary care doctor for further evaluation management. Return to the emergency room if you develop fevers, severe worsening pain, persistent vomiting, or any new, worsening, concerning symptoms.

## 2019-06-21 NOTE — ED Provider Notes (Signed)
[facility] DEPT Provider Note   CSN: [id number] Arrival date & time: 06/21/19  1032     History Chief Complaint  Patient presents with  . Abdominal Pain    Jose [name] is a 69 y.o. male presenting for evaluation of abdominal pain.  Patient states his symptoms began 3 days ago.  He reports acute onset left lower quadrant abdominal pain.  Pain has been intermittent, but gradually worsening.  Pain is severe, worse with movement.  Nothing makes it better.  No change in pain with p.o. intake, urination, or bowel movements.  He denies history of similar pain.  He reports chills, no fevers.  Denies nausea or vomiting.  He denies dysuria, hematuria, urinary frequency.  No hematochezia or melena.  Patient states his last bowel movement was around 930 this morning, it was looser than normal.  He denies a previous history of kidney stones or diverticulitis. He reports a previous history of hernia repair, otherwise no abdominal surgeries.  Additional history obtained from chart review.  Patient with a history of CAD, diabetes, GERD, hypertension, hyperlipidemia, alcohol use, elevated PSA.  Per colonoscopy from 2019, patient has diverticula.  HPI     Past Medical History:  Diagnosis Date  . ALCOHOL ABUSE 07/27/2008  . Allergy   . ANXIETY 07/27/2008  . Arthritis   . Bifascicular block    beta blocker stopped due to profound bradycardia  . CAD 07/27/2008   a. s/p Endeavor DES to CFX 07/2008; residual OM1 70%, EF 45%;  b. relook cath 5/10: patent stent in CFX;  c.  LHC (8/15):  prox LAD 30%, OM1 50% at bifurcation, OM1 sub-branch 50%, mid AVCFX stent patent, EF 55-65% - no change from 2010 >>> Med Rx (after abnormal nuc)   . Cataract   . Diabetes mellitus without complication ([place])   . GERD 09/18/2006  . History of TIA (transient ischemic attack) may 2010  . Hx of adenomatous colonic polyps   . Hx of cardiovascular stress test    ETT-Myoview (09/2013):  Partially  fixed defect with some reversibility - cannot r/o inf ischemia vs diaph atten, EF 51%; mod risk  . Hyperlipidemia   . HYPERTENSION 09/18/2006  . LOW BACK PAIN 09/18/2006  . Myocardial infarction ([place]) 2010  . Numbness and tingling    finger tips at times  . Rosacea 07/27/2008  . SEIZURE DISORDER 09/18/2006   none since 2008  . 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  . Urinary tract infection start cipro 01-07-2012    Patient Active Problem List   Diagnosis Date Noted  . Diabetes mellitus with peripheral vascular disease ([place]) 11/29/2015  . TSH elevation 12/21/2013  . Elevated PSA 03/11/2012  . Right inguinal hernia 11/30/2011  . Bradycardia 06/19/2010  . RT BUNDLE BRANCH BLOCK&LT ANT FASCICULAR BLOCK 12/15/2008  . Dyslipidemia 08/05/2008  . ANXIETY 07/27/2008  . ALCOHOL ABUSE 07/27/2008  . Coronary atherosclerosis 07/27/2008  . ROSACEA 07/27/2008  . FACIAL PARESTHESIA, RIGHT 07/27/2008  . Personal history of colonic polyps 01/08/2007  . Essential hypertension 09/18/2006  . GERD 09/18/2006  . LOW BACK PAIN 09/18/2006    Past Surgical History:  Procedure Laterality Date  . CORONARY STENT PLACEMENT     drug eluting  . HERNIA REPAIR    . INGUINAL HERNIA REPAIR  01/14/2012   Procedure: LAPAROSCOPIC INGUINAL HERNIA;  Surgeon: Ralene Ok, MD;  Location: [facility] ORS;  Service: General;  Laterality: Right;  . INSERTION OF MESH  01/14/2012   Procedure: INSERTION OF MESH;  Surgeon: Ralene Ok, MD;  Location: [facility] OR[facility];  Service: General;  Laterality: Right;  . KNEE SURGERY    . LEFT HEART CATHETERIZATION WITH CORONARY ANGIOGRAM N/A 10/21/2013   Procedure: LEFT HEART CATHETERIZATION WITH CORONARY ANGIOGRAM;  Surgeon: Blane Ohara, MD;  Location: [facility] CATH LAB;  Service: Cardiovascular;  Laterality: N/A;  . MEDIAL PARTIAL KNEE REPLACEMENT Left        Family History  Problem Relation Age of Onset  . Lung cancer Mother   . Hypertension Mother   . Diabetes Mother   . Hyperlipidemia Mother     . Emphysema Father   . Heart failure Father   . Heart attack Other   . Colon cancer Neg Hx   . Stroke Neg Hx   . Esophageal cancer Neg Hx   . Liver cancer Neg Hx   . Pancreatic cancer Neg Hx   . Stomach cancer Neg Hx   . Rectal cancer Neg Hx     Social History   Tobacco Use  . Smoking status: Never Smoker  . Smokeless tobacco: Former Systems developer    Types: Chew  Substance Use Topics  . Alcohol use: No    Comment: past drinker 2-3 daily and sometimes binge drinks on the weekend  . Drug use: No    Home Medications Prior to Admission medications   Medication Sig Start Date End Date Taking? Authorizing Provider  atorvastatin (LIPITOR) 20 MG tablet TAKE 1 TABLET BY MOUTH EVERY DAY Patient taking differently: Take 20 mg by mouth daily.  06/08/19  Yes [name], [name], MD  Azelaic Acid 15 % cream APPLY 1 APPLICATION TOPICALLY AS DIRECTED. AFTER SKIN IS THOROUGHLY WASHED AND PATTED DRY, GENTLY BUT THOROUGHLY MASSAGE A THIN FILM OF AZELAIC ACID INTO THE AFFECTED AREA TWICE DAILY, IN THE MORNING AND EVENING. APPLIED TO FACE. Patient taking differently: Apply 1 application topically 2 (two) times daily as needed (rosacea).  10/22/18  Yes [name], [name], MD  BAYER LOW DOSE 81 MG chewable tablet Chew 81 mg by mouth daily. 06/15/19  Yes [provider]  benazepril (LOTENSIN) 40 MG tablet TAKE 1 TABLET BY MOUTH EVERY DAY Patient taking differently: Take 40 mg by mouth daily.  01/21/19  Yes [name], [name], MD  bisoprolol (ZEBETA) 5 MG tablet TAKE 1 TABLET BY MOUTH EVERY DAY Patient taking differently: Take 5 mg by mouth daily.  04/14/19  Yes [name], [name], MD  cholecalciferol (VITAMIN D) 25 MCG (1000 UNIT) tablet Take 1,000 Units by mouth daily.   Yes [provider]  diclofenac sodium (VOLTAREN) 1 % GEL Apply 2 g topically 2 (two) times daily as needed (joint pain (knees)).    Yes [provider]  glucose blood (ONETOUCH VERIO) test  strip 1 each by Other route daily. DX E11.51 - ONE TOUCH VERIO 09/23/18  Yes [name], [name], MD  metFORMIN (GLUCOPHAGE) 1000 MG tablet Take 1,000 mg by mouth 2 (two) times daily with a meal.   Yes [provider]  Multiple Vitamin (MULTIVITAMIN WITH MINERALS) TABS Take 1 tablet by mouth daily.   Yes [provider]  nitroGLYCERIN (NITROSTAT) 0.4 MG SL tablet DISSOLVE 1 TABLET BY MOUTH UNDER THE TONGUE EVERY 5 MINUTES AS NEEDED FOR CHEST PAIN Patient taking differently: Place 0.4 mg under the tongue every 5 (five) minutes as needed for chest pain.  02/08/17  Yes Bensimhon, Shaune Pascal, MD  OneTouch Delica Lancets [id number] MISC 1 each by Does not  apply route daily. 10/21/18  Yes [name], [name], MD  tadalafil (CIALIS) 20 MG tablet Take 1 tablet (20 mg total) by mouth daily as needed for erectile dysfunction. 03/12/19 06/21/19 Yes Erline Hau, MD  vitamin C (ASCORBIC ACID) 500 MG tablet Take 500 mg by mouth daily. Reported on 08/23/2015   Yes [provider]  ALPRAZolam Duanne Moron) 0.5 MG tablet TAKE 1 TABLET BY MOUTH THREE TIMES A DAY AS NEEDED Patient not taking: Reported on 06/21/2019 04/14/19   [name], [name], MD  amoxicillin-clavulanate (AUGMENTIN) 875-125 MG tablet Take 1 tablet by mouth every 12 (twelve) hours for 10 days. 06/21/19 07/01/19  Danuta Huseman, PA-C  HYDROcodone-acetaminophen (NORCO/VICODIN) 5-325 MG tablet Take 1 tablet by mouth every 6 (six) hours as needed. 06/21/19   K[place]e Ciavarella, PA-C    Allergies    Patient has no known allergies.  Review of Systems   Review of Systems  Gastrointestinal: Positive for abdominal pain. Diarrhea: Loose bowel movement this morning.  All other systems reviewed and are negative.   Physical Exam Updated Vital Signs BP (!) 199/90 (BP Location: Right Arm)   Pulse 73   Temp 98.2 F (36.8 C) (Oral)   Resp 18   Ht 5\' 7"  (1.702 m)   Wt 81.6 kg   SpO2 95%   BMI 28.19 kg/m    Physical Exam Vitals and nursing note reviewed.  Constitutional:      General: He is not in acute distress.    Appearance: He is well-developed.     Comments: Appears nontoxic  HENT:     Head: Normocephalic and atraumatic.  Eyes:     Conjunctiva/sclera: Conjunctivae normal.     Pupils: Pupils are equal, round, and reactive to light.  Cardiovascular:     Rate and Rhythm: Normal rate and regular rhythm.     Pulses: Normal pulses.  Pulmonary:     Effort: Pulmonary effort is normal. No respiratory distress.     Breath sounds: Normal breath sounds. No wheezing.  Abdominal:     General: Bowel sounds are normal. There is no distension.     Palpations: Abdomen is soft. There is no mass.     Tenderness: There is abdominal tenderness in the left lower quadrant. There is guarding. There is no rebound.     Comments: Tenderness palpation of left lower quadrant abdomen with mild guarding.  No rigidity or distention.  Negative rebound.  No peritonitis.  No CVA tenderness.  Bowel sounds normal.  Musculoskeletal:        General: Normal range of motion.     Cervical back: Normal range of motion and neck supple.  Skin:    General: Skin is warm and dry.     Capillary Refill: Capillary refill takes less than 2 seconds.  Neurological:     Mental Status: He is alert and oriented to person, place, and time.     ED Results / Procedures / Treatments   Labs (all labs ordered are listed, but only abnormal results are displayed) Labs Reviewed  COMPREHENSIVE METABOLIC PANEL - Abnormal; Notable for the following components:      Result Value   Calcium 8.7 (*)    Total Protein 6.4 (*)    All other components within normal limits  URINALYSIS, ROUTINE W REFLEX MICROSCOPIC - Abnormal; Notable for the following components:   Ketones, ur 80 (*)    All other components within normal limits  URINE CULTURE  CBC WITH DIFFERENTIAL/PLATELET  LIPASE, BLOOD    EKG None  Radiology CT ABDOMEN PELVIS W  CONTRAST  Result Date: 06/21/2019 CLINICAL DATA:  LEFT-sided abdominal pain starting Friday. EXAM: CT ABDOMEN AND PELVIS WITH CONTRAST TECHNIQUE: Multidetector CT imaging of the abdomen and pelvis was performed using the standard protocol following bolus administration of intravenous contrast. CONTRAST:  155mL OMNIPAQUE IOHEXOL 300 MG/ML  SOLN COMPARISON:  None. FINDINGS: Lower chest: Focal consolidation within the lingula, likely atelectasis. Hepatobiliary: Liver is diffusely low in density suggesting fatty infiltration. Gallbladder appears normal. No bile duct dilatation seen. Pancreas: Unremarkable. No pancreatic ductal dilatation or surrounding inflammatory changes. Spleen: Normal in size without focal abnormality. Adrenals/Urinary Tract: Adrenal glands appear normal. Kidneys are unremarkable without mass, stone or hydronephrosis. No ureteral or bladder calculi identified. Bladder is unremarkable, partially decompressed. Stomach/Bowel: Focal thickening of the walls of the LEFT colon, lower descending colon, consistent with acute diverticulitis. No pericolonic abscess collection or free intraperitoneal air. No dilated large or small bowel loops. Stomach is unremarkable, partially decompressed. Vascular/Lymphatic: Aortic atherosclerosis. No acute appearing vascular abnormality. No enlarged lymph nodes seen in the abdomen or pelvis. Reproductive: Prostate gland is slightly prominent in size with nodular thickening anterior-superiorly causing mass effect on the bladder base. Other: No free fluid or abscess collection. No free intraperitoneal air. Musculoskeletal: Chronic bilateral pars interarticularis defects at L5-S1 with resultant grade 1 anterolisthesis of L5 on S1. No acute or suspicious osseous finding. Superficial soft tissues are unremarkable. IMPRESSION: 1. Acute uncomplicated diverticulitis of the lower descending colon. No pericolonic abscess collection or free intraperitoneal air. 2. Fatty infiltration  of the liver. 3. Prostate gland is slightly prominent in size with nodular thickening anterior-superiorly causing mass effect on the bladder base. Recommend correlation with physical exam findings and/or PSA lab values. 4. 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 Aortic Atherosclerosis (ICD10-I70.0). Electronically Signed   By: Franki Cabot M.D.   On: 06/21/2019 13:13    Procedures Procedures (including critical care time)  Medications Ordered in ED Medications  sodium chloride (PF) 0.9 % injection (has no administration in time range)  amoxicillin-clavulanate (AUGMENTIN) 875-125 MG per tablet 1 tablet (has no administration in time range)  morphine 4 MG/ML injection 4 mg (4 mg Intravenous Given 06/21/19 1128)  iohexol (OMNIPAQUE) 300 MG/ML solution 100 mL (100 mLs Intravenous Contrast Given 06/21/19 1236)    ED Course  I have reviewed the triage vital signs and the nursing notes.  Pertinent labs & imaging results that were available during my care of the patient were reviewed by me and considered in my medical decision making (see chart for details).    MDM Rules/Calculators/A&P                      Patient presenting for evaluation of left lower quadrant pain.  On exam, patient is very tender in his left lower quadrant.  Otherwise no abdominal tenderness.  Concern for possible diverticulitis, despite lack of fever and vomiting.  Also consider kidney stone.  Consider viral GI illness.  Less likely obstruction, as patient had a bowel movement this morning.  Will obtain labs and CT abdomen pelvis for further evaluation.  Labs interpreted by me, overall reassuring. Leukocytosis.  Electrolytes stable.  Kidney, liver, pancreatic function normal.  CT abdomen pelvis consistent with acute diverticulitis without perforation or abscess.  Will treat with antibiotics and pain medication.  Of note, patient was also found to have  enlarged  prostate.  Urine is without infection.  Patient states he has had prostatitis before, but this does not feel similar.  Patient has an appointment with his primary care doctor in 2 days, will have him follow-up with his PCP regarding enlarged prostate, as he may need PSA trending and/or urology evaluation.  Of note, patient is hypertensive in the ED.  Patient states he has a history of whitecoat syndrome, and tends to be hypertensive when in the emergency room.  He did take his blood pressure medicine today.  I encourage patient to follow-up with his primary care doctor regarding his high blood pressure for recheck in 2 days at his visit.  At this time, patient appears safe for discharge. Return precautions given.  Patient states he understands and agrees to plan.  Final Clinical Impression(s) / ED Diagnoses Final diagnoses:  Diverticulitis  Enlarged prostate    Rx / DC Orders ED Discharge Orders         Ordered    amoxicillin-clavulanate (AUGMENTIN) 875-125 MG tablet  Every 12 hours     06/21/19 1345    HYDROcodone-acetaminophen (NORCO/VICODIN) 5-325 MG tablet  Every 6 hours PRN     06/21/19 1345           Danicia Terhaar, PA-C 06/21/19 1431    Virgel Manifold, MD 06/24/19 0800

## 2019-06-22 LAB — URINE CULTURE: Culture: NO GROWTH

## 2019-06-23 ENCOUNTER — Other Ambulatory Visit: Payer: Self-pay

## 2019-06-23 ENCOUNTER — Ambulatory Visit (INDEPENDENT_AMBULATORY_CARE_PROVIDER_SITE_OTHER): Payer: 59 | Admitting: Internal Medicine

## 2019-06-23 ENCOUNTER — Encounter: Payer: Self-pay | Admitting: Internal Medicine

## 2019-06-23 VITALS — BP 160/80 | HR 81 | Temp 97.4°F | Ht 67.0 in | Wt 184.1 lb

## 2019-06-23 DIAGNOSIS — I2510 Atherosclerotic heart disease of native coronary artery without angina pectoris: Secondary | ICD-10-CM

## 2019-06-23 DIAGNOSIS — Z01818 Encounter for other preprocedural examination: Secondary | ICD-10-CM

## 2019-06-23 DIAGNOSIS — Z125 Encounter for screening for malignant neoplasm of prostate: Secondary | ICD-10-CM | POA: Diagnosis not present

## 2019-06-23 DIAGNOSIS — Z86010 Personal history of colonic polyps: Secondary | ICD-10-CM

## 2019-06-23 DIAGNOSIS — I10 Essential (primary) hypertension: Secondary | ICD-10-CM | POA: Diagnosis not present

## 2019-06-23 DIAGNOSIS — K219 Gastro-esophageal reflux disease without esophagitis: Secondary | ICD-10-CM

## 2019-06-23 DIAGNOSIS — Z09 Encounter for follow-up examination after completed treatment for conditions other than malignant neoplasm: Secondary | ICD-10-CM

## 2019-06-23 DIAGNOSIS — E785 Hyperlipidemia, unspecified: Secondary | ICD-10-CM

## 2019-06-23 DIAGNOSIS — E1151 Type 2 diabetes mellitus with diabetic peripheral angiopathy without gangrene: Secondary | ICD-10-CM

## 2019-06-23 DIAGNOSIS — Z0000 Encounter for general adult medical examination without abnormal findings: Secondary | ICD-10-CM

## 2019-06-23 LAB — CBC WITH DIFFERENTIAL/PLATELET
Basophils Absolute: 0 10*3/uL (ref 0.0–0.1)
Basophils Relative: 0.7 % (ref 0.0–3.0)
Eosinophils Absolute: 0.1 10*3/uL (ref 0.0–0.7)
Eosinophils Relative: 2.2 % (ref 0.0–5.0)
HCT: 44.2 % (ref 39.0–52.0)
Hemoglobin: 15 g/dL (ref 13.0–17.0)
Lymphocytes Relative: 24.9 % (ref 12.0–46.0)
Lymphs Abs: 1.4 10*3/uL (ref 0.7–4.0)
MCHC: 33.9 g/dL (ref 30.0–36.0)
MCV: 98.5 fl (ref 78.0–100.0)
Monocytes Absolute: 0.7 10*3/uL (ref 0.1–1.0)
Monocytes Relative: 13.5 % — ABNORMAL HIGH (ref 3.0–12.0)
Neutro Abs: 3.3 10*3/uL (ref 1.4–7.7)
Neutrophils Relative %: 58.7 % (ref 43.0–77.0)
Platelets: 164 10*3/uL (ref 150.0–400.0)
RBC: 4.49 Mil/uL (ref 4.22–5.81)
RDW: 13.4 % (ref 11.5–15.5)
WBC: 5.5 10*3/uL (ref 4.0–10.5)

## 2019-06-23 LAB — VITAMIN B12: Vitamin B-12: 302 pg/mL (ref 211–911)

## 2019-06-23 LAB — LIPID PANEL
Cholesterol: 137 mg/dL (ref 0–200)
HDL: 54 mg/dL (ref >39.00)
LDL Cholesterol: 57 mg/dL (ref 0–99)
NonHDL: 83.08
Total CHOL/HDL Ratio: 3
Triglycerides: 131 mg/dL (ref 0.0–149.0)
VLDL: 26.2 mg/dL (ref 0.0–40.0)

## 2019-06-23 LAB — COMPREHENSIVE METABOLIC PANEL
ALT: 23 U/L (ref 0–53)
AST: 29 U/L (ref 0–37)
Albumin: 4.1 g/dL (ref 3.5–5.2)
Alkaline Phosphatase: 65 U/L (ref 39–117)
BUN: 9 mg/dL (ref 6–23)
CO2: 32 mEq/L (ref 19–32)
Calcium: 9.5 mg/dL (ref 8.4–10.5)
Chloride: 98 mEq/L (ref 96–112)
Creatinine, Ser: 0.72 mg/dL (ref 0.40–1.50)
GFR: 108.97 mL/min (ref >60.00)
Glucose, Bld: 98 mg/dL (ref 70–99)
Potassium: 3.6 mEq/L (ref 3.5–5.1)
Sodium: 140 mEq/L (ref 135–145)
Total Bilirubin: 0.8 mg/dL (ref 0.2–1.2)
Total Protein: 6.4 g/dL (ref 6.0–8.3)

## 2019-06-23 LAB — TSH: TSH: 3.46 u[IU]/mL (ref 0.35–4.50)

## 2019-06-23 LAB — PSA: PSA: 7.49 ng/mL — ABNORMAL HIGH (ref 0.10–4.00)

## 2019-06-23 LAB — HEMOGLOBIN A1C: Hgb A1c MFr Bld: 5.5 % (ref 4.6–6.5)

## 2019-06-23 LAB — VITAMIN D 25 HYDROXY (VIT D DEFICIENCY, FRACTURES): VITD: 47.99 ng/mL (ref 30.00–100.00)

## 2019-06-23 NOTE — Patient Instructions (Signed)
-Nice seeing you today!!  -Lab work today; will notify you once results are available.  -Schedule follow up in 6 months; sooner if you will not be seeing Dr. Burt Knack for blood pressure management.   Preventive Care 7 Years and Older, Male Preventive care refers to lifestyle choices and visits with your health care provider that can promote health and wellness. This includes:  A yearly physical exam. This is also called an annual well check.  Regular dental and eye exams.  Immunizations.  Screening for certain conditions.  Healthy lifestyle choices, such as diet and exercise. What can I expect for my preventive care visit? Physical exam Your health care provider will check:  Height and weight. These may be used to calculate body mass index (BMI), which is a measurement that tells if you are at a healthy weight.  Heart rate and blood pressure.  Your skin for abnormal spots. Counseling Your health care provider may ask you questions about:  Alcohol, tobacco, and drug use.  Emotional well-being.  Home and relationship well-being.  Sexual activity.  Eating habits.  History of falls.  Memory and ability to understand (cognition).  Work and work Statistician. What immunizations do I need?  Influenza (flu) vaccine  This is recommended every year. Tetanus, diphtheria, and pertussis (Tdap) vaccine  You may need a Td booster every 10 years. Varicella (chickenpox) vaccine  You may need this vaccine if you have not already been vaccinated. Zoster (shingles) vaccine  You may need this after age 74. Pneumococcal conjugate (PCV13) vaccine  One dose is recommended after age 41. Pneumococcal polysaccharide (PPSV23) vaccine  One dose is recommended after age 47. Measles, mumps, and rubella (MMR) vaccine  You may need at least one dose of MMR if you were born in 1957 or later. You may also need a second dose. Meningococcal conjugate (MenACWY) vaccine  You may need  this if you have certain conditions. Hepatitis A vaccine  You may need this if you have certain conditions or if you travel or work in places where you may be exposed to hepatitis A. Hepatitis B vaccine  You may need this if you have certain conditions or if you travel or work in places where you may be exposed to hepatitis B. Haemophilus influenzae type b (Hib) vaccine  You may need this if you have certain conditions. You may receive vaccines as individual doses or as more than one vaccine together in one shot (combination vaccines). Talk with your health care provider about the risks and benefits of combination vaccines. What tests do I need? Blood tests  Lipid and cholesterol levels. These may be checked every 5 years, or more frequently depending on your overall health.  Hepatitis C test.  Hepatitis B test. Screening  Lung cancer screening. You may have this screening every year starting at age 15 if you have a 30-pack-year history of smoking and currently smoke or have quit within the past 15 years.  Colorectal cancer screening. All adults should have this screening starting at age 59 and continuing until age 68. Your health care provider may recommend screening at age 16 if you are at increased risk. You will have tests every 1-10 years, depending on your results and the type of screening test.  Prostate cancer screening. Recommendations will vary depending on your family history and other risks.  Diabetes screening. This is done by checking your blood sugar (glucose) after you have not eaten for a while (fasting). You may have this done  every 1-3 years.  Abdominal aortic aneurysm (AAA) screening. You may need this if you are a current or former smoker.  Sexually transmitted disease (STD) testing. Follow these instructions at home: Eating and drinking  Eat a diet that includes fresh fruits and vegetables, whole grains, lean protein, and low-fat dairy products. Limit your  intake of foods with high amounts of sugar, saturated fats, and salt.  Take vitamin and mineral supplements as recommended by your health care provider.  Do not drink alcohol if your health care provider tells you not to drink.  If you drink alcohol: ? Limit how much you have to 0-2 drinks a day. ? Be aware of how much alcohol is in your drink. In the [place] one drink equals one 12 oz bottle of beer (355 mL), one 5 oz glass of wine (148 mL), or one 1 oz glass of hard liquor (44 mL). Lifestyle  Take daily care of your teeth and gums.  Stay active. Exercise for at least 30 minutes on 5 or more days each week.  Do not use any products that contain nicotine or tobacco, such as cigarettes, e-cigarettes, and chewing tobacco. If you need help quitting, ask your health care provider.  If you are sexually active, practice safe sex. Use a condom or other form of protection to prevent STIs (sexually transmitted infections).  Talk with your health care provider about taking a low-dose aspirin or statin. What's next?  Visit your health care provider once a year for a well check visit.  Ask your health care provider how often you should have your eyes and teeth checked.  Stay up to date on all vaccines. This information is not intended to replace advice given to you by your health care provider. Make sure you discuss any questions you have with your health care provider. Document Revised: 02/13/2018 Document Reviewed: 02/13/2018 Elsevier Patient Education  2020 [employer].

## 2019-06-23 NOTE — Progress Notes (Signed)
Established Patient Office Visit     This visit occurred during the SARS-CoV-2 public health emergency.  Safety protocols were in place, including screening questions prior to the visit, additional usage of staff PPE, and extensive cleaning of exam room while observing appropriate contact time as indicated for disinfecting solutions.    CC/Reason for Visit: Annual preventive exam, preop clearance, ED follow-up  HPI: Jose [name] is a 69 y.o. male who is coming in today for the above mentioned reasons. Past Medical History is significant for: Hypertension that has been well controlled on bisoprolol and benazepril,type 2 diabetes well controlled on metformin,hyperlipidemia on atorvastatin 20 mg, he does have some mild anxiety for which he takes alprazolam pretty much on a daily basis once a day in the morning, has a history of GERD but has been off medication for a few months and has not had anysymptoms, also has a history of colon polyps and is on a q. 5-year colonoscopy schedule, last done in July 2019.   He has been having continued issues with his right knee and has been scheduled preemptively for a right total knee replacement on May 3.  This past week he has been having a lot of left lower sided abdominal pain.  On Saturday night and Sunday morning he describes pain as "excruciating".  Ended up going to the emergency department where he was diagnosed to have diverticulitis by CT scan.  He was advised a low residue diet and was prescribed Augmentin for 10 days.  He is currently undergoing antibiotics, states his belly pain, although still present, is significantly improved.  He has been having somewhat elevated blood pressures.  He brings in his log today.  Some measurements as follows:  171/101 143/88 126/81 167/104 136/89 155/91 129/90 164/100 150/90 149/93  He recently saw his cardiologist, Dr. Burt Knack, he is aware of these numbers and has recommended follow-up visit  after surgery for potential medication adjustment   Past Medical/Surgical History: Past Medical History:  Diagnosis Date  . ALCOHOL ABUSE 07/27/2008  . Allergy   . ANXIETY 07/27/2008  . Arthritis   . Bifascicular block    beta blocker stopped due to profound bradycardia  . CAD 07/27/2008   a. s/p Endeavor DES to CFX 07/2008; residual OM1 70%, EF 45%;  b. relook cath 5/10: patent stent in CFX;  c.  LHC (8/15):  prox LAD 30%, OM1 50% at bifurcation, OM1 sub-branch 50%, mid AVCFX stent patent, EF 55-65% - no change from 2010 >>> Med Rx (after abnormal nuc)   . Cataract   . Diabetes mellitus without complication ([place])   . GERD 09/18/2006  . History of TIA (transient ischemic attack) may 2010  . Hx of adenomatous colonic polyps   . Hx of cardiovascular stress test    ETT-Myoview (09/2013):  Partially fixed defect with some reversibility - cannot r/o inf ischemia vs diaph atten, EF 51%; mod risk  . Hyperlipidemia   . HYPERTENSION 09/18/2006  . LOW BACK PAIN 09/18/2006  . Myocardial infarction ([place]) 2010  . Numbness and tingling    finger tips at times  . Rosacea 07/27/2008  . SEIZURE DISORDER 09/18/2006   none since 2008  . 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  . Urinary tract infection start cipro 01-07-2012    Past Surgical History:  Procedure Laterality Date  . CORONARY STENT PLACEMENT     drug eluting  . HERNIA REPAIR    . INGUINAL HERNIA REPAIR  01/14/2012   Procedure: LAPAROSCOPIC  INGUINAL HERNIA;  Surgeon: Ralene Ok, MD;  Location: [facility];  Service: General;  Laterality: Right;  . INSERTION OF MESH  01/14/2012   Procedure: INSERTION OF MESH;  Surgeon: Ralene Ok, MD;  Location: [facility];  Service: General;  Laterality: Right;  . KNEE SURGERY    . LEFT HEART CATHETERIZATION WITH CORONARY ANGIOGRAM N/A 10/21/2013   Procedure: LEFT HEART CATHETERIZATION WITH CORONARY ANGIOGRAM;  Surgeon: Blane Ohara, MD;  Location: [facility] CATH LAB;  Service: Cardiovascular;  Laterality: N/A;  . MEDIAL  PARTIAL KNEE REPLACEMENT Left     Social History:  reports that he has never smoked. He quit smokeless tobacco use about 7 years ago.  His smokeless tobacco use included chew. He reports that he does not drink alcohol or use drugs.  Allergies: No Known Allergies  Family History:  Family History  Problem Relation Age of Onset  . Lung cancer Mother   . Hypertension Mother   . Diabetes Mother   . Hyperlipidemia Mother   . Emphysema Father   . Heart failure Father   . Heart attack Other   . Colon cancer Neg Hx   . Stroke Neg Hx   . Esophageal cancer Neg Hx   . Liver cancer Neg Hx   . Pancreatic cancer Neg Hx   . Stomach cancer Neg Hx   . Rectal cancer Neg Hx      Current Outpatient Medications:  .  ALPRAZolam (XANAX) 0.5 MG tablet, TAKE 1 TABLET BY MOUTH THREE TIMES A DAY AS NEEDED, Disp: 60 tablet, Rfl: 1 .  amoxicillin-clavulanate (AUGMENTIN) 875-125 MG tablet, Take 1 tablet by mouth every 12 (twelve) hours for 10 days., Disp: 19 tablet, Rfl: 0 .  atorvastatin (LIPITOR) 20 MG tablet, TAKE 1 TABLET BY MOUTH EVERY DAY (Patient taking differently: Take 20 mg by mouth daily. ), Disp: 30 tablet, Rfl: 0 .  Azelaic Acid 15 % cream, APPLY 1 APPLICATION TOPICALLY AS DIRECTED. AFTER SKIN IS THOROUGHLY WASHED AND PATTED DRY, GENTLY BUT THOROUGHLY MASSAGE A THIN FILM OF AZELAIC ACID INTO THE AFFECTED AREA TWICE DAILY, IN THE MORNING AND EVENING. APPLIED TO FACE. (Patient taking differently: Apply 1 application topically 2 (two) times daily as needed (rosacea). ), Disp: 50 g, Rfl: 0 .  BAYER LOW DOSE 81 MG chewable tablet, Chew 81 mg by mouth daily., Disp: , Rfl:  .  benazepril (LOTENSIN) 40 MG tablet, TAKE 1 TABLET BY MOUTH EVERY DAY (Patient taking differently: Take 40 mg by mouth daily. ), Disp: 90 tablet, Rfl: 1 .  bisoprolol (ZEBETA) 5 MG tablet, TAKE 1 TABLET BY MOUTH EVERY DAY (Patient taking differently: Take 5 mg by mouth daily. ), Disp: 90 tablet, Rfl: 0 .  cholecalciferol (VITAMIN D)  25 MCG (1000 UNIT) tablet, Take 1,000 Units by mouth daily., Disp: , Rfl:  .  diclofenac sodium (VOLTAREN) 1 % GEL, Apply 2 g topically 2 (two) times daily as needed (joint pain (knees)). , Disp: , Rfl:  .  glucose blood (ONETOUCH VERIO) test strip, 1 each by Other route daily. DX E11.51 - ONE TOUCH VERIO, Disp: 100 each, Rfl: 12 .  HYDROcodone-acetaminophen (NORCO/VICODIN) 5-325 MG tablet, Take 1 tablet by mouth every 6 (six) hours as needed., Disp: 6 tablet, Rfl: 0 .  metFORMIN (GLUCOPHAGE) 1000 MG tablet, Take 1,000 mg by mouth 2 (two) times daily with a meal., Disp: , Rfl:  .  Multiple Vitamin (MULTIVITAMIN WITH MINERALS) TABS, Take 1 tablet by mouth daily., Disp: , Rfl:  .  nitroGLYCERIN (NITROSTAT) 0.4 MG SL tablet, DISSOLVE 1 TABLET BY MOUTH UNDER THE TONGUE EVERY 5 MINUTES AS NEEDED FOR CHEST PAIN (Patient taking differently: Place 0.4 mg under the tongue every 5 (five) minutes as needed for chest pain. ), Disp: 75 tablet, Rfl: 5 .  OneTouch Delica Lancets 35T MISC, 1 each by Does not apply route daily., Disp: 100 each, Rfl: 4 .  vitamin C (ASCORBIC ACID) 500 MG tablet, Take 500 mg by mouth daily. Reported on 08/23/2015, Disp: , Rfl:  .  tadalafil (CIALIS) 20 MG tablet, Take 1 tablet (20 mg total) by mouth daily as needed for erectile dysfunction., Disp: 20 tablet, Rfl: 5  Current Facility-Administered Medications:  .  0.9 %  sodium chloride infusion, 500 mL, Intravenous, Once, Milus Banister, MD  Review of Systems:  Constitutional: Denies fever, chills, diaphoresis, appetite change and fatigue.  HEENT: Denies photophobia, eye pain, redness, hearing loss, ear pain, congestion, sore throat, rhinorrhea, sneezing, mouth sores, trouble swallowing, neck pain, neck stiffness and tinnitus.   Respiratory: Denies SOB, DOE, cough, chest tightness,  and wheezing.   Cardiovascular: Denies chest pain, palpitations and leg swelling.  Gastrointestinal: Denies nausea, vomiting, diarrhea, constipation,  blood in stool and abdominal distention.  Genitourinary: Denies dysuria, urgency, frequency, hematuria, flank pain and difficulty urinating.  Endocrine: Denies: hot or cold intolerance, sweats, changes in hair or nails, polyuria, polydipsia. Musculoskeletal: Denies myalgias, back pain, joint swelling, arthralgias and gait problem.  Skin: Denies pallor, rash and wound.  Neurological: Denies dizziness, seizures, syncope, weakness, light-headedness, numbness and headaches.  Hematological: Denies adenopathy. Easy bruising, personal or family bleeding history  Psychiatric/Behavioral: Denies suicidal ideation, mood changes, confusion, nervousness, sleep disturbance and agitation    Physical Exam: Vitals:   06/23/19 0707  BP: (!) 160/80  Pulse: 81  Temp: (!) 97.4 F (36.3 C)  TempSrc: Temporal  SpO2: 93%  Weight: 184 lb 1.6 oz (83.5 kg)  Height: 5' 7"  (1.702 m)    Body mass index is 28.83 kg/m.   Constitutional: NAD, calm, comfortable Eyes: PERRL, lids and conjunctivae normal ENMT: Mucous membranes are moist. Tympanic membrane is pearly white, no erythema or bulging. Neck: normal, supple, no masses, no thyromegaly Respiratory: clear to auscultation bilaterally, no wheezing, no crackles. Normal respiratory effort. No accessory muscle use.  Cardiovascular: Regular rate and rhythm, no murmurs / rubs / gallops. No extremity edema. 2+ pedal pulses. Abdomen: Some tenderness to the left lower quadrant on palpation, no masses palpated. No hepatosplenomegaly. Bowel sounds positive.  Musculoskeletal: no clubbing / cyanosis. No joint deformity upper and lower extremities. Good ROM, no contractures. Normal muscle tone.  Skin: no rashes, lesions, ulcers. No induration Neurologic: CN 2-12 grossly intact. Sensation intact, DTR normal. Strength 5/5 in all 4.  Psychiatric: Normal judgment and insight. Alert and oriented x 3. Normal mood.    Impression and Plan:  Encounter for preventive health  examination  -He has routine eye and dental care. -He just completed his Covid vaccination series on March 22, he is due for a Pneumovax, however will need to wait 6 weeks after last Covid. -Screening labs today. -Healthy lifestyle has been discussed in detail. -PSA today for prostate cancer screening. -He had a colonoscopy in 2019 and is a 5-year follow-up due to history of colon polyps.  Diabetes mellitus with peripheral vascular disease (HCC) -Last A1c was 5.4 in 2019, check A1c today.  Dyslipidemia  -Check lipids today, no LDL on file.  He is on atorvastatin.  Personal history of  colonic polyps -Every 5 year colonoscopy schedule.  Essential hypertension -Blood pressure is above goal today. -This is managed by cardiology. -They are planning on a close follow-up visit for blood pressure medication adjustment.  Gastroesophageal reflux disease without esophagitis -Asymptomatic, not on medications.  Atherosclerosis of native coronary artery of native heart without angina pectoris -Stable, no chest pain, no shortness of breath, followed by cardiology.  Pre-operative clearance -As long as no significant increase in A1c, which I doubt, agree with cardiology that he is okay to proceed to surgery without further intervention. -I have advised him to discuss with his surgeon his recent episode of diverticulitis in case they would like to delay the surgery a little because of this.  Hospital discharge follow-up -Was diagnosed with diverticulitis, is currently completing a 10-day course of Augmentin. -Abdominal pain has improved, he has no vomiting or nausea currently, states that his bowel movements are regular. -We have discussed the low residue diet.    Patient Instructions  -Nice seeing you today!!  -Lab work today; will notify you once results are available.  -Schedule follow up in 6 months; sooner if you will not be seeing Dr. Burt Knack for blood pressure management.    Preventive Care 28 Years and Older, Male Preventive care refers to lifestyle choices and visits with your health care provider that can promote health and wellness. This includes:  A yearly physical exam. This is also called an annual well check.  Regular dental and eye exams.  Immunizations.  Screening for certain conditions.  Healthy lifestyle choices, such as diet and exercise. What can I expect for my preventive care visit? Physical exam Your health care provider will check:  Height and weight. These may be used to calculate body mass index (BMI), which is a measurement that tells if you are at a healthy weight.  Heart rate and blood pressure.  Your skin for abnormal spots. Counseling Your health care provider may ask you questions about:  Alcohol, tobacco, and drug use.  Emotional well-being.  Home and relationship well-being.  Sexual activity.  Eating habits.  History of falls.  Memory and ability to understand (cognition).  Work and work Statistician. What immunizations do I need?  Influenza (flu) vaccine  This is recommended every year. Tetanus, diphtheria, and pertussis (Tdap) vaccine  You may need a Td booster every 10 years. Varicella (chickenpox) vaccine  You may need this vaccine if you have not already been vaccinated. Zoster (shingles) vaccine  You may need this after age 73. Pneumococcal conjugate (PCV13) vaccine  One dose is recommended after age 65. Pneumococcal polysaccharide (PPSV23) vaccine  One dose is recommended after age 45. Measles, mumps, and rubella (MMR) vaccine  You may need at least one dose of MMR if you were born in 1957 or later. You may also need a second dose. Meningococcal conjugate (MenACWY) vaccine  You may need this if you have certain conditions. Hepatitis A vaccine  You may need this if you have certain conditions or if you travel or work in places where you may be exposed to hepatitis A. Hepatitis B vaccine   You may need this if you have certain conditions or if you travel or work in places where you may be exposed to hepatitis B. Haemophilus influenzae type b (Hib) vaccine  You may need this if you have certain conditions. You may receive vaccines as individual doses or as more than one vaccine together in one shot (combination vaccines). Talk with your health care provider  about the risks and benefits of combination vaccines. What tests do I need? Blood tests  Lipid and cholesterol levels. These may be checked every 5 years, or more frequently depending on your overall health.  Hepatitis C test.  Hepatitis B test. Screening  Lung cancer screening. You may have this screening every year starting at age 68 if you have a 30-pack-year history of smoking and currently smoke or have quit within the past 15 years.  Colorectal cancer screening. All adults should have this screening starting at age 1 and continuing until age 42. Your health care provider may recommend screening at age 58 if you are at increased risk. You will have tests every 1-10 years, depending on your results and the type of screening test.  Prostate cancer screening. Recommendations will vary depending on your family history and other risks.  Diabetes screening. This is done by checking your blood sugar (glucose) after you have not eaten for a while (fasting). You may have this done every 1-3 years.  Abdominal aortic aneurysm (AAA) screening. You may need this if you are a current or former smoker.  Sexually transmitted disease (STD) testing. Follow these instructions at home: Eating and drinking  Eat a diet that includes fresh fruits and vegetables, whole grains, lean protein, and low-fat dairy products. Limit your intake of foods with high amounts of sugar, saturated fats, and salt.  Take vitamin and mineral supplements as recommended by your health care provider.  Do not drink alcohol if your health care provider  tells you not to drink.  If you drink alcohol: ? Limit how much you have to 0-2 drinks a day. ? Be aware of how much alcohol is in your drink. In the [place] one drink equals one 12 oz bottle of beer (355 mL), one 5 oz glass of wine (148 mL), or one 1 oz glass of hard liquor (44 mL). Lifestyle  Take daily care of your teeth and gums.  Stay active. Exercise for at least 30 minutes on 5 or more days each week.  Do not use any products that contain nicotine or tobacco, such as cigarettes, e-cigarettes, and chewing tobacco. If you need help quitting, ask your health care provider.  If you are sexually active, practice safe sex. Use a condom or other form of protection to prevent STIs (sexually transmitted infections).  Talk with your health care provider about taking a low-dose aspirin or statin. What's next?  Visit your health care provider once a year for a well check visit.  Ask your health care provider how often you should have your eyes and teeth checked.  Stay up to date on all vaccines. This information is not intended to replace advice given to you by your health care provider. Make sure you discuss any questions you have with your health care provider. Document Revised: 02/13/2018 Document Reviewed: 02/13/2018 Elsevier Patient Education  2020 [place], MD [facility]

## 2019-07-03 ENCOUNTER — Other Ambulatory Visit: Payer: Self-pay | Admitting: Internal Medicine

## 2019-07-03 ENCOUNTER — Encounter: Payer: 59 | Admitting: Internal Medicine

## 2019-07-03 DIAGNOSIS — R972 Elevated prostate specific antigen [PSA]: Secondary | ICD-10-CM

## 2019-07-04 ENCOUNTER — Other Ambulatory Visit: Payer: Self-pay | Admitting: Internal Medicine

## 2019-07-05 ENCOUNTER — Other Ambulatory Visit: Payer: Self-pay | Admitting: Internal Medicine

## 2019-07-06 ENCOUNTER — Telehealth: Payer: Self-pay | Admitting: Cardiovascular Disease

## 2019-07-06 MED ORDER — AMLODIPINE BESYLATE 5 MG PO TABS: 5.0000 mg | ORAL_TABLET | Freq: Every day | ORAL | 3 refills | Status: DC

## 2019-07-06 NOTE — Telephone Encounter (Signed)
Per Dr. Burt Knack, "Reviewed recent office note and medication list. Recommend add amlodipine 5 mg daily at least until he recovers from surgery. Recommend take all medicines morning of surgery when he gets rescheduled."  Spoke with patient and reviewed instructions. Amlodipine 5 mg qd called in.  Note sent to Dr. Alvan Dame as Juluis Rainier.

## 2019-07-06 NOTE — Telephone Encounter (Signed)
Pt states he was denied knee replacement surgery due to his hypertension. It was scheduled for today. He took his BP when he got up at 3am and it was 162/103.  This morning he went in to be prepped and his BP readings were:   204/111 204/115 200/114  At this point he was sent home d/t his hypertension. He states he had not taken any medications this morning because he was NPO and was told by his surgeon to hold all meds prior to his operation. Once he was sent home he went ahead and took his all pf his usual meds. Pt took his BP on the phone and it was  184/107. He reports that it has been about 30 minutes since taking his medications.

## 2019-07-06 NOTE — Telephone Encounter (Signed)
Pt c/o medication issue:  1. Name of Medication: atorvastatin (LIPITOR) 20 MG tablet  2. How are you currently taking this medication (dosage and times per day)? 20 mg daily  3. Are you having a reaction (difficulty breathing--STAT)? no  4. What is your medication issue? Pt was denied for knee replacement surgery because his BP was too high. Dr. Burt Knack talked about adjusting his medication after his surgery, but he would like to come in sooner so he can have his surgery

## 2019-08-02 ENCOUNTER — Other Ambulatory Visit: Payer: Self-pay | Admitting: Internal Medicine

## 2019-09-08 ENCOUNTER — Telehealth: Payer: Self-pay | Admitting: Internal Medicine

## 2019-09-08 MED ORDER — AMLODIPINE BESYLATE 5 MG PO TABS: 5.0000 mg | ORAL_TABLET | Freq: Every day | ORAL | 1 refills | Status: DC

## 2019-09-08 NOTE — Telephone Encounter (Signed)
Pt is having surgery on July 19th and the physician doing the surgery needs a lab test for pre-surgery testing CMP, CBC, and PT-INR. And then fax the results to [facility] [address] [phone])  Pt is aware to PCP is out of office   Pt can be reached at [phone] ext [phone] (8am-5pm)  (After 5pm) [phone]

## 2019-09-08 NOTE — Telephone Encounter (Signed)
Okay to order?

## 2019-09-08 NOTE — Addendum Note (Signed)
Addended by: Westley Hummer B on: 09/08/2019 04:27 PM   Modules accepted: Orders

## 2019-09-08 NOTE — Telephone Encounter (Signed)
Pt called back and stated he is needing a refill on one of his medications.   Medication Refill:  Amlodipine   Pharmacy:  [id number] - [place], [place] - [address] Phone:  [phone]  Fax:  [phone]

## 2019-09-08 NOTE — Telephone Encounter (Signed)
Refill sent.

## 2019-09-09 NOTE — Telephone Encounter (Signed)
Can't the physician requesting the labs order them?!

## 2019-09-09 NOTE — Telephone Encounter (Signed)
Okay to order the labs requested?

## 2019-09-09 NOTE — Telephone Encounter (Signed)
Attempted to call patient but unable to leave a message. 

## 2019-09-14 NOTE — Telephone Encounter (Signed)
The patient called about scheduling his labs for CMP, CBC, and PT INR  I spoke with Roselyn Reef and she suggested that he goes to [place]  The patient said that [facility] [place] office does not have labs at their location.  The patient already had someone draw for the CBC and the CMP on Friday now he just needs his PT INR done.   He's just needs the labs done before surgery on July 19.

## 2019-09-15 NOTE — Telephone Encounter (Signed)
Okay to order PT/INR lab?

## 2019-10-08 ENCOUNTER — Telehealth: Payer: Self-pay | Admitting: Internal Medicine

## 2019-10-08 MED ORDER — ONETOUCH VERIO VI STRP: 1.0000 | ORAL_STRIP | Freq: Every day | 12 refills | Status: DC

## 2019-10-08 NOTE — Telephone Encounter (Signed)
Pt is calling in stating that he is out of his OneTouch Verio test strips.  [facility] [place], [place]

## 2019-10-08 NOTE — Telephone Encounter (Signed)
Refill sent.

## 2019-10-19 NOTE — Progress Notes (Signed)
Patient ID: Jose [name]                 DOB: Oct 26, 1950                      MRN: [id number]     HPI: Jose [name] is a 69 y.o. male referred by Dr. Burt Knack to HTN clinic. PMH is significant for HTN, T2DM, HLD, CAD w/drug-eluting stent, anxiety, GERD. At last office visit on 06/15/19, Dr. Burt Knack reported patient has history of white coat hypertension (clinic BP was elevated) and home SBP ranged from 130-140s possibly due to regular use of NSAIDs because of knee pain. No med changes were made. Patient was originally denied knee replacement surgery in May 2021 due to elevated BP and home BP readings were elevated 200s/110s. Therefore, amlodipine 5 mg daily was added by Dr. Burt Knack. Then, knee replacement surgery was rescheduled for 09/21/2019.   Patient presents today in good spirits. Reports medication adherence with amlodipine, benazepril, and bisoprolol, and took his morning meds today. Reports fatigue and right leg swelling secondary to recent right knee replacement surgery in July. Denies headaches, dizziness, and blurred vision. Home BP readings range from 96-130s/70-90s with a high of 164/97 and low of 96/67; this morning BP was 134/81 (see below). HR ranges from 90s-130s with a high of 150 and 137, and low of 80 (see below). Patient also reports that he checks BP/HR about 5 minutes after taking his morning meds.   Current HTN meds:  Benazepril 40 mg daily  Bisoprolol 5 mg daily  Amlodipine 5 mg daily  BP goal: < 130/80 mmHg  Family History: Mother with history of HTN, DM, HLD, and lung cancer. Father with MI and emphysema.  Social History: Former tobacco user (chewing tobacco) - quit 2013  Diet:  Breakfast: boiled egg, bacon Lunch: sandwich Dinner: vegetables  Drink: lipton green tea and water, 1 cup of coffee  Exercise: Recent knee replacement - attends physical therapy rehab twice a week   Home BP readings: Month of August BP: 110/84, 108/74, 120/84, 110/78, 118/91, 95/66, 135/87,  120/89, 164/97, 96/67, 118/88, 118/86, 125/89, 107/75, 145/87, 130/85, 134/81  HR: 124, 113, 92, 103, 95, 125, 137, 128, 115, 93, 135, 150, 116, 80, 127, 115, 120  Wt Readings from Last 3 Encounters:  06/23/19 184 lb 1.6 oz (83.5 kg)  06/21/19 180 lb (81.6 kg)  06/15/19 188 lb (85.3 kg)   BP Readings from Last 3 Encounters:  10/20/19 118/70  06/23/19 (!) 160/80  06/21/19 (!) 176/88   Pulse Readings from Last 3 Encounters:  10/20/19 72  06/23/19 81  06/21/19 89    Renal function: CrCl cannot be calculated (Patient's most recent lab result is older than the maximum 21 days allowed.).  Past Medical History:  Diagnosis Date  . ALCOHOL ABUSE 07/27/2008  . Allergy   . ANXIETY 07/27/2008  . Arthritis   . Bifascicular block    beta blocker stopped due to profound bradycardia  . CAD 07/27/2008   a. s/p Endeavor DES to CFX 07/2008; residual OM1 70%, EF 45%;  b. relook cath 5/10: patent stent in CFX;  c.  LHC (8/15):  prox LAD 30%, OM1 50% at bifurcation, OM1 sub-branch 50%, mid AVCFX stent patent, EF 55-65% - no change from 2010 >>> Med Rx (after abnormal nuc)   . Cataract   . Diabetes mellitus without complication ([place])   . GERD 09/18/2006  . History of TIA (transient ischemic attack)  may 2010  . Hx of adenomatous colonic polyps   . Hx of cardiovascular stress test    ETT-Myoview (09/2013):  Partially fixed defect with some reversibility - cannot r/o inf ischemia vs diaph atten, EF 51%; mod risk  . Hyperlipidemia   . HYPERTENSION 09/18/2006  . LOW BACK PAIN 09/18/2006  . Myocardial infarction ([place]) 2010  . Numbness and tingling    finger tips at times  . Rosacea 07/27/2008  . SEIZURE DISORDER 09/18/2006   none since 2008  . 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  . Urinary tract infection start cipro 01-07-2012    Current Outpatient Medications on File Prior to Visit  Medication Sig Dispense Refill  . ALPRAZolam (XANAX) 0.5 MG tablet TAKE 1 TABLET BY MOUTH THREE TIMES A DAY AS NEEDED 60 tablet  1  . amLODipine (NORVASC) 5 MG tablet Take 1 tablet (5 mg total) by mouth daily. 90 tablet 1  . atorvastatin (LIPITOR) 20 MG tablet TAKE 1 TABLET BY MOUTH EVERY DAY 90 tablet 1  . Azelaic Acid 15 % cream APPLY 1 APPLICATION TOPICALLY AS DIRECTED. AFTER SKIN IS THOROUGHLY WASHED AND PATTED DRY, GENTLY BUT THOROUGHLY MASSAGE A THIN FILM OF AZELAIC ACID INTO THE AFFECTED AREA TWICE DAILY, IN THE MORNING AND EVENING. APPLIED TO FACE. (Patient taking differently: Apply 1 application topically 2 (two) times daily as needed (rosacea). ) 50 g 0  . BAYER LOW DOSE 81 MG chewable tablet Chew 81 mg by mouth daily.    . benazepril (LOTENSIN) 40 MG tablet TAKE 1 TABLET BY MOUTH EVERY DAY (Patient taking differently: Take 40 mg by mouth daily. ) 90 tablet 1  . bisoprolol (ZEBETA) 5 MG tablet TAKE 1 TABLET BY MOUTH EVERY DAY 90 tablet 1  . cholecalciferol (VITAMIN D) 25 MCG (1000 UNIT) tablet Take 1,000 Units by mouth daily.    . diclofenac sodium (VOLTAREN) 1 % GEL Apply 2 g topically 2 (two) times daily as needed (joint pain (knees)).     Marland Kitchen glucose blood (ONETOUCH VERIO) test strip 1 each by Other route daily. DX E11.51 - ONE TOUCH VERIO 100 each 12  . HYDROcodone-acetaminophen (NORCO/VICODIN) 5-325 MG tablet Take 1 tablet by mouth every 6 (six) hours as needed. 6 tablet 0  . metFORMIN (GLUCOPHAGE) 1000 MG tablet TAKE 1 TABLET (1,000 MG TOTAL) BY MOUTH 2 (TWO) TIMES DAILY WITH A MEAL. 180 tablet 0  . Multiple Vitamin (MULTIVITAMIN WITH MINERALS) TABS Take 1 tablet by mouth daily.    . nitroGLYCERIN (NITROSTAT) 0.4 MG SL tablet DISSOLVE 1 TABLET BY MOUTH UNDER THE TONGUE EVERY 5 MINUTES AS NEEDED FOR CHEST PAIN (Patient taking differently: Place 0.4 mg under the tongue every 5 (five) minutes as needed for chest pain. ) 75 tablet 5  . OneTouch Delica Lancets 81W MISC 1 each by Does not apply route daily. 100 each 4  . tadalafil (CIALIS) 20 MG tablet Take 1 tablet (20 mg total) by mouth daily as needed for erectile  dysfunction. 20 tablet 5  . vitamin C (ASCORBIC ACID) 500 MG tablet Take 500 mg by mouth daily. Reported on 08/23/2015     Current Facility-Administered Medications on File Prior to Visit  Medication Dose Route Frequency Provider Last Rate Last Admin  . 0.9 %  sodium chloride infusion  500 mL Intravenous Once Milus Banister, MD        No Known Allergies   Assessment/Plan:  1. Hypertension -  BP at goal of < 130/80 mmHg, however HR at home is  elevated. Home HR log ranges from 90s-130s (average in 110s) with a high of 150, 137, 135 and low of 80, 92, 93. Will increase bisoprolol to 10 mg daily for better HR control. Instructed patient to contact [facility] if his SBPs drop to the 90s on a consistent basis or experiences lightheadedness or additional fatigue. Explained to check BP at least 30 mins to 1 hour after taking BP medications and to bring his blood pressure monitor to his next appointment. Patient verbalized understanding. Encouraged patient to eat a healthy diet composed of lean meat (chicken, [place]), vegetables, and fruits, and to limit red meat and salt. Encouraged patient to exercise to the best of his abilities due to recent knee replacement surgery and continue to attend physical therapy. Follow-up appointment scheduled in 3 weeks in HTN clinic.  Lorel Monaco, PharmD PGY2 [facility] [address] [place], [place] [address] Phone: [phone]; Fax: [phone]

## 2019-10-19 NOTE — Patient Instructions (Addendum)
It was nice to see you today!  Your goal blood pressure is <130/80 mmHg. In clinic, your blood pressure was 118/70 mmHg.  Medication Changes: Begin taking Bisoprolol 10 mg daily to help control your heart rate  Continue Amlodipine 5 mg daily and Benazepril 40 mg daily  Monitor blood pressure at home daily and keep a log (on your phone or piece of paper) to bring with you to your next visit. Write down date, time, blood pressure and pulse.  Keep up the good work with diet and exercise. Aim for a diet full of vegetables, fruit and lean meats (chicken, [place], fish). Try to limit salt intake by eating fresh or frozen vegetables (instead of canned), rinse canned vegetables prior to cooking and do not add any additional salt to meals.   Please give [place] a call at [phone] with any questions or concerns.

## 2019-10-20 ENCOUNTER — Encounter: Payer: Self-pay | Admitting: Pharmacist

## 2019-10-20 ENCOUNTER — Ambulatory Visit (INDEPENDENT_AMBULATORY_CARE_PROVIDER_SITE_OTHER): Payer: 59 | Admitting: Pharmacist

## 2019-10-20 ENCOUNTER — Other Ambulatory Visit: Payer: Self-pay

## 2019-10-20 VITALS — BP 118/70 | HR 72

## 2019-10-20 DIAGNOSIS — I10 Essential (primary) hypertension: Secondary | ICD-10-CM

## 2019-10-20 MED ORDER — BISOPROLOL FUMARATE 10 MG PO TABS: 10.0000 mg | ORAL_TABLET | Freq: Every day | ORAL | 5 refills | Status: DC

## 2019-10-22 ENCOUNTER — Other Ambulatory Visit: Payer: Self-pay | Admitting: Internal Medicine

## 2019-10-30 ENCOUNTER — Other Ambulatory Visit: Payer: Self-pay | Admitting: Internal Medicine

## 2019-11-09 NOTE — Progress Notes (Signed)
Patient ID: Jose [name]                 DOB: 1950-12-29                      MRN: [id number]     HPI: Jose [name] is a 69 y.o. male referred by Dr. Burt Knack to HTN clinic. PMH is significant for HTN, T2DM, HLD, CAD w/drug-eluting stent, anxiety, GERD. At last visit in HTN clinic on 10/20/19, BP was at goal however HR was elevated from 90-150s. Bisoprolol was increased from 5 mg to 10 mg daily for HR control.  Patient presents today in good spirits for his 3-week HTN follow-up visit. Reports medication adherence and no side effects with benazepril 40 mg daily, bisoprolol 10 mg daily, and amlodipine 5 mg daily. Denies dizziness, headaches, blurred vision, lightheadedness, fatigue, and chest palpitations. Reports consistency with right lower leg extremity swelling secondary to knee replacement surgery and that fluid was drained from knee last Thursday (11/05/19) with [facility]. Reports swelling worsens when standing for long periods of time. Reports home BP ranges from 95-110s/70-80s with a reading of 111/68 this morning and HR 70-110s (see below).   Current HTN meds:  Benazepril 40 mg daily (AM) Bisoprolol 10 mg daily (AM) Amlodipine 5 mg daily (AM)  BP goal: < 130/80 mmHg  Family History: Mother with history of HTN, DM, HLD, and lung cancer. Father with MI and emphysema.  Social History: Former tobacco user (chewing tobacco) - quit 2013  Diet:  Breakfast: boiled egg, bacon Lunch: sandwich Dinner: vegetables  Drink: lipton green tea and water, 1 cup of coffee  Exercise: Recent knee replacement - attends physical therapy rehab twice a week:   Home BP readings: From 8/24 - 9/7  BP: 95/72, 95/78, 94/64, 106/71, 105/70, 110/78, 119/78, 119/80, 94/67, 92/70, 114/82, 118/81, 138/85, 107/69, 132/82, 111/68  HR 70-110s with high of 116, 112 and lows of 74 and 80  Wt Readings from Last 3 Encounters:  06/23/19 184 lb 1.6 oz (83.5 kg)  06/21/19 180 lb (81.6 kg)  06/15/19 188 lb (85.3 kg)    BP Readings from Last 3 Encounters:  11/10/19 124/70  10/20/19 118/70  06/23/19 (!) 160/80   Pulse Readings from Last 3 Encounters:  11/10/19 71  10/20/19 72  06/23/19 81    Renal function: CrCl cannot be calculated (Patient's most recent lab result is older than the maximum 21 days allowed.).  Past Medical History:  Diagnosis Date  . ALCOHOL ABUSE 07/27/2008  . Allergy   . ANXIETY 07/27/2008  . Arthritis   . Bifascicular block    beta blocker stopped due to profound bradycardia  . CAD 07/27/2008   a. s/p Endeavor DES to CFX 07/2008; residual OM1 70%, EF 45%;  b. relook cath 5/10: patent stent in CFX;  c.  LHC (8/15):  prox LAD 30%, OM1 50% at bifurcation, OM1 sub-branch 50%, mid AVCFX stent patent, EF 55-65% - no change from 2010 >>> Med Rx (after abnormal nuc)   . Cataract   . Diabetes mellitus without complication ([place])   . GERD 09/18/2006  . History of TIA (transient ischemic attack) may 2010  . Hx of adenomatous colonic polyps   . Hx of cardiovascular stress test    ETT-Myoview (09/2013):  Partially fixed defect with some reversibility - cannot r/o inf ischemia vs diaph atten, EF 51%; mod risk  . Hyperlipidemia   . HYPERTENSION 09/18/2006  . LOW BACK PAIN  09/18/2006  . Myocardial infarction ([place]) 2010  . Numbness and tingling    finger tips at times  . Rosacea 07/27/2008  . SEIZURE DISORDER 09/18/2006   none since 2008  . 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  . Urinary tract infection start cipro 01-07-2012    Current Outpatient Medications on File Prior to Visit  Medication Sig Dispense Refill  . ALPRAZolam (XANAX) 0.5 MG tablet TAKE 1 TABLET BY MOUTH THREE TIMES A DAY AS NEEDED 60 tablet 1  . amLODipine (NORVASC) 5 MG tablet Take 1 tablet (5 mg total) by mouth daily. 90 tablet 1  . atorvastatin (LIPITOR) 20 MG tablet TAKE 1 TABLET BY MOUTH EVERY DAY 90 tablet 1  . Azelaic Acid 15 % cream APPLY 1 APPLICATION TOPICALLY AS DIRECTED. AFTER SKIN IS THOROUGHLY WASHED AND PATTED DRY,  GENTLY BUT THOROUGHLY MASSAGE A THIN FILM OF AZELAIC ACID INTO THE AFFECTED AREA TWICE DAILY, IN THE MORNING AND EVENING. APPLIED TO FACE. (Patient taking differently: Apply 1 application topically 2 (two) times daily as needed (rosacea). ) 50 g 0  . BAYER LOW DOSE 81 MG chewable tablet Chew 81 mg by mouth daily.    . benazepril (LOTENSIN) 40 MG tablet TAKE 1 TABLET BY MOUTH EVERY DAY 90 tablet 1  . bisoprolol (ZEBETA) 10 MG tablet Take 1 tablet (10 mg total) by mouth daily. 30 tablet 5  . cholecalciferol (VITAMIN D) 25 MCG (1000 UNIT) tablet Take 1,000 Units by mouth daily.    . diclofenac sodium (VOLTAREN) 1 % GEL Apply 2 g topically 2 (two) times daily as needed (joint pain (knees)).     Marland Kitchen glucose blood (ONETOUCH VERIO) test strip 1 each by Other route daily. DX E11.51 - ONE TOUCH VERIO 100 each 12  . HYDROcodone-acetaminophen (NORCO/VICODIN) 5-325 MG tablet Take 1 tablet by mouth every 6 (six) hours as needed. 6 tablet 0  . metFORMIN (GLUCOPHAGE) 1000 MG tablet TAKE 1 TABLET (1,000 MG TOTAL) BY MOUTH 2 (TWO) TIMES DAILY WITH A MEAL. 60 tablet 2  . Multiple Vitamin (MULTIVITAMIN WITH MINERALS) TABS Take 1 tablet by mouth daily.    . nitroGLYCERIN (NITROSTAT) 0.4 MG SL tablet DISSOLVE 1 TABLET BY MOUTH UNDER THE TONGUE EVERY 5 MINUTES AS NEEDED FOR CHEST PAIN (Patient taking differently: Place 0.4 mg under the tongue every 5 (five) minutes as needed for chest pain. ) 75 tablet 5  . OneTouch Delica Lancets 22L MISC 1 each by Does not apply route daily. 100 each 4  . tadalafil (CIALIS) 20 MG tablet Take 1 tablet (20 mg total) by mouth daily as needed for erectile dysfunction. 20 tablet 5  . vitamin C (ASCORBIC ACID) 500 MG tablet Take 500 mg by mouth daily. Reported on 08/23/2015     Current Facility-Administered Medications on File Prior to Visit  Medication Dose Route Frequency Provider Last Rate Last Admin  . 0.9 %  sodium chloride infusion  500 mL Intravenous Once Milus Banister, MD         No Known Allergies   Assessment/Plan:  1. Hypertension - BP at goal of <130/80 mmHg. Patient brought home BP machine for calibration and measured 125/79 and HR 74 (left arm) which was at goal and consistent with clinic BP reading 124/70. Additionally, HR improved (90s-110s) since increasing bisoprolol to 10 mg daily. Will continue benazepril 40 mg daily, bisoprolol 10 mg daily, and amlodipine 5 mg daily. Encouraged patient to continue checking BP at least 30 minutes to 1 hour after taking BP medications  and to continue eating a healthy diet and attend physical therapy. Instructed patient to contact HTN clinic if BP/HR consistently increases above goal. Patient verbalized understanding.  Lorel Monaco, PharmD PGY2 [facility] [address], [place], [place] [address] Phone: [phone]; Fax: [phone]

## 2019-11-10 ENCOUNTER — Ambulatory Visit (INDEPENDENT_AMBULATORY_CARE_PROVIDER_SITE_OTHER): Payer: 59 | Admitting: Pharmacist

## 2019-11-10 ENCOUNTER — Encounter: Payer: Self-pay | Admitting: Pharmacist

## 2019-11-10 ENCOUNTER — Other Ambulatory Visit: Payer: Self-pay

## 2019-11-10 VITALS — BP 124/70 | HR 71

## 2019-11-10 DIAGNOSIS — I10 Essential (primary) hypertension: Secondary | ICD-10-CM | POA: Diagnosis not present

## 2019-11-10 NOTE — Patient Instructions (Signed)
It was nice to see you today!  Your goal blood pressure is < 130/80 mmHg. In clinic, your blood pressure was 124/70 mmHg.  Medication Changes:   Continue taking Benazepril 40 mg daily (AM)  Continue taking Bisoprolol 10 mg daily (AM)  Continue taking Amlodipine 5 mg daily (AM)  Monitor blood pressure at home daily and keep a log (on your phone or piece of paper). Write down date, time, blood pressure and pulse.  Keep up the good work with diet and exercise. Aim for a diet full of vegetables, fruit and lean meats (chicken, [place], fish). Try to limit salt intake by eating fresh or frozen vegetables (instead of canned), rinse canned vegetables prior to cooking and do not add any additional salt to meals.   Please give [place] a call at [phone] with any questions or concerns.

## 2019-11-27 ENCOUNTER — Other Ambulatory Visit: Payer: Self-pay | Admitting: Internal Medicine

## 2020-01-03 ENCOUNTER — Other Ambulatory Visit: Payer: Self-pay | Admitting: Internal Medicine

## 2020-02-06 ENCOUNTER — Other Ambulatory Visit: Payer: Self-pay | Admitting: Gastroenterology

## 2020-03-13 ENCOUNTER — Other Ambulatory Visit: Payer: Self-pay | Admitting: Internal Medicine

## 2020-03-16 ENCOUNTER — Other Ambulatory Visit: Payer: Self-pay | Admitting: Internal Medicine

## 2020-03-30 ENCOUNTER — Telehealth: Payer: Self-pay | Admitting: *Deleted

## 2020-03-30 NOTE — Telephone Encounter (Signed)
Prior Auth started for  tadalafil (CIALIS) 20 MG tablet(Expired) Key [id number]  Denied - patient should pay out of pocket 100%.

## 2020-03-31 ENCOUNTER — Other Ambulatory Visit: Payer: Self-pay | Admitting: Internal Medicine

## 2020-04-04 ENCOUNTER — Other Ambulatory Visit: Payer: Self-pay | Admitting: Internal Medicine

## 2020-04-12 ENCOUNTER — Other Ambulatory Visit: Payer: Self-pay | Admitting: Internal Medicine

## 2020-04-15 ENCOUNTER — Other Ambulatory Visit: Payer: Self-pay | Admitting: Cardiovascular Disease

## 2020-06-05 ENCOUNTER — Other Ambulatory Visit: Payer: Self-pay | Admitting: Internal Medicine

## 2020-06-09 ENCOUNTER — Other Ambulatory Visit: Payer: Self-pay | Admitting: Internal Medicine

## 2020-06-09 NOTE — Telephone Encounter (Signed)
06/23/19 last office visit.

## 2020-06-12 NOTE — Progress Notes (Signed)
Cardiology Office Note:    Date:  06/13/2020   ID:  TYMIER LINDHOLM, DOB 01/28/1951, MRN [id number]  PCP:  [name], [name], [place]  Cardiologist:  [name], MD Electrophysiologist:  None       Referring MD: [name], [name]   Chief Complaint:  Follow-up (CAD)    Patient Profile:     Jose [name] is a 70 y.o. male with:   Coronary artery disease   S/p DES to LCx in 07/2008  Cath 10/2013: patent LCx stent   Hypertension   Hyperlipidemia   GERD   Bifascicular block (RBBB, LAFB)  Hx of TIA  Hx of seizures   Prior CV studies:  - LHC (8/15):  prox LAD 30%, OM1 50% at bifurcation, OM1 sub-branch 50%, mid AVCFX stent patent, EF 55-65% - no change from 2010 >>> Med Rx  - Nuclear (7/15):  Partially fixed inf defect with some reversibility, EF 51%; Mod Risk  - Echo (09/2005):  EF 55-60%, no RWMA    History of Present Illness:    Mr. Choi was last seen by Dr. Burt Knack in 4/21.  He returns for f/u.  He is here alone.  He denies chest pain, shortness of breath, syncope, orthopnea.  He has some dependent pedal edema.          Past Medical History:  Diagnosis Date  . ALCOHOL ABUSE 07/27/2008  . Allergy   . ANXIETY 07/27/2008  . Arthritis   . Bifascicular block    beta blocker stopped due to profound bradycardia  . CAD 07/27/2008   a. s/p Endeavor DES to CFX 07/2008; residual OM1 70%, EF 45%;  b. relook cath 5/10: patent stent in CFX;  c.  LHC (8/15):  prox LAD 30%, OM1 50% at bifurcation, OM1 sub-branch 50%, mid AVCFX stent patent, EF 55-65% - no change from 2010 >>> Med Rx (after abnormal nuc)   . Cataract   . Diabetes mellitus without complication ([place])   . GERD 09/18/2006  . History of TIA (transient ischemic attack) may 2010  . Hx of adenomatous colonic polyps   . Hx of cardiovascular stress test    ETT-Myoview (09/2013):  Partially fixed defect with some reversibility - cannot r/o inf ischemia vs diaph atten, EF 51%;  mod risk  . Hyperlipidemia   . HYPERTENSION 09/18/2006  . LOW BACK PAIN 09/18/2006  . Myocardial infarction ([place]) 2010  . Numbness and tingling    finger tips at times  . Rosacea 07/27/2008  . SEIZURE DISORDER 09/18/2006   none since 2008  . 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  . Urinary tract infection start cipro 01-07-2012    Current Medications: Current Meds  Medication Sig  . ALPRAZolam (XANAX) 0.5 MG tablet TAKE 1 TABLET BY MOUTH THREE TIMES A DAY AS NEEDED  . amLODipine (NORVASC) 5 MG tablet TAKE 1 TABLET BY MOUTH EVERY DAY  . atorvastatin (LIPITOR) 20 MG tablet TAKE 1 TABLET BY MOUTH EVERY DAY  . Azelaic Acid 15 % cream APPLY 1 APPLICATION TOPICALLY AS DIRECTED. AFTER SKIN IS THOROUGHLY WASHED AND PATTED DRY, GENTLY BUT THOROUGHLY MASSAGE A THIN FILM OF AZELAIC ACID INTO THE AFFECTED AREA TWICE DAILY, IN THE MORNING AND EVENING. APPLIED TO FACE.  Marland Kitchen BAYER LOW DOSE 81 MG chewable tablet Chew 81 mg by mouth daily.  . benazepril (LOTENSIN) 40 MG tablet TAKE 1 TABLET BY MOUTH EVERY DAY  . bisoprolol (ZEBETA) 10 MG tablet Take 1 tablet (10  mg total) by mouth daily. Please make yearly appt with Dr. Burt Knack for April 2022 for future refills. Thank you 1st attempt  . cholecalciferol (VITAMIN D) 25 MCG (1000 UNIT) tablet Take 1,000 Units by mouth daily.  . diclofenac sodium (VOLTAREN) 1 % GEL Apply 2 g topically 2 (two) times daily as needed (joint pain (knees)).   Marland Kitchen glucose blood (ONETOUCH VERIO) test strip 1 each by Other route daily. DX E11.51 - ONE TOUCH VERIO  . metFORMIN (GLUCOPHAGE) 1000 MG tablet TAKE 1 TABLET (1,000 MG TOTAL) BY MOUTH 2 (TWO) TIMES DAILY WITH A MEAL.  . Multiple Vitamin (MULTIVITAMIN WITH MINERALS) TABS Take 1 tablet by mouth daily.  . nitroGLYCERIN (NITROSTAT) 0.4 MG SL tablet DISSOLVE 1 TABLET BY MOUTH UNDER THE TONGUE EVERY 5 MINUTES AS NEEDED FOR CHEST PAIN  . OneTouch Delica Lancets 35T MISC 1 each by Does not apply route daily.  . vitamin C (ASCORBIC ACID) 500 MG tablet  Take 500 mg by mouth daily. Reported on 08/23/2015   Current Facility-Administered Medications for the 06/13/20 encounter (Office Visit) with Richardson Dopp T, PA-C  Medication  . 0.9 %  sodium chloride infusion     Allergies:   Patient has no known allergies.   Social History   Tobacco Use  . Smoking status: Never Smoker  . Smokeless tobacco: Former Systems developer    Types: Chew  Substance Use Topics  . Alcohol use: No    Comment: past drinker 2-3 daily and sometimes binge drinks on the weekend  . Drug use: No     Family Hx: The patient's family history includes Diabetes in his mother; Emphysema in his father; Heart attack in an other family member; Heart failure in his father; Hyperlipidemia in his mother; Hypertension in his mother; Lung cancer in his mother. There is no history of Colon cancer, Stroke, Esophageal cancer, Liver cancer, Pancreatic cancer, Stomach cancer, or Rectal cancer.  ROS   EKGs/Labs/Other Test Reviewed:    EKG:  EKG is   ordered today.  The ekg ordered today demonstrates normal sinus rhythm, HR 65, LAD, RBBB, QTc 482 ms   Recent Labs: 06/23/2019: ALT 23; BUN 9; Creatinine, Ser 0.72; Hemoglobin 15.0; Platelets 164.0; Potassium 3.6; Sodium 140; TSH 3.46   Recent Lipid Panel Lab Results  Component Value Date/Time   CHOL 137 06/23/2019 07:42 AM   TRIG 131.0 06/23/2019 07:42 AM   HDL 54.00 06/23/2019 07:42 AM   CHOLHDL 3 06/23/2019 07:42 AM   LDLCALC 57 06/23/2019 07:42 AM   LDLDIRECT 163.3 07/09/2007 08:18 AM      Risk Assessment/Calculations:      Physical Exam:    VS:  BP (!) 160/64   Pulse 65   Ht 5\' 6"  (1.676 m)   Wt 183 lb 6.4 oz (83.2 kg)   SpO2 97%   BMI 29.60 kg/m     Wt Readings from Last 3 Encounters:  06/13/20 183 lb 6.4 oz (83.2 kg)  06/23/19 184 lb 1.6 oz (83.5 kg)  06/21/19 180 lb (81.6 kg)     Constitutional:      Appearance: Healthy appearance. Not in distress.  Neck:     Vascular: No carotid bruit or JVR. JVD normal.   Pulmonary:     Effort: Pulmonary effort is normal.     Breath sounds: No wheezing. No rales.  Cardiovascular:     Normal rate. Regular rhythm. Normal S1. Normal S2.     Murmurs: There is no murmur.  Edema:  Pretibial: bilateral trace edema of the pretibial area.    Ankle: bilateral 1+ edema of the ankle. Abdominal:     Palpations: Abdomen is soft. There is no hepatomegaly.  Skin:    General: Skin is warm and dry.  Neurological:     General: No focal deficit present.     Mental Status: Alert and oriented to person, place and time.     Cranial Nerves: Cranial nerves are intact.       ASSESSMENT & PLAN:    1. Coronary artery disease involving native coronary artery of native heart without angina pectoris Hx of DES to LCx in 2010.  Stent patent at cath in 8/15.  He is doing well w/o angina.  Continue ASA, atorvastatin, bisoprolol.   2. Essential hypertension BP elevated here.  He had bacon this AM.  He brings in a list of BPs from home.  The vast majority of his readings at home are optimal.  Continue current medical regimen including amlodipine, benazepril, bisoprolol.    3. Mixed hyperlipidemia Continue statin Rx.  He has f/u fasting lipids with his PCP next month.  Goal LDL < 70.     Dispo:  Return in about 1 year (around 06/13/2021) for Routine 1 year follow up with Dr. Burt Knack..   Medication Adjustments/Labs and Tests Ordered: Current medicines are reviewed at length with the patient today.  Concerns regarding medicines are outlined above.  Tests Ordered: Orders Placed This Encounter  Procedures  . EKG 12-Lead   Medication Changes: No orders of the defined types were placed in this encounter.   Signed, Richardson Dopp, PA-C  06/13/2020 1:00 PM    [facility] [place], [place], [place]  [address] Phone: [phone]; Fax: [phone]

## 2020-06-13 ENCOUNTER — Other Ambulatory Visit: Payer: Self-pay

## 2020-06-13 ENCOUNTER — Encounter: Payer: Self-pay | Admitting: Physician Assistant

## 2020-06-13 ENCOUNTER — Ambulatory Visit: Payer: Managed Care, Other (non HMO) | Admitting: Physician Assistant

## 2020-06-13 VITALS — BP 160/64 | HR 65 | Ht 66.0 in | Wt 183.4 lb

## 2020-06-13 DIAGNOSIS — E782 Mixed hyperlipidemia: Secondary | ICD-10-CM | POA: Diagnosis not present

## 2020-06-13 DIAGNOSIS — I2510 Atherosclerotic heart disease of native coronary artery without angina pectoris: Secondary | ICD-10-CM

## 2020-06-13 DIAGNOSIS — I10 Essential (primary) hypertension: Secondary | ICD-10-CM | POA: Diagnosis not present

## 2020-06-13 NOTE — Patient Instructions (Signed)
Medication Instructions:  Your physician recommends that you continue on your current medications as directed. Please refer to the Current Medication list given to you today.  *If you need a refill on your cardiac medications before your next appointment, please call your pharmacy*   Lab Work: -None  If you have labs (blood work) drawn today and your tests are completely normal, you will receive your results only by: Marland Kitchen MyChart Message (if you have MyChart) OR . A paper copy in the mail If you have any lab test that is abnormal or we need to change your treatment, we will call you to review the results.   Testing/Procedures: -None   Follow-Up: At [facility], you and your health needs are our priority.  As part of our continuing mission to provide you with exceptional heart care, we have created designated Provider Care Teams.  These Care Teams include your primary Cardiologist (physician) and Advanced Practice Providers (APPs -  Physician Assistants and Nurse Practitioners) who all work together to provide you with the care you need, when you need it.  We recommend signing up for the patient portal called "MyChart".  Sign up information is provided on this After Visit Summary.  MyChart is used to connect with patients for Virtual Visits (Telemedicine).  Patients are able to view lab/test results, encounter notes, upcoming appointments, etc.  Non-urgent messages can be sent to your provider as well.   To learn more about what you can do with MyChart, go to [email or web address].    Your next appointment:   1 year(s)  The format for your next appointment:   In Person  Provider:   [name], MD   Other Instructions Your physician wants you to follow-up in: 1 year with Dr. Burt Knack.  You will receive a reminder letter in the mail two months in advance. If you don't receive a letter, please call our office to schedule the follow-up appointment.

## 2020-06-25 ENCOUNTER — Other Ambulatory Visit: Payer: Self-pay | Admitting: Internal Medicine

## 2020-06-27 ENCOUNTER — Other Ambulatory Visit: Payer: Self-pay | Admitting: Internal Medicine

## 2020-06-30 ENCOUNTER — Other Ambulatory Visit: Payer: Self-pay | Admitting: Internal Medicine

## 2020-07-02 ENCOUNTER — Other Ambulatory Visit: Payer: Self-pay | Admitting: Internal Medicine

## 2020-07-04 ENCOUNTER — Other Ambulatory Visit: Payer: Self-pay

## 2020-07-05 ENCOUNTER — Ambulatory Visit (INDEPENDENT_AMBULATORY_CARE_PROVIDER_SITE_OTHER): Payer: Managed Care, Other (non HMO) | Admitting: Internal Medicine

## 2020-07-05 ENCOUNTER — Encounter: Payer: Self-pay | Admitting: Internal Medicine

## 2020-07-05 VITALS — BP 120/70 | HR 67 | Temp 97.9°F | Ht 67.0 in | Wt 184.3 lb

## 2020-07-05 DIAGNOSIS — Z23 Encounter for immunization: Secondary | ICD-10-CM | POA: Diagnosis not present

## 2020-07-05 DIAGNOSIS — I10 Essential (primary) hypertension: Secondary | ICD-10-CM

## 2020-07-05 DIAGNOSIS — E1151 Type 2 diabetes mellitus with diabetic peripheral angiopathy without gangrene: Secondary | ICD-10-CM

## 2020-07-05 DIAGNOSIS — Z86010 Personal history of colonic polyps: Secondary | ICD-10-CM

## 2020-07-05 DIAGNOSIS — R972 Elevated prostate specific antigen [PSA]: Secondary | ICD-10-CM

## 2020-07-05 DIAGNOSIS — Z0000 Encounter for general adult medical examination without abnormal findings: Secondary | ICD-10-CM | POA: Diagnosis not present

## 2020-07-05 DIAGNOSIS — E785 Hyperlipidemia, unspecified: Secondary | ICD-10-CM | POA: Diagnosis not present

## 2020-07-05 DIAGNOSIS — K219 Gastro-esophageal reflux disease without esophagitis: Secondary | ICD-10-CM

## 2020-07-05 DIAGNOSIS — I2510 Atherosclerotic heart disease of native coronary artery without angina pectoris: Secondary | ICD-10-CM

## 2020-07-05 LAB — COMPREHENSIVE METABOLIC PANEL
ALT: 36 U/L (ref 0–53)
AST: 45 U/L — ABNORMAL HIGH (ref 0–37)
Albumin: 4 g/dL (ref 3.5–5.2)
Alkaline Phosphatase: 58 U/L (ref 39–117)
BUN: 10 mg/dL (ref 6–23)
CO2: 31 mEq/L (ref 19–32)
Calcium: 9.8 mg/dL (ref 8.4–10.5)
Chloride: 100 mEq/L (ref 96–112)
Creatinine, Ser: 0.86 mg/dL (ref 0.40–1.50)
GFR: 89.41 mL/min (ref >60.00)
Glucose, Bld: 89 mg/dL (ref 70–99)
Potassium: 4.7 mEq/L (ref 3.5–5.1)
Sodium: 139 mEq/L (ref 135–145)
Total Bilirubin: 0.8 mg/dL (ref 0.2–1.2)
Total Protein: 6.2 g/dL (ref 6.0–8.3)

## 2020-07-05 LAB — CBC WITH DIFFERENTIAL/PLATELET
Basophils Absolute: 0 10*3/uL (ref 0.0–0.1)
Basophils Relative: 0.6 % (ref 0.0–3.0)
Eosinophils Absolute: 0.3 10*3/uL (ref 0.0–0.7)
Eosinophils Relative: 5.6 % — ABNORMAL HIGH (ref 0.0–5.0)
HCT: 40.9 % (ref 39.0–52.0)
Hemoglobin: 13.8 g/dL (ref 13.0–17.0)
Lymphocytes Relative: 30.7 % (ref 12.0–46.0)
Lymphs Abs: 1.7 10*3/uL (ref 0.7–4.0)
MCHC: 33.8 g/dL (ref 30.0–36.0)
MCV: 99.8 fl (ref 78.0–100.0)
Monocytes Absolute: 0.6 10*3/uL (ref 0.1–1.0)
Monocytes Relative: 11.3 % (ref 3.0–12.0)
Neutro Abs: 2.8 10*3/uL (ref 1.4–7.7)
Neutrophils Relative %: 51.8 % (ref 43.0–77.0)
Platelets: 168 10*3/uL (ref 150.0–400.0)
RBC: 4.1 Mil/uL — ABNORMAL LOW (ref 4.22–5.81)
RDW: 14 % (ref 11.5–15.5)
WBC: 5.4 10*3/uL (ref 4.0–10.5)

## 2020-07-05 LAB — LIPID PANEL
Cholesterol: 141 mg/dL (ref 0–200)
HDL: 56.6 mg/dL (ref >39.00)
LDL Cholesterol: 68 mg/dL (ref 0–99)
NonHDL: 84.68
Total CHOL/HDL Ratio: 2
Triglycerides: 83 mg/dL (ref 0.0–149.0)
VLDL: 16.6 mg/dL (ref 0.0–40.0)

## 2020-07-05 LAB — TSH: TSH: 3.09 u[IU]/mL (ref 0.35–4.50)

## 2020-07-05 LAB — VITAMIN D 25 HYDROXY (VIT D DEFICIENCY, FRACTURES): VITD: 52.42 ng/mL (ref 30.00–100.00)

## 2020-07-05 LAB — PSA: PSA: 6.42 ng/mL — ABNORMAL HIGH (ref 0.10–4.00)

## 2020-07-05 LAB — VITAMIN B12: Vitamin B-12: 373 pg/mL (ref 211–911)

## 2020-07-05 LAB — HEMOGLOBIN A1C: Hgb A1c MFr Bld: 5.7 % (ref 4.6–6.5)

## 2020-07-05 NOTE — Progress Notes (Signed)
Established Patient Office Visit     This visit occurred during the SARS-CoV-2 public health emergency.  Safety protocols were in place, including screening questions prior to the visit, additional usage of staff PPE, and extensive cleaning of exam room while observing appropriate contact time as indicated for disinfecting solutions.    CC/Reason for Visit: Annual preventive exam and subsequent Medicare wellness visit  HPI: Jose [name] is a 70 y.o. male who is coming in today for the above mentioned reasons. Past Medical History is significant for: Hypertension that has been well controlled on bisoprolol and benazepril,type 2 diabetes well controlled on metformin,hyperlipidemia on atorvastatin 20 mg, he does have some mild anxiety for which he takes alprazolam pretty much on a daily basis once a day in the morning, has a history of GERD but has been off medication for a few months and has not had anysymptoms, also has a history of colon polyps and is on a q. 5-year colonoscopy schedule, last done in July 2019. 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  He has had a slow recovery from this.  He has routine eye and dental care.  He has noticed some decreased hearing.  He does not exercise routinely.  He is overdue for PPSV23.  He has not yet had his shingles vaccination, he has had 4 COVID vaccines.  He was noted to have an elevated PSA at his last physical, however he did not follow-up with urology.  He continues to have some hesitancy, frequency and nocturia.  He is having issues with erectile dysfunction.   Past Medical/Surgical History: Past Medical History:  Diagnosis Date  . ALCOHOL ABUSE 07/27/2008  . Allergy   . ANXIETY 07/27/2008  . Arthritis   . Bifascicular block    beta blocker stopped due to profound bradycardia  . CAD 07/27/2008   a. s/p Endeavor DES to CFX 07/2008; residual OM1 70%, EF 45%;  b. relook cath 5/10: patent stent in CFX;  c.  LHC (8/15):  prox LAD  30%, OM1 50% at bifurcation, OM1 sub-branch 50%, mid AVCFX stent patent, EF 55-65% - no change from 2010 >>> Med Rx (after abnormal nuc)   . Cataract   . Diabetes mellitus without complication ([place])   . GERD 09/18/2006  . History of TIA (transient ischemic attack) may 2010  . Hx of adenomatous colonic polyps   . Hx of cardiovascular stress test    ETT-Myoview (09/2013):  Partially fixed defect with some reversibility - cannot r/o inf ischemia vs diaph atten, EF 51%; mod risk  . Hyperlipidemia   . HYPERTENSION 09/18/2006  . LOW BACK PAIN 09/18/2006  . Myocardial infarction ([place]) 2010  . Numbness and tingling    finger tips at times  . Rosacea 07/27/2008  . SEIZURE DISORDER 09/18/2006   none since 2008  . 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  . Urinary tract infection start cipro 01-07-2012    Past Surgical History:  Procedure Laterality Date  . CORONARY STENT PLACEMENT     drug eluting  . HERNIA REPAIR    . INGUINAL HERNIA REPAIR  01/14/2012   Procedure: LAPAROSCOPIC INGUINAL HERNIA;  Surgeon: Ralene Ok, MD;  Location: [facility];  Service: General;  Laterality: Right;  . INSERTION OF MESH  01/14/2012   Procedure: INSERTION OF MESH;  Surgeon: Ralene Ok, MD;  Location: [facility];  Service: General;  Laterality: Right;  . KNEE SURGERY    . LEFT HEART CATHETERIZATION WITH CORONARY ANGIOGRAM N/A  10/21/2013   Procedure: LEFT HEART CATHETERIZATION WITH CORONARY ANGIOGRAM;  Surgeon: Blane Ohara, MD;  Location: [facility] CATH LAB;  Service: Cardiovascular;  Laterality: N/A;  . MEDIAL PARTIAL KNEE REPLACEMENT Left     Social History:  reports that he has never smoked. He quit smokeless tobacco use about 8 years ago.  His smokeless tobacco use included chew. He reports that he does not drink alcohol and does not use drugs.  Allergies: No Known Allergies  Family History:  Family History  Problem Relation Age of Onset  . Lung cancer Mother   . Hypertension Mother   . Diabetes Mother   .  Hyperlipidemia Mother   . Emphysema Father   . Heart failure Father   . Heart attack Other   . Colon cancer Neg Hx   . Stroke Neg Hx   . Esophageal cancer Neg Hx   . Liver cancer Neg Hx   . Pancreatic cancer Neg Hx   . Stomach cancer Neg Hx   . Rectal cancer Neg Hx      Current Outpatient Medications:  .  ALPRAZolam (XANAX) 0.5 MG tablet, TAKE 1 TABLET BY MOUTH THREE TIMES A DAY AS NEEDED, Disp: 60 tablet, Rfl: 0 .  amLODipine (NORVASC) 5 MG tablet, TAKE 1 TABLET BY MOUTH EVERY DAY, Disp: 90 tablet, Rfl: 1 .  atorvastatin (LIPITOR) 20 MG tablet, TAKE 1 TABLET BY MOUTH EVERY DAY, Disp: 90 tablet, Rfl: 0 .  Azelaic Acid 15 % cream, APPLY 1 APPLICATION TOPICALLY AS DIRECTED. AFTER SKIN IS THOROUGHLY WASHED AND PATTED DRY, GENTLY BUT THOROUGHLY MASSAGE A THIN FILM OF AZELAIC ACID INTO THE AFFECTED AREA TWICE DAILY, IN THE MORNING AND EVENING. APPLIED TO FACE., Disp: 50 g, Rfl: 0 .  BAYER LOW DOSE 81 MG chewable tablet, Chew 81 mg by mouth daily., Disp: , Rfl:  .  benazepril (LOTENSIN) 40 MG tablet, TAKE 1 TABLET BY MOUTH EVERY DAY, Disp: 90 tablet, Rfl: 0 .  bisoprolol (ZEBETA) 10 MG tablet, Take 1 tablet (10 mg total) by mouth daily. Please make yearly appt with Dr. Burt Knack for April 2022 for future refills. Thank you 1st attempt, Disp: 30 tablet, Rfl: 2 .  cholecalciferol (VITAMIN D) 25 MCG (1000 UNIT) tablet, Take 1,000 Units by mouth daily., Disp: , Rfl:  .  diclofenac sodium (VOLTAREN) 1 % GEL, Apply 2 g topically 2 (two) times daily as needed (joint pain (knees)). , Disp: , Rfl:  .  glucose blood (ONETOUCH VERIO) test strip, 1 each by Other route daily. DX E11.51 - ONE TOUCH VERIO, Disp: 100 each, Rfl: 12 .  halobetasol (ULTRAVATE) 0.05 % cream, Apply topically 2 (two) times daily., Disp: , Rfl:  .  Lancets (ONETOUCH DELICA PLUS WVPXTG62I) MISC, TEST WITH 1 LANCET PER USE, USE AS DIRECTED (MAX 30DAYS), Disp: 100 each, Rfl: 4 .  metFORMIN (GLUCOPHAGE) 1000 MG tablet, TAKE 1 TABLET (1,000  MG TOTAL) BY MOUTH 2 (TWO) TIMES DAILY WITH A MEAL., Disp: 60 tablet, Rfl: 0 .  Multiple Vitamin (MULTIVITAMIN WITH MINERALS) TABS, Take 1 tablet by mouth daily., Disp: , Rfl:  .  nitroGLYCERIN (NITROSTAT) 0.4 MG SL tablet, DISSOLVE 1 TABLET BY MOUTH UNDER THE TONGUE EVERY 5 MINUTES AS NEEDED FOR CHEST PAIN, Disp: 75 tablet, Rfl: 5 .  vitamin C (ASCORBIC ACID) 500 MG tablet, Take 500 mg by mouth daily. Reported on 08/23/2015, Disp: , Rfl:   Current Facility-Administered Medications:  .  0.9 %  sodium chloride infusion, 500 mL, Intravenous,  Once, Rachael FeeJacobs, Daniel P, MD  Review of Systems:  Constitutional: Denies fever, chills, diaphoresis, appetite change and fatigue.  HEENT: Denies photophobia, eye pain, redness, hearing loss, ear pain, congestion, sore throat, rhinorrhea, sneezing, mouth sores, trouble swallowing, neck pain, neck stiffness and tinnitus.   Respiratory: Denies SOB, DOE, cough, chest tightness,  and wheezing.   Cardiovascular: Denies chest pain, palpitations and leg swelling.  Gastrointestinal: Denies nausea, vomiting, abdominal pain, diarrhea, constipation, blood in stool and abdominal distention.  Genitourinary: Denies dysuria, urgency, frequency, hematuria, flank pain and difficulty urinating.  Endocrine: Denies: hot or cold intolerance, sweats, changes in hair or nails, polyuria, polydipsia. Musculoskeletal: Denies myalgias, back pain, joint swelling, arthralgias and gait problem.  Skin: Denies pallor, rash and wound.  Neurological: Denies dizziness, seizures, syncope, weakness, light-headedness, numbness and headaches.  Hematological: Denies adenopathy. Easy bruising, personal or family bleeding history  Psychiatric/Behavioral: Denies suicidal ideation, mood changes, confusion, nervousness, sleep disturbance and agitation    Physical Exam: Vitals:   07/05/20 0846  BP: 120/70  Pulse: 67  Temp: 97.9 F (36.6 C)  TempSrc: Oral  Weight: 184 lb 4.8 oz (83.6 kg)  Height:  5\' 7"  (1.702 m)    Body mass index is 28.87 kg/m.   Constitutional: NAD, calm, comfortable Eyes: PERRL, lids and conjunctivae normal ENMT: Mucous membranes are moist. Posterior pharynx clear of any exudate or lesions. Normal dentition. Tympanic membrane is pearly white, no erythema or bulging. Neck: normal, supple, no masses, no thyromegaly Respiratory: clear to auscultation bilaterally, no wheezing, no crackles. Normal respiratory effort. No accessory muscle use.  Cardiovascular: Regular rate and rhythm, no murmurs / rubs / gallops. No extremity edema. 2+ pedal pulses. No carotid bruits.  Abdomen: no tenderness, no masses palpated. No hepatosplenomegaly. Bowel sounds positive.  Musculoskeletal: no clubbing / cyanosis. No joint deformity upper and lower extremities. Good ROM, no contractures. Normal muscle tone.  Skin: no rashes, lesions, ulcers. No induration Neurologic: CN 2-12 grossly intact. Sensation intact, DTR normal. Strength 5/5 in all 4.  Psychiatric: Normal judgment and insight. Alert and oriented x 3. Normal mood.    Subsequent Medicare wellness visit   1. Risk factors, based on past  M,S,F -cardiovascular disease risk factors include age, gender, history of diabetes, hyperlipidemia, hypertension.   2.  Physical activities: He does not have dedicated time for exercise   3.  Depression/mood:  Stable, not depressed   4.  Hearing:  Minor issues, declines audiology referral today   5.  ADL's: Independent in all ADLs   6.  Fall risk:  Low fall risk   7.  Home safety: No problems identified   8.  Height weight, and visual acuity: height and weight as above, vision:   Visual Acuity Screening   Right eye Left eye Both eyes  Without correction: 20/20 20/25 20/20   With correction:        9.  Counseling:  Advised he update his vaccination status, follow-up with urology   10. Lab orders based on risk factors: Laboratory update will be reviewed   11. Referral :   Urology   12. Care plan:  Follow-up with me in 3 to 6 months   13. Cognitive assessment:  No cognitive impairment   14. Screening: Patient provided with a written and personalized 5-10 year screening schedule in the AVS.   yes   15. Provider List Update:   PCP, cardiology  16. Advance Directives: Full code   17. Opioids: Patient is not on any opioid prescriptions  and has no risk factors for a substance use disorder.   [place] Office Visit from 07/05/2020 in [place] [facility] [place]  PHQ-9 Total Score 0      Fall Risk  07/05/2020 06/23/2019 06/03/2017  Falls in the past year? 1 0 Yes  Number falls in past yr: 0 0 1  Injury with Fall? 0 0 Yes  Comment - -  Bruised Right knee     Impression and Plan:  Encounter for preventive health examination  -He has routine eye and dental care. -He will get a PPSV23 today to finalize his pneumonia vaccinations, he has completed his fourth COVID booster, he will schedule his shingles vaccination series at his pharmacy. -Screening labs today. -Healthy lifestyle discussed. -His colonoscopy was in 2019, he is a 5-year callback. -Due to prior elevated PSA, recheck PSA today and refer to urology.  Diabetes mellitus with peripheral vascular disease ([place]) -He has not had an A1c in over a year.  At last check it was well controlled at 5.5, check A1c today.  Elevated PSA  - Plan: PSA, Ambulatory referral to Urology  Gastroesophageal reflux disease without esophagitis -Well-controlled, not on daily PPI therapy.  Essential hypertension  - Plan: CBC with Differential/Platelet, Comprehensive metabolic panel -Well-controlled today.  Dyslipidemia  - Plan: Lipid panel -Last LDL was at goal at 57 in April 2021.  Personal history of colonic polyps -He had a colonoscopy in 2019 and is a 5-year callback.  Atherosclerosis of native coronary artery of native heart without angina pectoris -Stable, no chest pain, followed by  cardiology  Need for vaccination against Streptococcus pneumoniae -PPSV23 administered today.   Patient Instructions   -Nice seeing you today!!  -Lab work today; will notify you once results are available.  -Pneumonia vaccine today.  -Remember: 4th COVID booster, shingles vaccine at pharmacy.  -Referral to urology.  -Schedule follow up in 6 months.   Preventive Care 66 Years and Older, Male Preventive care refers to lifestyle choices and visits with your health care provider that can promote health and wellness. This includes:  A yearly physical exam. This is also called an annual wellness visit.  Regular dental and eye exams.  Immunizations.  Screening for certain conditions.  Healthy lifestyle choices, such as: ? Eating a healthy diet. ? Getting regular exercise. ? Not using drugs or products that contain nicotine and tobacco. ? Limiting alcohol use. What can I expect for my preventive care visit? Physical exam Your health care provider will check your:  Height and weight. These may be used to calculate your BMI (body mass index). BMI is a measurement that tells if you are at a healthy weight.  Heart rate and blood pressure.  Body temperature.  Skin for abnormal spots. Counseling Your health care provider may ask you questions about your:  Past medical problems.  Family's medical history.  Alcohol, tobacco, and drug use.  Emotional well-being.  Home life and relationship well-being.  Sexual activity.  Diet, exercise, and sleep habits.  History of falls.  Memory and ability to understand (cognition).  Work and work Statistician.  Access to firearms. What immunizations do I need? Vaccines are usually given at various ages, according to a schedule. Your health care provider will recommend vaccines for you based on your age, medical history, and lifestyle or other factors, such as travel or where you work.   What tests do I need? Blood  tests  Lipid and cholesterol levels. These may be checked every 5 years,  or more often depending on your overall health.  Hepatitis C test.  Hepatitis B test. Screening  Lung cancer screening. You may have this screening every year starting at age 65 if you have a 30-pack-year history of smoking and currently smoke or have quit within the past 15 years.  Colorectal cancer screening. ? All adults should have this screening starting at age 78 and continuing until age 67. ? Your health care provider may recommend screening at age 41 if you are at increased risk. ? You will have tests every 1-10 years, depending on your results and the type of screening test.  Prostate cancer screening. Recommendations will vary depending on your family history and other risks.  Genital exam to check for testicular cancer or hernias.  Diabetes screening. ? This is done by checking your blood sugar (glucose) after you have not eaten for a while (fasting). ? You may have this done every 1-3 years.  Abdominal aortic aneurysm (AAA) screening. You may need this if you are a current or former smoker.  STD (sexually transmitted disease) testing, if you are at risk. Follow these instructions at home: Eating and drinking  Eat a diet that includes fresh fruits and vegetables, whole grains, lean protein, and low-fat dairy products. Limit your intake of foods with high amounts of sugar, saturated fats, and salt.  Take vitamin and mineral supplements as recommended by your health care provider.  Do not drink alcohol if your health care provider tells you not to drink.  If you drink alcohol: ? Limit how much you have to 0-2 drinks a day. ? Be aware of how much alcohol is in your drink. In the [place] one drink equals one 12 oz bottle of beer (355 mL), one 5 oz glass of wine (148 mL), or one 1 oz glass of hard liquor (44 mL).   Lifestyle  Take daily care of your teeth and gums. Brush your teeth every morning and  night with fluoride toothpaste. Floss one time each day.  Stay active. Exercise for at least 30 minutes 5 or more days each week.  Do not use any products that contain nicotine or tobacco, such as cigarettes, e-cigarettes, and chewing tobacco. If you need help quitting, ask your health care provider.  Do not use drugs.  If you are sexually active, practice safe sex. Use a condom or other form of protection to prevent STIs (sexually transmitted infections).  Talk with your health care provider about taking a low-dose aspirin or statin.  Find healthy ways to cope with stress, such as: ? Meditation, yoga, or listening to music. ? Journaling. ? Talking to a trusted person. ? Spending time with friends and family. Safety  Always wear your seat belt while driving or riding in a vehicle.  Do not drive: ? If you have been drinking alcohol. Do not ride with someone who has been drinking. ? When you are tired or distracted. ? While texting.  Wear a helmet and other protective equipment during sports activities.  If you have firearms in your house, make sure you follow all gun safety procedures. What's next?  Visit your health care provider once a year for an annual wellness visit.  Ask your health care provider how often you should have your eyes and teeth checked.  Stay up to date on all vaccines. This information is not intended to replace advice given to you by your health care provider. Make sure you discuss any questions you have with your  health care provider. Document Revised: 11/18/2018 Document Reviewed: 02/13/2018 Elsevier Patient Education  2021 [place], MD [facility]

## 2020-07-05 NOTE — Patient Instructions (Signed)
-Nice seeing you today!!  -Lab work today; will notify you once results are available.  -Pneumonia vaccine today.  -Remember: 4th COVID booster, shingles vaccine at pharmacy.  -Referral to urology.  -Schedule follow up in 6 months.   Preventive Care 68 Years and Older, Male Preventive care refers to lifestyle choices and visits with your health care provider that can promote health and wellness. This includes:  A yearly physical exam. This is also called an annual wellness visit.  Regular dental and eye exams.  Immunizations.  Screening for certain conditions.  Healthy lifestyle choices, such as: ? Eating a healthy diet. ? Getting regular exercise. ? Not using drugs or products that contain nicotine and tobacco. ? Limiting alcohol use. What can I expect for my preventive care visit? Physical exam Your health care provider will check your:  Height and weight. These may be used to calculate your BMI (body mass index). BMI is a measurement that tells if you are at a healthy weight.  Heart rate and blood pressure.  Body temperature.  Skin for abnormal spots. Counseling Your health care provider may ask you questions about your:  Past medical problems.  Family's medical history.  Alcohol, tobacco, and drug use.  Emotional well-being.  Home life and relationship well-being.  Sexual activity.  Diet, exercise, and sleep habits.  History of falls.  Memory and ability to understand (cognition).  Work and work Statistician.  Access to firearms. What immunizations do I need? Vaccines are usually given at various ages, according to a schedule. Your health care provider will recommend vaccines for you based on your age, medical history, and lifestyle or other factors, such as travel or where you work.   What tests do I need? Blood tests  Lipid and cholesterol levels. These may be checked every 5 years, or more often depending on your overall health.  Hepatitis  C test.  Hepatitis B test. Screening  Lung cancer screening. You may have this screening every year starting at age 73 if you have a 30-pack-year history of smoking and currently smoke or have quit within the past 15 years.  Colorectal cancer screening. ? All adults should have this screening starting at age 50 and continuing until age 67. ? Your health care provider may recommend screening at age 63 if you are at increased risk. ? You will have tests every 1-10 years, depending on your results and the type of screening test.  Prostate cancer screening. Recommendations will vary depending on your family history and other risks.  Genital exam to check for testicular cancer or hernias.  Diabetes screening. ? This is done by checking your blood sugar (glucose) after you have not eaten for a while (fasting). ? You may have this done every 1-3 years.  Abdominal aortic aneurysm (AAA) screening. You may need this if you are a current or former smoker.  STD (sexually transmitted disease) testing, if you are at risk. Follow these instructions at home: Eating and drinking  Eat a diet that includes fresh fruits and vegetables, whole grains, lean protein, and low-fat dairy products. Limit your intake of foods with high amounts of sugar, saturated fats, and salt.  Take vitamin and mineral supplements as recommended by your health care provider.  Do not drink alcohol if your health care provider tells you not to drink.  If you drink alcohol: ? Limit how much you have to 0-2 drinks a day. ? Be aware of how much alcohol is in your drink. In  the [place] one drink equals one 12 oz bottle of beer (355 mL), one 5 oz glass of wine (148 mL), or one 1 oz glass of hard liquor (44 mL).   Lifestyle  Take daily care of your teeth and gums. Brush your teeth every morning and night with fluoride toothpaste. Floss one time each day.  Stay active. Exercise for at least 30 minutes 5 or more days each  week.  Do not use any products that contain nicotine or tobacco, such as cigarettes, e-cigarettes, and chewing tobacco. If you need help quitting, ask your health care provider.  Do not use drugs.  If you are sexually active, practice safe sex. Use a condom or other form of protection to prevent STIs (sexually transmitted infections).  Talk with your health care provider about taking a low-dose aspirin or statin.  Find healthy ways to cope with stress, such as: ? Meditation, yoga, or listening to music. ? Journaling. ? Talking to a trusted person. ? Spending time with friends and family. Safety  Always wear your seat belt while driving or riding in a vehicle.  Do not drive: ? If you have been drinking alcohol. Do not ride with someone who has been drinking. ? When you are tired or distracted. ? While texting.  Wear a helmet and other protective equipment during sports activities.  If you have firearms in your house, make sure you follow all gun safety procedures. What's next?  Visit your health care provider once a year for an annual wellness visit.  Ask your health care provider how often you should have your eyes and teeth checked.  Stay up to date on all vaccines. This information is not intended to replace advice given to you by your health care provider. Make sure you discuss any questions you have with your health care provider. Document Revised: 11/18/2018 Document Reviewed: 02/13/2018 Elsevier Patient Education  2021 [employer].

## 2020-07-05 NOTE — Addendum Note (Signed)
Addended by: Westley Hummer B on: 07/05/2020 04:57 PM   Modules accepted: Orders

## 2020-07-10 ENCOUNTER — Other Ambulatory Visit: Payer: Self-pay | Admitting: Cardiovascular Disease

## 2020-07-23 ENCOUNTER — Other Ambulatory Visit: Payer: Self-pay | Admitting: Internal Medicine

## 2020-08-08 ENCOUNTER — Other Ambulatory Visit: Payer: Self-pay | Admitting: Internal Medicine

## 2020-09-18 ENCOUNTER — Other Ambulatory Visit: Payer: Self-pay | Admitting: Internal Medicine

## 2020-09-22 ENCOUNTER — Other Ambulatory Visit: Payer: Self-pay | Admitting: Internal Medicine

## 2020-10-10 ENCOUNTER — Telehealth: Payer: Self-pay

## 2020-10-10 ENCOUNTER — Other Ambulatory Visit: Payer: Self-pay

## 2020-10-10 MED ORDER — AMLODIPINE BESYLATE 5 MG PO TABS: 5.0000 mg | ORAL_TABLET | Freq: Every day | ORAL | 1 refills | Status: DC

## 2020-10-10 NOTE — Telephone Encounter (Signed)
Patient called stating his Rx only dispensed 13 tablets.  A refill was sent to Pharmacy

## 2020-10-11 NOTE — Telephone Encounter (Signed)
Noted  

## 2020-11-10 ENCOUNTER — Other Ambulatory Visit: Payer: Self-pay | Admitting: Internal Medicine

## 2021-01-02 ENCOUNTER — Other Ambulatory Visit: Payer: Self-pay | Admitting: Internal Medicine

## 2021-01-05 ENCOUNTER — Other Ambulatory Visit: Payer: Self-pay | Admitting: Internal Medicine

## 2021-01-23 ENCOUNTER — Other Ambulatory Visit: Payer: Self-pay | Admitting: Internal Medicine

## 2021-01-26 ENCOUNTER — Other Ambulatory Visit: Payer: Self-pay | Admitting: Internal Medicine

## 2021-01-31 ENCOUNTER — Other Ambulatory Visit: Payer: Self-pay | Admitting: Internal Medicine

## 2021-02-01 IMAGING — CT CT ABD-PELV W/ CM
2 of 5 series · 16 of 46 positions shown, 18 images · IV contrast (omnipaque)
Comparison: None.

CLINICAL DATA: LEFT-sided abdominal pain starting [REDACTED].

EXAM:
CT ABDOMEN AND PELVIS WITH CONTRAST
TECHNIQUE: Multidetector CT imaging of the abdomen and pelvis was performed
using the standard protocol following bolus administration of
intravenous contrast.
CONTRAST:  100mL OMNIPAQUE IOHEXOL 300 MG/ML  SOLN

[Series 2: axial st · axial · 0.78mm/px · z∈[+1145,+1570]mm · 13 of 97 slices shown, 15 images]
[im 6/97  soft-tissue]
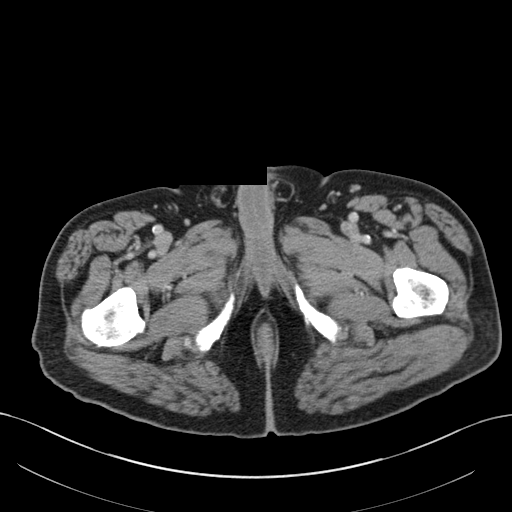
[im 6/97  bone]
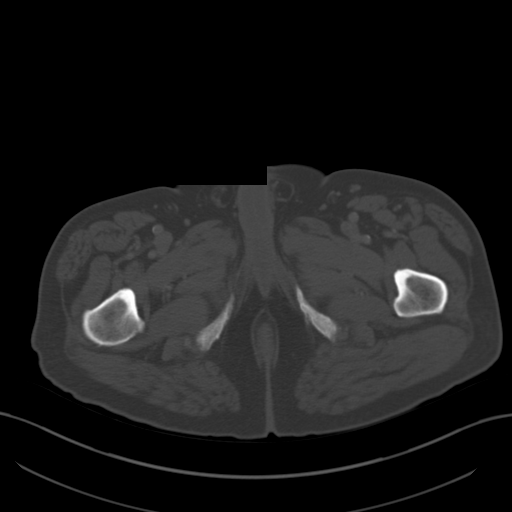
[im 12/97  soft-tissue]
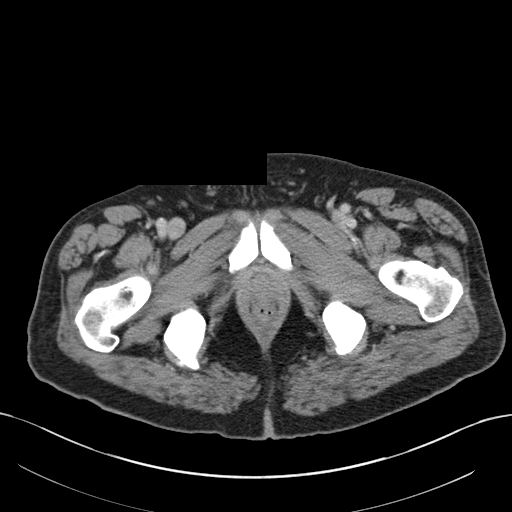
[im 23/97  soft-tissue]
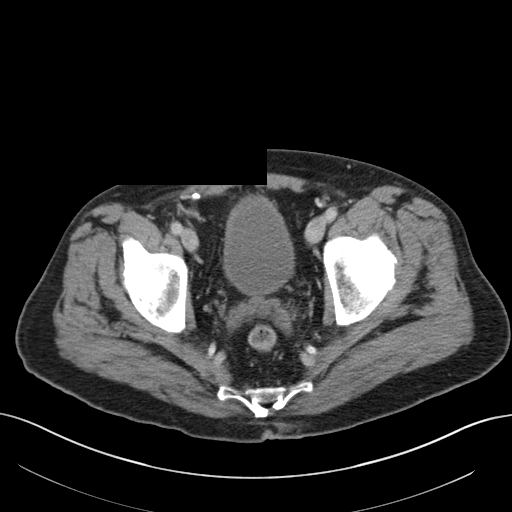
[im 29/97  soft-tissue]
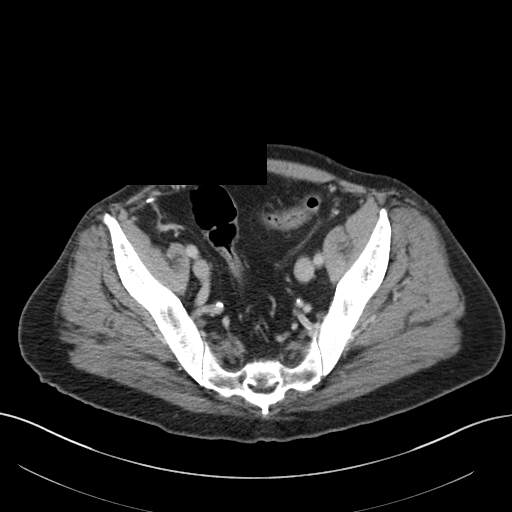
[im 34/97  soft-tissue]
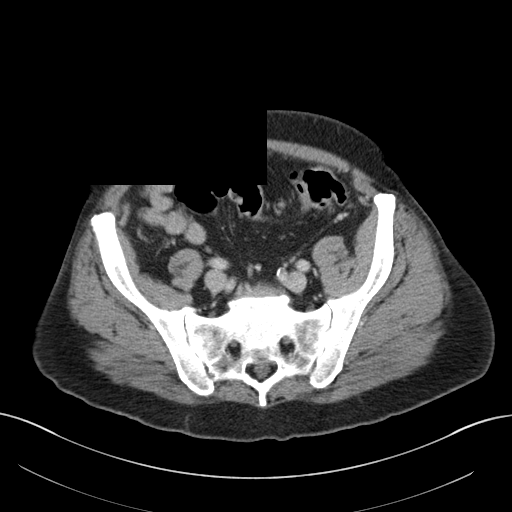
[im 40/97  soft-tissue]
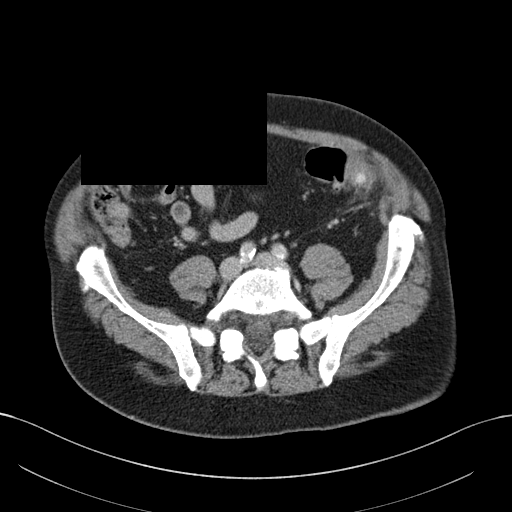
[im 51/97  soft-tissue]
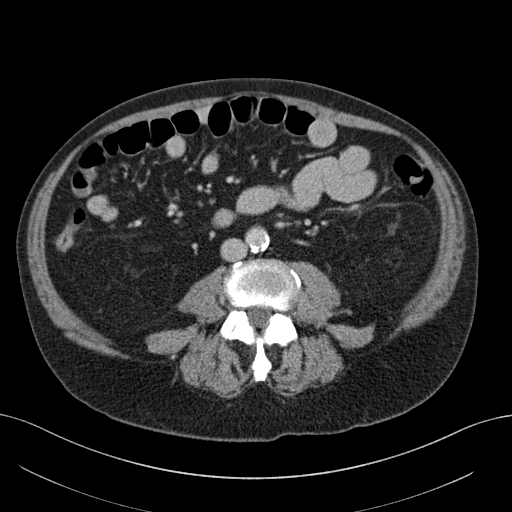
[im 57/97  soft-tissue]
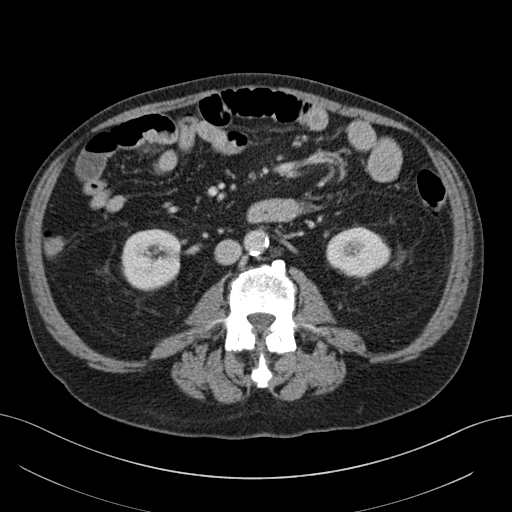
[im 63/97  soft-tissue]
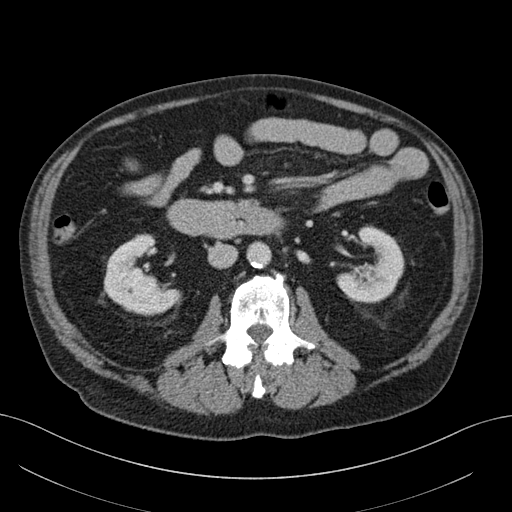
[im 63/97  bone]
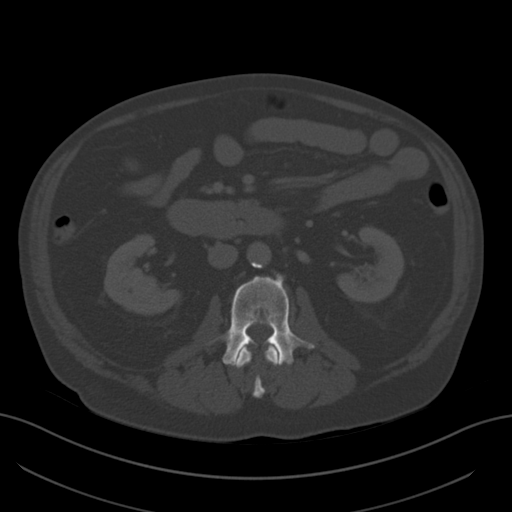
[im 68/97  soft-tissue]
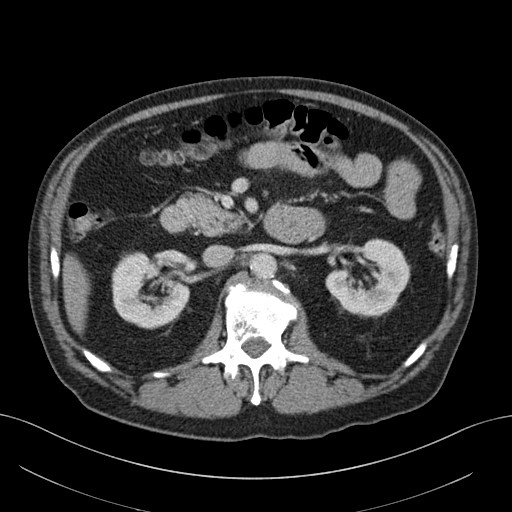
[im 74/97  soft-tissue]
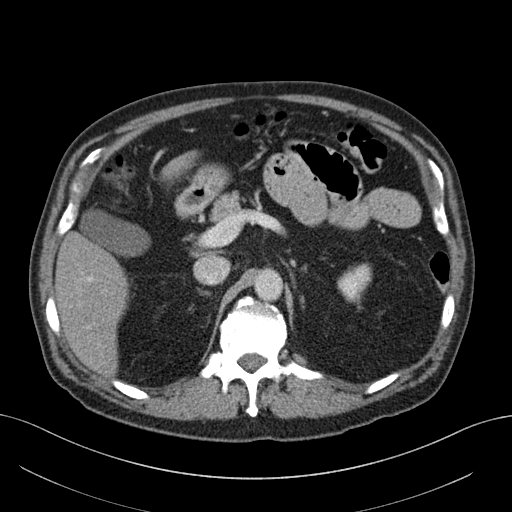
[im 85/97  soft-tissue]
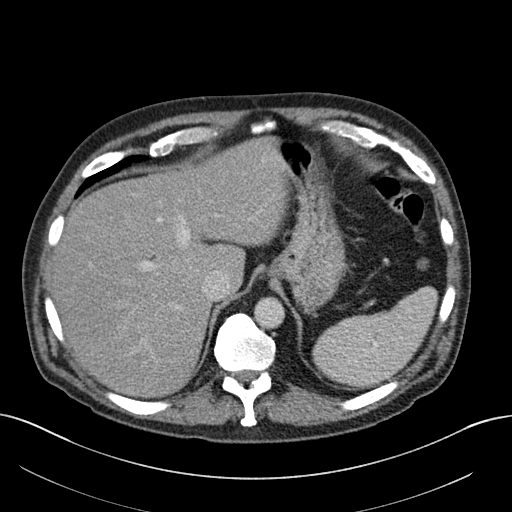
[im 91/97  soft-tissue]
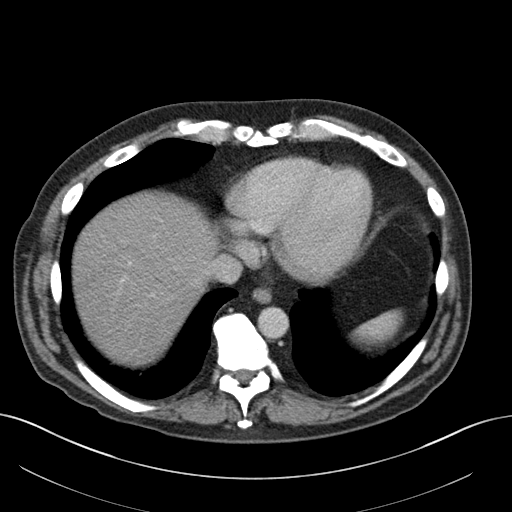

[Series 5: coronal st · coronal · 0.77mm/px · 3 of 151 slices shown]
[im 51/151  soft-tissue]
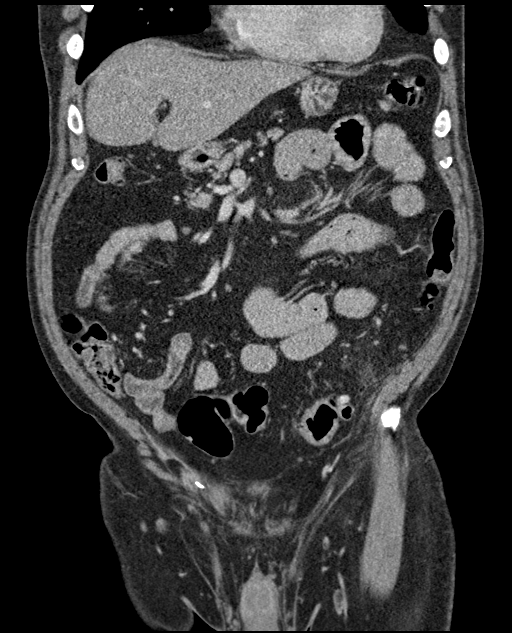
[im 67/151  soft-tissue]
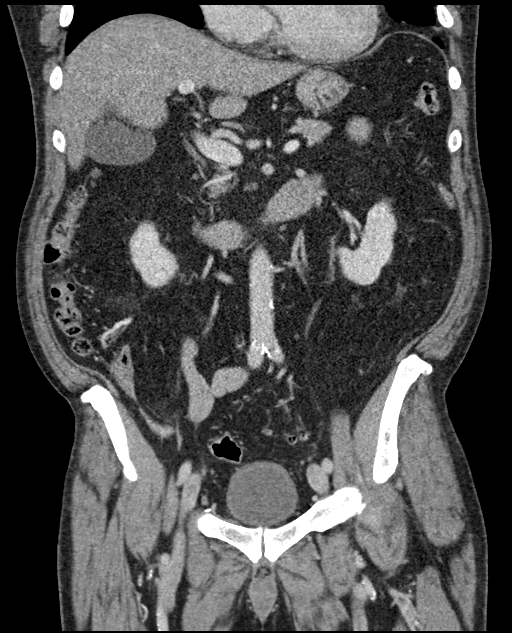
[im 84/151  soft-tissue]
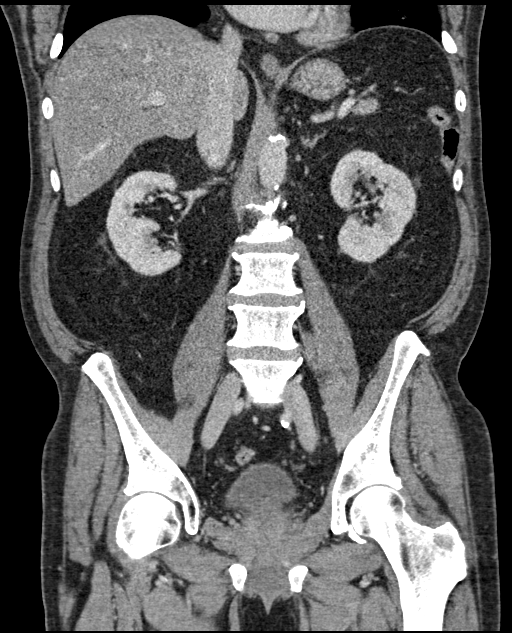

[16 of 46 positions shown; findings below may reference images not displayed]

FINDINGS: Lower chest: Focal consolidation within the lingula, likely
atelectasis.

Hepatobiliary: Liver is diffusely low in density suggesting fatty
infiltration. Gallbladder appears normal. No bile duct dilatation
seen.

Pancreas: Unremarkable. No pancreatic ductal dilatation or
surrounding inflammatory changes.

Spleen: Normal in size without focal abnormality.

Adrenals/Urinary Tract: Adrenal glands appear normal. Kidneys are
unremarkable without mass, stone or hydronephrosis. No ureteral or
bladder calculi identified. Bladder is unremarkable, partially
decompressed.

Stomach/Bowel: Focal thickening of the walls of the LEFT colon,
lower descending colon, consistent with acute diverticulitis. No
pericolonic abscess collection or free intraperitoneal air. No
dilated large or small bowel loops. Stomach is unremarkable,
partially decompressed.

Vascular/Lymphatic: Aortic atherosclerosis. No acute appearing
vascular abnormality. No enlarged lymph nodes seen in the abdomen or
pelvis.

Reproductive: Prostate gland is slightly prominent in size with
nodular thickening anterior-superiorly causing mass effect on the
bladder base.

Other: No free fluid or abscess collection. No free intraperitoneal
air.

Musculoskeletal: Chronic bilateral pars interarticularis defects at
L5-S1 with resultant grade 1 anterolisthesis of L5 on S1. No acute
or suspicious osseous finding. Superficial soft tissues are
unremarkable.
IMPRESSION: 1. Acute uncomplicated diverticulitis of the lower descending colon.
No pericolonic abscess collection or free intraperitoneal air.
2. Fatty infiltration of the liver.
3. Prostate gland is slightly prominent in size with nodular
thickening anterior-superiorly causing mass effect on the bladder
base. Recommend correlation with physical exam findings and/or PSA
lab values.
4. Chronic bilateral pars interarticularis defects at L5-S1 with
resultant grade 1 anterolisthesis of L5 on S1.

Aortic Atherosclerosis ([id number]).

## 2021-02-22 NOTE — Progress Notes (Signed)
I called pt to introduce myself as the Prostate Nurse Navigator and the Coordinator of the Prostate [place].   1. I confirmed with the patient he is aware of his referral to the clinic 12/27, arriving @ 12:30 pm.    2. I discussed the format of the clinic and the physicians he will be seeing that day.   3. I discussed where the clinic is located and how to contact me.   4. I confirmed his address and informed him I would be mailing a packet of information and forms to be completed. I asked him to bring them with him the day of his appointment.    He voiced understanding of the above. I asked him to call me if he has any questions or concerns regarding his appointments or the forms he needs to complete.

## 2021-02-24 NOTE — Progress Notes (Signed)
° °                              Care Plan Summary  Name: Jose [name] DOB: [birth year]  Your Medical Team:   Urologist -  Dr. Raynelle Bring, [facility]  Radiation Oncologist - Dr. Tyler Pita, [facility]   Medical Oncologist - Dr. Zola Button, [place]  Recommendations: 1) Radiation or 2) Surgery   * These recommendations are based on information available as of todays consult.      Recommendations may change depending on the results of further tests or exams.    Next Steps: 1) Consider your options.  Once you have made a treatment decision please contact Kathlee Nations, Therapist, sports.    When appointments need to be scheduled, you will be contacted by [facility] and/or [facility].  Questions?  Please do not hesitate to call Katheren Puller, BSN, RN at [phone] with any questions or concerns.  Kathlee Nations is your Oncology Nurse Navigator and is available to assist you while youre receiving your medical care at [facility].

## 2021-02-28 ENCOUNTER — Other Ambulatory Visit: Payer: Self-pay

## 2021-02-28 ENCOUNTER — Ambulatory Visit
Admission: RE | Admit: 2021-02-28 | Discharge: 2021-02-28 | Disposition: A | Discharge status: Home / Self Care | Payer: Managed Care, Other (non HMO) | Source: Ambulatory Visit | Attending: Radiation Oncology | Admitting: Radiation Oncology

## 2021-02-28 ENCOUNTER — Inpatient Hospital Stay: Payer: Managed Care, Other (non HMO) | Attending: Oncology | Admitting: Oncology

## 2021-02-28 ENCOUNTER — Encounter: Payer: Self-pay | Admitting: Genetic Counselor

## 2021-02-28 VITALS — BP 155/79 | HR 67 | Temp 97.4°F | Resp 18 | Ht 66.0 in | Wt 191.0 lb

## 2021-02-28 DIAGNOSIS — E119 Type 2 diabetes mellitus without complications: Secondary | ICD-10-CM

## 2021-02-28 DIAGNOSIS — R351 Nocturia: Secondary | ICD-10-CM | POA: Diagnosis not present

## 2021-02-28 DIAGNOSIS — Z1379 Encounter for other screening for genetic and chromosomal anomalies: Secondary | ICD-10-CM | POA: Insufficient documentation

## 2021-02-28 DIAGNOSIS — C61 Malignant neoplasm of prostate: Secondary | ICD-10-CM

## 2021-02-28 DIAGNOSIS — Z7984 Long term (current) use of oral hypoglycemic drugs: Secondary | ICD-10-CM

## 2021-02-28 NOTE — Progress Notes (Signed)
Radiation Oncology         [phone] ________________________________  Multidisciplinary Prostate Cancer Clinic  Initial Radiation Oncology Consultation  Name: Jose [name] MRN: [id number]  Date: 02/28/2021  DOB: Dec 04, 1950  [id number] Jose Beals, MD  Lucas Mallow, MD   REFERRING PHYSICIAN: Lucas Mallow, MD  DIAGNOSIS: 70 y.o. gentleman with stage T1c adenocarcinoma of the prostate with a Gleason's score of 3+4 and a PSA of 7.78    ICD-10-CM   1. Malignant neoplasm of prostate ([place])  [place] Jose [name] is a 70 y.o. gentleman.  He was noted to have an elevated PSA of 7.49 on 06/23/2019.  The PSA remained elevated at 6.42 in 07/22/2020 and further increased to 7.78 on 10/14/2020 with his primary care physician, Dr. [name].  Accordingly, he was referred for evaluation in urology by Dr. Gloriann Loan on 11/29/20,  digital rectal examination was performed at that time revealing some left-sided induration but no discrete nodularity.  The patient proceeded to transrectal ultrasound with 12 biopsies of the prostate on 02/01/21.  The prostate volume measured 42 cc.  Out of 12 core biopsies, 10 were positive.  The maximum Gleason score was 3+4, and this was seen in the right mid, right base, left apex, left mid, left base (with perineural invasion), left apex lateral, and left mid lateral. Additionally, Gleason 3+3 was seen in the left base lateral (with PNI), right mid lateral (small focus), and right base lateral.    The patient reviewed the biopsy results with his urologist and he has kindly been referred today to the multidisciplinary prostate cancer clinic for presentation of pathology and radiology studies in our conference for discussion of potential radiation treatment options and clinical evaluation.  PREVIOUS RADIATION THERAPY: No  PAST MEDICAL HISTORY:  has a past medical history of ALCOHOL ABUSE (07/27/2008), Allergy, ANXIETY  (07/27/2008), Arthritis, Bifascicular block, CAD (07/27/2008), Cataract, Diabetes mellitus without complication ([place]), GERD (09/18/2006), History of TIA (transient ischemic attack) (may 2010), adenomatous colonic polyps, cardiovascular stress test, Hyperlipidemia, HYPERTENSION (09/18/2006), LOW BACK PAIN (09/18/2006), Myocardial infarction ([place]) (2010), Numbness and tingling, Rosacea (07/27/2008), SEIZURE DISORDER (09/18/2006), THROMBOCYTOPENIA (07/27/2008), and Urinary tract infection (start cipro 01-07-2012).    PAST SURGICAL HISTORY: Past Surgical History:  Procedure Laterality Date   CORONARY STENT PLACEMENT     drug eluting   HERNIA REPAIR     INGUINAL HERNIA REPAIR  01/14/2012   Procedure: LAPAROSCOPIC INGUINAL HERNIA;  Surgeon: Ralene Ok, MD;  Location: [facility];  Service: General;  Laterality: Right;   INSERTION OF MESH  01/14/2012   Procedure: INSERTION OF MESH;  Surgeon: Ralene Ok, MD;  Location: [facility];  Service: General;  Laterality: Right;   KNEE SURGERY     LEFT HEART CATHETERIZATION WITH CORONARY ANGIOGRAM N/A 10/21/2013   Procedure: LEFT HEART CATHETERIZATION WITH CORONARY ANGIOGRAM;  Surgeon: Blane Ohara, MD;  Location: [facility] C[facility] LAB;  Service: Cardiovascular;  Laterality: N/A;   MEDIAL PARTIAL KNEE REPLACEMENT Left     FAMILY HISTORY: family history includes Diabetes in his mother; Emphysema in his father; Heart attack in an other family member; Heart failure in his father; Hyperlipidemia in his mother; Hypertension in his mother; Lung cancer in his mother.  SOCIAL HISTORY:  reports that he has never smoked. He quit smokeless tobacco use about 9 years ago.  His smokeless tobacco use included chew. He reports that he does not drink alcohol and does not  use drugs.  ALLERGIES: Patient has no known allergies.  MEDICATIONS:  Current Outpatient Medications  Medication Sig Dispense Refill   ALPRAZolam (XANAX) 0.5 MG tablet TAKE 1 TABLET BY MOUTH THREE TIMES A DAY AS NEEDED 60  tablet 0   amLODipine (NORVASC) 5 MG tablet Take 1 tablet (5 mg total) by mouth daily. 90 tablet 1   atorvastatin (LIPITOR) 20 MG tablet TAKE 1 TABLET BY MOUTH EVERY DAY 90 tablet 1   Azelaic Acid 15 % cream APPLY 1 APPLICATION TOPICALLY AS DIRECTED. AFTER SKIN IS THOROUGHLY WASHED AND PATTED DRY, GENTLY BUT THOROUGHLY MASSAGE A THIN FILM OF AZELAIC ACID INTO THE AFFECTED AREA TWICE DAILY, IN THE MORNING AND EVENING. APPLIED TO FACE. 50 g 0   BAYER LOW DOSE 81 MG chewable tablet Chew 81 mg by mouth daily.     benazepril (LOTENSIN) 40 MG tablet TAKE 1 TABLET BY MOUTH EVERY DAY 90 tablet 1   bisoprolol (ZEBETA) 10 MG tablet Take 1 tablet (10 mg total) by mouth daily. 90 tablet 3   cholecalciferol (VITAMIN D) 25 MCG (1000 UNIT) tablet Take 1,000 Units by mouth daily.     diclofenac sodium (VOLTAREN) 1 % GEL Apply 2 g topically 2 (two) times daily as needed (joint pain (knees)).      halobetasol (ULTRAVATE) 0.05 % cream Apply topically 2 (two) times daily.     Lancets (ONETOUCH DELICA PLUS [id number]) MISC TEST WITH 1 LANCET PER USE, USE AS DIRECTED (MAX 30DAYS) 100 each 0   metFORMIN (GLUCOPHAGE) 1000 MG tablet TAKE 1 TABLET (1,000 MG TOTAL) BY MOUTH 2 (TWO) TIMES DAILY WITH A MEAL. 180 tablet 1   Multiple Vitamin (MULTIVITAMIN WITH MINERALS) TABS Take 1 tablet by mouth daily.     nitroGLYCERIN (NITROSTAT) 0.4 MG SL tablet DISSOLVE 1 TABLET BY MOUTH UNDER THE TONGUE EVERY 5 MINUTES AS NEEDED FOR CHEST PAIN 75 tablet 5   ONETOUCH VERIO test strip USE TO TEST BLOOD SUGAR ONCE A DAY DX E11.51 - 50 strip 1   vitamin C (ASCORBIC ACID) 500 MG tablet Take 500 mg by mouth daily. Reported on 08/23/2015     Current Facility-Administered Medications  Medication Dose Route Frequency Provider Last Rate Last Admin   0.9 %  sodium chloride infusion  500 mL Intravenous Once Milus Banister, MD        REVIEW OF SYSTEMS:  On review of systems, the patient reports that he is doing well overall. He denies any chest  pain, shortness of breath, cough, fevers, chills, night sweats, unintended weight changes. He denies any bowel disturbances, and denies abdominal pain, nausea or vomiting. He denies any new musculoskeletal or joint aches or pains. His IPSS was 13, indicating moderate urinary symptoms despite taking Flomax daily as prescribed. His SHIM was 7, indicating he has moderate erectile dysfunction. A complete review of systems is obtained and is otherwise negative.   PHYSICAL EXAM:  Wt Readings from Last 3 Encounters:  07/05/20 184 lb 4.8 oz (83.6 kg)  06/13/20 183 lb 6.4 oz (83.2 kg)  06/23/19 184 lb 1.6 oz (83.5 kg)   Temp Readings from Last 3 Encounters:  07/05/20 97.9 F (36.6 C) (Oral)  06/23/19 (!) 97.4 F (36.3 C) (Temporal)  06/21/19 98 F (36.7 C) (Oral)   BP Readings from Last 3 Encounters:  07/05/20 120/70  06/13/20 (!) 160/64  11/10/19 124/70   Pulse Readings from Last 3 Encounters:  07/05/20 67  06/13/20 65  11/10/19 71    /10  In general this is a well appearing Caucasian male in no acute distress. He's alert and oriented x4 and appropriate throughout the examination. Cardiopulmonary assessment is negative for acute distress and he exhibits normal effort.    KPS = 100  100 - Normal; no complaints; no evidence of disease. 90   - Able to carry on normal activity; minor signs or symptoms of disease. 80   - Normal activity with effort; some signs or symptoms of disease. 57   - Cares for self; unable to carry on normal activity or to do active work. 60   - Requires occasional assistance, but is able to care for most of his personal needs. 50   - Requires considerable assistance and frequent medical care. 74   - Disabled; requires special care and assistance. 11   - Severely disabled; hospital admission is indicated although death not imminent. 35   - Very sick; hospital admission necessary; active supportive treatment necessary. 10   - Moribund; fatal processes progressing  rapidly. 0     - Dead  Karnofsky DA, Abelmann [place], Craver LS and Burchenal [facility] [phone]) The use of the nitrogen mustards in the palliative treatment of carcinoma: with particular reference to bronchogenic carcinoma Cancer 1 [id number]   LABORATORY DATA:  Lab Results  Component Value Date   WBC 5.4 07/05/2020   HGB 13.8 07/05/2020   HCT 40.9 07/05/2020   MCV 99.8 07/05/2020   PLT 168.0 07/05/2020   Lab Results  Component Value Date   NA 139 07/05/2020   K 4.7 07/05/2020   CL 100 07/05/2020   CO2 31 07/05/2020   Lab Results  Component Value Date   ALT 36 07/05/2020   AST 45 (H) 07/05/2020   ALKPHOS 58 07/05/2020   BILITOT 0.8 07/05/2020     RADIOGRAPHY: No results found.    IMPRESSION/PLAN: 70 y.o. gentleman with Stage T1c adenocarcinoma of the prostate with a Gleason score of 3+4 and a PSA of 7.78.    We discussed the patient's workup and outlined the nature of prostate cancer in this setting. The patient's T stage, Gleason's score, and PSA put him into the favorable intermediate risk group. Accordingly, he is eligible for a variety of potential treatment options including brachytherapy, 5.5 weeks of external radiation, or prostatectomy. We discussed the available radiation techniques, and focused on the details and logistics of delivery. He is not felt to be an ideal candidate for brachytherapy given his persistent urinary symptoms despite medical management with Flomax.  Therefore, we discussed and outlined the risks, benefits, short and long-term effects associated with external beam radiotherapy and compared and contrasted these with prostatectomy. We discussed the role of SpaceOAR gel in reducing the rectal toxicity associated with radiotherapy. He appears to have a good understanding of his disease and our treatment recommendations which are of curative intent.  He was encouraged to ask questions that were answered to his/their stated satisfaction.  At the end of the conversation the  patient remains undecided regarding his treatment preference and would like to take some additional time to consider his options.  He also met with Dr. Alinda Money and Dr. Alen Blew in clinic today to further discuss treatment options.  We will share our discussion with Dr. Gloriann Loan.  The patient has our contact information and will let us know if he ultimately elects to proceed with radiation.  We enjoyed meeting him and look forward to following along in his care.   We personally spent 60 minutes in  this encounter including chart review, reviewing radiological studies, meeting face-to-face with the patient, entering orders and completing documentation.    Nicholos Johns, PA-C    Tyler Pita, MD  [facility] Direct Dial: [phone]   Fax: [phone] [email or web address]   Skype   [email or web address]   This document serves as a record of services personally performed by Tyler Pita, MD and Freeman Caldron, PA-C. It was created on their behalf by Wilburn Mylar, a trained medical scribe. The creation of this record is based on the scribe's personal observations and the provider's statements to them. This document has been checked and approved by the attending provider.

## 2021-02-28 NOTE — Progress Notes (Signed)
Reason for the request:    Prostate cancer  HPI: I was asked by Dr. Gloriann Loan to evaluate Jose [name] for the evaluation of prostate cancer.  He is a 70 year old man who is reasonably healthy with history of diabetes on metformin was found to have elevated PSA of 7.49 in 2021 and had persistent elevation.  He underwent a prostate biopsy on February 09, 2021 which showed a Gleason score of 3+4 = 7-10 out of 12 cores.  Clinically, he does report persistent lower urinary tract symptoms including frequency and nocturia despite tamsulosin.  He denies any bone pain or pathological fractures.  He denies any hospitalizations or illnesses.  He remains reasonably active with excellent home status.   He does not report any headaches, blurry vision, syncope or seizures. Does not report any fevers, chills or sweats.  Does not report any cough, wheezing or hemoptysis.  Does not report any chest pain, palpitation, orthopnea or leg edema.  Does not report any nausea, vomiting or abdominal pain.  Does not report any constipation or diarrhea.  Does not report any skeletal complaints.    Does not report frequency, urgency or hematuria.  Does not report any skin rashes or lesions. Does not report any heat or cold intolerance.  Does not report any lymphadenopathy or petechiae.  Does not report any anxiety or depression.  Remaining review of systems is negative.     Past Medical History:  Diagnosis Date   ALCOHOL ABUSE 07/27/2008   Allergy    ANXIETY 07/27/2008   Arthritis    Bifascicular block    beta blocker stopped due to profound bradycardia   CAD 07/27/2008   a. s/p Endeavor DES to CFX 07/2008; residual OM1 70%, EF 45%;  b. relook cath 5/10: patent stent in CFX;  c.  LHC (8/15):  prox LAD 30%, OM1 50% at bifurcation, OM1 sub-branch 50%, mid AVCFX stent patent, EF 55-65% - no change from 2010 >>> Med Rx (after abnormal nuc)    Cataract    Diabetes mellitus without complication ([place])    GERD 09/18/2006   History of TIA  (transient ischemic attack) may 2010   Hx of adenomatous colonic polyps    Hx of cardiovascular stress test    ETT-Myoview (09/2013):  Partially fixed defect with some reversibility - cannot r/o inf ischemia vs diaph atten, EF 51%; mod risk   Hyperlipidemia    HYPERTENSION 09/18/2006   LOW BACK PAIN 09/18/2006   Myocardial infarction ([place]) 2010   Numbness and tingling    finger tips at times   Rosacea 07/27/2008   SEIZURE DISORDER 09/18/2006   none since 2008   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   Urinary tract infection start cipro 01-07-2012  :   Past Surgical History:  Procedure Laterality Date   CORONARY STENT PLACEMENT     drug eluting   HERNIA REPAIR     INGUINAL HERNIA REPAIR  01/14/2012   Procedure: LAPAROSCOPIC INGUINAL HERNIA;  Surgeon: Ralene Ok, MD;  Location: [facility];  Service: General;  Laterality: Right;   INSERTION OF MESH  01/14/2012   Procedure: INSERTION OF MESH;  Surgeon: Ralene Ok, MD;  Location: [facility];  Service: General;  Laterality: Right;   KNEE SURGERY     LEFT HEART CATHETERIZATION WITH CORONARY ANGIOGRAM N/A 10/21/2013   Procedure: LEFT HEART CATHETERIZATION WITH CORONARY ANGIOGRAM;  Surgeon: Blane Ohara, MD;  Location: [facility] CATH LAB;  Service: Cardiovascular;  Laterality: N/A;   MEDIAL PARTIAL KNEE REPLACEMENT Left   :  Current Outpatient Medications:    ALPRAZolam (XANAX) 0.5 MG tablet, TAKE 1 TABLET BY MOUTH THREE TIMES A DAY AS NEEDED, Disp: 60 tablet, Rfl: 0   amLODipine (NORVASC) 5 MG tablet, Take 1 tablet (5 mg total) by mouth daily., Disp: 90 tablet, Rfl: 1   atorvastatin (LIPITOR) 20 MG tablet, TAKE 1 TABLET BY MOUTH EVERY DAY, Disp: 90 tablet, Rfl: 1   Azelaic Acid 15 % cream, APPLY 1 APPLICATION TOPICALLY AS DIRECTED. AFTER SKIN IS THOROUGHLY WASHED AND PATTED DRY, GENTLY BUT THOROUGHLY MASSAGE A THIN FILM OF AZELAIC ACID INTO THE AFFECTED AREA TWICE DAILY, IN THE MORNING AND EVENING. APPLIED TO FACE., Disp: 50 g, Rfl: 0   BAYER LOW DOSE  81 MG chewable tablet, Chew 81 mg by mouth daily., Disp: , Rfl:    benazepril (LOTENSIN) 40 MG tablet, TAKE 1 TABLET BY MOUTH EVERY DAY, Disp: 90 tablet, Rfl: 1   bisoprolol (ZEBETA) 10 MG tablet, Take 1 tablet (10 mg total) by mouth daily., Disp: 90 tablet, Rfl: 3   cholecalciferol (VITAMIN D) 25 MCG (1000 UNIT) tablet, Take 1,000 Units by mouth daily., Disp: , Rfl:    diclofenac sodium (VOLTAREN) 1 % GEL, Apply 2 g topically 2 (two) times daily as needed (joint pain (knees)). , Disp: , Rfl:    halobetasol (ULTRAVATE) 0.05 % cream, Apply topically 2 (two) times daily., Disp: , Rfl:    Lancets (ONETOUCH DELICA PLUS NOMVEH20N) MISC, TEST WITH 1 LANCET PER USE, USE AS DIRECTED (MAX 30DAYS), Disp: 100 each, Rfl: 0   metFORMIN (GLUCOPHAGE) 1000 MG tablet, TAKE 1 TABLET (1,000 MG TOTAL) BY MOUTH 2 (TWO) TIMES DAILY WITH A MEAL., Disp: 180 tablet, Rfl: 1   Multiple Vitamin (MULTIVITAMIN WITH MINERALS) TABS, Take 1 tablet by mouth daily., Disp: , Rfl:    nitroGLYCERIN (NITROSTAT) 0.4 MG SL tablet, DISSOLVE 1 TABLET BY MOUTH UNDER THE TONGUE EVERY 5 MINUTES AS NEEDED FOR CHEST PAIN, Disp: 75 tablet, Rfl: 5   ONETOUCH VERIO test strip, USE TO TEST BLOOD SUGAR ONCE A DAY DX E11.51 -, Disp: 50 strip, Rfl: 1   vitamin C (ASCORBIC ACID) 500 MG tablet, Take 500 mg by mouth daily. Reported on 08/23/2015, Disp: , Rfl:   Current Facility-Administered Medications:    0.9 %  sodium chloride infusion, 500 mL, Intravenous, Once, Milus Banister, MD:  No Known Allergies:   Family History  Problem Relation Age of Onset   Lung cancer Mother    Hypertension Mother    Diabetes Mother    Hyperlipidemia Mother    Emphysema Father    Heart failure Father    Heart attack Other    Colon cancer Neg Hx    Stroke Neg Hx    Esophageal cancer Neg Hx    Liver cancer Neg Hx    Pancreatic cancer Neg Hx    Stomach cancer Neg Hx    Rectal cancer Neg Hx   :   Social History   Socioeconomic History   Marital status:  Married    Spouse name: Not on file   Number of children: 2   Years of education: Not on file   Highest education level: Not on file  Occupational History   Occupation: Architectural technologist    Employer: [employer]  Tobacco Use   Smoking status: Never   Smokeless tobacco: Former    Types: Chew    Quit date: 02/03/2012  Substance and Sexual Activity   Alcohol use: No  Comment: past drinker 2-3 daily and sometimes binge drinks on the weekend   Drug use: No   Sexual activity: Not on file  Other Topics Concern   Not on file  Social History Narrative   Not on file   Social Determinants of Health   Financial Resource Strain: Not on file  Food Insecurity: Not on file  Transportation Needs: Not on file  Physical Activity: Not on file  Stress: Not on file  Social Connections: Not on file  Intimate Partner Violence: Not on file  :  Pertinent items are noted in HPI.  Exam: ECOG 0 General appearance: alert and cooperative appeared without distress. Head: atraumatic without any abnormalities. Eyes: conjunctivae/corneas clear. PERRL.  Sclera anicteric. Throat: lips, mucosa, and tongue normal; without oral thrush or ulcers. Resp: clear to auscultation bilaterally without rhonchi, wheezes or dullness to percussion. Cardio: regular rate and rhythm, S1, S2 normal, no murmur, click, rub or gallop GI: soft, non-tender; bowel sounds normal; no masses,  no organomegaly Skin: Skin color, texture, turgor normal. No rashes or lesions Lymph nodes: Cervical, supraclavicular, and axillary nodes normal. Neurologic: Grossly normal without any motor, sensory or deep tendon reflexes. Musculoskeletal: No joint deformity or effusion.   Assessment and Plan:    70 year old with prostate cancer diagnosed in December 2022.  He was found to have Gleason score 3+4 =7 in 10 out of 12 cores with PSA 7.49.  His case was discussed in the prostate cancer multidisciplinary clinic  including treatment options.  Pathology results were reviewed with the reviewing pathologist.  Treatment choices including primary surgical therapy versus definitive therapy with radiation iterated.  Risks and benefits of both approaches were discussed.  The role for androgen deprivation was also discussed in conjunction with radiation therapy and at this time could be deferred given his intermediate risk disease.  The role for additional systemic therapy including androgen receptor pathway inhibitors, systemic chemotherapy among others will be deferred unless he has advanced disease in the future.  He will consider these options and determine how to proceed with the future.   30  minutes were dedicated to this visit. The time was spent on reviewing laboratory data, reviewing pathology results, discussing treatment options, and answering questions regarding future plan.    A copy of this consult has been forwarded to the requesting physician.

## 2021-02-28 NOTE — Consult Note (Signed)
Multi-Disciplinary Clinic     02/28/2021   --------------------------------------------------------------------------------   Jose [name]  MRN: [id number]  DOB: [birth year], 70 year old Male  SSN:    PRIMARY CARE:  [name]. [name], MD  REFERRING:  Wonda Cheng. Williams Che,   PROVIDER:  Wendie Simmer, M.D.  TREATING:  Raynelle Bring, M.D.  LOCATION:  [facility] [phone]     --------------------------------------------------------------------------------   CC/HPI: CC: Prostate Cancer   Physician requesting consult: Dr. Alen Blew  PCP: Dr. Lelon Frohlich  Location of consult: [facility]   Mr. Dimmick is a 70 year old gentleman with a past history of CAD s/p cardiac stent (ASA 81 mg), hypertension, hyperlipidemia, and diabetes. He was noted to have an elevated PSA of 7.78 and left side prostate induration prompting a TRUS biopsy of the prostate by Dr. Gloriann Loan on 02/01/21. This demonstrated Gleason 3+4=7 adenocarcinoma with 10 out of 12 biopsy cores positive for malignancy.   Family history: None.   Imaging studies: None.   PMH: He has a history of CAD s/p cardiac stent (ASA 81 mg), hypertension, hyperlipidemia, and diabetes.  PSH: Laparoscopic inguinal hernia repair.   TNM stage: cT2a Nx Mx (left induration)  PSA: 7.78  Gleason score: 3+4=7 (GG 2)  Biopsy (02/01/21): 10/12 cores positive  Left: L lateral apex (60%, 3+4=7), L apex (60%, 3+4=7), L lateral mid (80%, 3+4=7), L mid (90%, 3+4=7), L lateral base (20%, 3+3=6, PNI), L base (90%, 3+4=7, PNI)  Right: R mid (40%, 3+4=7), R lateral mid (10%, 3+3=6), R base (30%, 3+4=7), R lateral base (30%, 3+3=6)  Prostate volume: 42.0 cc   Nomogram  OC disease: 29%  EPE: 70%  SVI: 14%  LNI: 12%  PFS (5 year, 10 year): 67%, 51%   Urinary function: IPSS is 13. He does take tamsulosin since September with mild to moderate benefit noted.  Erectile  function: SHIM score is 7. He has used sildenafil a couple of times with modest benefit recently.     ALLERGIES: No Allergies    MEDICATIONS: Tamsulosin Hcl 0.4 mg capsule 1 capsule PO Daily  Amlodipine Besylate 5 mg tablet  Aspirin Ec 81 mg tablet, delayed release  Atorvastatin Calcium 20 mg tablet  Benazepril Hcl 40 mg tablet  Bisoprolol Fumarate 5 mg tablet  Diclofenac Sodium 1 % gel  Finacea 15 % gel  Halobetasol Propionate 0.05 % cream  Metformin Er Gastric 1,000 mg tablet, er gastric retention 24 hr  Nitroglycerin 0.4 mg tablet, sublingual  One Daily For Men 50+ Advanced  Onetouch Delica 30 gauge each  Onetouch Verio Test Strip strip  Pennsaid 2 % solution in packet  Sildenafil Citrate 100 mg tablet 1 tablet PO prn Take 1 tab po 1hr prior to sexual activities  Vitamin C 1,000 mg tablet  Vitamin D3     GU PSH: Cystoscopy; Implant Stent - 2010 Prostate Needle Biopsy - 02/01/2021     NON-GU PSH: Knee replacement - 11/30/2019, 2017, 2017 Surgical Pathology, Gross And Microscopic Examination For Prostate Needle - 02/01/2021     GU PMH: Nocturia - 02/09/2021, - 11/29/2020 Prostate Cancer - 02/09/2021 Prostate nodule w/ LUTS - 02/09/2021, - 02/01/2021, - 11/29/2020 Elevated PSA - 02/01/2021, - 11/29/2020 Encounter for Prostate Cancer screening - 11/29/2020 Urinary Frequency - 11/29/2020 Weak Urinary Stream - 11/29/2020    NON-GU PMH: Anxiety Arthritis Cardiac arrest, cause unspecified Diabetes Type 2 Hypercholesterolemia Hypertension    FAMILY  HISTORY: 1 son - Other father deceased at age 22 - Other Kidney Cancer - Son Kidney Failure - Son mother deceased at age 80 - Other   SOCIAL HISTORY: Marital Status: Unknown Drinks 1 caffeinated drink per day.    REVIEW OF SYSTEMS:    GU Review Male:   Patient denies leakage of urine, stream starts and stops, hard to postpone urination, get up at night to urinate, have to strain to urinate , frequent urination, burning/ pain  with urination, and trouble starting your streams.  Gastrointestinal (Lower):   Patient denies diarrhea and constipation.  Gastrointestinal (Upper):   Patient denies nausea and vomiting.  Constitutional:   Patient denies fever, night sweats, weight loss, and fatigue.  Skin:   Patient denies skin rash/ lesion and itching.  Eyes:   Patient denies blurred vision and double vision.  Ears/ Nose/ Throat:   Patient denies sore throat and sinus problems.  Hematologic/Lymphatic:   Patient denies swollen glands and easy bruising.  Cardiovascular:   Patient denies leg swelling and chest pains.  Respiratory:   Patient denies cough and shortness of breath.  Endocrine:   Patient denies excessive thirst.  Musculoskeletal:   Patient denies back pain and joint pain.  Neurological:   Patient denies headaches and dizziness.  Psychologic:   Patient denies depression and anxiety.   VITAL SIGNS: None   MULTI-SYSTEM PHYSICAL EXAMINATION:    Constitutional: Well-nourished. No physical deformities. Normally developed. Good grooming.     Complexity of Data:  Lab Test Review:   PSA  Records Review:   Pathology Reports, Previous Patient Records   PROCEDURES: None   ASSESSMENT:      ICD-10 Details  1 GU:   Prostate Cancer - C61    PLAN:            Medications Refill Meds: Tamsulosin Hcl 0.4 mg capsule 1 capsule PO Daily   #90  1 Refill(s)            Document Letter(s):  Created for Patient: Clinical Summary         Notes:   1. Unfavorable intermediate risk prostate cancer: I had a detailed discussion with Mr. Tith and his wife today regarding his prostate cancer situation. I did recommend therapy of curative intent taking into account his age, life expectancy, and his disease parameters. The patient was counseled about the natural history of prostate cancer and the standard treatment options that are available for prostate cancer. It was explained to him how his age and life expectancy, clinical stage,  Gleason score/prognostic grade group, and PSA (and PSA density) affect his prognosis, the decision to proceed with additional staging studies, as well as how that information influences recommended treatment strategies. We discussed the roles for active surveillance, radiation therapy, surgical therapy, androgen deprivation, as well as ablative therapy and other investigational options for the treatment of prostate cancer as appropriate to his individual cancer situation. We discussed the risks and benefits of these options with regard to their impact on cancer control and also in terms of potential adverse events, complications, and impact on quality of life particularly related to urinary and sexual function. The patient was encouraged to ask questions throughout the discussion today and all questions were answered to his stated satisfaction. In addition, the patient was provided with and/or directed to appropriate resources and literature for further education about prostate cancer and treatment options. We discussed surgical therapy for prostate cancer including the different available surgical approaches. We discussed,  in detail, the risks and expectations of surgery with regard to cancer control, urinary control, and erectile function as well as the expected postoperative recovery process. Additional risks of surgery including but not limited to bleeding, infection, hernia formation, nerve damage, lymphocele formation, bowel/rectal injury potentially necessitating colostomy, damage to the urinary tract resulting in urine leakage, urethral stricture, and the cardiopulmonary risks such as myocardial infarction, stroke, death, venothromboembolism, etc. were explained. The risk of open surgical conversion for robotic/laparoscopic prostatectomy was also discussed.   He is scheduled to meet with both Dr. Alen Blew and Dr. Tammi Klippel later this afternoon. He wishes to consider all of his options before making a final  decision regarding treatment. If he does ultimately elect to proceed with surgical therapy, I will plan to obtain a cardiac risk assessment per Richardson Dopp, PA-C who primarily provides his cardiology care. My surgical plan would be to perform a unilateral right nerve sparing robot-assisted laparoscopic radical prostatectomy although he would see me preoperatively for a physical exam including a prostate exam which he deferred today.   CC: Dr. Lelon Frohlich  Dr. Alen Blew  Dr. Zola Button  Dr. Tyler Pita  Richardson Dopp, PA-C    E & M CODES: We spent 44 minutes dedicated to evaluation and management time, including face to face interaction, discussions on coordination of care, documentation, result review, and discussion with others as applicable.

## 2021-03-07 ENCOUNTER — Encounter: Payer: Self-pay | Admitting: General Practice

## 2021-03-07 NOTE — Progress Notes (Signed)
[place] Psychosocial Distress Screening Spiritual Care  Met with Jose [name] by phone following Prostate Multidisciplinary Clinic to introduce [place] team/resources, reviewing distress screen per protocol.  The patient scored a 2 on the Psychosocial Distress Thermometer which indicates mild distress. Also assessed for distress and other psychosocial needs.   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 for the past seven. 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 He reports confidence in his care plan and flexibility to dedicate time to healing.  Provided reflective listening, introduction to Spiritual Care, and encouragement to check out Prostate Cancer Support Group.   Follow up needed: No. Jose [name] plans to reach out as needed/desired.   [place], [place], [facility] Pager [phone] Voicemail [phone]

## 2021-03-14 ENCOUNTER — Ambulatory Visit: Payer: Managed Care, Other (non HMO) | Admitting: Oncology

## 2021-03-15 ENCOUNTER — Telehealth: Payer: Self-pay | Admitting: *Deleted

## 2021-03-15 ENCOUNTER — Other Ambulatory Visit: Payer: Self-pay | Admitting: Urology

## 2021-03-15 NOTE — Telephone Encounter (Signed)
CALLED PATIENT TO INFORM OF FID. MARKER AND SPACE OAR PLACEMENT ON 04-11-21 AND HIS SIM ON 04-13-21- ARRIVAL TIME- 10:45 AM @ [facility], LVM FOR A RETURN CALL

## 2021-03-16 ENCOUNTER — Other Ambulatory Visit: Payer: Self-pay | Admitting: Internal Medicine

## 2021-04-04 ENCOUNTER — Other Ambulatory Visit: Payer: Self-pay

## 2021-04-04 ENCOUNTER — Encounter (HOSPITAL_BASED_OUTPATIENT_CLINIC_OR_DEPARTMENT_OTHER): Payer: Self-pay | Admitting: Urology

## 2021-04-04 NOTE — Progress Notes (Signed)
Spoke w/ via phone for pre-op interview--- Jose [name] needs dos----  Istat             Lab results------Current EKG in [place] dated 06/2020 COVID test -----patient states asymptomatic no test needed Arrive at -------0630 NPO after MN NO Solid Food.   Med rec completed Medications to take morning of surgery ----- Xanax, Norvasc,Bisoprolol. Diabetic medication ----- Instructed not to take Metformin AM of surgery, patient verbalized understanding. Patient instructed no nail polish to be worn day of surgery Patient instructed to bring photo id and insurance card day of surgery Patient aware to have Driver (ride ) / caregiver Jose [name] -Wife    for 24 hours after surgery  Patient Special Instructions ----- Pre-Op special Istructions ----- Patient verbalized understanding of instructions that were given at this phone interview. Patient denies shortness of breath, chest pain, fever, cough at this phone interview.

## 2021-04-06 ENCOUNTER — Other Ambulatory Visit: Payer: Self-pay | Admitting: Internal Medicine

## 2021-04-06 NOTE — H&P (Signed)
CC/HPI: CC: Favorable intermediate risk prostate cancer  HPI:  11/29/2020  71 year old male presents for evaluation of elevated PSA. PSA was 7.49 on 06/23/2019 and 6.42 on 07/05/2020. He again had another PSA that was 7.78 on 10/14/2020. He does have erectile dysfunction. He has a history of cardiac stents. He carries nitroglycerin with him but he has not used it in several years. He takes aspirin 81 mg daily. No other blood thinners. No family history of prostate cancer. He does have significant voiding complaints including nocturia 5 or more times a night, urinary frequency, weak stream.   02/09/2021  Patient underwent a prostate biopsy that revealed a prostate size of 42 g. 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 Maximum Gleason score was 3+4. He did have some induration on the left on prostate exam. Denies any previous abdominal surgeries. Does have some erectile dysfunction that has responded well to the sildenafil. Started taking the tamsulosin and does not have as much voiding complaints.     ALLERGIES: None   MEDICATIONS: Tamsulosin Hcl 0.4 mg capsule 1 capsule PO Daily  Amlodipine Besylate 5 mg tablet  Aspirin Ec 81 mg tablet, delayed release  Atorvastatin Calcium 20 mg tablet  Benazepril Hcl 40 mg tablet  Bisoprolol Fumarate 5 mg tablet  Diclofenac Sodium 1 % gel  Finacea 15 % gel  Halobetasol Propionate 0.05 % cream  Metformin Er Gastric 1,000 mg tablet, er gastric retention 24 hr  Nitroglycerin 0.4 mg tablet, sublingual  One Daily For Men 50+ Advanced  Onetouch Delica 30 gauge each  Onetouch Verio Test Strip strip  Pennsaid 2 % solution in packet  Sildenafil Citrate 100 mg tablet 1 tablet PO prn Take 1 tab po 1hr prior to sexual activities  Vitamin C 1,000 mg tablet  Vitamin D3     GU PSH: Cystoscopy; Implant Stent - 2010 Prostate Needle Biopsy - 02/01/2021     NON-GU PSH: Knee replacement - 11/30/2019, 2017,  2017 Surgical Pathology, Gross And Microscopic Examination For Prostate Needle - 02/01/2021     GU PMH: Elevated PSA - 02/01/2021, - 11/29/2020 Prostate nodule w/ LUTS - 02/01/2021, - 11/29/2020 Encounter for Prostate Cancer screening - 11/29/2020 Nocturia - 11/29/2020 Urinary Frequency - 11/29/2020 Weak Urinary Stream - 11/29/2020    NON-GU PMH: Anxiety Arthritis Cardiac arrest, cause unspecified Diabetes Type 2 Hypercholesterolemia Hypertension    FAMILY HISTORY: 1 son - Other father deceased at age 77 - Other Kidney Cancer - Son Kidney Failure - Son mother deceased at age 66 - Other   SOCIAL HISTORY: Marital Status: Unknown Drinks 1 caffeinated drink per day.    REVIEW OF SYSTEMS:    GU Review Male:   Patient denies frequent urination, hard to postpone urination, burning/ pain with urination, get up at night to urinate, leakage of urine, stream starts and stops, trouble starting your stream, have to strain to urinate , erection problems, and penile pain.  Gastrointestinal (Upper):   Patient denies nausea, vomiting, and indigestion/ heartburn.  Gastrointestinal (Lower):   Patient denies diarrhea and constipation.  Constitutional:   Patient denies fever, night sweats, weight loss, and fatigue.  Skin:   Patient denies skin rash/ lesion and itching.  Eyes:   Patient denies blurred vision and double vision.  Ears/ Nose/ Throat:   Patient denies sinus problems and sore throat.  Hematologic/Lymphatic:   Patient denies swollen glands and easy bruising.  Cardiovascular:   Patient denies leg swelling and chest pains.  Respiratory:  Patient denies cough and shortness of breath.  Endocrine:   Patient denies excessive thirst.  Musculoskeletal:   Patient denies back pain and joint pain.  Neurological:   Patient denies headaches and dizziness.  Psychologic:   Patient denies depression and anxiety.   VITAL SIGNS: None   Complexity of Data:  Source Of History:  Patient  Lab Test  Review:   PSA  Records Review:   Pathology Reports, Previous Patient Records   PROCEDURES:          Urinalysis w/Scope Dipstick Dipstick Cont'd Micro  Color: Amber Bilirubin: Neg mg/dL WBC/hpf: 0 - 5/hpf  Appearance: Cloudy Ketones: Trace mg/dL RBC/hpf: >60/hpf  Specific Gravity: 1.025 Blood: 2+ ery/uL Bacteria: Mod (26-50/hpf)  pH: 5.5 Protein: 1+ mg/dL Cystals: NS (Not Seen)  Glucose: Neg mg/dL Urobilinogen: 1.0 mg/dL Casts: NS (Not Seen)    Nitrites: Neg Trichomonas: Not Present    Leukocyte Esterase: Neg leu/uL Mucous: Present      Epithelial Cells: 0 - 5/hpf      Yeast: NS (Not Seen)      Sperm: Not Present    ASSESSMENT:      ICD-10 Details  1 GU:   Prostate Cancer - C61 Undiagnosed New Problem  2   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 Alinda Money multidisciplinary clinic          Document Letter(s):  Created for Patient: Clinical Summary         Notes:   I went over his pathology report with him today as well as the significance of his Gleason score, number and location of cores positive and percent of cores positive.   The patient was counseled about the natural history of prostate cancer and the standard treatment options that are available for prostate cancer. It was explained to him how his age and life expectancy, clinical stage, Gleason score, and PSA affect his prognosis, the decision to proceed with additional staging studies, as well as how that information influences recommended treatment strategies. We discussed the roles for active surveillance, radiation therapy, surgical therapy, androgen deprivation, as well as ablative therapy options for the treatment of prostate cancer as appropriate to his individual cancer situation. We discussed the risks and benefits of these options with regard  to their impact on cancer control and also in terms of potential adverse events, complications, and impact on quiality of life particularly related to urinary, bowel, and sexual function. The patient was encouraged to ask questions throughout the discussion today and all questions were answered to his stated satisfaction. In addition, the patient was provided with and/or directed to appropriate resources and literature for further education about prostate cancer and treatment options.   We will get him referred over to multidisciplinary clinic for referral to Dr. Alinda Money for discussion of robotic prostatectomy and Dr. Tammi Klippel for discussion of radiation.

## 2021-04-10 NOTE — Progress Notes (Signed)
°  Radiation Oncology         [phone] ________________________________  Name: Jose [name] MRN: [id number]  Date: 04/13/2021  DOB: 28-Mar-1950  SIMULATION AND TREATMENT PLANNING NOTE    ICD-10-CM   1. Malignant neoplasm of prostate ([place])  C61       DIAGNOSIS:  71 y.o. gentleman with stage T1c adenocarcinoma of the prostate with a Gleason's score of 3+4 and a PSA of 7.78  NARRATIVE:  The patient was brought to the [facility].  Identity was confirmed.  All relevant records and images related to the planned course of therapy were reviewed.  The patient freely provided informed written consent to proceed with treatment after reviewing the details related to the planned course of therapy. The consent form was witnessed and verified by the simulation staff.  Then, the patient was set-up in a stable reproducible supine position for radiation therapy.  A vacuum lock pillow device was custom fabricated to position his legs in a reproducible immobilized position.  Then, I performed a urethrogram under sterile conditions to identify the prostatic apex.  CT images were obtained.  Surface markings were placed.  The CT images were loaded into the planning software.  Then the prostate target and avoidance structures including the rectum, bladder, bowel and hips were contoured.  Treatment planning then occurred.  The radiation prescription was entered and confirmed.  A total of one complex treatment devices was fabricated. I have requested : Intensity Modulated Radiotherapy (IMRT) is medically necessary for this case for the following reason:  Rectal sparing.Marland Kitchen  PLAN:  The patient will receive 70 Gy in 28 fractions.  ________________________________  Sheral Apley Tammi Klippel, M.D.

## 2021-04-11 ENCOUNTER — Ambulatory Visit (HOSPITAL_BASED_OUTPATIENT_CLINIC_OR_DEPARTMENT_OTHER): Payer: Managed Care, Other (non HMO) | Admitting: Anesthesiology

## 2021-04-11 ENCOUNTER — Telehealth: Payer: Self-pay | Admitting: *Deleted

## 2021-04-11 ENCOUNTER — Encounter (HOSPITAL_BASED_OUTPATIENT_CLINIC_OR_DEPARTMENT_OTHER)
Admission: RE | Disposition: A | Discharge status: Home / Self Care | Payer: Self-pay | Source: Home / Self Care | Attending: Urology

## 2021-04-11 ENCOUNTER — Encounter (HOSPITAL_BASED_OUTPATIENT_CLINIC_OR_DEPARTMENT_OTHER): Payer: Self-pay | Admitting: Urology

## 2021-04-11 ENCOUNTER — Ambulatory Visit (HOSPITAL_BASED_OUTPATIENT_CLINIC_OR_DEPARTMENT_OTHER)
Admission: RE | Admit: 2021-04-11 | Discharge: 2021-04-11 | Disposition: A | Discharge status: Home / Self Care | Payer: Managed Care, Other (non HMO) | Attending: Urology | Admitting: Urology

## 2021-04-11 DIAGNOSIS — C61 Malignant neoplasm of prostate: Secondary | ICD-10-CM | POA: Diagnosis present

## 2021-04-11 DIAGNOSIS — R351 Nocturia: Secondary | ICD-10-CM | POA: Diagnosis not present

## 2021-04-11 DIAGNOSIS — R350 Frequency of micturition: Secondary | ICD-10-CM | POA: Diagnosis not present

## 2021-04-11 DIAGNOSIS — Z955 Presence of coronary angioplasty implant and graft: Secondary | ICD-10-CM | POA: Insufficient documentation

## 2021-04-11 DIAGNOSIS — Z79899 Other long term (current) drug therapy: Secondary | ICD-10-CM | POA: Diagnosis not present

## 2021-04-11 DIAGNOSIS — N403 Nodular prostate with lower urinary tract symptoms: Secondary | ICD-10-CM | POA: Insufficient documentation

## 2021-04-11 DIAGNOSIS — R3912 Poor urinary stream: Secondary | ICD-10-CM | POA: Insufficient documentation

## 2021-04-11 DIAGNOSIS — I2510 Atherosclerotic heart disease of native coronary artery without angina pectoris: Secondary | ICD-10-CM | POA: Insufficient documentation

## 2021-04-11 DIAGNOSIS — I10 Essential (primary) hypertension: Secondary | ICD-10-CM | POA: Diagnosis not present

## 2021-04-11 DIAGNOSIS — N529 Male erectile dysfunction, unspecified: Secondary | ICD-10-CM | POA: Diagnosis not present

## 2021-04-11 HISTORY — PX: GOLD SEED IMPLANT: SHX6343

## 2021-04-11 LAB — POCT I-STAT, CHEM 8
BUN: 14 mg/dL (ref 8–23)
Calcium, Ion: 1.06 mmol/L — ABNORMAL LOW (ref 1.15–1.40)
Chloride: 100 mmol/L (ref 98–111)
Creatinine, Ser: 1.3 mg/dL — ABNORMAL HIGH (ref 0.61–1.24)
Glucose, Bld: 79 mg/dL (ref 70–99)
HCT: 41 % (ref 39.0–52.0)
Hemoglobin: 13.9 g/dL (ref 13.0–17.0)
Potassium: 3.5 mmol/L (ref 3.5–5.1)
Sodium: 138 mmol/L (ref 135–145)
TCO2: 25 mmol/L (ref 22–32)

## 2021-04-11 LAB — GLUCOSE, CAPILLARY: Glucose-Capillary: 85 mg/dL (ref 70–99)

## 2021-04-11 SURGERY — INSERTION, GOLD SEEDS
Anesthesia: Monitor Anesthesia Care | Site: Prostate

## 2021-04-11 MED ORDER — LACTATED RINGERS IV SOLN: INTRAVENOUS | Status: DC

## 2021-04-11 MED ORDER — BUPIVACAINE HCL 0.25 % IJ SOLN
INTRAMUSCULAR | Status: DC | PRN
  Administered 2021-04-11: 20 mL

## 2021-04-11 MED ORDER — SODIUM CHLORIDE (PF) 0.9 % IJ SOLN
INTRAMUSCULAR | Status: DC | PRN
  Administered 2021-04-11: 10 mL

## 2021-04-11 MED ORDER — FENTANYL CITRATE (PF) 100 MCG/2ML IJ SOLN: 25.0000 ug | INTRAMUSCULAR | Status: DC | PRN

## 2021-04-11 MED ORDER — PROPOFOL 500 MG/50ML IV EMUL
INTRAVENOUS | Status: DC | PRN
  Administered 2021-04-11: 200 ug/kg/min via INTRAVENOUS

## 2021-04-11 MED ORDER — ACETAMINOPHEN 160 MG/5ML PO SOLN: 325.0000 mg | ORAL | Status: DC | PRN

## 2021-04-11 MED ORDER — OXYCODONE HCL 5 MG PO TABS: 5.0000 mg | ORAL_TABLET | Freq: Once | ORAL | Status: DC | PRN

## 2021-04-11 MED ORDER — PROPOFOL 10 MG/ML IV BOLUS
INTRAVENOUS | Status: AC
  Filled 2021-04-11: qty 20

## 2021-04-11 MED ORDER — MEPERIDINE HCL 25 MG/ML IJ SOLN: 6.2500 mg | INTRAMUSCULAR | Status: DC | PRN

## 2021-04-11 MED ORDER — ACETAMINOPHEN 325 MG PO TABS: 325.0000 mg | ORAL_TABLET | ORAL | Status: DC | PRN

## 2021-04-11 MED ORDER — FLEET ENEMA 7-19 GM/118ML RE ENEM: 1.0000 | ENEMA | Freq: Once | RECTAL | Status: DC

## 2021-04-11 MED ORDER — FENTANYL CITRATE (PF) 250 MCG/5ML IJ SOLN
INTRAMUSCULAR | Status: DC | PRN
  Administered 2021-04-11: 25 ug via INTRAVENOUS
  Administered 2021-04-11: 50 ug via INTRAVENOUS

## 2021-04-11 MED ORDER — PROPOFOL 10 MG/ML IV BOLUS
INTRAVENOUS | Status: DC | PRN
  Administered 2021-04-11: 20 mg via INTRAVENOUS
  Administered 2021-04-11: 40 mg via INTRAVENOUS

## 2021-04-11 MED ORDER — CEFAZOLIN SODIUM-DEXTROSE 2-4 GM/100ML-% IV SOLN
INTRAVENOUS | Status: AC
  Filled 2021-04-11: qty 100

## 2021-04-11 MED ORDER — FENTANYL CITRATE (PF) 100 MCG/2ML IJ SOLN
INTRAMUSCULAR | Status: AC
  Filled 2021-04-11: qty 2

## 2021-04-11 MED ORDER — ONDANSETRON HCL 4 MG/2ML IJ SOLN: 4.0000 mg | Freq: Once | INTRAMUSCULAR | Status: DC | PRN

## 2021-04-11 MED ORDER — OXYCODONE HCL 5 MG/5ML PO SOLN: 5.0000 mg | Freq: Once | ORAL | Status: DC | PRN

## 2021-04-11 MED ORDER — PROPOFOL 1000 MG/100ML IV EMUL
INTRAVENOUS | Status: AC
  Filled 2021-04-11: qty 100

## 2021-04-11 MED ORDER — ONDANSETRON HCL 4 MG/2ML IJ SOLN
INTRAMUSCULAR | Status: DC | PRN
Start: 2021-04-11 — End: 2021-04-11
  Administered 2021-04-11: 4 mg via INTRAVENOUS

## 2021-04-11 MED ORDER — CEFAZOLIN SODIUM-DEXTROSE 2-4 GM/100ML-% IV SOLN
2.0000 g | INTRAVENOUS | Status: AC
  Administered 2021-04-11: 2 g via INTRAVENOUS

## 2021-04-11 SURGICAL SUPPLY — 24 items
BLADE CLIPPER SENSICLIP SURGIC (BLADE) ×3 IMPLANT
CNTNR URN SCR LID CUP LEK RST (MISCELLANEOUS) ×4 IMPLANT
CONT SPEC 4OZ STRL OR WHT (MISCELLANEOUS) ×4
COVER BACK TABLE 60X90IN (DRAPES) ×3 IMPLANT
DRSG TEGADERM 4X4.75 (GAUZE/BANDAGES/DRESSINGS) ×3 IMPLANT
DRSG TEGADERM 8X12 (GAUZE/BANDAGES/DRESSINGS) ×3 IMPLANT
GAUZE SPONGE 4X4 12PLY STRL (GAUZE/BANDAGES/DRESSINGS) ×3 IMPLANT
GLOVE SURG ENC MOIS LTX SZ6.5 (GLOVE) ×3 IMPLANT
IMPL SPACEOAR VUE SYSTEM (Spacer) IMPLANT
IMPLANT SPACEOAR VUE SYSTEM (Spacer) ×2 IMPLANT
KIT TURNOVER CYSTO (KITS) ×3 IMPLANT
MARKER GOLD PRELOAD 1.2X3 (Urological Implant) ×2 IMPLANT
MARKER SKIN DUAL TIP RULER LAB (MISCELLANEOUS) ×3 IMPLANT
NDL SPNL 22GX7 QUINCKE BK (NEEDLE) ×2 IMPLANT
NEEDLE HYPO 22GX1.5 SAFETY (NEEDLE) ×1 IMPLANT
NEEDLE SPNL 22GX7 QUINCKE BK (NEEDLE) ×2 IMPLANT
SEED GOLD PRELOAD 1.2X3 (Urological Implant) ×2 IMPLANT
SHEATH ULTRASOUND LF (SHEATH) ×2 IMPLANT
SHEATH ULTRASOUND LTX NONSTRL (SHEATH) ×1 IMPLANT
SURGILUBE 2OZ TUBE FLIPTOP (MISCELLANEOUS) ×3 IMPLANT
SYR 10ML LL (SYRINGE) ×3 IMPLANT
SYR CONTROL 10ML LL (SYRINGE) ×3 IMPLANT
TOWEL OR 17X26 10 PK STRL BLUE (TOWEL DISPOSABLE) ×3 IMPLANT
UNDERPAD 30X36 HEAVY ABSORB (UNDERPADS AND DIAPERS) ×6 IMPLANT

## 2021-04-11 NOTE — Transfer of Care (Signed)
Immediate Anesthesia Transfer of Care Note  Patient: Jose [name]  Procedure(s) Performed: GOLD SEED IMPLANT SPACE OAR (Prostate)  Patient Location: PACU  Anesthesia Type:MAC  Level of Consciousness: awake, alert , oriented and patient cooperative  Airway & Oxygen Therapy: Patient Spontanous Breathing and Patient connected to face mask oxygen  Post-op Assessment: Report given to RN and Post -op Vital signs reviewed and stable  Post vital signs: Reviewed and stable  Last Vitals:  Vitals Value Taken Time  BP 116/66 04/11/21 0901  Temp    Pulse    Resp 21 04/11/21 0903  SpO2    Vitals shown include unvalidated device data.  Last Pain:  Vitals:   04/11/21 0653  TempSrc: Oral  PainSc: 0-No pain      Patients Stated Pain Goal: 7 (54/56/25 6389)  Complications: No notable events documented.

## 2021-04-11 NOTE — Anesthesia Postprocedure Evaluation (Signed)
Anesthesia Post Note  Patient: Jose [name]  Procedure(s) Performed: GOLD SEED IMPLANT SPACE OAR (Prostate)     Patient location during evaluation: PACU Anesthesia Type: MAC Level of consciousness: awake and alert Pain management: pain level controlled Vital Signs Assessment: post-procedure vital signs reviewed and stable Respiratory status: spontaneous breathing, nonlabored ventilation, respiratory function stable and patient connected to nasal cannula oxygen Cardiovascular status: stable and blood pressure returned to baseline Postop Assessment: no apparent nausea or vomiting Anesthetic complications: no   No notable events documented.  Last Vitals:  Vitals:   04/11/21 0945 04/11/21 1014  BP: (!) 142/62 (!) 141/62  Pulse: (!) 50 (!) 58  Resp: 12 16  Temp:  36.6 C  SpO2: 95% 96%    Last Pain:  Vitals:   04/11/21 1014  TempSrc:   PainSc: 0-No pain                 Konnor Jorden

## 2021-04-11 NOTE — Op Note (Signed)
Preoperative diagnosis: Clinically localized adenocarcinoma of the prostate (cT1c)  Postoperative diagnosis: Clinically localized adenocarcinoma of the prostate  Procedure: 1) Placement of fiducial markers into prostate                    2) Insertion of SpaceOAR hydrogel   Surgeon: Jacalyn Lefevre, MD   Anesthesia: General  EBL: Minimal  Complications: None  Indication: Jose [name] is a 71 y.o. gentleman with clinically localized prostate cancer. After discussing management options for treatment, he elected to proceed with radiotherapy. He presents today for the above procedures. The potential risks, complications, alternative options, and expected recovery course have been discussed in detail with the patient and he has provided informed consent to proceed.  Description of procedure: The patient was administered preoperative antibiotics, placed in the dorsal lithotomy position, and prepped and draped in the usual sterile fashion. Next, transrectal ultrasonography was utilized to visualize the prostate. Three gold fiducial markers were then placed into the prostate via transperineal needles under ultrasound guidance at the right apex, right base, and left mid gland under direct ultrasound guidance. A site in the midline was then selected on the perineum for placement of an 18 g needle with saline. The needle was advanced above the rectum and below Denonvillier's fascia to the mid gland and confirmed to be in the midline on transverse imaging. One cc of saline was injected confirming appropriate expansion of this space. A total of 5 cc of saline was then injected to open the space further bilaterally. The saline syringe was then removed and the SpaceOAR hydrogel was injected with good distribution bilaterally. He tolerated the procedure well and without complications. He was given a voiding trial prior to discharge from the PACU.

## 2021-04-11 NOTE — Telephone Encounter (Signed)
Called patient to remind of sim appt. on 04-13-21- arrival time- 10:45 am @ [facility], patient to arrive with a full bladder an empty bowel, spoke with patient and he verified understanding this appt. and his instructions

## 2021-04-11 NOTE — Interval H&P Note (Signed)
History and Physical Interval Note:  04/11/2021 8:09 AM  Jose [name]  has presented today for surgery, with the diagnosis of PROSTATE CANCER.  The various methods of treatment have been discussed with the patient and family. After consideration of risks, benefits and other options for treatment, the patient has consented to  Procedure(s): GOLD SEED IMPLANT SPACE OAR (N/A) as a surgical intervention.  The patient's history has been reviewed, patient examined, no change in status, stable for surgery.  I have reviewed the patient's chart and labs.  Questions were answered to the patient's satisfaction.     Vilda Zollner D Curlie Macken

## 2021-04-11 NOTE — Discharge Instructions (Addendum)
°  Post Anesthesia Home Care Instructions  Activity: Get plenty of rest for the remainder of the day. A responsible adult should stay with you for 24 hours following the procedure.  For the next 24 hours, DO NOT: -Drive a car -Paediatric nurse -Drink alcoholic beverages -Take any medication unless instructed by your physician -Make any legal decisions or sign important papers.  Meals: Start with liquid foods such as gelatin or soup. Progress to regular foods as tolerated. Avoid greasy, spicy, heavy foods. If nausea and/or vomiting occur, drink only clear liquids until the nausea and/or vomiting subsides. Call your physician if vomiting continues.  Special Instructions/Symptoms: Your throat may feel dry or sore from the anesthesia or the breathing tube placed in your throat during surgery. If this causes discomfort, gargle with warm salt water. The discomfort should disappear within 24 hours.  If you had a scopolamine patch placed behind your ear for the management of post- operative nausea and/or vomiting:  1. The medication in the patch is effective for 72 hours, after which it should be removed.  Wrap patch in a tissue and discard in the trash. Wash hands thoroughly with soap and water. 2. You may remove the patch earlier than 72 hours if you experience unpleasant side effects which may include dry mouth, dizziness or visual disturbances. 3. Avoid touching the patch. Wash your hands with soap and water after contact with the patch.   Fiducial Gold Markers and Space OAR   Diet Resume your usual diet when you return home. To keep your bowels moving easily and softly, drink prune, apple and cranberry juice at room temperature. You may also take a stool softener, such as Colace, which is available without prescription at local pharmacies. Daily activities No driving or heavy lifting for at least two days after the implant. No bike riding, horseback riding or riding lawn mowers for the first  month after the implant. Any strenuous physical activity should be approved by your doctor before you resume it. Sexual relations You may resume sexual relations two weeks after the procedure. Your semen may be dark brown or black; this is normal and is related bleeding that may have occurred during the implant. Postoperative swelling Expect swelling and bruising of the scrotum and perineum (the area between the scrotum and anus). Both the swelling and the bruising should resolve in l or 2 weeks. Ice packs and over- the-counter medications such as Tylenol, Advil or Aleve may lessen your discomfort. Postoperative urination Most men experience burning on urination and/or urinary frequency. If this becomes bothersome, contact your Urologist.  Medication can be prescribed to relieve these problems.  It is normal to have some blood in your urine for a few days after the implant.

## 2021-04-11 NOTE — Anesthesia Preprocedure Evaluation (Addendum)
Anesthesia Evaluation  Patient identified by MRN, date of birth, ID band Patient awake    Reviewed: Allergy & Precautions, H&P , NPO status , Patient's Chart, lab work & pertinent test results  Airway Mallampati: I  TM Distance: >3 FB Neck ROM: Full    Dental  (+) Teeth Intact, Dental Advisory Given, Poor Dentition   Pulmonary neg pulmonary ROS,    Pulmonary exam normal breath sounds clear to auscultation       Cardiovascular hypertension, Pt. on medications + CAD and + Cardiac Stents  Normal cardiovascular exam+ dysrhythmias  Rhythm:Regular Rate:Normal   S/p DES to LCx in 07/2008 Cath 10/2013: patent LCx stent    Neuro/Psych Seizures -,  Anxiety TIAnegative neurological ROS     GI/Hepatic GERD  Medicated,  Endo/Other  negative endocrine ROSdiabetes  Renal/GU negative Renal ROS     Musculoskeletal  (+) Arthritis ,   Abdominal   Peds  Hematology negative hematology ROS (+)   Anesthesia Other Findings   Reproductive/Obstetrics                           Anesthesia Physical  Anesthesia Plan  ASA: 3  Anesthesia Plan: MAC   Post-op Pain Management: Minimal or no pain anticipated   Induction: Intravenous  PONV Risk Score and Plan: 1 and Propofol infusion  Airway Management Planned: Nasal Cannula, Natural Airway, Simple Face Mask and Mask  Additional Equipment:   Intra-op Plan:   Post-operative Plan:   Informed Consent: I have reviewed the patients History and Physical, chart, labs and discussed the procedure including the risks, benefits and alternatives for the proposed anesthesia with the patient or authorized representative who has indicated his/her understanding and acceptance.     Dental advisory given  Plan Discussed with: CRNA and Anesthesiologist  Anesthesia Plan Comments:        Anesthesia Quick Evaluation

## 2021-04-12 ENCOUNTER — Telehealth: Payer: Self-pay | Admitting: *Deleted

## 2021-04-12 ENCOUNTER — Other Ambulatory Visit: Payer: Self-pay | Admitting: Internal Medicine

## 2021-04-12 NOTE — Telephone Encounter (Signed)
CALLED PATIENT TO REMIND OF SIM APPT. FOR 04-13-21- ARRIVAL TIME- 10:45 AM @ [facility], PATIENT INFORMED TO COME WITH A FULL BLADDER AND A EMPTY BOWEL, SPOKE WITH PATIENT AND HE IS AWARE OF THIS APPT. AND THE INSTRUCTIONS

## 2021-04-13 ENCOUNTER — Encounter (HOSPITAL_BASED_OUTPATIENT_CLINIC_OR_DEPARTMENT_OTHER): Payer: Self-pay | Admitting: Urology

## 2021-04-13 ENCOUNTER — Ambulatory Visit
Admission: RE | Admit: 2021-04-13 | Discharge: 2021-04-13 | Disposition: A | Discharge status: Home / Self Care | Payer: Managed Care, Other (non HMO) | Source: Ambulatory Visit | Attending: Radiation Oncology | Admitting: Radiation Oncology

## 2021-04-13 DIAGNOSIS — C61 Malignant neoplasm of prostate: Secondary | ICD-10-CM | POA: Insufficient documentation

## 2021-04-13 DIAGNOSIS — Z510 Encounter for antineoplastic radiation therapy: Secondary | ICD-10-CM | POA: Insufficient documentation

## 2021-04-14 DIAGNOSIS — Z510 Encounter for antineoplastic radiation therapy: Secondary | ICD-10-CM | POA: Diagnosis not present

## 2021-04-25 ENCOUNTER — Ambulatory Visit
Admission: RE | Admit: 2021-04-25 | Discharge: 2021-04-25 | Disposition: A | Discharge status: Home / Self Care | Payer: Managed Care, Other (non HMO) | Source: Ambulatory Visit | Attending: Radiation Oncology | Admitting: Radiation Oncology

## 2021-04-25 ENCOUNTER — Other Ambulatory Visit: Payer: Self-pay

## 2021-04-25 DIAGNOSIS — Z510 Encounter for antineoplastic radiation therapy: Secondary | ICD-10-CM | POA: Diagnosis not present

## 2021-04-26 ENCOUNTER — Ambulatory Visit
Admission: RE | Admit: 2021-04-26 | Discharge: 2021-04-26 | Disposition: A | Discharge status: Home / Self Care | Payer: Managed Care, Other (non HMO) | Source: Ambulatory Visit | Attending: Radiation Oncology | Admitting: Radiation Oncology

## 2021-04-26 DIAGNOSIS — Z510 Encounter for antineoplastic radiation therapy: Secondary | ICD-10-CM | POA: Diagnosis not present

## 2021-04-27 ENCOUNTER — Ambulatory Visit
Admission: RE | Admit: 2021-04-27 | Discharge: 2021-04-27 | Disposition: A | Discharge status: Home / Self Care | Payer: Managed Care, Other (non HMO) | Source: Ambulatory Visit | Attending: Radiation Oncology | Admitting: Radiation Oncology

## 2021-04-27 ENCOUNTER — Other Ambulatory Visit: Payer: Self-pay

## 2021-04-27 DIAGNOSIS — Z510 Encounter for antineoplastic radiation therapy: Secondary | ICD-10-CM | POA: Diagnosis not present

## 2021-04-28 ENCOUNTER — Ambulatory Visit
Admission: RE | Admit: 2021-04-28 | Discharge: 2021-04-28 | Disposition: A | Discharge status: Home / Self Care | Payer: Managed Care, Other (non HMO) | Source: Ambulatory Visit | Attending: Radiation Oncology | Admitting: Radiation Oncology

## 2021-04-28 DIAGNOSIS — Z510 Encounter for antineoplastic radiation therapy: Secondary | ICD-10-CM | POA: Diagnosis not present

## 2021-04-28 NOTE — Progress Notes (Signed)
Pt here for patient teaching. Pt given Radiation and You booklet and skin care instructions.  Reviewed areas of pertinence such as diarrhea, fatigue, hair loss, nausea and vomiting, sexual and fertility changes, skin changes, and urinary and bladder changes. Pt able to give teach back of to pat skin, use unscented/gentle soap, use baby wipes, have Imodium on hand, drink plenty of water, and sitz bath, avoid applying anything to skin within 4 hours of treatment and to use an electric razor if they must shave. Pt verbalizes understanding of information given and will contact nursing with any questions or concerns.     Http://rtanswers.org/treatmentinformation/whattoexpect/index      

## 2021-05-01 ENCOUNTER — Other Ambulatory Visit: Payer: Self-pay

## 2021-05-01 ENCOUNTER — Ambulatory Visit
Admission: RE | Admit: 2021-05-01 | Discharge: 2021-05-01 | Disposition: A | Discharge status: Home / Self Care | Payer: Managed Care, Other (non HMO) | Source: Ambulatory Visit | Attending: Radiation Oncology | Admitting: Radiation Oncology

## 2021-05-01 DIAGNOSIS — Z510 Encounter for antineoplastic radiation therapy: Secondary | ICD-10-CM | POA: Diagnosis not present

## 2021-05-02 ENCOUNTER — Ambulatory Visit
Admission: RE | Admit: 2021-05-02 | Discharge: 2021-05-02 | Disposition: A | Discharge status: Home / Self Care | Payer: Managed Care, Other (non HMO) | Source: Ambulatory Visit | Attending: Radiation Oncology | Admitting: Radiation Oncology

## 2021-05-02 DIAGNOSIS — Z510 Encounter for antineoplastic radiation therapy: Secondary | ICD-10-CM | POA: Diagnosis not present

## 2021-05-03 ENCOUNTER — Other Ambulatory Visit: Payer: Self-pay

## 2021-05-03 ENCOUNTER — Ambulatory Visit
Admission: RE | Admit: 2021-05-03 | Discharge: 2021-05-03 | Disposition: A | Discharge status: Home / Self Care | Payer: Managed Care, Other (non HMO) | Source: Ambulatory Visit | Attending: Radiation Oncology | Admitting: Radiation Oncology

## 2021-05-03 DIAGNOSIS — Z510 Encounter for antineoplastic radiation therapy: Secondary | ICD-10-CM | POA: Insufficient documentation

## 2021-05-03 DIAGNOSIS — C61 Malignant neoplasm of prostate: Secondary | ICD-10-CM | POA: Insufficient documentation

## 2021-05-04 ENCOUNTER — Ambulatory Visit
Admission: RE | Admit: 2021-05-04 | Discharge: 2021-05-04 | Disposition: A | Discharge status: Home / Self Care | Payer: Managed Care, Other (non HMO) | Source: Ambulatory Visit | Attending: Radiation Oncology | Admitting: Radiation Oncology

## 2021-05-04 DIAGNOSIS — Z510 Encounter for antineoplastic radiation therapy: Secondary | ICD-10-CM | POA: Diagnosis not present

## 2021-05-05 ENCOUNTER — Ambulatory Visit
Admission: RE | Admit: 2021-05-05 | Discharge: 2021-05-05 | Disposition: A | Discharge status: Home / Self Care | Payer: Managed Care, Other (non HMO) | Source: Ambulatory Visit | Attending: Radiation Oncology | Admitting: Radiation Oncology

## 2021-05-05 DIAGNOSIS — Z510 Encounter for antineoplastic radiation therapy: Secondary | ICD-10-CM | POA: Diagnosis not present

## 2021-05-08 ENCOUNTER — Ambulatory Visit
Admission: RE | Admit: 2021-05-08 | Discharge: 2021-05-08 | Disposition: A | Discharge status: Home / Self Care | Payer: Managed Care, Other (non HMO) | Source: Ambulatory Visit | Attending: Radiation Oncology | Admitting: Radiation Oncology

## 2021-05-08 ENCOUNTER — Other Ambulatory Visit: Payer: Self-pay

## 2021-05-08 DIAGNOSIS — Z510 Encounter for antineoplastic radiation therapy: Secondary | ICD-10-CM | POA: Diagnosis not present

## 2021-05-09 ENCOUNTER — Ambulatory Visit
Admission: RE | Admit: 2021-05-09 | Discharge: 2021-05-09 | Disposition: A | Discharge status: Home / Self Care | Payer: Managed Care, Other (non HMO) | Source: Ambulatory Visit | Attending: Radiation Oncology | Admitting: Radiation Oncology

## 2021-05-09 DIAGNOSIS — Z510 Encounter for antineoplastic radiation therapy: Secondary | ICD-10-CM | POA: Diagnosis not present

## 2021-05-10 ENCOUNTER — Ambulatory Visit
Admission: RE | Admit: 2021-05-10 | Discharge: 2021-05-10 | Disposition: A | Discharge status: Home / Self Care | Payer: Managed Care, Other (non HMO) | Source: Ambulatory Visit | Attending: Radiation Oncology | Admitting: Radiation Oncology

## 2021-05-10 ENCOUNTER — Other Ambulatory Visit: Payer: Self-pay

## 2021-05-10 DIAGNOSIS — Z510 Encounter for antineoplastic radiation therapy: Secondary | ICD-10-CM | POA: Diagnosis not present

## 2021-05-11 ENCOUNTER — Ambulatory Visit
Admission: RE | Admit: 2021-05-11 | Discharge: 2021-05-11 | Disposition: A | Discharge status: Home / Self Care | Payer: Managed Care, Other (non HMO) | Source: Ambulatory Visit | Attending: Radiation Oncology | Admitting: Radiation Oncology

## 2021-05-11 DIAGNOSIS — Z510 Encounter for antineoplastic radiation therapy: Secondary | ICD-10-CM | POA: Diagnosis not present

## 2021-05-12 ENCOUNTER — Ambulatory Visit: Payer: Managed Care, Other (non HMO)

## 2021-05-15 ENCOUNTER — Ambulatory Visit
Admission: RE | Admit: 2021-05-15 | Discharge: 2021-05-15 | Disposition: A | Discharge status: Home / Self Care | Payer: Managed Care, Other (non HMO) | Source: Ambulatory Visit | Attending: Radiation Oncology | Admitting: Radiation Oncology

## 2021-05-15 ENCOUNTER — Other Ambulatory Visit: Payer: Self-pay

## 2021-05-15 DIAGNOSIS — Z510 Encounter for antineoplastic radiation therapy: Secondary | ICD-10-CM | POA: Diagnosis not present

## 2021-05-16 ENCOUNTER — Ambulatory Visit
Admission: RE | Admit: 2021-05-16 | Discharge: 2021-05-16 | Disposition: A | Discharge status: Home / Self Care | Payer: Managed Care, Other (non HMO) | Source: Ambulatory Visit | Attending: Radiation Oncology | Admitting: Radiation Oncology

## 2021-05-16 DIAGNOSIS — Z510 Encounter for antineoplastic radiation therapy: Secondary | ICD-10-CM | POA: Diagnosis not present

## 2021-05-17 ENCOUNTER — Ambulatory Visit
Admission: RE | Admit: 2021-05-17 | Discharge: 2021-05-17 | Disposition: A | Discharge status: Home / Self Care | Payer: Managed Care, Other (non HMO) | Source: Ambulatory Visit | Attending: Radiation Oncology | Admitting: Radiation Oncology

## 2021-05-17 ENCOUNTER — Other Ambulatory Visit: Payer: Self-pay

## 2021-05-17 DIAGNOSIS — Z510 Encounter for antineoplastic radiation therapy: Secondary | ICD-10-CM | POA: Diagnosis not present

## 2021-05-18 ENCOUNTER — Ambulatory Visit
Admission: RE | Admit: 2021-05-18 | Discharge: 2021-05-18 | Disposition: A | Discharge status: Home / Self Care | Payer: Managed Care, Other (non HMO) | Source: Ambulatory Visit | Attending: Radiation Oncology | Admitting: Radiation Oncology

## 2021-05-18 DIAGNOSIS — Z510 Encounter for antineoplastic radiation therapy: Secondary | ICD-10-CM | POA: Diagnosis not present

## 2021-05-19 ENCOUNTER — Ambulatory Visit
Admission: RE | Admit: 2021-05-19 | Discharge: 2021-05-19 | Disposition: A | Discharge status: Home / Self Care | Payer: Managed Care, Other (non HMO) | Source: Ambulatory Visit | Attending: Radiation Oncology | Admitting: Radiation Oncology

## 2021-05-19 ENCOUNTER — Other Ambulatory Visit: Payer: Self-pay

## 2021-05-19 DIAGNOSIS — Z510 Encounter for antineoplastic radiation therapy: Secondary | ICD-10-CM | POA: Diagnosis not present

## 2021-05-22 ENCOUNTER — Ambulatory Visit
Admission: RE | Admit: 2021-05-22 | Discharge: 2021-05-22 | Disposition: A | Discharge status: Home / Self Care | Payer: Managed Care, Other (non HMO) | Source: Ambulatory Visit | Attending: Radiation Oncology | Admitting: Radiation Oncology

## 2021-05-22 ENCOUNTER — Other Ambulatory Visit: Payer: Self-pay

## 2021-05-22 DIAGNOSIS — Z510 Encounter for antineoplastic radiation therapy: Secondary | ICD-10-CM | POA: Diagnosis not present

## 2021-05-23 ENCOUNTER — Ambulatory Visit
Admission: RE | Admit: 2021-05-23 | Discharge: 2021-05-23 | Disposition: A | Discharge status: Home / Self Care | Payer: Managed Care, Other (non HMO) | Source: Ambulatory Visit | Attending: Radiation Oncology | Admitting: Radiation Oncology

## 2021-05-23 ENCOUNTER — Other Ambulatory Visit: Payer: Self-pay

## 2021-05-23 DIAGNOSIS — Z510 Encounter for antineoplastic radiation therapy: Secondary | ICD-10-CM | POA: Diagnosis not present

## 2021-05-23 MED ORDER — ONDANSETRON HCL 8 MG PO TABS: 8.0000 mg | ORAL_TABLET | Freq: Three times a day (TID) | ORAL | 0 refills | Status: DC | PRN

## 2021-05-23 NOTE — Progress Notes (Signed)
Patient called to report new onset of vomiting/nausea/diarrhea/and pain.   ? ?Patient with history of Stage T1c adenocarcinoma of the prostate with a Gleason score of 3+4 and a PSA of 7.78 and is currently undergoing active treatment of daily radiation to the prostate.  ? ?Patient reports vomiting/nausea/diarrhea started approximately 1 week ago.  3-4 episodes daily.  Denies any fever, blood in stools, chills or night sweats.   ? ?RN reviewed with [employer], PA-C.  Verbal orders obtained for Zofran '8mg'$  Q 8 hours PRN for nausea and vomiting.   ? ?RN educated patient to follow a BRAT diet, drinks fluids with electrolytes until n/v/d resolves.  Pt agreeable if symptoms do not subside he will notify us.        ? ? ?

## 2021-05-24 ENCOUNTER — Ambulatory Visit
Admission: RE | Admit: 2021-05-24 | Discharge: 2021-05-24 | Disposition: A | Discharge status: Home / Self Care | Payer: Managed Care, Other (non HMO) | Source: Ambulatory Visit | Attending: Radiation Oncology | Admitting: Radiation Oncology

## 2021-05-24 ENCOUNTER — Other Ambulatory Visit: Payer: Self-pay

## 2021-05-24 DIAGNOSIS — Z510 Encounter for antineoplastic radiation therapy: Secondary | ICD-10-CM | POA: Diagnosis not present

## 2021-05-25 ENCOUNTER — Ambulatory Visit
Admission: RE | Admit: 2021-05-25 | Discharge: 2021-05-25 | Disposition: A | Discharge status: Home / Self Care | Payer: Managed Care, Other (non HMO) | Source: Ambulatory Visit | Attending: Radiation Oncology | Admitting: Radiation Oncology

## 2021-05-25 DIAGNOSIS — Z510 Encounter for antineoplastic radiation therapy: Secondary | ICD-10-CM | POA: Diagnosis not present

## 2021-05-26 ENCOUNTER — Ambulatory Visit
Admission: RE | Admit: 2021-05-26 | Discharge: 2021-05-26 | Disposition: A | Discharge status: Home / Self Care | Payer: Managed Care, Other (non HMO) | Source: Ambulatory Visit | Attending: Radiation Oncology | Admitting: Radiation Oncology

## 2021-05-26 ENCOUNTER — Other Ambulatory Visit: Payer: Self-pay

## 2021-05-26 DIAGNOSIS — Z510 Encounter for antineoplastic radiation therapy: Secondary | ICD-10-CM | POA: Diagnosis not present

## 2021-05-29 ENCOUNTER — Ambulatory Visit
Admission: RE | Admit: 2021-05-29 | Discharge: 2021-05-29 | Disposition: A | Discharge status: Home / Self Care | Payer: Managed Care, Other (non HMO) | Source: Ambulatory Visit | Attending: Radiation Oncology | Admitting: Radiation Oncology

## 2021-05-29 ENCOUNTER — Telehealth: Payer: Self-pay | Admitting: Cardiovascular Disease

## 2021-05-29 ENCOUNTER — Other Ambulatory Visit: Payer: Self-pay

## 2021-05-29 DIAGNOSIS — Z510 Encounter for antineoplastic radiation therapy: Secondary | ICD-10-CM | POA: Diagnosis not present

## 2021-05-29 NOTE — Progress Notes (Signed)
RN was notified that patient had recently been experiencing some hypotension.  Dr. Tammi Klippel reviewed with patient and recommended to ensure follow up with PCP and/or cardiologist.  ? ?RN spoke with patient today, he has been instructed by PCP to hold BP medications for this evening, and tomorrow AM.  Pt will follow up with PCP on 3/29.  RN educated patient on fluid intake, and safe ambulation/position with hypotension.  Pt verbalized understanding and agreement.   ?

## 2021-05-29 NOTE — Telephone Encounter (Signed)
Pt c/o BP issue: STAT if pt c/o blurred vision, one-sided weakness or slurred speech ? ?1. What are your last 5 BP readings? This morning 74/51  ? ?2. Are you having any other symptoms (ex. Dizziness, headache, blurred vision, passed out)? Little bit of dizziness if gets up fast or up for a while ? ?3. What is your BP issue? Patient states his BP has been very low and has some soreness in his neck area.  ?

## 2021-05-29 NOTE — Telephone Encounter (Signed)
Spoke with the patient who reports that his blood pressure has been low the past several days. He reports that it was first noted to be low on 3/23. States that it has been 70's/50s. He has been receiving radiation treatment this month and this will be his last week. He does state that last week he was sick to his stomach and was having diarrhea. He does report some lightheadedness at times. Patient is taking amlodipine 5 mg daily, benazapril 40 mg daily, and bisoprolol 10 mg daily. He is not sure which ones he takes in the AM vs. PM. I have advised him to eat a small salty snack and to make sure he is staying hydrated. He will check with his wife in regards to when he takes his BP meds. Advised to hold any BP meds tonight. If BP is still low in the AM continue to hold. Advised that I would send a message to Dr. Burt Knack for further advisement.  ?

## 2021-05-30 ENCOUNTER — Ambulatory Visit
Admission: RE | Admit: 2021-05-30 | Discharge: 2021-05-30 | Disposition: A | Discharge status: Home / Self Care | Payer: Managed Care, Other (non HMO) | Source: Ambulatory Visit | Attending: Radiation Oncology | Admitting: Radiation Oncology

## 2021-05-30 ENCOUNTER — Telehealth: Payer: Self-pay

## 2021-05-30 DIAGNOSIS — Z510 Encounter for antineoplastic radiation therapy: Secondary | ICD-10-CM | POA: Diagnosis not present

## 2021-05-30 NOTE — Telephone Encounter (Signed)
Patient called wanted to inform radiation team that he has an appointment on 05/31/2021 to see his heart doctor about his low BP and increased blood sugar readings.   ?

## 2021-05-31 ENCOUNTER — Ambulatory Visit
Admission: RE | Admit: 2021-05-31 | Discharge: 2021-05-31 | Disposition: A | Discharge status: Home / Self Care | Payer: Managed Care, Other (non HMO) | Source: Ambulatory Visit | Attending: Radiation Oncology | Admitting: Radiation Oncology

## 2021-05-31 ENCOUNTER — Encounter: Payer: Self-pay | Admitting: Internal Medicine

## 2021-05-31 ENCOUNTER — Ambulatory Visit (INDEPENDENT_AMBULATORY_CARE_PROVIDER_SITE_OTHER): Payer: Managed Care, Other (non HMO) | Admitting: Internal Medicine

## 2021-05-31 ENCOUNTER — Other Ambulatory Visit: Payer: Self-pay

## 2021-05-31 ENCOUNTER — Other Ambulatory Visit: Payer: Self-pay | Admitting: Internal Medicine

## 2021-05-31 VITALS — BP 90/54 | HR 82 | Temp 97.5°F | Ht 65.0 in | Wt 183.5 lb

## 2021-05-31 DIAGNOSIS — R944 Abnormal results of kidney function studies: Secondary | ICD-10-CM

## 2021-05-31 DIAGNOSIS — I959 Hypotension, unspecified: Secondary | ICD-10-CM | POA: Diagnosis not present

## 2021-05-31 DIAGNOSIS — E1151 Type 2 diabetes mellitus with diabetic peripheral angiopathy without gangrene: Secondary | ICD-10-CM

## 2021-05-31 DIAGNOSIS — C61 Malignant neoplasm of prostate: Secondary | ICD-10-CM | POA: Diagnosis not present

## 2021-05-31 DIAGNOSIS — Z510 Encounter for antineoplastic radiation therapy: Secondary | ICD-10-CM | POA: Diagnosis not present

## 2021-05-31 LAB — POCT GLYCOSYLATED HEMOGLOBIN (HGB A1C): Hemoglobin A1C: 5.5 % (ref 4.0–5.6)

## 2021-05-31 LAB — COMPREHENSIVE METABOLIC PANEL
ALT: 39 U/L (ref 0–53)
AST: 36 U/L (ref 0–37)
Albumin: 3.3 g/dL — ABNORMAL LOW (ref 3.5–5.2)
Alkaline Phosphatase: 58 U/L (ref 39–117)
BUN: 42 mg/dL — ABNORMAL HIGH (ref 6–23)
CO2: 24 mEq/L (ref 19–32)
Calcium: 9.2 mg/dL (ref 8.4–10.5)
Chloride: 97 mEq/L (ref 96–112)
Creatinine, Ser: 2.36 mg/dL — ABNORMAL HIGH (ref 0.40–1.50)
GFR: 27.56 mL/min — ABNORMAL LOW (ref >60.00)
Glucose, Bld: 131 mg/dL — ABNORMAL HIGH (ref 70–99)
Potassium: 3.8 mEq/L (ref 3.5–5.1)
Sodium: 132 mEq/L — ABNORMAL LOW (ref 135–145)
Total Bilirubin: 0.9 mg/dL (ref 0.2–1.2)
Total Protein: 6.1 g/dL (ref 6.0–8.3)

## 2021-05-31 LAB — CBC WITH DIFFERENTIAL/PLATELET
Basophils Absolute: 0 10*3/uL (ref 0.0–0.1)
Basophils Relative: 0.7 % (ref 0.0–3.0)
Eosinophils Absolute: 0.2 10*3/uL (ref 0.0–0.7)
Eosinophils Relative: 2.7 % (ref 0.0–5.0)
HCT: 36.2 % — ABNORMAL LOW (ref 39.0–52.0)
Hemoglobin: 12.4 g/dL — ABNORMAL LOW (ref 13.0–17.0)
Lymphocytes Relative: 4.3 % — ABNORMAL LOW (ref 12.0–46.0)
Lymphs Abs: 0.3 10*3/uL — ABNORMAL LOW (ref 0.7–4.0)
MCHC: 34.1 g/dL (ref 30.0–36.0)
MCV: 98.8 fl (ref 78.0–100.0)
Monocytes Absolute: 0.5 10*3/uL (ref 0.1–1.0)
Monocytes Relative: 7.1 % (ref 3.0–12.0)
Neutro Abs: 5.8 10*3/uL (ref 1.4–7.7)
Neutrophils Relative %: 84.7 % — ABNORMAL HIGH (ref 43.0–77.0)
Platelets: 239 10*3/uL (ref 150.0–400.0)
RBC: 3.67 Mil/uL — ABNORMAL LOW (ref 4.22–5.81)
RDW: 13.3 % (ref 11.5–15.5)
WBC: 6.8 10*3/uL (ref 4.0–10.5)

## 2021-05-31 NOTE — Progress Notes (Signed)
? ? ? ?Established Patient Office Visit ? ? ? ? ?This visit occurred during the SARS-CoV-2 public health emergency.  Safety protocols were in place, including screening questions prior to the visit, additional usage of staff PPE, and extensive cleaning of exam room while observing appropriate contact time as indicated for disinfecting solutions.  ? ? ?CC/Reason for Visit: Low blood pressure, follow-up chronic conditions ? ?HPI: Jose [name] is a 71 y.o. male who is coming in today for the above mentioned reasons. Past Medical History is significant for: Hypertension, hyperlipidemia, type 2 diabetes.  At his last physical in May he had an elevated PSA and was referred to urology who ultimately diagnosed him with prostate cancer.  He is currently undergoing radiation.  For the last couple weeks he has been having nausea, vomiting and diarrhea that he was told could happen with radiation.  Since March 24 he has been experiencing episodes of severe hypotension with systolic blood pressures in the 50s to 80s.  With this he has felt significant dizziness.  He was asked to follow-up with me today.  He already took his blood pressure medication this morning before he received a call from his cardiologist to hold off on taking them.  In office blood pressure today is 90/54.  He has not noticed any dark or bloody stools.  No significant cough, no urinary symptoms.  No fever, no recent travel, no sick contacts. ? ? ?Past Medical/Surgical History: ?Past Medical History:  ?Diagnosis Date  ? ALCOHOL ABUSE 07/27/2008  ? Allergy   ? ANXIETY 07/27/2008  ? Arthritis   ? Bifascicular block   ? beta blocker stopped due to profound bradycardia  ? CAD 07/27/2008  ? a. s/p Endeavor DES to CFX 07/2008; residual OM1 70%, EF 45%;  b. relook cath 5/10: patent stent in CFX;  c.  LHC (8/15):  prox LAD 30%, OM1 50% at bifurcation, OM1 sub-branch 50%, mid AVCFX stent patent, EF 55-65% - no change from 2010 >>> Med Rx (after abnormal nuc)   ?  Cataract   ? Diabetes mellitus without complication ([place])   ? GERD 09/18/2006  ? History of TIA (transient ischemic attack) may 2010  ? Hx of adenomatous colonic polyps   ? Hx of cardiovascular stress test   ? ETT-Myoview (09/2013):  Partially fixed defect with some reversibility - cannot r/o inf ischemia vs diaph atten, EF 51%; mod risk  ? Hyperlipidemia   ? HYPERTENSION 09/18/2006  ? LOW BACK PAIN 09/18/2006  ? Myocardial infarction [facility]) 2010  ? Numbness and tingling   ? finger tips at times  ? Rosacea 07/27/2008  ? SEIZURE DISORDER 09/18/2006  ? none since 2008  ? 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  ? Urinary tract infection start cipro 01-07-2012  ? ? ?Past Surgical History:  ?Procedure Laterality Date  ? CORONARY STENT PLACEMENT    ? drug eluting  ? GOLD SEED IMPLANT N/A 04/11/2021  ? Procedure: GOLD SEED IMPLANT SPACE OAR;  Surgeon: Robley Fries, MD;  Location: [facility];  Service: Urology;  Laterality: N/A;  ? HERNIA REPAIR    ? INGUINAL HERNIA REPAIR  01/14/2012  ? Procedure: LAPAROSCOPIC INGUINAL HERNIA;  Surgeon: Ralene Ok, MD;  Location: [facility];  Service: General;  Laterality: Right;  ? INSERTION OF MESH  01/14/2012  ? Procedure: INSERTION OF MESH;  Surgeon: Ralene Ok, MD;  Location: [facility];  Service: General;  Laterality: Right;  ? KNEE SURGERY    ? LEFT HEART CATHETERIZATION WITH  CORONARY ANGIOGRAM N/A 10/21/2013  ? Procedure: LEFT HEART CATHETERIZATION WITH CORONARY ANGIOGRAM;  Surgeon: Blane Ohara, MD;  Location: [facility] CATH LAB;  Service: Cardiovascular;  Laterality: N/A;  ? MEDIAL PARTIAL KNEE REPLACEMENT Left   ? ? ?Social History: ? reports that he has never smoked. He quit smokeless tobacco use about 9 years ago.  His smokeless tobacco use included chew. He reports that he does not drink alcohol and does not use drugs. ? ?Allergies: ?No Known Allergies ? ?Family History:  ?Family History  ?Problem Relation Age of Onset  ? Lung cancer Mother   ? Hypertension Mother   ? Diabetes  Mother   ? Hyperlipidemia Mother   ? Emphysema Father   ? Heart failure Father   ? Melanoma Sister   ? Melanoma Paternal Grandmother   ? Heart attack Other   ? Colon cancer Neg Hx   ? Stroke Neg Hx   ? Esophageal cancer Neg Hx   ? Liver cancer Neg Hx   ? Pancreatic cancer Neg Hx   ? Stomach cancer Neg Hx   ? Rectal cancer Neg Hx   ? ? ? ?Current Outpatient Medications:  ?  ALPRAZolam (XANAX) 0.5 MG tablet, TAKE 1 TABLET BY MOUTH THREE TIMES A DAY AS NEEDED, Disp: 60 tablet, Rfl: 0 ?  amLODipine (NORVASC) 5 MG tablet, TAKE 1 TABLET (5 MG TOTAL) BY MOUTH DAILY., Disp: 90 tablet, Rfl: 1 ?  atorvastatin (LIPITOR) 20 MG tablet, Take 1 tablet (20 mg total) by mouth daily., Disp: 90 tablet, Rfl: 1 ?  Azelaic Acid 15 % cream, APPLY 1 APPLICATION TOPICALLY AS DIRECTED. AFTER SKIN IS THOROUGHLY WASHED AND PATTED DRY, GENTLY BUT THOROUGHLY MASSAGE A THIN FILM OF AZELAIC ACID INTO THE AFFECTED AREA TWICE DAILY, IN THE MORNING AND EVENING. APPLIED TO FACE., Disp: 50 g, Rfl: 0 ?  BAYER LOW DOSE 81 MG chewable tablet, Chew 81 mg by mouth daily., Disp: , Rfl:  ?  benazepril (LOTENSIN) 40 MG tablet, TAKE 1 TABLET BY MOUTH EVERY DAY, Disp: 90 tablet, Rfl: 1 ?  bisoprolol (ZEBETA) 10 MG tablet, Take 1 tablet (10 mg total) by mouth daily., Disp: 90 tablet, Rfl: 3 ?  cholecalciferol (VITAMIN D) 25 MCG (1000 UNIT) tablet, Take 1,000 Units by mouth daily., Disp: , Rfl:  ?  diclofenac sodium (VOLTAREN) 1 % GEL, Apply 2 g topically 2 (two) times daily as needed (joint pain (knees)). , Disp: , Rfl:  ?  halobetasol (ULTRAVATE) 0.05 % cream, Apply topically 2 (two) times daily., Disp: , Rfl:  ?  Lancets (ONETOUCH DELICA PLUS [id number]) MISC, TEST WITH 1 LANCET PER USE, USE AS DIRECTED (MAX 30DAYS), Disp: 100 each, Rfl: 0 ?  metFORMIN (GLUCOPHAGE) 1000 MG tablet, TAKE 1 TABLET (1,000 MG TOTAL) BY MOUTH 2 (TWO) TIMES DAILY WITH A MEAL., Disp: 180 tablet, Rfl: 1 ?  Multiple Vitamin (MULTIVITAMIN WITH MINERALS) TABS, Take 1 tablet by mouth daily.,  Disp: , Rfl:  ?  nitroGLYCERIN (NITROSTAT) 0.4 MG SL tablet, DISSOLVE 1 TABLET BY MOUTH UNDER THE TONGUE EVERY 5 MINUTES AS NEEDED FOR CHEST PAIN, Disp: 75 tablet, Rfl: 5 ?  ondansetron (ZOFRAN) 8 MG tablet, Take 1 tablet (8 mg total) by mouth every 8 (eight) hours as needed for nausea or vomiting., Disp: 20 tablet, Rfl: 0 ?  ONETOUCH VERIO test strip, USE TO TEST BLOOD SUGAR ONCE A DAY DX E11.51 -, Disp: 50 strip, Rfl: 1 ?  vitamin C (ASCORBIC ACID) 500 MG tablet, Take  500 mg by mouth daily. Reported on 08/23/2015, Disp: , Rfl:  ? ?Review of Systems:  ?Constitutional: Denies fever, chills, diaphoresis. ?HEENT: Denies photophobia, eye pain, redness, hearing loss, ear pain, congestion, sore throat, rhinorrhea, sneezing, mouth sores, trouble swallowing, neck pain, neck stiffness and tinnitus.   ?Respiratory: Denies SOB, DOE, cough, chest tightness,  and wheezing.   ?Cardiovascular: Denies chest pain, palpitations and leg swelling.  ?Gastrointestinal: Denies diarrhea, constipation, blood in stool and abdominal distention.  ?Genitourinary: Denies dysuria, urgency, frequency, hematuria, flank pain and difficulty urinating.  ?Endocrine: Denies: hot or cold intolerance, sweats, changes in hair or nails, polyuria, polydipsia. ?Musculoskeletal: Denies myalgias, back pain, joint swelling, arthralgias and gait problem.  ?Skin: Denies pallor, rash and wound.  ?Neurological: Denies dizziness, seizures, syncope, weakness, light-headedness, numbness and headaches.  ?Hematological: Denies adenopathy. Easy bruising, personal or family bleeding history  ?Psychiatric/Behavioral: Denies suicidal ideation, mood changes, confusion, nervousness, sleep disturbance and agitation ? ? ? ?Physical Exam: ?Vitals:  ? 05/31/21 0914  ?BP: (!) 90/54  ?Pulse: 82  ?Temp: (!) 97.5 ?F (36.4 ?C)  ?TempSrc: Oral  ?SpO2: 97%  ?Weight: 183 lb 8 oz (83.2 kg)  ?Height: '5\' 5"'$  (1.651 m)  ? ? ?Body mass index is 30.54 kg/m?. ? ? ?Constitutional: NAD, calm,  comfortable ?Eyes: PERRL, lids and conjunctivae normal ?ENMT: Mucous membranes are moist.  ?Cardiovascular: Regular rate and rhythm, no murmurs / rubs / gallops. No extremity edema.  ?Neurologic: Grossly intact a

## 2021-05-31 NOTE — Telephone Encounter (Signed)
Agree. That BP is really low. Needs to hold all BP meds you listed until BP > 138mHg. If BP remains low off of BP meds needs to seek medical attention. thanks ?

## 2021-05-31 NOTE — Telephone Encounter (Signed)
Called and spoke directly with patient this morning while he is on the way to see his PCP for scheduled appt. Pt understands not to take any BP medication until his systolic pressure is consistently 172mhg or greater and will add back in slowly, rather than taking all 3 of his medications at once. Pt requested that we send him a message on MyChart listing his BP medications, will do that now. States that his pressure was better yesterday and this morning. Unable to provide exact numbers as he was driving, but he stated that his reading this morning "was about 89/60-something, but it's slowly getting better." ?

## 2021-05-31 NOTE — Patient Instructions (Addendum)
-  Nice seeing you today!! ? ?-Lab work today; will notify you once results are available. ? ?-Hold all blood pressure medication. ? ?-If BP continues to be low despite holding meds and /or dizziness increases, please go to ED for evaluation. ?

## 2021-05-31 NOTE — Progress Notes (Signed)
As expected, significant worsening in kidney function due to vomiting/diarrhea and dehydration. Needs to drink Copious amounts of oral fluids and repeat BMP in 1 week to monitor for improvement. If he continues to vomit and is unable to keep up with oral intake, would advise ED evaluation sooner rather than later.Hb is relatively stable.  ? ?To be clear after visit today: he is holding amlodipine and benazepril correct?

## 2021-06-01 ENCOUNTER — Ambulatory Visit
Admission: RE | Admit: 2021-06-01 | Discharge: 2021-06-01 | Disposition: A | Discharge status: Home / Self Care | Payer: Managed Care, Other (non HMO) | Source: Ambulatory Visit | Attending: Radiation Oncology | Admitting: Radiation Oncology

## 2021-06-01 ENCOUNTER — Ambulatory Visit: Payer: Managed Care, Other (non HMO)

## 2021-06-01 DIAGNOSIS — Z510 Encounter for antineoplastic radiation therapy: Secondary | ICD-10-CM | POA: Diagnosis not present

## 2021-06-02 ENCOUNTER — Other Ambulatory Visit: Payer: Self-pay

## 2021-06-02 ENCOUNTER — Encounter: Payer: Self-pay | Admitting: Urology

## 2021-06-02 ENCOUNTER — Ambulatory Visit
Admission: RE | Admit: 2021-06-02 | Discharge: 2021-06-02 | Disposition: A | Discharge status: Home / Self Care | Payer: Managed Care, Other (non HMO) | Source: Ambulatory Visit | Attending: Radiation Oncology | Admitting: Radiation Oncology

## 2021-06-02 DIAGNOSIS — C61 Malignant neoplasm of prostate: Secondary | ICD-10-CM

## 2021-06-02 DIAGNOSIS — Z510 Encounter for antineoplastic radiation therapy: Secondary | ICD-10-CM | POA: Diagnosis not present

## 2021-06-04 ENCOUNTER — Emergency Department (HOSPITAL_COMMUNITY)
Admission: EM | Admit: 2021-06-04 | Discharge: 2021-06-04 | Disposition: A | Discharge status: Home / Self Care | Payer: Managed Care, Other (non HMO) | Attending: Emergency Medicine | Admitting: Emergency Medicine

## 2021-06-04 ENCOUNTER — Encounter (HOSPITAL_COMMUNITY): Payer: Self-pay | Admitting: Radiology

## 2021-06-04 ENCOUNTER — Other Ambulatory Visit: Payer: Self-pay

## 2021-06-04 ENCOUNTER — Emergency Department (HOSPITAL_COMMUNITY): Payer: Managed Care, Other (non HMO)

## 2021-06-04 DIAGNOSIS — Z7984 Long term (current) use of oral hypoglycemic drugs: Secondary | ICD-10-CM | POA: Diagnosis not present

## 2021-06-04 DIAGNOSIS — R300 Dysuria: Secondary | ICD-10-CM | POA: Diagnosis present

## 2021-06-04 DIAGNOSIS — I10 Essential (primary) hypertension: Secondary | ICD-10-CM | POA: Diagnosis not present

## 2021-06-04 DIAGNOSIS — N3000 Acute cystitis without hematuria: Secondary | ICD-10-CM | POA: Diagnosis not present

## 2021-06-04 DIAGNOSIS — Z79899 Other long term (current) drug therapy: Secondary | ICD-10-CM | POA: Insufficient documentation

## 2021-06-04 DIAGNOSIS — E119 Type 2 diabetes mellitus without complications: Secondary | ICD-10-CM | POA: Insufficient documentation

## 2021-06-04 DIAGNOSIS — R000 Tachycardia, unspecified: Secondary | ICD-10-CM | POA: Insufficient documentation

## 2021-06-04 DIAGNOSIS — Z8546 Personal history of malignant neoplasm of prostate: Secondary | ICD-10-CM | POA: Insufficient documentation

## 2021-06-04 DIAGNOSIS — E860 Dehydration: Secondary | ICD-10-CM | POA: Insufficient documentation

## 2021-06-04 DIAGNOSIS — Z7982 Long term (current) use of aspirin: Secondary | ICD-10-CM | POA: Diagnosis not present

## 2021-06-04 DIAGNOSIS — N3040 Irradiation cystitis without hematuria: Secondary | ICD-10-CM

## 2021-06-04 LAB — CBC WITH DIFFERENTIAL/PLATELET
Abs Immature Granulocytes: 0.05 10*3/uL (ref 0.00–0.07)
Basophils Absolute: 0 10*3/uL (ref 0.0–0.1)
Basophils Relative: 0 %
Eosinophils Absolute: 0.1 10*3/uL (ref 0.0–0.5)
Eosinophils Relative: 1 %
HCT: 34.3 % — ABNORMAL LOW (ref 39.0–52.0)
Hemoglobin: 11.8 g/dL — ABNORMAL LOW (ref 13.0–17.0)
Immature Granulocytes: 1 %
Lymphocytes Relative: 5 %
Lymphs Abs: 0.4 10*3/uL — ABNORMAL LOW (ref 0.7–4.0)
MCH: 34 pg (ref 26.0–34.0)
MCHC: 34.4 g/dL (ref 30.0–36.0)
MCV: 98.8 fL (ref 80.0–100.0)
Monocytes Absolute: 0.4 10*3/uL (ref 0.1–1.0)
Monocytes Relative: 6 %
Neutro Abs: 6.4 10*3/uL (ref 1.7–7.7)
Neutrophils Relative %: 87 %
Platelets: 234 10*3/uL (ref 150–400)
RBC: 3.47 MIL/uL — ABNORMAL LOW (ref 4.22–5.81)
RDW: 12.7 % (ref 11.5–15.5)
WBC: 7.3 10*3/uL (ref 4.0–10.5)
nRBC: 0 % (ref 0.0–0.2)

## 2021-06-04 LAB — COMPREHENSIVE METABOLIC PANEL
ALT: 29 U/L (ref 0–44)
AST: 21 U/L (ref 15–41)
Albumin: 2.8 g/dL — ABNORMAL LOW (ref 3.5–5.0)
Alkaline Phosphatase: 51 U/L (ref 38–126)
Anion gap: 8 (ref 5–15)
BUN: 13 mg/dL (ref 8–23)
CO2: 26 mmol/L (ref 22–32)
Calcium: 8.9 mg/dL (ref 8.9–10.3)
Chloride: 101 mmol/L (ref 98–111)
Creatinine, Ser: 1.07 mg/dL (ref 0.61–1.24)
GFR, Estimated: >60 mL/min (ref >60)
Glucose, Bld: 151 mg/dL — ABNORMAL HIGH (ref 70–99)
Potassium: 3.6 mmol/L (ref 3.5–5.1)
Sodium: 135 mmol/L (ref 135–145)
Total Bilirubin: 0.9 mg/dL (ref 0.3–1.2)
Total Protein: 6 g/dL — ABNORMAL LOW (ref 6.5–8.1)

## 2021-06-04 LAB — URINALYSIS, ROUTINE W REFLEX MICROSCOPIC

## 2021-06-04 LAB — URINALYSIS, MICROSCOPIC (REFLEX): Squamous Epithelial / HPF: NONE SEEN (ref 0–5)

## 2021-06-04 LAB — TROPONIN I (HIGH SENSITIVITY): Troponin I (High Sensitivity): 4 ng/L (ref <18)

## 2021-06-04 LAB — CBG MONITORING, ED: Glucose-Capillary: 153 mg/dL — ABNORMAL HIGH (ref 70–99)

## 2021-06-04 LAB — PROTIME-INR
INR: 1.1 (ref 0.8–1.2)
Prothrombin Time: 14 seconds (ref 11.4–15.2)

## 2021-06-04 MED ORDER — LACTATED RINGERS IV BOLUS
1000.0000 mL | Freq: Once | INTRAVENOUS | Status: AC
  Administered 2021-06-04: 1000 mL via INTRAVENOUS

## 2021-06-04 MED ORDER — BISOPROLOL FUMARATE 5 MG PO TABS
5.0000 mg | ORAL_TABLET | Freq: Once | ORAL | Status: AC
Start: 2021-06-04 — End: 2021-06-04
  Administered 2021-06-04: 5 mg via ORAL
  Filled 2021-06-04: qty 1

## 2021-06-04 MED ORDER — HYDROMORPHONE HCL 1 MG/ML IJ SOLN
0.5000 mg | Freq: Once | INTRAMUSCULAR | Status: DC
Start: 2021-06-04 — End: 2021-06-04
  Filled 2021-06-04: qty 1

## 2021-06-04 NOTE — ED Triage Notes (Signed)
Pt states he did his last cancer treatment Friday for prostate cancer and since he has had severe pain and frequency with urination. Pt has been taking AZO for the symptoms. ?

## 2021-06-04 NOTE — ED Provider Notes (Signed)
?[facility] DEPT ?Provider Note ? ? ?CSN: [id number] ?Arrival date & time: 06/04/21  1337 ? ?  ? ?History ? ?Chief Complaint  ?Patient presents with  ? Urinary Frequency  ? painful urination  ? ? ?Jose [name] is a 71 y.o. male. ? ?HPI ?Patient has history of prostate cancer.  He just completed patient therapy has been under treatment for 28 days.  Patient reports for the past 5 days he has started to get some significant difficulty urinating.  He is having severe pain and dysuria.  Patient reports he is putting out only very small amounts of urine.  He reports his urine is bright orange but he has been taking Azo as recommended by his urologist.  He has not had fevers or chills.  Reports he has felt somewhat generally weak he has continued to eat and drink but maybe not as much as he should.  He reports he has not had any active vomiting.  He reports his stool is loose but not significantly diarrheal.  No fever.  He was having problems with hypotension and suspected dehydration.  See copy of note from PCP. ?  ?3/39/2023 Past Medical History is significant for: Hypertension, hyperlipidemia, type 2 diabetes.  At his last physical in May he had an elevated PSA and was referred to urology who ultimately diagnosed him with prostate cancer.  He is currently undergoing radiation.  For the last couple weeks he has been having nausea, vomiting and diarrhea that he was told could happen with radiation.  Since March 24 he has been experiencing episodes of severe hypotension with systolic blood pressures in the 50s to 80s. He already took his blood pressure medication this morning before he received a call from his cardiologist to hold off on taking them.  In office blood pressure today is 90/54. ?Home Medications ?Prior to Admission medications   ?Medication Sig Start Date End Date Taking? Authorizing Provider  ?ALPRAZolam (XANAX) 0.5 MG tablet TAKE 1 TABLET BY MOUTH THREE TIMES A DAY AS  NEEDED ?Patient taking differently: Take 0.5 mg by mouth 3 (three) times daily as needed for anxiety. 02/01/21  Yes [name], [name], MD  ?aspirin EC 81 MG tablet Take 81 mg by mouth at bedtime. Swallow whole.   Yes [provider]  ?atorvastatin (LIPITOR) 20 MG tablet Take 1 tablet (20 mg total) by mouth daily. 03/21/21  Yes [name], [name], MD  ?Azelaic Acid 15 % cream APPLY 1 APPLICATION TOPICALLY AS DIRECTED. AFTER SKIN IS THOROUGHLY WASHED AND PATTED DRY, GENTLY BUT THOROUGHLY MASSAGE A THIN FILM OF AZELAIC ACID INTO THE AFFECTED AREA TWICE DAILY, IN THE MORNING AND EVENING. APPLIED TO FACE. ?Patient taking differently: 1 application. daily as needed. 10/22/18  Yes [name], [name], MD  ?cholecalciferol (VITAMIN D) 25 MCG (1000 UNIT) tablet Take 1,000 Units by mouth daily.   Yes [provider]  ?diclofenac sodium (VOLTAREN) 1 % GEL Apply 2 g topically 2 (two) times daily as needed (joint pain (knees)).    Yes [provider]  ?halobetasol (ULTRAVATE) 0.05 % cream Apply 1 application. topically 2 (two) times daily as needed (dry skin).   Yes [provider]  ?metFORMIN (GLUCOPHAGE) 1000 MG tablet TAKE 1 TABLET (1,000 MG TOTAL) BY MOUTH 2 (TWO) TIMES DAILY WITH A MEAL. 02/01/21  Yes [name], [name], MD  ?Multiple Vitamin (MULTIVITAMIN WITH MINERALS) TABS Take 1 tablet by mouth every evening.   Yes [provider]  ?nitroGLYCERIN (NITROSTAT) 0.4 MG SL tablet DISSOLVE 1 TABLET BY MOUTH UNDER THE TONGUE EVERY 5 MINUTES AS NEEDED FOR CHEST PAIN 02/08/17  Yes Bensimhon, Shaune Pascal, MD  ?ondansetron (ZOFRAN) 8 MG tablet Take 1 tablet (8 mg total) by mouth every 8 (eight) hours as needed for nausea or vomiting. 05/23/21  Yes Bruning, Ashlyn, PA-C  ?tamsulosin (FLOMAX) 0.4 MG CAPS capsule Take 0.4 mg by mouth every evening. 05/26/21  Yes [provider]  ?vitamin C (ASCORBIC ACID) 500 MG tablet Take 500 mg by mouth daily. Reported  on 08/23/2015   Yes [provider]  ?amLODipine (NORVASC) 5 MG tablet TAKE 1 TABLET (5 MG TOTAL) BY MOUTH DAILY. ?Patient not taking: Reported on 06/04/2021 04/06/21   [name], [name], MD  ?benazepril (LOTENSIN) 40 MG tablet TAKE 1 TABLET BY MOUTH EVERY DAY ?Patient not taking: Reported on 06/04/2021 01/04/21   [name], [name], MD  ?bisoprolol (ZEBETA) 10 MG tablet Take 1 tablet (10 mg total) by mouth daily. ?Patient not taking: Reported on 06/04/2021 07/12/20   [name], MD  ?Lancets [facility] DELICA PLUS [id number]) [place] TEST WITH 1 LANCET PER USE, USE AS DIRECTED (MAX 30DAYS) 04/07/21   [name], [name], MD  ?[facility] VERIO test strip USE TO TEST BLOOD SUGAR ONCE A DAY DX E11.51 - 04/12/21   Caren Macadam, MD  ?   ? ?Allergies    ?Patient has no known allergies.   ? ?Review of Systems   ?Review of Systems ?10 systems reviewed and negative except as per HPI ?Physical Exam ?Updated Vital Signs ?BP 137/82   Pulse 100   Temp 98.8 ?F (37.1 ?C) (Oral)   Resp 17   Ht '5\' 5"'$  (1.651 m)   Wt 78.9 kg   SpO2 94%   BMI 28.96 kg/m?  ?Physical Exam ?Constitutional:   ?   Comments: Alert nontoxic clinically well in appearance.  No respiratory distress.  ?HENT:  ?   Mouth/Throat:  ?   Mouth: Mucous membranes are moist.  ?   Pharynx: Oropharynx is clear.  ?Eyes:  ?   Extraocular Movements: Extraocular movements intact.  ?Cardiovascular:  ?   Rate and Rhythm: Regular rhythm. Tachycardia present.  ?Pulmonary:  ?   Effort: Pulmonary effort is normal.  ?   Breath sounds: Normal breath sounds.  ?Abdominal:  ?   General: There is no distension.  ?   Palpations: Abdomen is soft.  ?   Tenderness: There is no abdominal tenderness. There is no guarding.  ?Musculoskeletal:     ?   General: No swelling or tenderness. Normal range of motion.  ?   Right lower leg: No edema.  ?   Left lower leg: No edema.  ?Skin: ?   General: Skin is warm and dry.  ?Neurological:  ?   General: No focal deficit  present.  ?   Mental Status: He is oriented to person, place, and time.  ?   Coordination: Coordination normal.  ?Psychiatric:     ?   Mood and Affect: Mood normal.  ? ? ?ED Results / Procedures / Treatments   ?Labs ?(all labs ordered are listed, but only abnormal results are displayed) ?Labs Reviewed  ?COMPREHENSIVE METABOLIC PANEL - Abnormal; Notable for the following components:  ?    Result Value  ? Glucose, Bld 151 (*)   ? Total Protein 6.0 (*)   ? Albumin 2.8 (*)   ? All other components within normal  limits  ?CBC WITH DIFFERENTIAL/PLATELET - Abnormal; Notable for the following components:  ? RBC 3.47 (*)   ? Hemoglobin 11.8 (*)   ? HCT 34.3 (*)   ? Lymphs Abs 0.4 (*)   ? All other components within normal limits  ?URINALYSIS, ROUTINE W REFLEX MICROSCOPIC - Abnormal; Notable for the following components:  ? Color, Urine ORANGE (*)   ? Glucose, UA   (*)   ? Value: TEST NOT REPORTED DUE TO COLOR INTERFERENCE OF URINE PIGMENT  ? Hgb urine dipstick   (*)   ? Value: TEST NOT REPORTED DUE TO COLOR INTERFERENCE OF URINE PIGMENT  ? Bilirubin Urine   (*)   ? Value: TEST NOT REPORTED DUE TO COLOR INTERFERENCE OF URINE PIGMENT  ? Ketones, ur   (*)   ? Value: TEST NOT REPORTED DUE TO COLOR INTERFERENCE OF URINE PIGMENT  ? Protein, ur   (*)   ? Value: TEST NOT REPORTED DUE TO COLOR INTERFERENCE OF URINE PIGMENT  ? Nitrite   (*)   ? Value: TEST NOT REPORTED DUE TO COLOR INTERFERENCE OF URINE PIGMENT  ? Leukocytes,Ua   (*)   ? Value: TEST NOT REPORTED DUE TO COLOR INTERFERENCE OF URINE PIGMENT  ? All other components within normal limits  ?URINALYSIS, MICROSCOPIC (REFLEX) - Abnormal; Notable for the following components:  ? Bacteria, UA RARE (*)   ? All other components within normal limits  ?CBG MONITORING, ED - Abnormal; Notable for the following components:  ? Glucose-Capillary 153 (*)   ? All other components within normal limits  ?URINE CULTURE  ?PROTIME-INR  ?TROPONIN I (HIGH SENSITIVITY)  ? ? ?EKG ?EKG  Interpretation ? ?Date/Time:  Sunday June 04 2021 13:50:46 EDT ?Ventricular Rate:  140 ?PR Interval:  98 ?QRS Duration: 151 ?QT Interval:  391 ?QTC Calculation: 597 ?R Axis:   -54 ?Text Interpretation: Sinus tachycardia RBBB and LAFB IVCD

## 2021-06-04 NOTE — Discharge Instructions (Signed)
1.  Urine culture is being done.  There should be results in 24 to 48 hours.  At that time will be determine if you need antibiotics. ?2.  Try to stay well-hydrated.  This will help flush your bladder. ?3.  Restart your bisoprolol.  Your heart rate was elevated in the emergency department up to 150 bpm.  You are not having any symptoms with this.  This is likely a combination of dehydration and having recently stopped the bisoprolol.  Monitor your heart rate and blood pressure 3-4 times a day for the next several days.  Return to the emergency department if your blood pressure is low (less than 100/60 ) or heart rate is elevated (staying greater than 110s).  Your cardiologist on Monday to let them know you are seen in the emergency department and if your blood pressure and heart rate tolerate this the recommendation is to restart your bisoprolol.  Do not restart your other blood pressure medications at this time. ?4.  Call [facility] tomorrow to set up an appointment this week for recheck. ?5.  Return to the emergency department if you have any new worsening or concerning symptoms especially fever, abdominal pain, back pain, vomiting, lightheadedness, chest pain, shortness of breath or other concerning symptoms. ?

## 2021-06-04 NOTE — ED Notes (Signed)
Bladder scan done 3x by NT and RN, 0 mL showing each time.  ?

## 2021-06-06 LAB — URINE CULTURE: Culture: NO GROWTH

## 2021-06-07 ENCOUNTER — Other Ambulatory Visit (INDEPENDENT_AMBULATORY_CARE_PROVIDER_SITE_OTHER): Payer: Managed Care, Other (non HMO)

## 2021-06-07 DIAGNOSIS — R944 Abnormal results of kidney function studies: Secondary | ICD-10-CM

## 2021-06-07 LAB — BASIC METABOLIC PANEL
BUN: 10 mg/dL (ref 6–23)
CO2: 27 mEq/L (ref 19–32)
Calcium: 9.2 mg/dL (ref 8.4–10.5)
Chloride: 104 mEq/L (ref 96–112)
Creatinine, Ser: 1 mg/dL (ref 0.40–1.50)
GFR: 77.22 mL/min (ref >60.00)
Glucose, Bld: 89 mg/dL (ref 70–99)
Potassium: 4.2 mEq/L (ref 3.5–5.1)
Sodium: 139 mEq/L (ref 135–145)

## 2021-07-03 ENCOUNTER — Other Ambulatory Visit: Payer: Self-pay | Admitting: Cardiovascular Disease

## 2021-07-04 NOTE — Telephone Encounter (Signed)
Left message for the patient to contact the office for schedule a follow up appointment.  ?

## 2021-07-05 ENCOUNTER — Telehealth: Payer: Self-pay

## 2021-07-05 ENCOUNTER — Encounter: Payer: Self-pay | Admitting: Urology

## 2021-07-05 ENCOUNTER — Other Ambulatory Visit: Payer: Self-pay | Admitting: Internal Medicine

## 2021-07-05 NOTE — Telephone Encounter (Signed)
Telephone appointment reminder. I left a voicemail reminding patient of their 10:30am-07/06/21 telephone appointment w/ Ashlyn Bruning PA-C. I left my extension [phone] and requested that patient return my call so that I may complete the nursing portion of this appointment. ?

## 2021-07-05 NOTE — Progress Notes (Signed)
Telephone appointment. I verified patient identity and began nursing interview. Patient reports doing well. No issues reported at this time. ? ?Meaningful use complete. ?Flomax 0.'4mg'$  as directed. ?Urology appointment- None- Per patient  ?I-PSS score of 3 (mild). ? ?Reminded patient of their 10:30am-07/06/21 telephone appointment w/ Ashlyn Bruning PA-C. I left my extension [phone] in case patient needs anything. Patient verbalized understanding of information. ? ?Patient contact [phone] ?

## 2021-07-06 ENCOUNTER — Ambulatory Visit
Admission: RE | Admit: 2021-07-06 | Discharge: 2021-07-06 | Disposition: A | Discharge status: Home / Self Care | Payer: BLUE CROSS/BLUE SHIELD | Source: Ambulatory Visit | Attending: Radiation Oncology | Admitting: Radiation Oncology

## 2021-07-06 DIAGNOSIS — C61 Malignant neoplasm of prostate: Secondary | ICD-10-CM | POA: Insufficient documentation

## 2021-07-06 NOTE — Progress Notes (Signed)
?Radiation Oncology         [phone] ?________________________________ ? ?Name: Jose [name] MRN: [id number]  ?Date: 07/06/2021  DOB: [birth year] ? ?Post Treatment Note ? ?CC: [name], [name], MD  [name], [name] ? ?Diagnosis:   72 y.o. gentleman with stage T1c adenocarcinoma of the prostate with a Gleason's score of 3+4 and a PSA of 7.78    ? ?Interval Since Last Radiation:  5 weeks  ?04/25/21 - 06/02/21:  The prostate was treated to 70 Gy in 28 fractions of 2.5 Gy ? ?Narrative:  I spoke with the patient to conduct his routine scheduled 1 month follow up visit via telephone to spare the patient unnecessary potential exposure in the healthcare setting during the current COVID-19 pandemic.  The patient was notified in advance and gave permission to proceed with this visit format. ? ?He tolerated radiation treatment relatively well with only minor urinary irritation and modest fatigue.  He did report a weaker flow of stream, frequency, urgency and nocturia 5-6 times per night despite taking Flomax daily as prescribed.  He also had some dysuria which improved with AZO as needed.  He experienced some loose stools/diarrhea as well as occasional nausea but denied abdominal pain.                             ? ?On review of systems, the patient states that he is doing well in general.  He did have some fairly significant postradiation cystitis immediately after completing radiation treatment but this has gradually improved and is now much more tolerable.  He is taking Flomax twice daily as prescribed and only uses AZO as needed on rare occasion at this point.  He specifically denies gross hematuria, straining to void or incontinence.  He continues with more frequent, soft bowel movements but denies abdominal pain, nausea, vomiting, diarrhea or constipation.  His appetite is good and he is maintaining his weight.  His energy level is gradually improving and overall, he is pleased with his progress to  date. ? ?ALLERGIES:  has No Known Allergies. ? ?Meds: ?Current Outpatient Medications  ?Medication Sig Dispense Refill  ? ALPRAZolam (XANAX) 0.5 MG tablet TAKE 1 TABLET BY MOUTH THREE TIMES A DAY AS NEEDED (Patient taking differently: Take 0.5 mg by mouth 3 (three) times daily as needed for anxiety.) 60 tablet 0  ? amLODipine (NORVASC) 5 MG tablet TAKE 1 TABLET (5 MG TOTAL) BY MOUTH DAILY. (Patient not taking: Reported on 06/04/2021) 90 tablet 1  ? aspirin EC 81 MG tablet Take 81 mg by mouth at bedtime. Swallow whole.    ? atorvastatin (LIPITOR) 20 MG tablet Take 1 tablet (20 mg total) by mouth daily. 90 tablet 1  ? Azelaic Acid 15 % cream APPLY 1 APPLICATION TOPICALLY AS DIRECTED. AFTER SKIN IS THOROUGHLY WASHED AND PATTED DRY, GENTLY BUT THOROUGHLY MASSAGE A THIN FILM OF AZELAIC ACID INTO THE AFFECTED AREA TWICE DAILY, IN THE MORNING AND EVENING. APPLIED TO FACE. (Patient taking differently: 1 application. daily as needed.) 50 g 0  ? benazepril (LOTENSIN) 40 MG tablet TAKE 1 TABLET BY MOUTH EVERY DAY 90 tablet 1  ? bisoprolol (ZEBETA) 10 MG tablet Take 1 tablet (10 mg total) by mouth daily. (Patient not taking: Reported on 06/04/2021) 90 tablet 3  ? cholecalciferol (VITAMIN D) 25 MCG (1000 UNIT) tablet Take 1,000 Units by mouth daily.    ? diclofenac sodium (VOLTAREN) 1 % GEL Apply  2 g topically 2 (two) times daily as needed (joint pain (knees)).     ? halobetasol (ULTRAVATE) 0.05 % cream Apply 1 application. topically 2 (two) times daily as needed (dry skin).    ? Lancets (ONETOUCH DELICA PLUS [id number]) MISC TEST WITH 1 LANCET PER USE, USE AS DIRECTED (MAX 30DAYS) 100 each 0  ? metFORMIN (GLUCOPHAGE) 1000 MG tablet TAKE 1 TABLET (1,000 MG TOTAL) BY MOUTH 2 (TWO) TIMES DAILY WITH A MEAL. 180 tablet 1  ? Multiple Vitamin (MULTIVITAMIN WITH MINERALS) TABS Take 1 tablet by mouth every evening.    ? nitroGLYCERIN (NITROSTAT) 0.4 MG SL tablet DISSOLVE 1 TABLET BY MOUTH UNDER THE TONGUE EVERY 5 MINUTES AS NEEDED FOR CHEST  PAIN 75 tablet 5  ? ondansetron (ZOFRAN) 8 MG tablet Take 1 tablet (8 mg total) by mouth every 8 (eight) hours as needed for nausea or vomiting. 20 tablet 0  ? ONETOUCH VERIO test strip USE TO TEST BLOOD SUGAR ONCE A DAY DX E11.51 - 50 strip 1  ? tamsulosin (FLOMAX) 0.4 MG CAPS capsule Take 0.4 mg by mouth every evening.    ? vitamin C (ASCORBIC ACID) 500 MG tablet Take 500 mg by mouth daily. Reported on 08/23/2015    ? ?No current facility-administered medications for this encounter.  ? ? ?Physical Findings: ? vitals were not taken for this visit.  ?Pain Assessment ?Pain Score: 0-No pain/10 ?Unable to assess due to telephone follow-up visit format. ? ?Lab Findings: ?Lab Results  ?Component Value Date  ? WBC 7.3 06/04/2021  ? HGB 11.8 (L) 06/04/2021  ? HCT 34.3 (L) 06/04/2021  ? MCV 98.8 06/04/2021  ? PLT 234 06/04/2021  ? ? ? ?Radiographic Findings: ?No results found. ? ?Impression/Plan: ?1. 71 y.o. gentleman with stage T1c adenocarcinoma of the prostate with a Gleason's score of 3+4 and a PSA of 7.78. ?He will continue to follow up with urology for ongoing PSA determinations and had a follow-up appointment with Dr. Gloriann Loan on 06/06/2021 but does not currently have any further follow-ups scheduled for his initial posttreatment PSA.  I advised that I will inform Dr. Gloriann Loan and that the patient should expect to hear from one of his staff within the next 1 to 2 weeks to get the follow-up appointment scheduled, likely in July 2023, approximately 3 months out from completing his radiation, at Dr. Purvis Sheffield discretion.  He understands what to expect with regards to PSA monitoring going forward. I will look forward to following his response to treatment via correspondence with urology, and would be happy to continue to participate in his care if clinically indicated. I talked to the patient about what to expect in the future, including his risk for erectile dysfunction and rectal bleeding. I encouraged him to call or return to the  office if he has any questions regarding his previous radiation or possible radiation side effects. He was comfortable with this plan and will follow up as needed.     ? ? ? ?Nicholos Johns, PA-C  ?

## 2021-07-06 NOTE — Progress Notes (Signed)
?  Radiation Oncology         [phone] ?________________________________ ? ?Name: Jose [name] MRN: [id number]  ?Date: 06/02/2021  DOB: Oct 07, 1950 ? ?End of Treatment Note ? ?Diagnosis:    71 y.o. gentleman with stage T1c adenocarcinoma of the prostate with a Gleason's score of 3+4 and a PSA of 7.78    ? ?Indication for treatment:  Curative, Definitive Radiotherapy      ? ?Radiation treatment dates:   04/25/21 - 06/02/21 ? ?Site/dose:   The prostate was treated to 70 Gy in 28 fractions of 2.5 Gy ? ?Beams/energy:   The patient was treated with IMRT using volumetric arc therapy delivering 6 MV X-rays to clockwise and counterclockwise circumferential arcs with a 90 degree collimator offset to avoid dose scalloping.  Image guidance was performed with daily cone beam CT prior to each fraction to align to gold markers in the prostate and assure proper bladder and rectal fill volumes.  Immobilization was achieved with BodyFix custom mold. ? ?Narrative: The patient tolerated radiation treatment relatively well with only minor urinary irritation and modest fatigue.  He did report a weaker flow of stream, frequency, urgency and nocturia 5-6 times per night despite taking Flomax daily as prescribed.  He also had some dysuria which improved with AZO as needed.  He experienced some loose stools/diarrhea as well as occasional nausea but denied abdominal pain. ? ?Plan: The patient has completed radiation treatment. He will return to radiation oncology clinic for routine followup in one month. I advised him to call or return sooner if he has any questions or concerns related to his recovery or treatment. ?________________________________ ? ?Sheral Apley Tammi Klippel, M.D.  ?

## 2021-07-12 ENCOUNTER — Other Ambulatory Visit: Payer: Self-pay | Admitting: Family Medicine

## 2021-07-12 ENCOUNTER — Other Ambulatory Visit: Payer: Self-pay | Admitting: Cardiovascular Disease

## 2021-07-15 ENCOUNTER — Other Ambulatory Visit: Payer: Self-pay | Admitting: Internal Medicine

## 2021-07-19 ENCOUNTER — Ambulatory Visit (INDEPENDENT_AMBULATORY_CARE_PROVIDER_SITE_OTHER): Payer: Medicare Other | Admitting: Dermatology

## 2021-07-19 ENCOUNTER — Encounter: Payer: Self-pay | Admitting: Dermatology

## 2021-07-19 DIAGNOSIS — Z1283 Encounter for screening for malignant neoplasm of skin: Secondary | ICD-10-CM | POA: Diagnosis not present

## 2021-07-19 DIAGNOSIS — L570 Actinic keratosis: Secondary | ICD-10-CM | POA: Diagnosis not present

## 2021-07-19 DIAGNOSIS — D485 Neoplasm of uncertain behavior of skin: Secondary | ICD-10-CM

## 2021-07-19 DIAGNOSIS — C4492 Squamous cell carcinoma of skin, unspecified: Secondary | ICD-10-CM

## 2021-07-19 DIAGNOSIS — I2510 Atherosclerotic heart disease of native coronary artery without angina pectoris: Secondary | ICD-10-CM

## 2021-07-19 DIAGNOSIS — D0439 Carcinoma in situ of skin of other parts of face: Secondary | ICD-10-CM

## 2021-07-19 HISTORY — DX: Squamous cell carcinoma of skin, unspecified: C44.92

## 2021-07-19 NOTE — Patient Instructions (Signed)

## 2021-07-21 ENCOUNTER — Ambulatory Visit (INDEPENDENT_AMBULATORY_CARE_PROVIDER_SITE_OTHER): Payer: Managed Care, Other (non HMO) | Admitting: Cardiovascular Disease

## 2021-07-21 ENCOUNTER — Encounter: Payer: Self-pay | Admitting: Cardiovascular Disease

## 2021-07-21 VITALS — BP 106/62 | HR 81 | Ht 65.0 in | Wt 182.2 lb

## 2021-07-21 DIAGNOSIS — E782 Mixed hyperlipidemia: Secondary | ICD-10-CM | POA: Diagnosis not present

## 2021-07-21 DIAGNOSIS — I10 Essential (primary) hypertension: Secondary | ICD-10-CM

## 2021-07-21 DIAGNOSIS — I2510 Atherosclerotic heart disease of native coronary artery without angina pectoris: Secondary | ICD-10-CM

## 2021-07-21 NOTE — Progress Notes (Signed)
Cardiology Office Note:    Date:  07/21/2021   ID:  DUBLIN GRAYER, DOB 03/24/1950, MRN [id number]  PCP:  [name], [name], MD   [facility] Cardiologist:  [name], MD     Referring MD: [name], [name]   No chief complaint on file.   History of Present Illness:    Jose [name] is a 71 y.o. male with a hx of: Coronary artery disease  S/p DES to LCx in 07/2008 Cath 10/2013: patent LCx stent  Hypertension  Hyperlipidemia  GERD  Bifascicular block (RBBB, LAFB) Hx of TIA Hx of seizures   The patient is here alone today.  He has been going through prostate cancer treatment with radiation therapy.  He is beginning to feel better but had a lot of fatigue and local GU symptoms during the last few weeks of his radiation treatment.  He has not had any chest pain or chest pressure.  No shortness of breath, orthopnea, PND, or heart palpitations.  He does have some leg edema especially on Sundays when he is at church standing on his feet for long periods of time.  Compression socks have helped with this.  No other complaints today.  Past Medical History:  Diagnosis Date   ALCOHOL ABUSE 07/27/2008   Allergy    ANXIETY 07/27/2008   Arthritis    Bifascicular block    beta blocker stopped due to profound bradycardia   CAD 07/27/2008   a. s/p Endeavor DES to CFX 07/2008; residual OM1 70%, EF 45%;  b. relook cath 5/10: patent stent in CFX;  c.  LHC (8/15):  prox LAD 30%, OM1 50% at bifurcation, OM1 sub-branch 50%, mid AVCFX stent patent, EF 55-65% - no change from 2010 >>> Med Rx (after abnormal nuc)    Cataract    Diabetes mellitus without complication ([place])    GERD 09/18/2006   History of TIA (transient ischemic attack) may 2010   Hx of adenomatous colonic polyps    Hx of cardiovascular stress test    ETT-Myoview (09/2013):  Partially fixed defect with some reversibility - cannot r/o inf ischemia vs diaph atten, EF 51%; mod risk   Hyperlipidemia     HYPERTENSION 09/18/2006   LOW BACK PAIN 09/18/2006   Myocardial infarction ([place]) 2010   Numbness and tingling    finger tips at times   Rosacea 07/27/2008   SEIZURE DISORDER 09/18/2006   none since 2008   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   Urinary tract infection start cipro 01-07-2012    Past Surgical History:  Procedure Laterality Date   CORONARY STENT PLACEMENT     drug eluting   GOLD SEED IMPLANT N/A 04/11/2021   Procedure: GOLD SEED IMPLANT SPACE OAR;  Surgeon: Robley Fries, MD;  Location: [facility];  Service: Urology;  Laterality: N/A;   HERNIA REPAIR     INGUINAL HERNIA REPAIR  01/14/2012   Procedure: LAPAROSCOPIC INGUINAL HERNIA;  Surgeon: Ralene Ok, MD;  Location: [facility];  Service: General;  Laterality: Right;   INSERTION OF MESH  01/14/2012   Procedure: INSERTION OF MESH;  Surgeon: Ralene Ok, MD;  Location: [facility];  Service: General;  Laterality: Right;   KNEE SURGERY     LEFT HEART CATHETERIZATION WITH CORONARY ANGIOGRAM N/A 10/21/2013   Procedure: LEFT HEART CATHETERIZATION WITH CORONARY ANGIOGRAM;  Surgeon: Blane Ohara, MD;  Location: [facility] CATH LAB;  Service: Cardiovascular;  Laterality: N/A;   MEDIAL PARTIAL KNEE REPLACEMENT Left  Current Medications: Current Meds  Medication Sig   ALPRAZolam (XANAX) 0.5 MG tablet TAKE 1 TABLET BY MOUTH THREE TIMES A DAY AS NEEDED (Patient taking differently: Take 0.5 mg by mouth 3 (three) times daily as needed for anxiety.)   amLODipine (NORVASC) 5 MG tablet TAKE 1 TABLET (5 MG TOTAL) BY MOUTH DAILY.   aspirin EC 81 MG tablet Take 81 mg by mouth at bedtime. Swallow whole.   atorvastatin (LIPITOR) 20 MG tablet Take 1 tablet (20 mg total) by mouth daily.   Azelaic Acid 15 % cream APPLY 1 APPLICATION TOPICALLY AS DIRECTED. AFTER SKIN IS THOROUGHLY WASHED AND PATTED DRY, GENTLY BUT THOROUGHLY MASSAGE A THIN FILM OF AZELAIC ACID INTO THE AFFECTED AREA TWICE DAILY, IN THE MORNING AND EVENING. APPLIED TO FACE.  (Patient taking differently: 1 application. daily as needed.)   benazepril (LOTENSIN) 40 MG tablet TAKE 1 TABLET BY MOUTH EVERY DAY   bisoprolol (ZEBETA) 10 MG tablet Take 1 tablet (10 mg total) by mouth daily. Please call [phone] to schedule an overdue appointment for future refills.  Thank you.   cholecalciferol (VITAMIN D) 25 MCG (1000 UNIT) tablet Take 1,000 Units by mouth daily.   diclofenac sodium (VOLTAREN) 1 % GEL Apply 2 g topically 2 (two) times daily as needed (joint pain (knees)).    halobetasol (ULTRAVATE) 0.05 % cream Apply 1 application. topically 2 (two) times daily as needed (dry skin).   Lancets (ONETOUCH DELICA PLUS [id number]) MISC TEST WITH 1 LANCET PER USE, USE AS DIRECTED (MAX 30DAYS)   metFORMIN (GLUCOPHAGE) 1000 MG tablet TAKE 1 TABLET (1,000 MG TOTAL) BY MOUTH 2 (TWO) TIMES DAILY WITH A MEAL.   Multiple Vitamin (MULTIVITAMIN WITH MINERALS) TABS Take 1 tablet by mouth every evening.   nitroGLYCERIN (NITROSTAT) 0.4 MG SL tablet DISSOLVE 1 TABLET BY MOUTH UNDER THE TONGUE EVERY 5 MINUTES AS NEEDED FOR CHEST PAIN   ONETOUCH VERIO test strip USE TO TEST BLOOD SUGAR ONCE A DAY DX E11.51 -   tamsulosin (FLOMAX) 0.4 MG CAPS capsule Take 0.4 mg by mouth every evening.   vitamin C (ASCORBIC ACID) 500 MG tablet Take 500 mg by mouth daily. Reported on 08/23/2015     Allergies:   Patient has no known allergies.   Social History   Socioeconomic History   Marital status: Married    Spouse name: Not on file   Number of children: 2   Years of education: Not on file   Highest education level: Not on file  Occupational History   Occupation: Architectural technologist    Employer: [employer]  Tobacco Use   Smoking status: Never   Smokeless tobacco: Former    Types: Chew    Quit date: 02/03/2012  Substance and Sexual Activity   Alcohol use: No    Comment: past drinker 2-3 daily and sometimes binge drinks on the weekend   Drug use: No   Sexual activity:  Yes  Other Topics Concern   Not on file  Social History Narrative   Not on file   Social Determinants of Health   Financial Resource Strain: Not on file  Food Insecurity: Not on file  Transportation Needs: Not on file  Physical Activity: Not on file  Stress: Not on file  Social Connections: Not on file     Family History: The patient's family history includes Diabetes in his mother; Emphysema in his father; Heart attack in an other family member; Heart failure in his father; Hyperlipidemia in his mother;  Hypertension in his mother; Lung cancer in his mother; Melanoma in his paternal grandmother and sister. There is no history of Colon cancer, Stroke, Esophageal cancer, Liver cancer, Pancreatic cancer, Stomach cancer, or Rectal cancer.  ROS:   Please see the history of present illness.    All other systems reviewed and are negative.  EKGs/Labs/Other Studies Reviewed:    EKG:  EKG is not ordered today.   Recent Labs: 06/04/2021: ALT 29; Hemoglobin 11.8; Platelets 234 06/07/2021: BUN 10; Creatinine, Ser 1.00; Potassium 4.2; Sodium 139  Recent Lipid Panel    Component Value Date/Time   CHOL 141 07/05/2020 0929   TRIG 83.0 07/05/2020 0929   HDL 56.60 07/05/2020 0929   CHOLHDL 2 07/05/2020 0929   VLDL 16.6 07/05/2020 0929   LDLCALC 68 07/05/2020 0929   LDLDIRECT 163.3 07/09/2007 0818     Risk Assessment/Calculations:           Physical Exam:    VS:  BP 106/62   Pulse 81   Ht '5\' 5"'$  (1.651 m)   Wt 182 lb 3.2 oz (82.6 kg)   SpO2 96%   BMI 30.32 kg/m     Wt Readings from Last 3 Encounters:  07/21/21 182 lb 3.2 oz (82.6 kg)  06/04/21 174 lb (78.9 kg)  05/31/21 183 lb 8 oz (83.2 kg)     GEN:  Well nourished, well developed in no acute distress HEENT: Normal NECK: No JVD; No carotid bruits LYMPHATICS: No lymphadenopathy CARDIAC: RRR, no murmurs, rubs, gallops RESPIRATORY:  Clear to auscultation without rales, wheezing or rhonchi  ABDOMEN: Soft, non-tender,  non-distended MUSCULOSKELETAL: Trace bilateral pretibial edema; No deformity  SKIN: Warm and dry NEUROLOGIC:  Alert and oriented x 3 PSYCHIATRIC:  Normal affect   ASSESSMENT:    1. Coronary artery disease involving native coronary artery of native heart without angina pectoris   2. Essential hypertension   3. Mixed hyperlipidemia    PLAN:    In order of problems listed above:  The patient is stable without symptoms of angina.  His medical program is reviewed and includes multidrug therapy with aspirin, atorvastatin, amlodipine, benazepril, and bisoprolol.  No changes are made Blood pressure is well controlled.  He reports home readings in the same range.  We discussed consideration of stopping amlodipine because of his leg swelling.  He feels like this is very manageable and mild symptoms.  After shared decision-making conversation, he will remain on his same medicines without change. Treated with atorvastatin 20 mg daily.  Most recent labs reviewed with a cholesterol of 141, HDL 57, LDL 68.  Continue current therapy.      Medication Adjustments/Labs and Tests Ordered: Current medicines are reviewed at length with the patient today.  Concerns regarding medicines are outlined above.  No orders of the defined types were placed in this encounter.  No orders of the defined types were placed in this encounter.   There are no Patient Instructions on file for this visit.   Signed, [name], MD  07/21/2021 10:02 AM    [place]

## 2021-07-21 NOTE — Patient Instructions (Signed)
Medication Instructions:  Your physician recommends that you continue on your current medications as directed. Please refer to the Current Medication list given to you today.  *If you need a refill on your cardiac medications before your next appointment, please call your pharmacy*   Lab Work: NONE If you have labs (blood work) drawn today and your tests are completely normal, you will receive your results only by: MyChart Message (if you have MyChart) OR A paper copy in the mail If you have any lab test that is abnormal or we need to change your treatment, we will call you to review the results.   Testing/Procedures: NONE   Follow-Up: At [facility], you and your health needs are our priority.  As part of our continuing mission to provide you with exceptional heart care, we have created designated Provider Care Teams.  These Care Teams include your primary Cardiologist (physician) and Advanced Practice Providers (APPs -  Physician Assistants and Nurse Practitioners) who all work together to provide you with the care you need, when you need it.  We recommend signing up for the patient portal called "MyChart".  Sign up information is provided on this After Visit Summary.  MyChart is used to connect with patients for Virtual Visits (Telemedicine).  Patients are able to view lab/test results, encounter notes, upcoming appointments, etc.  Non-urgent messages can be sent to your provider as well.   To learn more about what you can do with MyChart, go to [email or web address].    Your next appointment:   1 year(s)  The format for your next appointment:   In Person  Provider:   Michael Cooper, MD      Important Information About Sugar       

## 2021-07-24 ENCOUNTER — Telehealth: Payer: Self-pay | Admitting: *Deleted

## 2021-07-24 ENCOUNTER — Encounter: Payer: Self-pay | Admitting: *Deleted

## 2021-07-24 NOTE — Telephone Encounter (Signed)
-----   Message from Lavonna Monarch, MD sent at 07/21/2021  5:08 PM EDT ----- Schedule surgery with Dr. Darene Lamer

## 2021-07-24 NOTE — Telephone Encounter (Signed)
Pathology results to patient- surgery appointment scheduled.  

## 2021-07-26 ENCOUNTER — Other Ambulatory Visit: Payer: Self-pay | Admitting: Internal Medicine

## 2021-08-03 ENCOUNTER — Telehealth: Payer: Self-pay | Admitting: *Deleted

## 2021-08-05 ENCOUNTER — Other Ambulatory Visit: Payer: Self-pay | Admitting: Cardiovascular Disease

## 2021-08-07 ENCOUNTER — Telehealth: Payer: Self-pay | Admitting: *Deleted

## 2021-08-08 ENCOUNTER — Telehealth: Payer: Self-pay | Admitting: *Deleted

## 2021-08-11 ENCOUNTER — Telehealth: Payer: Self-pay | Admitting: *Deleted

## 2021-08-11 ENCOUNTER — Encounter: Payer: Self-pay | Admitting: Dermatology

## 2021-08-11 NOTE — Progress Notes (Signed)
   Follow-Up Visit   Subjective  Jose [name] is a 71 y.o. male who presents for the following: Annual Exam (New lesion on top of nose  3 months- + bleed).  Annual skin examination, several crusts to check on face Location:  Duration:  Quality:  Associated Signs/Symptoms: Modifying Factors:  Severity:  Timing: Context:   Objective  Well appearing patient in no apparent distress; mood and affect are within normal limits. History of skin examination: No atypical pigmented lesions (all checked with dermoscopy).  1 possible new nonmelanoma skin cancer nose will be biopsied.  Right Nasal Sidewall Waxy 6 mm pink crust, rule out superficial carcinoma     Left Forehead (2) Gritty 4 mm pink crusts       All skin waist up examined.   Assessment & Plan    Neoplasm of uncertain behavior of skin Right Nasal Sidewall  Skin / nail biopsy Type of biopsy: tangential   Informed consent: discussed and consent obtained   Timeout: patient name, date of birth, surgical site, and procedure verified   Anesthesia: the lesion was anesthetized in a standard fashion   Anesthetic:  1% lidocaine w/ epinephrine 1-100,000 local infiltration Instrument used: flexible razor blade   Hemostasis achieved with: aluminum chloride and electrodesiccation   Outcome: patient tolerated procedure well   Post-procedure details: wound care instructions given    Specimen 1 - Surgical pathology Differential Diagnosis: bcc scc cautery only  Check Margins: No  AK (actinic keratosis) (2) Left Forehead  Destruction of lesion - Left Forehead Complexity: simple   Destruction method: cryotherapy   Informed consent: discussed and consent obtained   Timeout:  patient name, date of birth, surgical site, and procedure verified Lesion destroyed using liquid nitrogen: Yes   Cryotherapy cycles:  5 Outcome: patient tolerated procedure well with no complications   Post-procedure details: wound care instructions  given    Encounter for screening for malignant neoplasm of skin  Annual skin examination.      I, Lavonna Monarch, MD, have reviewed all documentation for this visit.  The documentation on 08/11/21 for the exam, diagnosis, procedures, and orders are all accurate and complete.

## 2021-08-18 ENCOUNTER — Inpatient Hospital Stay: Payer: Medicare Other | Admitting: *Deleted

## 2021-08-18 ENCOUNTER — Telehealth: Payer: Self-pay | Admitting: *Deleted

## 2021-08-18 NOTE — Telephone Encounter (Signed)
Rescheduled appointment per provider. Patient is aware of the changes made to his upcoming appointment.

## 2021-08-22 ENCOUNTER — Encounter: Payer: Self-pay | Admitting: *Deleted

## 2021-08-22 ENCOUNTER — Inpatient Hospital Stay: Payer: Medicare Other | Admitting: *Deleted

## 2021-08-22 DIAGNOSIS — C61 Malignant neoplasm of prostate: Secondary | ICD-10-CM

## 2021-08-22 NOTE — Progress Notes (Signed)
2 Identifiers were used for verification purposes only for this visit. No vital signs were taken as this was a telephone visit. Allergies and medications reviewed. Pt states he has pain to both shoulders 2/10. He uses Voltaren gel to area for relief. He rates fatigue a 4/10 on most days. Pt does have some urinary urgency and bowel urgency. Stools are loose at times and he uses Imodium. He also stated that he is getting up to bathroom at night approx. every hour . Pt said, " my urine stream is getting better."The patient exercises by walking his dog several times a day and doing yardwork. Pt will see Dr.Tafeen on Thursday to remove a skin cancer on his nose. Pt will see urologist on 08/25/21. Last colonoscopy was 09/2017. Repeat in 3-5 years. We discussed  eating more vegetables and I will mail him a copy of the [employer]. Pt is scheduled to see PCP this year for annual. Vaccination are updated. SCP reviewed and completed.

## 2021-08-24 ENCOUNTER — Encounter: Payer: Self-pay | Admitting: Dermatology

## 2021-08-24 ENCOUNTER — Ambulatory Visit (INDEPENDENT_AMBULATORY_CARE_PROVIDER_SITE_OTHER): Payer: Medicare Other | Admitting: Dermatology

## 2021-08-24 DIAGNOSIS — D0430 Carcinoma in situ of skin of unspecified part of face: Secondary | ICD-10-CM

## 2021-08-24 DIAGNOSIS — D0439 Carcinoma in situ of skin of other parts of face: Secondary | ICD-10-CM | POA: Diagnosis not present

## 2021-08-24 NOTE — Patient Instructions (Signed)

## 2021-09-05 ENCOUNTER — Other Ambulatory Visit: Payer: Self-pay

## 2021-09-05 ENCOUNTER — Encounter (HOSPITAL_COMMUNITY): Payer: Self-pay

## 2021-09-05 ENCOUNTER — Emergency Department (HOSPITAL_COMMUNITY)
Admission: EM | Admit: 2021-09-05 | Discharge: 2021-09-05 | Disposition: A | Discharge status: Home / Self Care | Payer: Medicare Other | Attending: Emergency Medicine | Admitting: Emergency Medicine

## 2021-09-05 ENCOUNTER — Emergency Department (HOSPITAL_COMMUNITY): Payer: Medicare Other

## 2021-09-05 DIAGNOSIS — T782XXA Anaphylactic shock, unspecified, initial encounter: Secondary | ICD-10-CM | POA: Insufficient documentation

## 2021-09-05 DIAGNOSIS — R944 Abnormal results of kidney function studies: Secondary | ICD-10-CM | POA: Diagnosis not present

## 2021-09-05 DIAGNOSIS — Z7984 Long term (current) use of oral hypoglycemic drugs: Secondary | ICD-10-CM | POA: Insufficient documentation

## 2021-09-05 DIAGNOSIS — Z79899 Other long term (current) drug therapy: Secondary | ICD-10-CM | POA: Diagnosis not present

## 2021-09-05 DIAGNOSIS — I959 Hypotension, unspecified: Secondary | ICD-10-CM | POA: Diagnosis not present

## 2021-09-05 DIAGNOSIS — R000 Tachycardia, unspecified: Secondary | ICD-10-CM | POA: Insufficient documentation

## 2021-09-05 DIAGNOSIS — Z7982 Long term (current) use of aspirin: Secondary | ICD-10-CM | POA: Diagnosis not present

## 2021-09-05 LAB — CBC
HCT: 40.4 % (ref 39.0–52.0)
Hemoglobin: 13.5 g/dL (ref 13.0–17.0)
MCH: 34.1 pg — ABNORMAL HIGH (ref 26.0–34.0)
MCHC: 33.4 g/dL (ref 30.0–36.0)
MCV: 102 fL — ABNORMAL HIGH (ref 80.0–100.0)
Platelets: 199 10*3/uL (ref 150–400)
RBC: 3.96 MIL/uL — ABNORMAL LOW (ref 4.22–5.81)
RDW: 12.5 % (ref 11.5–15.5)
WBC: 11.8 10*3/uL — ABNORMAL HIGH (ref 4.0–10.5)
nRBC: 0.2 % (ref 0.0–0.2)

## 2021-09-05 LAB — I-STAT CHEM 8, ED
BUN: 11 mg/dL (ref 8–23)
Calcium, Ion: 0.99 mmol/L — ABNORMAL LOW (ref 1.15–1.40)
Chloride: 101 mmol/L (ref 98–111)
Creatinine, Ser: 1.3 mg/dL — ABNORMAL HIGH (ref 0.61–1.24)
Glucose, Bld: 144 mg/dL — ABNORMAL HIGH (ref 70–99)
HCT: 40 % (ref 39.0–52.0)
Hemoglobin: 13.6 g/dL (ref 13.0–17.0)
Potassium: 3.5 mmol/L (ref 3.5–5.1)
Sodium: 138 mmol/L (ref 135–145)
TCO2: 22 mmol/L (ref 22–32)

## 2021-09-05 LAB — TROPONIN I (HIGH SENSITIVITY)
Troponin I (High Sensitivity): 7 ng/L (ref <18)
Troponin I (High Sensitivity): 13 ng/L (ref <18)

## 2021-09-05 LAB — ETHANOL: Alcohol, Ethyl (B): <10 mg/dL (ref <10)

## 2021-09-05 LAB — CBG MONITORING, ED: Glucose-Capillary: 123 mg/dL — ABNORMAL HIGH (ref 70–99)

## 2021-09-05 MED ORDER — DIPHENHYDRAMINE HCL 25 MG PO CAPS
25.0000 mg | ORAL_CAPSULE | Freq: Once | ORAL | Status: AC
Start: 2021-09-05 — End: 2021-09-05
  Administered 2021-09-05: 25 mg via ORAL
  Filled 2021-09-05: qty 1

## 2021-09-05 MED ORDER — EPINEPHRINE 0.3 MG/0.3ML IJ SOAJ: 0.3000 mg | INTRAMUSCULAR | 0 refills | Status: AC | PRN

## 2021-09-05 MED ORDER — SODIUM CHLORIDE 0.9 % IV SOLN: INTRAVENOUS | Status: DC

## 2021-09-05 MED ORDER — FAMOTIDINE IN NACL 20-0.9 MG/50ML-% IV SOLN
20.0000 mg | Freq: Once | INTRAVENOUS | Status: AC
  Administered 2021-09-05: 20 mg via INTRAVENOUS
  Filled 2021-09-05: qty 50

## 2021-09-05 MED ORDER — PREDNISONE 50 MG PO TABS: 50.0000 mg | ORAL_TABLET | Freq: Every day | ORAL | 0 refills | Status: DC

## 2021-09-05 MED ORDER — SODIUM CHLORIDE 0.9 % IV BOLUS
1000.0000 mL | Freq: Once | INTRAVENOUS | Status: AC
  Administered 2021-09-05: 1000 mL via INTRAVENOUS

## 2021-09-05 MED ORDER — FAMOTIDINE 20 MG PO TABS: 20.0000 mg | ORAL_TABLET | Freq: Two times a day (BID) | ORAL | 0 refills | Status: DC

## 2021-09-05 MED ORDER — LORATADINE 10 MG PO TABS: 10.0000 mg | ORAL_TABLET | Freq: Every day | ORAL | 0 refills | Status: DC

## 2021-09-05 NOTE — ED Triage Notes (Signed)
Pt bib [facility] from home with complaints of an allergic reaction. Pt states that he was walking around the house and just started feeling bad and had to lay down. Pt was found with a rash on torso and legs, pale, shaking, and diaphoretic. Pt was given 0.5 epi, 125 solumedrol, an albuterol treatment, and 50 of benadryl. EMS vitals: 200SBP

## 2021-09-05 NOTE — ED Provider Notes (Signed)
[facility] EMERGENCY DEPARTMENT Provider Note   CSN: [id number] Arrival date & time: 09/05/21  1510     History  Chief Complaint  Patient presents with   Allergic Reaction    Jose [name] is a 71 y.o. male.   Allergic Reaction  Patient states he felt fine today.  He was walking outside, walking the dog when he suddenly started to feel weak and lightheaded.  Patient was able to get up to his house but then collapsed.  Family called 911.  Its not clear.  True loss of consciousness.  When EMS arrived they noted the patient had diffuse erythematous rash.  Patient complaining of tingling and burning all over.  He does not recall being bitten by any insects.  No known exposure to any plants or chemicals.  No new medications.  Patient has not had this before.  EMS administered epinephrine, Solu-Medrol, IV fluids, Benadryl.  He was also given an albuterol treatment.  Patient is feeling somewhat better but still remains feeling weak and shaky    Home Medications Prior to Admission medications   Medication Sig Start Date End Date Taking? Authorizing Provider  EPINEPHrine 0.3 mg/0.3 mL IJ SOAJ injection Inject 0.3 mg into the muscle as needed for anaphylaxis. 09/05/21  Yes Dorie Rank, MD  famotidine (PEPCID) 20 MG tablet Take 1 tablet (20 mg total) by mouth 2 (two) times daily for 5 days. 09/05/21 09/10/21 Yes Dorie Rank, MD  loratadine (CLARITIN) 10 MG tablet Take 1 tablet (10 mg total) by mouth daily for 5 days. 09/05/21 09/10/21 Yes Dorie Rank, MD  predniSONE (DELTASONE) 50 MG tablet Take 1 tablet (50 mg total) by mouth daily. 09/05/21  Yes Dorie Rank, MD  ALPRAZolam Duanne Moron) 0.5 MG tablet TAKE 1 TABLET BY MOUTH THREE TIMES A DAY AS NEEDED Patient taking differently: Take 0.5 mg by mouth 3 (three) times daily as needed for anxiety. 02/01/21   [name], [name], MD  amLODipine (NORVASC) 5 MG tablet TAKE 1 TABLET (5 MG TOTAL) BY MOUTH DAILY. 04/06/21   [name], [name], MD   aspirin EC 81 MG tablet Take 81 mg by mouth at bedtime. Swallow whole.    [provider]  atorvastatin (LIPITOR) 20 MG tablet Take 1 tablet (20 mg total) by mouth daily. 03/21/21   [name], [name], MD  Azelaic Acid 15 % cream APPLY 1 APPLICATION TOPICALLY AS DIRECTED. AFTER SKIN IS THOROUGHLY WASHED AND PATTED DRY, GENTLY BUT THOROUGHLY MASSAGE A THIN FILM OF AZELAIC ACID INTO THE AFFECTED AREA TWICE DAILY, IN THE MORNING AND EVENING. APPLIED TO FACE. 10/22/18   [name], [name], MD  benazepril (LOTENSIN) 40 MG tablet TAKE 1 TABLET BY MOUTH EVERY DAY 07/05/21   [name], [name], MD  bisoprolol (ZEBETA) 10 MG tablet Take 1 tablet (10 mg total) by mouth daily. 08/07/21   [name], MD  cholecalciferol (VITAMIN D) 25 MCG (1000 UNIT) tablet Take 1,000 Units by mouth daily.    [provider]  diclofenac sodium (VOLTAREN) 1 % GEL Apply 2 g topically 2 (two) times daily as needed (joint pain (knees)).     [provider]  halobetasol (ULTRAVATE) 0.05 % cream Apply 1 application. topically 2 (two) times daily as needed (dry skin).    [provider]  Lancets (ONETOUCH DELICA PLUS [id number]) [place] TEST WITH 1 LANCET PER USE, USE AS DIRECTED (MAX 30DAYS) 07/17/21   [name], [name], MD  metFORMIN (GLUCOPHAGE)  1000 MG tablet TAKE 1 TABLET (1,000 MG TOTAL) BY MOUTH 2 (TWO) TIMES DAILY WITH A MEAL. 07/26/21   [name], [name], MD  Multiple Vitamin (MULTIVITAMIN WITH MINERALS) TABS Take 1 tablet by mouth every evening.    [provider]  nitroGLYCERIN (NITROSTAT) 0.4 MG SL tablet DISSOLVE 1 TABLET BY MOUTH UNDER THE TONGUE EVERY 5 MINUTES AS NEEDED FOR CHEST PAIN 02/08/17   Bensimhon, Shaune Pascal, MD  ondansetron (ZOFRAN) 8 MG tablet Take 1 tablet (8 mg total) by mouth every 8 (eight) hours as needed for nausea or vomiting. 05/23/21   Bruning, Ashlyn, PA-C  ONETOUCH VERIO test strip USE TO TEST BLOOD SUGAR ONCE A DAY DX  E11.51 - 07/12/21   [name], [name], MD  tadalafil (CIALIS) 20 MG tablet Take 20 mg by mouth as needed.    [provider]  tamsulosin (FLOMAX) 0.4 MG CAPS capsule Take 0.4 mg by mouth every evening. 05/26/21   [provider]  vitamin C (ASCORBIC ACID) 500 MG tablet Take 500 mg by mouth daily. Reported on 08/23/2015    [provider]      Allergies    Patient has no known allergies.    Review of Systems   Review of Systems  Physical Exam Updated Vital Signs BP 137/72   Pulse 92   Temp 97.7 F (36.5 C) (Oral)   Resp 17   Ht 1.651 m ('5\' 5"'$ )   Wt 77.1 kg   SpO2 93%   BMI 28.29 kg/m  Physical Exam Vitals and nursing note reviewed.  Constitutional:      Appearance: He is well-developed. He is ill-appearing.  HENT:     Head: Normocephalic and atraumatic.     Right Ear: External ear normal.     Left Ear: External ear normal.  Eyes:     General: No scleral icterus.       Right eye: No discharge.        Left eye: No discharge.     Conjunctiva/sclera: Conjunctivae normal.  Neck:     Trachea: No tracheal deviation.  Cardiovascular:     Rate and Rhythm: Regular rhythm. Tachycardia present.  Pulmonary:     Effort: Pulmonary effort is normal. No respiratory distress.     Breath sounds: Normal breath sounds. No stridor. No wheezing or rales.  Abdominal:     General: Bowel sounds are normal. There is no distension.     Palpations: Abdomen is soft.     Tenderness: There is no abdominal tenderness. There is no guarding or rebound.  Musculoskeletal:        General: No tenderness or deformity.     Cervical back: Neck supple.  Skin:    General: Skin is warm and dry.     Coloration: Skin is pale.     Findings: Rash present.     Comments: Erythematous urticarial rash on torso extremities and most prominent in his bilateral thighs  Neurological:     General: No focal deficit present.     Mental Status: He is alert.     Cranial Nerves: No cranial  nerve deficit (no facial droop, extraocular movements intact, no slurred speech).     Sensory: No sensory deficit.     Motor: No abnormal muscle tone or seizure activity.     Coordination: Coordination normal.  Psychiatric:        Mood and Affect: Mood normal.     ED Results / Procedures / Treatments  Labs (all labs ordered are listed, but only abnormal results are displayed) Labs Reviewed  CBC - Abnormal; Notable for the following components:      Result Value   WBC 11.8 (*)    RBC 3.96 (*)    MCV 102.0 (*)    MCH 34.1 (*)    All other components within normal limits  CBG MONITORING, ED - Abnormal; Notable for the following components:   Glucose-Capillary 123 (*)    All other components within normal limits  I-STAT CHEM 8, ED - Abnormal; Notable for the following components:   Creatinine, Ser 1.30 (*)    Glucose, Bld 144 (*)    Calcium, Ion 0.99 (*)    All other components within normal limits  ETHANOL  TROPONIN I (HIGH SENSITIVITY)  TROPONIN I (HIGH SENSITIVITY)    EKG EKG Interpretation  Date/Time:  Tuesday September 05 2021 15:16:40 EDT Ventricular Rate:  132 PR Interval:  105 QRS Duration: 142 QT Interval:  433 QTC Calculation: 642 R Axis:   251 Text Interpretation: Sinus tachycardia Right bundle branch block No significant change since last tracing Confirmed by Dorie Rank [phone]) on 09/05/2021 3:28:18 PM  Radiology DG Chest Portable 1 View  Result Date: 09/05/2021 CLINICAL DATA:  Syncope. EXAM: PORTABLE CHEST 1 VIEW COMPARISON:  06/04/2021 FINDINGS: The cardiomediastinal silhouette is unremarkable. Mild peribronchial thickening again noted. There is no evidence of focal airspace disease, pulmonary edema, suspicious pulmonary nodule/mass, pleural effusion, or pneumothorax. No acute bony abnormalities are identified. IMPRESSION: No active disease. Electronically Signed   By: Margarette Canada M.D.   On: 09/05/2021 15:55    Procedures .Critical Care  Performed by: Dorie Rank, MD Authorized by: Dorie Rank, MD   Critical care provider statement:    Critical care time (minutes):  30   Critical care was time spent personally by me on the following activities:  Development of treatment plan with patient or surrogate, discussions with consultants, evaluation of patient's response to treatment, examination of patient, ordering and review of laboratory studies, ordering and review of radiographic studies, ordering and performing treatments and interventions, pulse oximetry, re-evaluation of patient's condition and review of old charts     Medications Ordered in ED Medications  sodium chloride 0.9 % bolus 1,000 mL (0 mLs Intravenous Stopped 09/05/21 1653)    And  0.9 %  sodium chloride infusion ( Intravenous New Bag/Given 09/05/21 1725)  diphenhydrAMINE (BENADRYL) capsule 25 mg (has no administration in time range)  famotidine (PEPCID) IVPB 20 mg premix (0 mg Intravenous Stopped 09/05/21 1608)    ED Course/ Medical Decision Making/ A&P Clinical Course as of 09/05/21 2102  Tue Sep 05, 2021  1600 DG Chest Portable 1 View Chest x-ray images and radiology report reviewed.  No acute findings [JK]  1720 I-stat chem 8, ED (not at [facility] or [facility])(!) Creatinine elevated 1.3.  Glucose elevated 144 [JK]  1720 Troponin I (High Sensitivity) First troponin normal [JK]  1720 Ethanol Normal [JK]  1720 CBC(!) No anemia noted [JK]  1816 Patient continues to improve.  Rash has resolved.  Blood pressure is stable.  Tachycardia has also improved [JK]  2101 Patient has remained stable.  He states he is having some itching in 1 spot of his foot.  We will give another dose of Benadryl.  This appears to possibly be the area that he was bit or stung.  He does not have any recurrent urticaria [JK]    Clinical Course User Index [JK] Dorie Rank,  MD                           Medical Decision Making Problems Addressed: Anaphylaxis, initial encounter: acute illness or injury that poses a  threat to life or bodily functions  Amount and/or Complexity of Data Reviewed Labs: ordered. Decision-making details documented in ED Course. Radiology: ordered and independent interpretation performed. Decision-making details documented in ED Course.  Risk OTC drugs. Prescription drug management. Drug therapy requiring intensive monitoring for toxicity. Decision regarding hospitalization.   Patient presented after an episode of hypotension associated with diffuse rash.  Presentation was suggestive of acute anaphylaxis.  Etiology of this however was unclear.  Patient was treated by EMS prior to arrival.  Patient was initially hypotensive but his blood pressure has improved and stabilized while he has been in the ED.  Patient was initially tachycardic most likely related to the epinephrine but that has resolved.  Patient has not had any recurrent urticaria or anaphylaxis.  Unclear what triggered this but most likely some sort of insect bite or sting as he was outside.  Will discharge home with an EpiPen as well as Benadryl and steroids.  Outpatient follow-up with an allergist.  Warning signs precautions discussed  Evaluation and diagnostic testing in the emergency department does not suggest an emergent condition requiring admission or immediate intervention beyond what has been performed at this time.  The patient is safe for discharge and has been instructed to return immediately for worsening symptoms, change in symptoms or any other concerns.         Final Clinical Impression(s) / ED Diagnoses Final diagnoses:  Anaphylaxis, initial encounter    Rx / DC Orders ED Discharge Orders          Ordered    predniSONE (DELTASONE) 50 MG tablet  Daily        09/05/21 2100    EPINEPHrine 0.3 mg/0.3 mL IJ SOAJ injection  As needed        09/05/21 2100    famotidine (PEPCID) 20 MG tablet  2 times daily        09/05/21 2100    loratadine (CLARITIN) 10 MG tablet  Daily        09/05/21 2100               Dorie Rank, MD 09/05/21 2103

## 2021-09-05 NOTE — Discharge Instructions (Addendum)
Take the medications as prescribed.  Make sure to keep the EpiPen around with you at all times in case you have another allergic reaction.  Follow-up with an allergy doctor for testing to help determine the cause of your reaction.  Return as needed for recurrent or worsening symptoms

## 2021-09-11 ENCOUNTER — Encounter: Payer: Self-pay | Admitting: Dermatology

## 2021-09-11 NOTE — Progress Notes (Signed)
   Follow-Up Visit   Subjective  Jose [name] is a 71 y.o. male who presents for the following: Procedure (Scc x1 ).  Biopsy proven carcinoma in situ nose Location:  Duration:  Quality:  Associated Signs/Symptoms: Modifying Factors:  Severity:  Timing: Context:   Objective  Well appearing patient in no apparent distress; mood and affect are within normal limits. Right Nasal Sidewall Lesion identified by Dr.Raneen Jaffer and nurse in room.      A focused examination was performed including head and neck. Relevant physical exam findings are noted in the Assessment and Plan.   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  Destruction of lesion Complexity: simple   Destruction method: electrodesiccation and curettage   Informed consent: discussed and consent obtained   Timeout:  patient name, date of birth, surgical site, and procedure verified Anesthesia: the lesion was anesthetized in a standard fashion   Anesthetic:  1% lidocaine w/ epinephrine 1-100,000 local infiltration Curettage performed in three different directions: Yes   Electrodesiccation performed over the curetted area: Yes   Curettage cycles:  3 Lesion length (cm):  1.1 Lesion width (cm):  1.1 Margin per side (cm):  0 Final wound size (cm):  1.1 Hemostasis achieved with:  aluminum chloride Outcome: patient tolerated procedure well with no complications   Post-procedure details: wound care instructions given        I, Lavonna Monarch, MD, have reviewed all documentation for this visit.  The documentation on 09/11/21 for the exam, diagnosis, procedures, and orders are all accurate and complete.

## 2021-09-14 ENCOUNTER — Other Ambulatory Visit: Payer: Self-pay | Admitting: Internal Medicine

## 2021-09-21 ENCOUNTER — Encounter: Payer: Medicare Other | Admitting: Dermatology

## 2021-10-05 ENCOUNTER — Other Ambulatory Visit: Payer: Self-pay | Admitting: Internal Medicine

## 2021-10-17 ENCOUNTER — Other Ambulatory Visit: Payer: Self-pay | Admitting: Internal Medicine

## 2021-10-17 NOTE — Progress Notes (Unsigned)
New Patient Note  RE: Jose [name] MRN: [id number] DOB: 30-Jun-1950 Date of Office Visit: 10/18/2021  Consult requested by: [name], [place] [facility]* Primary care provider: [name], [name], MD  Chief Complaint: No chief complaint on file.  History of Present Illness: I had the pleasure of seeing Jose [name] for initial evaluation at the [facility] on 10/17/2021. He is a 71 y.o. male, who is referred here by [name], [name], MD for the evaluation of ***.  ***  Assessment and Plan: [name] is a 71 y.o. male with: No problem-specific Assessment & Plan notes found for this encounter.  No follow-ups on file.  No orders of the defined types were placed in this encounter.  Lab Orders  No laboratory test(s) ordered today    Other allergy screening: Asthma: {Blank single:19197::"yes","no"} Rhino conjunctivitis: {Blank single:19197::"yes","no"} Food allergy: {Blank single:19197::"yes","no"} Medication allergy: {Blank single:19197::"yes","no"} Hymenoptera allergy: {Blank single:19197::"yes","no"} Urticaria: {Blank single:19197::"yes","no"} Eczema:{Blank single:19197::"yes","no"} History of recurrent infections suggestive of immunodeficency: {Blank single:19197::"yes","no"}  Diagnostics: Spirometry:  Tracings reviewed. His effort: {Blank single:19197::"Good reproducible efforts.","It was hard to get consistent efforts and there is a question as to whether this reflects a maximal maneuver.","Poor effort, data can not be interpreted."} FVC: ***L FEV1: ***L, ***% predicted FEV1/FVC ratio: ***% Interpretation: {Blank single:19197::"Spirometry consistent with mild obstructive disease","Spirometry consistent with moderate obstructive disease","Spirometry consistent with severe obstructive disease","Spirometry consistent with possible restrictive disease","Spirometry consistent with mixed obstructive and restrictive disease","Spirometry uninterpretable  due to technique","Spirometry consistent with normal pattern","No overt abnormalities noted given today's efforts"}.  Please see scanned spirometry results for details.  Skin Testing: {Blank single:19197::"Select foods","Environmental allergy panel","Environmental allergy panel and select foods","Food allergy panel","None","Deferred due to recent antihistamines use"}. *** Results discussed with patient/family.   Past Medical History: Patient Active Problem List   Diagnosis Date Noted  . Malignant neoplasm of prostate ([place]) 02/28/2021  . Genetic testing 02/28/2021  . Diabetes mellitus with peripheral vascular disease ([place]) 11/29/2015  . TSH elevation 12/21/2013  . Elevated PSA 03/11/2012  . Right inguinal hernia 11/30/2011  . Bradycardia 06/19/2010  . RT BUNDLE BRANCH BLOCK&LT ANT FASCICULAR BLOCK 12/15/2008  . Dyslipidemia 08/05/2008  . ANXIETY 07/27/2008  . ALCOHOL ABUSE 07/27/2008  . Coronary atherosclerosis 07/27/2008  . ROSACEA 07/27/2008  . FACIAL PARESTHESIA, RIGHT 07/27/2008  . Personal history of colonic polyps 01/08/2007  . Essential hypertension 09/18/2006  . GERD 09/18/2006  . LOW BACK PAIN 09/18/2006   Past Medical History:  Diagnosis Date  . ALCOHOL ABUSE 07/27/2008  . Allergy   . ANXIETY 07/27/2008  . Arthritis   . Bifascicular block    beta blocker stopped due to profound bradycardia  . CAD 07/27/2008   a. s/p Endeavor DES to CFX 07/2008; residual OM1 70%, EF 45%;  b. relook cath 5/10: patent stent in CFX;  c.  LHC (8/15):  prox LAD 30%, OM1 50% at bifurcation, OM1 sub-branch 50%, mid AVCFX stent patent, EF 55-65% - no change from 2010 >>> Med Rx (after abnormal nuc)   . Cataract   . Diabetes mellitus without complication ([place])   . GERD 09/18/2006  . History of TIA (transient ischemic attack) 07/2008  . Hx of adenomatous colonic polyps   . Hx of cardiovascular stress test    ETT-Myoview (09/2013):  Partially fixed defect with some reversibility - cannot r/o  inf ischemia vs diaph atten, EF 51%; mod risk  . Hyperlipidemia   . HYPERTENSION 09/18/2006  . LOW BACK PAIN 09/18/2006  . Myocardial  infarction ([place]) 2010  . Numbness and tingling    finger tips at times  . Rosacea 07/27/2008  . SEIZURE DISORDER 09/18/2006   none since 2008  . Squamous cell carcinoma of skin 07/19/2021   in situ - right nasal sidewall  . 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  . Urinary tract infection start cipro 01-07-2012   Past Surgical History: Past Surgical History:  Procedure Laterality Date  . CORONARY STENT PLACEMENT     drug eluting  . GOLD SEED IMPLANT N/A 04/11/2021   Procedure: GOLD SEED IMPLANT SPACE OAR;  Surgeon: Robley Fries, MD;  Location: [facility];  Service: Urology;  Laterality: N/A;  . HERNIA REPAIR    . INGUINAL HERNIA REPAIR  01/14/2012   Procedure: LAPAROSCOPIC INGUINAL HERNIA;  Surgeon: Ralene Ok, MD;  Location: [facility];  Service: General;  Laterality: Right;  . INSERTION OF MESH  01/14/2012   Procedure: INSERTION OF MESH;  Surgeon: Ralene Ok, MD;  Location: [facility];  Service: General;  Laterality: Right;  . KNEE SURGERY    . LEFT HEART CATHETERIZATION WITH CORONARY ANGIOGRAM N/A 10/21/2013   Procedure: LEFT HEART CATHETERIZATION WITH CORONARY ANGIOGRAM;  Surgeon: Blane Ohara, MD;  Location: [facility] CATH LAB;  Service: Cardiovascular;  Laterality: N/A;  . MEDIAL PARTIAL KNEE REPLACEMENT Left    Medication List:  Current Outpatient Medications  Medication Sig Dispense Refill  . ALPRAZolam (XANAX) 0.5 MG tablet TAKE 1 TABLET BY MOUTH THREE TIMES A DAY AS NEEDED (Patient taking differently: Take 0.5 mg by mouth 3 (three) times daily as needed for anxiety.) 60 tablet 0  . amLODipine (NORVASC) 5 MG tablet TAKE 1 TABLET (5 MG TOTAL) BY MOUTH DAILY. 90 tablet 1  . aspirin EC 81 MG tablet Take 81 mg by mouth at bedtime. Swallow whole.    Marland Kitchen atorvastatin (LIPITOR) 20 MG tablet TAKE 1 TABLET BY MOUTH EVERY DAY 90 tablet 0  .  Azelaic Acid 15 % cream APPLY 1 APPLICATION TOPICALLY AS DIRECTED. AFTER SKIN IS THOROUGHLY WASHED AND PATTED DRY, GENTLY BUT THOROUGHLY MASSAGE A THIN FILM OF AZELAIC ACID INTO THE AFFECTED AREA TWICE DAILY, IN THE MORNING AND EVENING. APPLIED TO FACE. 50 g 0  . benazepril (LOTENSIN) 40 MG tablet TAKE 1 TABLET BY MOUTH EVERY DAY 90 tablet 1  . bisoprolol (ZEBETA) 10 MG tablet Take 1 tablet (10 mg total) by mouth daily. 90 tablet 3  . cholecalciferol (VITAMIN D) 25 MCG (1000 UNIT) tablet Take 1,000 Units by mouth daily.    . diclofenac sodium (VOLTAREN) 1 % GEL Apply 2 g topically 2 (two) times daily as needed (joint pain (knees)).     Marland Kitchen EPINEPHrine 0.3 mg/0.3 mL IJ SOAJ injection Inject 0.3 mg into the muscle as needed for anaphylaxis. 1 each 0  . famotidine (PEPCID) 20 MG tablet Take 1 tablet (20 mg total) by mouth 2 (two) times daily for 5 days. 10 tablet 0  . halobetasol (ULTRAVATE) 0.05 % cream Apply 1 application. topically 2 (two) times daily as needed (dry skin).    . Lancets (ONETOUCH DELICA PLUS [id number]) MISC TEST WITH 1 LANCET PER USE, USE AS DIRECTED (MAX 30DAYS) 100 each 0  . loratadine (CLARITIN) 10 MG tablet Take 1 tablet (10 mg total) by mouth daily for 5 days. 5 tablet 0  . metFORMIN (GLUCOPHAGE) 1000 MG tablet TAKE 1 TABLET (1,000 MG TOTAL) BY MOUTH 2 (TWO) TIMES DAILY WITH A MEAL. 180 tablet 1  . Multiple Vitamin (MULTIVITAMIN WITH MINERALS)  TABS Take 1 tablet by mouth every evening.    . nitroGLYCERIN (NITROSTAT) 0.4 MG SL tablet DISSOLVE 1 TABLET BY MOUTH UNDER THE TONGUE EVERY 5 MINUTES AS NEEDED FOR CHEST PAIN 75 tablet 5  . ondansetron (ZOFRAN) 8 MG tablet Take 1 tablet (8 mg total) by mouth every 8 (eight) hours as needed for nausea or vomiting. 20 tablet 0  . ONETOUCH VERIO test strip USE TO TEST BLOOD SUGAR ONCE A DAY DX E11.51 - 50 strip 1  . predniSONE (DELTASONE) 50 MG tablet Take 1 tablet (50 mg total) by mouth daily. 5 tablet 0  . tadalafil (CIALIS) 20 MG tablet Take  20 mg by mouth as needed.    . tamsulosin (FLOMAX) 0.4 MG CAPS capsule Take 0.4 mg by mouth every evening.    . vitamin C (ASCORBIC ACID) 500 MG tablet Take 500 mg by mouth daily. Reported on 08/23/2015     No current facility-administered medications for this visit.   Allergies: No Known Allergies Social History: Social History   Socioeconomic History  . Marital status: Married    Spouse name: Not on file  . Number of children: 2  . Years of education: Not on file  . Highest education level: Not on file  Occupational History  . Occupation: Civil engineer, contracting: [employer]  Tobacco Use  . Smoking status: Never  . Smokeless tobacco: Former    Types: Chew    Quit date: 02/03/2012  Substance and Sexual Activity  . Alcohol use: No    Comment: past drinker 2-3 daily and sometimes binge drinks on the weekend  . Drug use: No  . Sexual activity: Yes  Other Topics Concern  . Not on file  Social History Narrative  . Not on file   Social Determinants of Health   Financial Resource Strain: Not on file  Food Insecurity: Not on file  Transportation Needs: Not on file  Physical Activity: Not on file  Stress: Not on file  Social Connections: Not on file   Lives in a ***. Smoking: *** Occupation: ***  Environmental HistoryFreight forwarder in the house: Estate agent in the family room: {Blank single:19197::"yes","no"} Carpet in the bedroom: {Blank single:19197::"yes","no"} Heating: {Blank single:19197::"electric","gas","heat pump"} Cooling: {Blank single:19197::"central","window","heat pump"} Pet: {Blank single:19197::"yes ***","no"}  Family History: Family History  Problem Relation Age of Onset  . Lung cancer Mother   . Hypertension Mother   . Diabetes Mother   . Hyperlipidemia Mother   . Emphysema Father   . Heart failure Father   . Melanoma Sister   . Melanoma Paternal Grandmother   . Heart attack  Other   . Colon cancer Neg Hx   . Stroke Neg Hx   . Esophageal cancer Neg Hx   . Liver cancer Neg Hx   . Pancreatic cancer Neg Hx   . Stomach cancer Neg Hx   . Rectal cancer Neg Hx    Problem                               Relation Asthma                                   *** Eczema                                ***  Food allergy                          *** Allergic rhino conjunctivitis     ***  Review of Systems  Constitutional:  Negative for appetite change, chills, fever and unexpected weight change.  HENT:  Negative for congestion and rhinorrhea.   Eyes:  Negative for itching.  Respiratory:  Negative for cough, chest tightness, shortness of breath and wheezing.   Cardiovascular:  Negative for chest pain.  Gastrointestinal:  Negative for abdominal pain.  Genitourinary:  Negative for difficulty urinating.  Skin:  Negative for rash.  Neurological:  Negative for headaches.   Objective: There were no vitals taken for this visit. There is no height or weight on file to calculate BMI. Physical Exam Vitals and nursing note reviewed.  Constitutional:      Appearance: Normal appearance. He is well-developed.  HENT:     Head: Normocephalic and atraumatic.     Right Ear: Tympanic membrane and external ear normal.     Left Ear: Tympanic membrane and external ear normal.     Nose: Nose normal.     Mouth/Throat:     Mouth: Mucous membranes are moist.     Pharynx: Oropharynx is clear.  Eyes:     Conjunctiva/sclera: Conjunctivae normal.  Cardiovascular:     Rate and Rhythm: Normal rate and regular rhythm.     Heart sounds: Normal heart sounds. No murmur heard.    No friction rub. No gallop.  Pulmonary:     Effort: Pulmonary effort is normal.     Breath sounds: Normal breath sounds. No wheezing, rhonchi or rales.  Musculoskeletal:     Cervical back: Neck supple.  Skin:    General: Skin is warm.     Findings: No rash.  Neurological:     Mental Status: He is alert and oriented  to person, place, and time.  Psychiatric:        Behavior: Behavior normal.  The plan was reviewed with the patient/family, and all questions/concerned were addressed.  It was my pleasure to see Jarrette today and participate in his care. Please feel free to contact me with any questions or concerns.  Sincerely,  Rexene Alberts, DO Allergy & Immunology  [facility] office: [place] office: [phone]

## 2021-10-18 ENCOUNTER — Other Ambulatory Visit: Payer: Self-pay

## 2021-10-18 ENCOUNTER — Ambulatory Visit (INDEPENDENT_AMBULATORY_CARE_PROVIDER_SITE_OTHER): Payer: Medicare Other | Admitting: Allergy

## 2021-10-18 ENCOUNTER — Encounter: Payer: Self-pay | Admitting: Allergy

## 2021-10-18 VITALS — BP 132/78 | HR 56 | Temp 98.1°F | Resp 16 | Ht 66.0 in | Wt 180.4 lb

## 2021-10-18 DIAGNOSIS — T782XXA Anaphylactic shock, unspecified, initial encounter: Secondary | ICD-10-CM | POA: Diagnosis not present

## 2021-10-18 DIAGNOSIS — L500 Allergic urticaria: Secondary | ICD-10-CM | POA: Diagnosis not present

## 2021-10-18 DIAGNOSIS — T782XXD Anaphylactic shock, unspecified, subsequent encounter: Secondary | ICD-10-CM

## 2021-10-18 NOTE — Patient Instructions (Addendum)
Allergic reaction Negative to fire ant but positive control was negative questioning the validity of the results.  Not sure what caused this but concerning for either fire ant or stinging insects. For mild symptoms you can take over the counter antihistamines such as Benadryl and monitor symptoms closely. If symptoms worsen or if you have severe symptoms including breathing issues, throat closure, significant swelling, whole body hives, severe diarrhea and vomiting, lightheadedness then seek immediate medical care. Action plan given. Get bloodwork We are ordering labs, so please allow 1-2 weeks for the results to come back. With the newly implemented Cures Act, the labs might be visible to you at the same time that they become visible to me. However, I will not address the results until all of the results are back, so please be patient.  In the meantime, continue recommendations in your patient instructions, including avoidance measures (if applicable), until you hear from me.  Follow up in 12 months or sooner if needed.  Our [place] office is moving in September 2023 to a new location. New address: [address] [place], [place], [place] [address] (white building). [place] office: [phone] (same phone number).

## 2021-10-18 NOTE — Assessment & Plan Note (Signed)
Allergic reaction in the form of hypotension, diffuse hives, itchy palms and vomiting. Treated with epi, steroids, benadryl and albuterol. Denies changes in diet, meds, personal care products or recent infections. Questionable insect bite. Past medical history significant for diabetes, hypertension, CAD, TIA, seizure history. . Today's skin prick testing was negative to fire ant but positive control was negative questioning the validity of the results. . Not sure what caused this reaction - some concerns for insect allergy as this occurred outdoors.  . Get bloodwork as below.  . For mild symptoms you can take over the counter antihistamines such as Benadryl and monitor symptoms closely. If symptoms worsen or if you have severe symptoms including breathing issues, throat closure, significant swelling, whole body hives, severe diarrhea and vomiting, lightheadedness then seek immediate medical care. . Action plan given.

## 2021-10-22 LAB — ALLERGEN FIRE ANT: I070-IgE Fire Ant (Invicta): 0.53 kU/L — AB

## 2021-10-22 LAB — ALPHA-GAL PANEL
Allergen Lamb IgE: <0.1 kU/L
Beef IgE: <0.1 kU/L
IgE (Immunoglobulin E), Serum: 3283 IU/mL — ABNORMAL HIGH (ref 6–495)
O215-IgE Alpha-Gal: <0.1 kU/L
Pork IgE: <0.1 kU/L

## 2021-10-22 LAB — ALLERGEN HYMENOPTERA PANEL
Bumblebee: 0.32 kU/L — AB
Honeybee IgE: 0.53 kU/L — AB
Hornet, White Face, IgE: >100 kU/L — AB
Hornet, Yellow, IgE: 78.4 kU/L — AB
Paper Wasp IgE: >100 kU/L — AB
Yellow Jacket, IgE: >100 kU/L — AB

## 2021-10-22 LAB — CBC WITH DIFFERENTIAL/PLATELET
Basophils Absolute: 0 10*3/uL (ref 0.0–0.2)
Basos: 1 %
EOS (ABSOLUTE): 0.4 10*3/uL (ref 0.0–0.4)
Eos: 9 %
Hematocrit: 40.3 % (ref 37.5–51.0)
Hemoglobin: 13.8 g/dL (ref 13.0–17.7)
Immature Grans (Abs): 0 10*3/uL (ref 0.0–0.1)
Immature Granulocytes: 0 %
Lymphocytes Absolute: 0.9 10*3/uL (ref 0.7–3.1)
Lymphs: 20 %
MCH: 34.2 pg — ABNORMAL HIGH (ref 26.6–33.0)
MCHC: 34.2 g/dL (ref 31.5–35.7)
MCV: 100 fL — ABNORMAL HIGH (ref 79–97)
Monocytes Absolute: 0.5 10*3/uL (ref 0.1–0.9)
Monocytes: 11 %
Neutrophils Absolute: 2.8 10*3/uL (ref 1.4–7.0)
Neutrophils: 59 %
Platelets: 152 10*3/uL (ref 150–450)
RBC: 4.04 x10E6/uL — ABNORMAL LOW (ref 4.14–5.80)
RDW: 12.6 % (ref 11.6–15.4)
WBC: 4.7 10*3/uL (ref 3.4–10.8)

## 2021-10-22 LAB — C3 AND C4
Complement C3, Serum: 155 mg/dL (ref 82–167)
Complement C4, Serum: 29 mg/dL (ref 12–38)

## 2021-10-22 LAB — ANA W/REFLEX: Anti Nuclear Antibody (ANA): NEGATIVE

## 2021-10-22 LAB — C-REACTIVE PROTEIN: CRP: 5 mg/L (ref 0–10)

## 2021-10-22 LAB — SEDIMENTATION RATE: Sed Rate: 12 mm/hr (ref 0–30)

## 2021-10-22 LAB — TRYPTASE: Tryptase: 7.8 ug/L (ref 2.2–13.2)

## 2021-10-22 NOTE — Progress Notes (Signed)
Please call patient. Bloodwork was very positive to white face hornet, yellow jacket, wasp, yellow hornet.  Borderline positive to honeybee and fire ant. Rest of the blood work was unremarkable. Results concerning if your allergic reaction was due to one of the above insect stings.  Recommend venom immunotherapy to white face hornet, yellow jacket, yellow hornet and wasp - 2 shots total.

## 2021-12-05 ENCOUNTER — Telehealth: Payer: Self-pay | Admitting: Internal Medicine

## 2021-12-05 ENCOUNTER — Other Ambulatory Visit: Payer: Self-pay | Admitting: Internal Medicine

## 2021-12-05 MED ORDER — NITROGLYCERIN 0.4 MG SL SUBL: SUBLINGUAL_TABLET | SUBLINGUAL | 5 refills | Status: AC

## 2021-12-05 MED ORDER — ONETOUCH DELICA PLUS LANCET30G MISC: 0 refills | Status: DC

## 2021-12-05 NOTE — Telephone Encounter (Signed)
Refill ONETOUCH VERIO test strip, nitroGLYCERIN (NITROSTAT) 0.4 MG SL tablet [id number] [place], [place] - [address] Phone:  [phone] Fax:  [phone]

## 2021-12-05 NOTE — Telephone Encounter (Signed)
Refill sent.

## 2021-12-06 MED ORDER — ONETOUCH VERIO VI STRP: ORAL_STRIP | 3 refills | Status: DC

## 2021-12-06 NOTE — Addendum Note (Signed)
Addended by: Westley Hummer B on: 12/06/2021 11:22 AM   Modules accepted: Orders

## 2021-12-06 NOTE — Telephone Encounter (Signed)
Refill sent.

## 2021-12-06 NOTE — Telephone Encounter (Signed)
Pt called to inform MD that he received  Lancets (ONETOUCH DELICA PLUS [id number]) MISC  Instead of the:  [facility] VERIO test strip, which is what he requested.  [id number] [place], [place] - [address] Phone:  [phone] Fax:  [phone]     Pt would like this rectified as soon as possible, as he only has one left.  Please advise.

## 2021-12-10 ENCOUNTER — Other Ambulatory Visit: Payer: Self-pay | Admitting: Internal Medicine

## 2022-01-17 ENCOUNTER — Other Ambulatory Visit: Payer: Self-pay | Admitting: Internal Medicine

## 2022-02-09 ENCOUNTER — Other Ambulatory Visit: Payer: Self-pay | Admitting: Internal Medicine

## 2022-02-12 LAB — PSA: PSA: 0.78

## 2022-02-20 ENCOUNTER — Inpatient Hospital Stay (HOSPITAL_COMMUNITY)
Admission: EM | Admit: 2022-02-20 | Discharge: 2022-02-25 | DRG: 682 | Disposition: A | Discharge status: Home / Self Care | Payer: Medicare Other | Attending: Internal Medicine | Admitting: Internal Medicine

## 2022-02-20 ENCOUNTER — Emergency Department (HOSPITAL_COMMUNITY): Payer: Medicare Other

## 2022-02-20 ENCOUNTER — Inpatient Hospital Stay (HOSPITAL_COMMUNITY): Payer: Medicare Other

## 2022-02-20 ENCOUNTER — Other Ambulatory Visit: Payer: Self-pay

## 2022-02-20 DIAGNOSIS — Z6830 Body mass index (BMI) 30.0-30.9, adult: Secondary | ICD-10-CM | POA: Diagnosis not present

## 2022-02-20 DIAGNOSIS — E872 Acidosis, unspecified: Secondary | ICD-10-CM

## 2022-02-20 DIAGNOSIS — R571 Hypovolemic shock: Secondary | ICD-10-CM | POA: Diagnosis present

## 2022-02-20 DIAGNOSIS — E875 Hyperkalemia: Secondary | ICD-10-CM

## 2022-02-20 DIAGNOSIS — G9341 Metabolic encephalopathy: Secondary | ICD-10-CM | POA: Diagnosis present

## 2022-02-20 DIAGNOSIS — N400 Enlarged prostate without lower urinary tract symptoms: Secondary | ICD-10-CM | POA: Diagnosis present

## 2022-02-20 DIAGNOSIS — N179 Acute kidney failure, unspecified: Principal | ICD-10-CM | POA: Diagnosis present

## 2022-02-20 DIAGNOSIS — I452 Bifascicular block: Secondary | ICD-10-CM | POA: Diagnosis present

## 2022-02-20 DIAGNOSIS — Z8673 Personal history of transient ischemic attack (TIA), and cerebral infarction without residual deficits: Secondary | ICD-10-CM

## 2022-02-20 DIAGNOSIS — D696 Thrombocytopenia, unspecified: Secondary | ICD-10-CM | POA: Diagnosis present

## 2022-02-20 DIAGNOSIS — R579 Shock, unspecified: Secondary | ICD-10-CM | POA: Diagnosis not present

## 2022-02-20 DIAGNOSIS — I10 Essential (primary) hypertension: Secondary | ICD-10-CM | POA: Diagnosis present

## 2022-02-20 DIAGNOSIS — Z87891 Personal history of nicotine dependence: Secondary | ICD-10-CM

## 2022-02-20 DIAGNOSIS — Z1152 Encounter for screening for COVID-19: Secondary | ICD-10-CM

## 2022-02-20 DIAGNOSIS — F419 Anxiety disorder, unspecified: Secondary | ICD-10-CM | POA: Diagnosis present

## 2022-02-20 DIAGNOSIS — D539 Nutritional anemia, unspecified: Secondary | ICD-10-CM | POA: Diagnosis present

## 2022-02-20 DIAGNOSIS — Z7984 Long term (current) use of oral hypoglycemic drugs: Secondary | ICD-10-CM

## 2022-02-20 DIAGNOSIS — Z801 Family history of malignant neoplasm of trachea, bronchus and lung: Secondary | ICD-10-CM

## 2022-02-20 DIAGNOSIS — E669 Obesity, unspecified: Secondary | ICD-10-CM | POA: Diagnosis present

## 2022-02-20 DIAGNOSIS — E119 Type 2 diabetes mellitus without complications: Secondary | ICD-10-CM | POA: Diagnosis not present

## 2022-02-20 DIAGNOSIS — Z808 Family history of malignant neoplasm of other organs or systems: Secondary | ICD-10-CM

## 2022-02-20 DIAGNOSIS — T383X5A Adverse effect of insulin and oral hypoglycemic [antidiabetic] drugs, initial encounter: Secondary | ICD-10-CM | POA: Diagnosis present

## 2022-02-20 DIAGNOSIS — G40909 Epilepsy, unspecified, not intractable, without status epilepticus: Secondary | ICD-10-CM | POA: Diagnosis present

## 2022-02-20 DIAGNOSIS — Z7982 Long term (current) use of aspirin: Secondary | ICD-10-CM

## 2022-02-20 DIAGNOSIS — Z825 Family history of asthma and other chronic lower respiratory diseases: Secondary | ICD-10-CM

## 2022-02-20 DIAGNOSIS — I2510 Atherosclerotic heart disease of native coronary artery without angina pectoris: Secondary | ICD-10-CM | POA: Diagnosis present

## 2022-02-20 DIAGNOSIS — Z85828 Personal history of other malignant neoplasm of skin: Secondary | ICD-10-CM | POA: Diagnosis not present

## 2022-02-20 DIAGNOSIS — E1165 Type 2 diabetes mellitus with hyperglycemia: Secondary | ICD-10-CM | POA: Diagnosis not present

## 2022-02-20 DIAGNOSIS — E8721 Acute metabolic acidosis: Secondary | ICD-10-CM | POA: Diagnosis not present

## 2022-02-20 DIAGNOSIS — E785 Hyperlipidemia, unspecified: Secondary | ICD-10-CM | POA: Diagnosis present

## 2022-02-20 DIAGNOSIS — Z83438 Family history of other disorder of lipoprotein metabolism and other lipidemia: Secondary | ICD-10-CM

## 2022-02-20 DIAGNOSIS — I252 Old myocardial infarction: Secondary | ICD-10-CM | POA: Diagnosis not present

## 2022-02-20 DIAGNOSIS — E1151 Type 2 diabetes mellitus with diabetic peripheral angiopathy without gangrene: Secondary | ICD-10-CM

## 2022-02-20 DIAGNOSIS — Z8249 Family history of ischemic heart disease and other diseases of the circulatory system: Secondary | ICD-10-CM

## 2022-02-20 DIAGNOSIS — K219 Gastro-esophageal reflux disease without esophagitis: Secondary | ICD-10-CM | POA: Diagnosis present

## 2022-02-20 DIAGNOSIS — Z955 Presence of coronary angioplasty implant and graft: Secondary | ICD-10-CM

## 2022-02-20 DIAGNOSIS — Z833 Family history of diabetes mellitus: Secondary | ICD-10-CM

## 2022-02-20 DIAGNOSIS — Z96652 Presence of left artificial knee joint: Secondary | ICD-10-CM | POA: Diagnosis present

## 2022-02-20 DIAGNOSIS — Z79899 Other long term (current) drug therapy: Secondary | ICD-10-CM

## 2022-02-20 LAB — COMPREHENSIVE METABOLIC PANEL
ALT: 44 U/L (ref 0–44)
AST: 45 U/L — ABNORMAL HIGH (ref 15–41)
Albumin: 2.9 g/dL — ABNORMAL LOW (ref 3.5–5.0)
Alkaline Phosphatase: 45 U/L (ref 38–126)
BUN: 73 mg/dL — ABNORMAL HIGH (ref 8–23)
CO2: <7 mmol/L — ABNORMAL LOW (ref 22–32)
Calcium: 8 mg/dL — ABNORMAL LOW (ref 8.9–10.3)
Chloride: 96 mmol/L — ABNORMAL LOW (ref 98–111)
Creatinine, Ser: 10.95 mg/dL — ABNORMAL HIGH (ref 0.61–1.24)
GFR, Estimated: 5 mL/min — ABNORMAL LOW (ref >60)
Glucose, Bld: 143 mg/dL — ABNORMAL HIGH (ref 70–99)
Potassium: 7.3 mmol/L (ref 3.5–5.1)
Sodium: 140 mmol/L (ref 135–145)
Total Bilirubin: 0.9 mg/dL (ref 0.3–1.2)
Total Protein: 5.1 g/dL — ABNORMAL LOW (ref 6.5–8.1)

## 2022-02-20 LAB — BASIC METABOLIC PANEL
BUN: 69 mg/dL — ABNORMAL HIGH (ref 8–23)
BUN: 71 mg/dL — ABNORMAL HIGH (ref 8–23)
BUN: 69 mg/dL — ABNORMAL HIGH (ref 8–23)
CO2: <7 mmol/L — ABNORMAL LOW (ref 22–32)
CO2: <7 mmol/L — ABNORMAL LOW (ref 22–32)
CO2: <7 mmol/L — ABNORMAL LOW (ref 22–32)
Calcium: 7.6 mg/dL — ABNORMAL LOW (ref 8.9–10.3)
Calcium: 7.5 mg/dL — ABNORMAL LOW (ref 8.9–10.3)
Calcium: 7.4 mg/dL — ABNORMAL LOW (ref 8.9–10.3)
Chloride: 96 mmol/L — ABNORMAL LOW (ref 98–111)
Chloride: 93 mmol/L — ABNORMAL LOW (ref 98–111)
Chloride: 92 mmol/L — ABNORMAL LOW (ref 98–111)
Creatinine, Ser: 10.24 mg/dL — ABNORMAL HIGH (ref 0.61–1.24)
Creatinine, Ser: 10.13 mg/dL — ABNORMAL HIGH (ref 0.61–1.24)
Creatinine, Ser: 9.84 mg/dL — ABNORMAL HIGH (ref 0.61–1.24)
GFR, Estimated: 5 mL/min — ABNORMAL LOW (ref >60)
GFR, Estimated: 5 mL/min — ABNORMAL LOW (ref >60)
GFR, Estimated: 5 mL/min — ABNORMAL LOW (ref >60)
Glucose, Bld: 232 mg/dL — ABNORMAL HIGH (ref 70–99)
Glucose, Bld: 338 mg/dL — ABNORMAL HIGH (ref 70–99)
Glucose, Bld: 307 mg/dL — ABNORMAL HIGH (ref 70–99)
Potassium: 6.7 mmol/L (ref 3.5–5.1)
Potassium: 6.6 mmol/L (ref 3.5–5.1)
Potassium: 6 mmol/L — ABNORMAL HIGH (ref 3.5–5.1)
Sodium: 140 mmol/L (ref 135–145)
Sodium: 138 mmol/L (ref 135–145)
Sodium: 138 mmol/L (ref 135–145)

## 2022-02-20 LAB — CBC WITH DIFFERENTIAL/PLATELET
Abs Immature Granulocytes: 0.15 10*3/uL — ABNORMAL HIGH (ref 0.00–0.07)
Basophils Absolute: 0 10*3/uL (ref 0.0–0.1)
Basophils Relative: 0 %
Eosinophils Absolute: 0 10*3/uL (ref 0.0–0.5)
Eosinophils Relative: 0 %
HCT: 32 % — ABNORMAL LOW (ref 39.0–52.0)
Hemoglobin: 9.7 g/dL — ABNORMAL LOW (ref 13.0–17.0)
Immature Granulocytes: 2 %
Lymphocytes Relative: 26 %
Lymphs Abs: 1.9 10*3/uL (ref 0.7–4.0)
MCH: 35.3 pg — ABNORMAL HIGH (ref 26.0–34.0)
MCHC: 30.3 g/dL (ref 30.0–36.0)
MCV: 116.4 fL — ABNORMAL HIGH (ref 80.0–100.0)
Monocytes Absolute: 1 10*3/uL (ref 0.1–1.0)
Monocytes Relative: 14 %
Neutro Abs: 4.4 10*3/uL (ref 1.7–7.7)
Neutrophils Relative %: 58 %
Platelets: 141 10*3/uL — ABNORMAL LOW (ref 150–400)
RBC: 2.75 MIL/uL — ABNORMAL LOW (ref 4.22–5.81)
RDW: 14 % (ref 11.5–15.5)
WBC: 7.5 10*3/uL (ref 4.0–10.5)
nRBC: 0 % (ref 0.0–0.2)

## 2022-02-20 LAB — RESP PANEL BY RT-PCR (RSV, FLU A&B, COVID)  RVPGX2
Influenza A by PCR: NEGATIVE
Influenza B by PCR: NEGATIVE
Resp Syncytial Virus by PCR: NEGATIVE
SARS Coronavirus 2 by RT PCR: NEGATIVE

## 2022-02-20 LAB — ABO/RH: ABO/RH(D): O POS

## 2022-02-20 LAB — POCT I-STAT 7, (LYTES, BLD GAS, ICA,H+H)
Acid-base deficit: 17 mmol/L — ABNORMAL HIGH (ref 0.0–2.0)
Acid-base deficit: 24 mmol/L — ABNORMAL HIGH (ref 0.0–2.0)
Bicarbonate: 4.5 mmol/L — ABNORMAL LOW (ref 20.0–28.0)
Bicarbonate: 7.7 mmol/L — ABNORMAL LOW (ref 20.0–28.0)
Calcium, Ion: 0.92 mmol/L — ABNORMAL LOW (ref 1.15–1.40)
Calcium, Ion: 0.94 mmol/L — ABNORMAL LOW (ref 1.15–1.40)
HCT: 30 % — ABNORMAL LOW (ref 39.0–52.0)
HCT: 30 % — ABNORMAL LOW (ref 39.0–52.0)
Hemoglobin: 10.2 g/dL — ABNORMAL LOW (ref 13.0–17.0)
Hemoglobin: 10.2 g/dL — ABNORMAL LOW (ref 13.0–17.0)
O2 Saturation: 99 %
O2 Saturation: 99 %
Patient temperature: 97.6
Potassium: 4.6 mmol/L (ref 3.5–5.1)
Potassium: 5.8 mmol/L — ABNORMAL HIGH (ref 3.5–5.1)
Sodium: 131 mmol/L — ABNORMAL LOW (ref 135–145)
Sodium: 136 mmol/L (ref 135–145)
TCO2: 8 mmol/L — ABNORMAL LOW (ref 22–32)
TCO2: <5 mmol/L — ABNORMAL LOW (ref 22–32)
pCO2 arterial: 15.7 mmHg — CL (ref 32–48)
pCO2 arterial: 16.5 mmHg — CL (ref 32–48)
pH, Arterial: 7.064 — CL (ref 7.35–7.45)
pH, Arterial: 7.274 — ABNORMAL LOW (ref 7.35–7.45)
pO2, Arterial: 124 mmHg — ABNORMAL HIGH (ref 83–108)
pO2, Arterial: 164 mmHg — ABNORMAL HIGH (ref 83–108)

## 2022-02-20 LAB — CBC
HCT: 36.5 % — ABNORMAL LOW (ref 39.0–52.0)
Hemoglobin: 10.7 g/dL — ABNORMAL LOW (ref 13.0–17.0)
MCH: 35.7 pg — ABNORMAL HIGH (ref 26.0–34.0)
MCHC: 29.3 g/dL — ABNORMAL LOW (ref 30.0–36.0)
MCV: 121.7 fL — ABNORMAL HIGH (ref 80.0–100.0)
Platelets: 139 10*3/uL — ABNORMAL LOW (ref 150–400)
RBC: 3 MIL/uL — ABNORMAL LOW (ref 4.22–5.81)
RDW: 14 % (ref 11.5–15.5)
WBC: 8.1 10*3/uL (ref 4.0–10.5)
nRBC: 0 % (ref 0.0–0.2)

## 2022-02-20 LAB — I-STAT CHEM 8, ED
BUN: 81 mg/dL — ABNORMAL HIGH (ref 8–23)
Calcium, Ion: 0.92 mmol/L — ABNORMAL LOW (ref 1.15–1.40)
Chloride: 103 mmol/L (ref 98–111)
Creatinine, Ser: 11.7 mg/dL — ABNORMAL HIGH (ref 0.61–1.24)
Glucose, Bld: 142 mg/dL — ABNORMAL HIGH (ref 70–99)
HCT: 26 % — ABNORMAL LOW (ref 39.0–52.0)
Hemoglobin: 8.8 g/dL — ABNORMAL LOW (ref 13.0–17.0)
Potassium: 7 mmol/L (ref 3.5–5.1)
Sodium: 134 mmol/L — ABNORMAL LOW (ref 135–145)
TCO2: 5 mmol/L — ABNORMAL LOW (ref 22–32)

## 2022-02-20 LAB — GLUCOSE, CAPILLARY
Glucose-Capillary: 305 mg/dL — ABNORMAL HIGH (ref 70–99)
Glucose-Capillary: 326 mg/dL — ABNORMAL HIGH (ref 70–99)
Glucose-Capillary: 172 mg/dL — ABNORMAL HIGH (ref 70–99)

## 2022-02-20 LAB — TYPE AND SCREEN
ABO/RH(D): O POS
Antibody Screen: NEGATIVE

## 2022-02-20 LAB — LACTIC ACID, PLASMA
Lactic Acid, Venous: >9 mmol/L (ref 0.5–1.9)
Lactic Acid, Venous: >9 mmol/L (ref 0.5–1.9)

## 2022-02-20 LAB — TROPONIN I (HIGH SENSITIVITY)
Troponin I (High Sensitivity): 15 ng/L (ref <18)
Troponin I (High Sensitivity): 14 ng/L (ref <18)

## 2022-02-20 LAB — HIV ANTIBODY (ROUTINE TESTING W REFLEX): HIV Screen 4th Generation wRfx: NONREACTIVE

## 2022-02-20 LAB — MRSA NEXT GEN BY PCR, NASAL: MRSA by PCR Next Gen: NOT DETECTED

## 2022-02-20 MED ORDER — HEPARIN SODIUM (PORCINE) 1000 UNIT/ML IJ SOLN
INTRAMUSCULAR | Status: AC
  Filled 2022-02-20: qty 2

## 2022-02-20 MED ORDER — INSULIN ASPART 100 UNIT/ML IV SOLN
5.0000 [IU] | Freq: Once | INTRAVENOUS | Status: AC
  Administered 2022-02-20: 5 [IU] via INTRAVENOUS

## 2022-02-20 MED ORDER — INSULIN ASPART 100 UNIT/ML IJ SOLN
0.0000 [IU] | INTRAMUSCULAR | Status: DC
  Administered 2022-02-20 (×2): 11 [IU] via SUBCUTANEOUS
  Administered 2022-02-20: 3 [IU] via SUBCUTANEOUS
  Administered 2022-02-21: 2 [IU] via SUBCUTANEOUS
  Administered 2022-02-21: 3 [IU] via SUBCUTANEOUS
  Administered 2022-02-21 – 2022-02-22 (×2): 2 [IU] via SUBCUTANEOUS

## 2022-02-20 MED ORDER — SODIUM CHLORIDE 0.9 % IV SOLN: 1.0000 g | INTRAVENOUS | Status: DC

## 2022-02-20 MED ORDER — NOREPINEPHRINE 4 MG/250ML-% IV SOLN
0.0000 ug/min | INTRAVENOUS | Status: DC
  Administered 2022-02-20: 2 ug/min via INTRAVENOUS
  Administered 2022-02-20 (×2): 40 ug/min via INTRAVENOUS
  Filled 2022-02-20 (×4): qty 250

## 2022-02-20 MED ORDER — HEPARIN SODIUM (PORCINE) 5000 UNIT/ML IJ SOLN
5000.0000 [IU] | Freq: Three times a day (TID) | INTRAMUSCULAR | Status: DC
  Administered 2022-02-20 – 2022-02-25 (×14): 5000 [IU] via SUBCUTANEOUS
  Filled 2022-02-20 (×13): qty 1

## 2022-02-20 MED ORDER — INSULIN ASPART 100 UNIT/ML IJ SOLN: 1.0000 [IU] | INTRAMUSCULAR | Status: DC

## 2022-02-20 MED ORDER — SODIUM ZIRCONIUM CYCLOSILICATE 10 G PO PACK
10.0000 g | PACK | Freq: Once | ORAL | Status: AC
  Administered 2022-02-20: 10 g via ORAL

## 2022-02-20 MED ORDER — IOHEXOL 350 MG/ML SOLN
100.0000 mL | Freq: Once | INTRAVENOUS | Status: AC | PRN
  Administered 2022-02-20: 100 mL via INTRAVENOUS

## 2022-02-20 MED ORDER — PHENOL 1.4 % MT LIQD
1.0000 | OROMUCOSAL | Status: DC | PRN
  Administered 2022-02-20 – 2022-02-21 (×2): 1 via OROMUCOSAL
  Filled 2022-02-20: qty 177

## 2022-02-20 MED ORDER — CHLORHEXIDINE GLUCONATE CLOTH 2 % EX PADS: 6.0000 | MEDICATED_PAD | Freq: Every day | CUTANEOUS | Status: DC

## 2022-02-20 MED ORDER — PRISMASOL BGK 0/2.5 32-2.5 MEQ/L EC SOLN
Status: DC
  Filled 2022-02-20 (×3): qty 5000

## 2022-02-20 MED ORDER — SODIUM CHLORIDE 0.9 % IV BOLUS
1000.0000 mL | Freq: Once | INTRAVENOUS | Status: AC
  Administered 2022-02-20: 1000 mL via INTRAVENOUS

## 2022-02-20 MED ORDER — POLYETHYLENE GLYCOL 3350 17 G PO PACK: 17.0000 g | PACK | Freq: Every day | ORAL | Status: DC | PRN

## 2022-02-20 MED ORDER — CALCIUM GLUCONATE 10 % IV SOLN
1.0000 g | Freq: Once | INTRAVENOUS | Status: AC
  Administered 2022-02-20: 1 g via INTRAVENOUS
  Filled 2022-02-20: qty 10

## 2022-02-20 MED ORDER — SODIUM CHLORIDE 0.9 % IV SOLN: INTRAVENOUS | Status: DC | PRN

## 2022-02-20 MED ORDER — CALCIUM GLUCONATE-NACL 2-0.675 GM/100ML-% IV SOLN
2.0000 g | Freq: Once | INTRAVENOUS | Status: AC
  Administered 2022-02-20: 2000 mg via INTRAVENOUS
  Filled 2022-02-20 (×2): qty 100

## 2022-02-20 MED ORDER — FAMOTIDINE 20 MG PO TABS: 20.0000 mg | ORAL_TABLET | Freq: Two times a day (BID) | ORAL | Status: DC

## 2022-02-20 MED ORDER — ALBUTEROL SULFATE (2.5 MG/3ML) 0.083% IN NEBU
10.0000 mg | INHALATION_SOLUTION | Freq: Once | RESPIRATORY_TRACT | Status: AC
  Administered 2022-02-20: 10 mg via RESPIRATORY_TRACT
  Filled 2022-02-20: qty 12

## 2022-02-20 MED ORDER — CHLORHEXIDINE GLUCONATE CLOTH 2 % EX PADS
6.0000 | MEDICATED_PAD | Freq: Every day | CUTANEOUS | Status: DC
  Administered 2022-02-20 – 2022-02-25 (×6): 6 via TOPICAL

## 2022-02-20 MED ORDER — SODIUM BICARBONATE 8.4 % IV SOLN
50.0000 meq | Freq: Once | INTRAVENOUS | Status: AC
  Administered 2022-02-20: 50 meq via INTRAVENOUS
  Filled 2022-02-20: qty 50

## 2022-02-20 MED ORDER — HEPARIN SODIUM (PORCINE) 1000 UNIT/ML IJ SOLN
1.2000 mL | Freq: Once | INTRAMUSCULAR | Status: AC
  Administered 2022-02-20: 1200 [IU]

## 2022-02-20 MED ORDER — ASPIRIN 81 MG PO CHEW: 324.0000 mg | CHEWABLE_TABLET | Freq: Once | ORAL | Status: DC

## 2022-02-20 MED ORDER — LACTATED RINGERS IV BOLUS
1000.0000 mL | Freq: Once | INTRAVENOUS | Status: AC
  Administered 2022-02-20: 1000 mL via INTRAVENOUS

## 2022-02-20 MED ORDER — DOCUSATE SODIUM 100 MG PO CAPS: 100.0000 mg | ORAL_CAPSULE | Freq: Two times a day (BID) | ORAL | Status: DC | PRN

## 2022-02-20 MED ORDER — SODIUM BICARBONATE 8.4 % IV SOLN
200.0000 meq | Freq: Once | INTRAVENOUS | Status: AC
  Administered 2022-02-20: 200 meq via INTRAVENOUS

## 2022-02-20 MED ORDER — SODIUM BICARBONATE 8.4 % IV SOLN
INTRAVENOUS | Status: DC
  Filled 2022-02-20 (×2): qty 1000

## 2022-02-20 MED ORDER — SODIUM CHLORIDE 0.9 % IV SOLN: INTRAVENOUS | Status: DC

## 2022-02-20 MED ORDER — SODIUM CHLORIDE 0.9 % IV SOLN
2.0000 g | Freq: Two times a day (BID) | INTRAVENOUS | Status: DC
  Administered 2022-02-21: 2 g via INTRAVENOUS
  Filled 2022-02-20: qty 12.5

## 2022-02-20 MED ORDER — FENTANYL CITRATE PF 50 MCG/ML IJ SOSY
50.0000 ug | PREFILLED_SYRINGE | Freq: Once | INTRAMUSCULAR | Status: AC
  Administered 2022-02-20: 50 ug via INTRAVENOUS
  Filled 2022-02-20: qty 1

## 2022-02-20 MED ORDER — SODIUM CHLORIDE 0.9 % IV SOLN
2.0000 g | Freq: Once | INTRAVENOUS | Status: AC
  Administered 2022-02-20: 2 g via INTRAVENOUS
  Filled 2022-02-20: qty 12.5

## 2022-02-20 MED ORDER — SODIUM CHLORIDE 0.9 % IV SOLN
100.0000 mg | Freq: Two times a day (BID) | INTRAVENOUS | Status: DC
  Administered 2022-02-20 – 2022-02-21 (×2): 100 mg via INTRAVENOUS
  Filled 2022-02-20 (×4): qty 100

## 2022-02-20 MED ORDER — SODIUM ZIRCONIUM CYCLOSILICATE 10 G PO PACK
10.0000 g | PACK | Freq: Three times a day (TID) | ORAL | Status: DC
  Administered 2022-02-20 (×2): 10 g via ORAL
  Filled 2022-02-20 (×2): qty 1

## 2022-02-20 MED ORDER — VASOPRESSIN 20 UNITS/100 ML INFUSION FOR SHOCK
0.0000 [IU]/min | INTRAVENOUS | Status: DC
  Administered 2022-02-20 (×2): 0.03 [IU]/min via INTRAVENOUS
  Administered 2022-02-21: 0.01 [IU]/min via INTRAVENOUS
  Filled 2022-02-20 (×3): qty 100

## 2022-02-20 MED ORDER — SODIUM BICARBONATE 8.4 % IV SOLN
100.0000 meq | Freq: Once | INTRAVENOUS | Status: AC
  Administered 2022-02-20: 100 meq via INTRAVENOUS
  Filled 2022-02-20: qty 100

## 2022-02-20 MED ORDER — SODIUM CHLORIDE 0.9 % FOR CRRT: INTRAVENOUS_CENTRAL | Status: DC | PRN

## 2022-02-20 MED ORDER — SODIUM BICARBONATE 8.4 % IV SOLN
INTRAVENOUS | Status: AC
  Filled 2022-02-20: qty 200

## 2022-02-20 MED ORDER — PRISMASOL BGK 0/2.5 32-2.5 MEQ/L EC SOLN
Status: DC
  Filled 2022-02-20 (×12): qty 5000

## 2022-02-20 MED ORDER — SODIUM ZIRCONIUM CYCLOSILICATE 10 G PO PACK
10.0000 g | PACK | Freq: Once | ORAL | Status: DC
  Filled 2022-02-20: qty 1

## 2022-02-20 MED ORDER — STERILE WATER FOR INJECTION IV SOLN
INTRAVENOUS | Status: DC
  Filled 2022-02-20 (×3): qty 1000

## 2022-02-20 MED ORDER — DEXTROSE 50 % IV SOLN
1.0000 | Freq: Once | INTRAVENOUS | Status: AC
  Administered 2022-02-20: 50 mL via INTRAVENOUS
  Filled 2022-02-20: qty 50

## 2022-02-20 MED ORDER — NOREPINEPHRINE 16 MG/250ML-% IV SOLN
0.0000 ug/min | INTRAVENOUS | Status: DC
  Administered 2022-02-20: 40 ug/min via INTRAVENOUS
  Administered 2022-02-21: 26 ug/min via INTRAVENOUS
  Filled 2022-02-20 (×3): qty 250

## 2022-02-20 MED ORDER — HEPARIN SODIUM (PORCINE) 1000 UNIT/ML DIALYSIS: 1000.0000 [IU] | INTRAMUSCULAR | Status: DC | PRN

## 2022-02-20 MED ORDER — LACTATED RINGERS IV SOLN: INTRAVENOUS | Status: DC

## 2022-02-20 NOTE — ED Notes (Addendum)
NP Hoffman at bedside; NP. Hoffman aware of current levophed rate

## 2022-02-20 NOTE — ED Notes (Signed)
Lab results was given to EDP.

## 2022-02-20 NOTE — Consult Note (Signed)
[facility]  HISTORY AND PHYSICAL  Jose [name] is an 71 y.o. male.    Chief Complaint: n/v, feeling weak  HPI: Pt is a 62M with a PMH Sig for HTN, HLD, DM II, h/o CAD who is now seen in consultation at the request of Dr. Carlis Abbott for eval and recs re: AKI and hyperkalemia.    Pt was in his usual state of health until last Friday when he started having n/v.  Has chronic diarrhea but this was worse.  Couldn't keep anything down btu did take his meds.  Included in that list are benazepril and metformin.  No SGLT2i it appears.    Was having chest, back, abd pain and was diaphoretic today.  Came to ED.  Suspicion was for dissection.  CTA didn't show dissection.  Noted no hydro either.  Hypotensive.  Baseline Cr appears to be 1.30, now is up to 10.95 with K of 7.3.  Has gotten all aggressive temporizing measures and is going to be started on bicarb gtt soon.  Is getting fluid resuscitated and is on levophed.  On broad spectrum antitbiotics with cefepime and doxy.    In this setting we are asked to see.  Pt reports back and abd pain.  Feels jittery and anxious (finishing up albuterol).    PMH: Past Medical History:  Diagnosis Date   ALCOHOL ABUSE 07/27/2008   Allergy    ANXIETY 07/27/2008   Arthritis    Bifascicular block    beta blocker stopped due to profound bradycardia   CAD 07/27/2008   a. s/p Endeavor DES to CFX 07/2008; residual OM1 70%, EF 45%;  b. relook cath 5/10: patent stent in CFX;  c.  LHC (8/15):  prox LAD 30%, OM1 50% at bifurcation, OM1 sub-branch 50%, mid AVCFX stent patent, EF 55-65% - no change from 2010 >>> Med Rx (after abnormal nuc)    Cataract    Diabetes mellitus without complication ([place])    GERD 09/18/2006   History of TIA (transient ischemic attack) 07/2008   Hx of adenomatous colonic polyps    Hx of cardiovascular stress test    ETT-Myoview (09/2013):  Partially fixed defect with some reversibility - cannot r/o inf ischemia vs diaph atten, EF 51%; mod  risk   Hyperlipidemia    HYPERTENSION 09/18/2006   LOW BACK PAIN 09/18/2006   Myocardial infarction ([place]) 2010   Numbness and tingling    finger tips at times   Rosacea 07/27/2008   SEIZURE DISORDER 09/18/2006   none since 2008   Squamous cell carcinoma of skin 07/19/2021   in situ - right nasal sidewall   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   Urinary tract infection start cipro 01-07-2012   PSH: Past Surgical History:  Procedure Laterality Date   CORONARY STENT PLACEMENT     drug eluting   GOLD SEED IMPLANT N/A 04/11/2021   Procedure: GOLD SEED IMPLANT SPACE OAR;  Surgeon: Robley Fries, MD;  Location: [facility];  Service: Urology;  Laterality: N/A;   HERNIA REPAIR     INGUINAL HERNIA REPAIR  01/14/2012   Procedure: LAPAROSCOPIC INGUINAL HERNIA;  Surgeon: Ralene Ok, MD;  Location: [facility];  Service: General;  Laterality: Right;   INSERTION OF MESH  01/14/2012   Procedure: INSERTION OF MESH;  Surgeon: Ralene Ok, MD;  Location: [facility];  Service: General;  Laterality: Right;   KNEE SURGERY     LEFT HEART CATHETERIZATION WITH CORONARY ANGIOGRAM N/A 10/21/2013   Procedure: LEFT  HEART CATHETERIZATION WITH CORONARY ANGIOGRAM;  Surgeon: Blane Ohara, MD;  Location: [facility] CATH LAB;  Service: Cardiovascular;  Laterality: N/A;   MEDIAL PARTIAL KNEE REPLACEMENT Left      Past Medical History:  Diagnosis Date   ALCOHOL ABUSE 07/27/2008   Allergy    ANXIETY 07/27/2008   Arthritis    Bifascicular block    beta blocker stopped due to profound bradycardia   CAD 07/27/2008   a. s/p Endeavor DES to CFX 07/2008; residual OM1 70%, EF 45%;  b. relook cath 5/10: patent stent in CFX;  c.  LHC (8/15):  prox LAD 30%, OM1 50% at bifurcation, OM1 sub-branch 50%, mid AVCFX stent patent, EF 55-65% - no change from 2010 >>> Med Rx (after abnormal nuc)    Cataract    Diabetes mellitus without complication ([place])    GERD 09/18/2006   History of TIA (transient ischemic attack)  07/2008   Hx of adenomatous colonic polyps    Hx of cardiovascular stress test    ETT-Myoview (09/2013):  Partially fixed defect with some reversibility - cannot r/o inf ischemia vs diaph atten, EF 51%; mod risk   Hyperlipidemia    HYPERTENSION 09/18/2006   LOW BACK PAIN 09/18/2006   Myocardial infarction ([place]) 2010   Numbness and tingling    finger tips at times   Rosacea 07/27/2008   SEIZURE DISORDER 09/18/2006   none since 2008   Squamous cell carcinoma of skin 07/19/2021   in situ - right nasal sidewall   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   Urinary tract infection start cipro 01-07-2012    Medications:  Scheduled: Reviewed in Epic Include benazepril 40 mg daily Metformin 1000 mg BID   ALLERGIES:  No Known Allergies  FAM HX: Family History  Problem Relation Age of Onset   Lung cancer Mother    Hypertension Mother    Diabetes Mother    Hyperlipidemia Mother    Emphysema Father    Heart failure Father    Melanoma Sister    Melanoma Paternal Grandmother    Heart attack Other    Colon cancer Neg Hx    Stroke Neg Hx    Esophageal cancer Neg Hx    Liver cancer Neg Hx    Pancreatic cancer Neg Hx    Stomach cancer Neg Hx    Rectal cancer Neg Hx     Social History:   reports that he has never smoked. He quit smokeless tobacco use about 10 years ago.  His smokeless tobacco use included chew. He reports that he does not drink alcohol and does not use drugs.  ROS: ROS: all other systems reviewed and are negative except as per HPI  Blood pressure (!) 83/40, pulse 79, temperature 98.1 F (36.7 C), resp. rate (!) 29, SpO2 99 %. PHYSICAL EXAM: Physical Exam GEN pale and diaphoretic HEENT EOMI PERRL NECK no JVD, flatneck veins PULM tachypneic CV tachycardic ABD mildly diffusely tender EXT no LE edema NEURO AAO x 3 nonfocal SKIN pale  Results for orders placed or performed during the hospital encounter of 02/20/22 (from the past 48 hour(s))  Troponin I (High  Sensitivity)     Status: None   Collection Time: 02/20/22 10:43 AM  Result Value Ref Range   Troponin I (High Sensitivity) 15 <18 ng/L    Comment: (NOTE) Elevated high sensitivity troponin I (hsTnI) values and significant  changes across serial measurements may suggest ACS but many other  chronic and acute conditions are known to elevate hsTnI  results.  Refer to the "Links" section for chest pain algorithms and additional  guidance. Performed at [facility], [place] [address] [place], [place] [address]   CBC     Status: Abnormal   Collection Time: 02/20/22 10:43 AM  Result Value Ref Range   WBC 8.1 4.0 - 10.5 K/uL   RBC 3.00 (L) 4.22 - 5.81 MIL/uL   Hemoglobin 10.7 (L) 13.0 - 17.0 g/dL   HCT 36.5 (L) 39.0 - 52.0 %   MCV 121.7 (H) 80.0 - 100.0 fL   MCH 35.7 (H) 26.0 - 34.0 pg   MCHC 29.3 (L) 30.0 - 36.0 g/dL   RDW 14.0 11.5 - 15.5 %   Platelets 139 (L) 150 - 400 K/uL   nRBC 0.0 0.0 - 0.2 %    Comment: Performed at [facility], [place] [address] [place], [place] [address]  Comprehensive metabolic panel     Status: Abnormal   Collection Time: 02/20/22 10:43 AM  Result Value Ref Range   Sodium 140 135 - 145 mmol/L   Potassium 7.3 (HH) 3.5 - 5.1 mmol/L    Comment: CRITICAL RESULT CALLED TO, READ BACK BY AND VERIFIED WITH HANNIE VAN KRETSCHMAR, RN @ 1256 02/20/22 BY SEKDAHL   Chloride 96 (L) 98 - 111 mmol/L   CO2 <7 (L) 22 - 32 mmol/L   Glucose, Bld 143 (H) 70 - 99 mg/dL    Comment: Glucose reference range applies only to samples taken after fasting for at least 8 hours.   BUN 73 (H) 8 - 23 mg/dL   Creatinine, Ser 10.95 (H) 0.61 - 1.24 mg/dL   Calcium 8.0 (L) 8.9 - 10.3 mg/dL   Total Protein 5.1 (L) 6.5 - 8.1 g/dL   Albumin 2.9 (L) 3.5 - 5.0 g/dL   AST 45 (H) 15 - 41 U/L   ALT 44 0 - 44 U/L   Alkaline Phosphatase 45 38 - 126 U/L   Total Bilirubin 0.9 0.3 - 1.2 mg/dL   GFR, Estimated 5 (L) >60 mL/min    Comment: (NOTE) Calculated using the CKD-EPI Creatinine  Equation (2021)    Anion gap NOT CALCULATED 5 - 15    Comment: Performed at [facility], [place] [address], [place], [place] [address]  Lactic acid, plasma     Status: Abnormal   Collection Time: 02/20/22 10:43 AM  Result Value Ref Range   Lactic Acid, Venous >9.0 (HH) 0.5 - 1.9 mmol/L    Comment: CRITICAL RESULT CALLED TO, READ BACK BY AND VERIFIED WITH H.VAN RN '@1206'$  12.19.2023 E.AHMED Performed at [facility], [place] [address] [place], [place] [address]   Type and screen [place]     Status: None   Collection Time: 02/20/22 12:07 PM  Result Value Ref Range   ABO/RH(D) O POS    Antibody Screen NEG    Sample Expiration      02/23/2022,2359 Performed at [facility], [place] [address] [place], [place] [address]   Resp panel by RT-PCR (RSV, Flu A&B, Covid) Anterior Nasal Swab     Status: None   Collection Time: 02/20/22  1:00 PM   Specimen: Anterior Nasal Swab  Result Value Ref Range   SARS Coronavirus 2 by RT PCR NEGATIVE NEGATIVE    Comment: (NOTE) SARS-CoV-2 target nucleic acids are NOT DETECTED.  The SARS-CoV-2 RNA is generally detectable in upper respiratory specimens during the acute phase of infection. The lowest concentration of  SARS-CoV-2 viral copies this assay can detect is 138 copies/mL. A negative result does not preclude SARS-Cov-2 infection and should not be used as the sole basis for treatment or other patient management decisions. A negative result may occur with  improper specimen collection/handling, submission of specimen other than nasopharyngeal swab, presence of viral mutation(s) within the areas targeted by this assay, and inadequate number of viral copies(<138 copies/mL). A negative result must be combined with clinical observations, patient history, and epidemiological information. The expected result is Negative.  Fact Sheet for Patients:  EntrepreneurPulse.com.au  Fact Sheet for Healthcare  Providers:  IncredibleEmployment.be  This test is no t yet approved or cleared by the [place] FDA and  has been authorized for detection and/or diagnosis of SARS-CoV-2 by FDA under an Emergency Use Authorization (EUA). This EUA will remain  in effect (meaning this test can be used) for the duration of the COVID-19 declaration under Section 564(b)(1) of the Act, 21 U.S.C.section 360bbb-3(b)(1), unless the authorization is terminated  or revoked sooner.       Influenza A by PCR NEGATIVE NEGATIVE   Influenza B by PCR NEGATIVE NEGATIVE    Comment: (NOTE) The Xpert Xpress SARS-CoV-2/FLU/RSV plus assay is intended as an aid in the diagnosis of influenza from Nasopharyngeal swab specimens and should not be used as a sole basis for treatment. Nasal washings and aspirates are unacceptable for Xpert Xpress SARS-CoV-2/FLU/RSV testing.  Fact Sheet for Patients: EntrepreneurPulse.com.au  Fact Sheet for Healthcare Providers: IncredibleEmployment.be  This test is not yet approved or cleared by the [place] FDA and has been authorized for detection and/or diagnosis of SARS-CoV-2 by FDA under an Emergency Use Authorization (EUA). This EUA will remain in effect (meaning this test can be used) for the duration of the COVID-19 declaration under Section 564(b)(1) of the Act, 21 U.S.C. section 360bbb-3(b)(1), unless the authorization is terminated or revoked.     Resp Syncytial Virus by PCR NEGATIVE NEGATIVE    Comment: (NOTE) Fact Sheet for Patients: EntrepreneurPulse.com.au  Fact Sheet for Healthcare Providers: IncredibleEmployment.be  This test is not yet approved or cleared by the [place] FDA and has been authorized for detection and/or diagnosis of SARS-CoV-2 by FDA under an Emergency Use Authorization (EUA). This EUA will remain in effect (meaning this test can be used) for the  duration of the COVID-19 declaration under Section 564(b)(1) of the Act, 21 U.S.C. section 360bbb-3(b)(1), unless the authorization is terminated or revoked.  Performed at [facility], [place] [address] [place], [place] [address]   I-stat chem 8, ED     Status: Abnormal   Collection Time: 02/20/22  1:07 PM  Result Value Ref Range   Sodium 134 (L) 135 - 145 mmol/L   Potassium 7.0 (HH) 3.5 - 5.1 mmol/L   Chloride 103 98 - 111 mmol/L   BUN 81 (H) 8 - 23 mg/dL   Creatinine, Ser 11.70 (H) 0.61 - 1.24 mg/dL   Glucose, Bld 142 (H) 70 - 99 mg/dL    Comment: Glucose reference range applies only to samples taken after fasting for at least 8 hours.   Calcium, Ion 0.92 (L) 1.15 - 1.40 mmol/L   TCO2 5 (L) 22 - 32 mmol/L   Hemoglobin 8.8 (L) 13.0 - 17.0 g/dL   HCT 26.0 (L) 39.0 - 52.0 %   Comment NOTIFIED PHYSICIAN     CT Angio Chest/Abd/Pel for Dissection W and/or Wo Contrast  Result Date: 02/20/2022 CLINICAL DATA:  Chest pain and back  pain. Possible acute aortic syndrome. EXAM: CT ANGIOGRAPHY CHEST, ABDOMEN AND PELVIS TECHNIQUE: Non-contrast CT of the chest was initially obtained. Multidetector CT imaging through the chest, abdomen and pelvis was performed using the standard protocol during bolus administration of intravenous contrast. Multiplanar reconstructed images and MIPs were obtained and reviewed to evaluate the vascular anatomy. RADIATION DOSE REDUCTION: This exam was performed according to the departmental dose-optimization program which includes automated exposure control, adjustment of the mA and/or kV according to patient size and/or use of iterative reconstruction technique. CONTRAST:  12m OMNIPAQUE IOHEXOL 350 MG/ML SOLN COMPARISON:  Abdominal/pelvic CT scan 06/04/2021 FINDINGS: CTA CHEST FINDINGS Examination is limited by breathing motion artifact in the chest, abdomen and pelvis. Cardiovascular: The heart is normal in size. No pericardial effusion. The aorta is normal in  caliber. No dissection. Scattered atherosclerotic calcifications. The aortic branch vessels are patent. There are age advanced three-vessel coronary artery calcifications and calcifications around the aortic valve. The pulmonary arteries are unremarkable. No findings for pulmonary embolism. Mediastinum/Nodes: No mediastinal or hilar mass or lymphadenopathy. The esophagus is grossly normal. Lungs/Pleura: No acute pulmonary process. No pulmonary edema, pulmonary infiltrates or pleural effusions. Significant breathing motion artifact noted at the lung bases in particular. Musculoskeletal: No chest wall mass, supraclavicular or axillary adenopathy. The bony thorax is intact. Advanced degenerative changes at the right sternoclavicular joint. Review of the MIP images confirms the above findings. CTA ABDOMEN AND PELVIS FINDINGS VASCULAR Aorta: Normal caliber. No dissection. Moderate scattered atherosclerotic calcifications. Celiac: Ostial atherosclerotic calcifications with mild stenosis. SMA: Advanced atherosclerotic calcification involving the proximal artery with probable greater than 50% stenosis at the origin. Renals: 2 left renal arteries and 3 right renal arteries with atherosclerotic calcifications but no occlusion or significant stenosis. IMA: Patent Inflow: Moderate atherosclerotic calcifications but no aneurysm, dissection or significant stenosis. Veins: The major venous structures are grossly patent. Review of the MIP images confirms the above findings. NON-VASCULAR Hepatobiliary: Diffuse and marked fatty infiltration of the liver. No hepatic lesions or intrahepatic biliary dilatation. The gallbladder is grossly normal. No common bile duct dilatation. Pancreas: No mass, inflammation or ductal dilatation. Spleen: Normal size.  No focal lesions. Adrenals/Urinary Tract: The adrenal glands and kidneys are unremarkable. The bladder is not well distended. No gross abnormality. Stomach/Bowel: The stomach, duodenum,  small bowel and colon are grossly normal. Descending and sigmoid colon diverticulosis but no findings for acute diverticulitis. Lymphatic: No abdominal or pelvic lymphadenopathy. Reproductive: The prostate gland and seminal vesicles are grossly normal. Other: Evidence of right inguinal hernia repair. No recurrent hernia. Musculoskeletal: No significant bony findings. 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 Review of the MIP images confirms the above findings. IMPRESSION: 1. No aortic aneurysm or dissection. 2. Age advanced three-vessel coronary artery calcifications and calcifications around the aortic valve. 3. No acute pulmonary findings. 4. Age advanced atherosclerotic calcifications involving aorta and branch vessels but no aneurysm, dissection or severe stenosis. 5. Diffuse and marked fatty infiltration of the liver. 6. No acute abdominal/pelvic findings, mass lesions or adenopathy. Aortic Atherosclerosis (ICD10-I70.0). Electronically Signed   By: PMarijo SanesM.D.   On: 02/20/2022 12:53   DG Chest 1 View  Result Date: 02/20/2022 CLINICAL DATA:  [id number] Pain [id number]: CHEST  1 VIEW COMPARISON:  09/05/2021. FINDINGS: Cardiac silhouette is unremarkable. No pneumothorax or pleural effusion. The lungs are clear. There are thoracic degenerative changes. IMPRESSION: No acute cardiopulmonary process. Electronically Signed   By: JSammie BenchM.D.   On: 02/20/2022 11:25    Assessment/Plan  AKI: in the setting of n/v/d, ACEi, and metformin.  Hypovolemic in the setting of n/v/d.  Aggressive fluid resuscitation and K management.    - hopefully we can squeak by withotu dialysis and with aggressive supportive care  - pt getting a central line with PCCM, they;ll place HD cath in case we need it- appreciate  - ? If metformin toxicity plays a role, will do bicarb gtt, trend lactate- if no better with supportive care will likely need CRRT to correct   - noted he did get contrast for CTA  2.  Hyperkalemia:  -  lokelma TID  - bicarb gtt  - s/p insulin/dextrose and calcium  - albuterol  - trend labs  3.  Shock  - likely septic?  - no dissection  - broad spectrum abx per PCCM  4.  Dispo: to ICU  Jose [name] 02/20/2022, 2:29 PM

## 2022-02-20 NOTE — Progress Notes (Signed)
[facility]     Patient has already received 2 liters of isotonic HCO3 with no UOP, HCO3<7 and hyperkalemic. Appreciate RIJ trialysis catheter by CCM and RIJ noted to be engorged as well. Will plan on initiating CRRT on a 2K bath with no UF to temporize given the hyperkalemia and anuric status. Will also have the RN perform a bladder scan; not expecting obstruction with a negative CT CAP.  Past Medical History  Past Medical History:  Diagnosis Date   ALCOHOL ABUSE 07/27/2008   Allergy    ANXIETY 07/27/2008   Arthritis    Bifascicular block    beta blocker stopped due to profound bradycardia   CAD 07/27/2008   a. s/p Endeavor DES to CFX 07/2008; residual OM1 70%, EF 45%;  b. relook cath 5/10: patent stent in CFX;  c.  LHC (8/15):  prox LAD 30%, OM1 50% at bifurcation, OM1 sub-branch 50%, mid AVCFX stent patent, EF 55-65% - no change from 2010 >>> Med Rx (after abnormal nuc)    Cataract    Diabetes mellitus without complication ([place])    GERD 09/18/2006   History of TIA (transient ischemic attack) 07/2008   Hx of adenomatous colonic polyps    Hx of cardiovascular stress test    ETT-Myoview (09/2013):  Partially fixed defect with some reversibility - cannot r/o inf ischemia vs diaph atten, EF 51%; mod risk   Hyperlipidemia    HYPERTENSION 09/18/2006   LOW BACK PAIN 09/18/2006   Myocardial infarction ([place]) 2010   Numbness and tingling    finger tips at times   Rosacea 07/27/2008   SEIZURE DISORDER 09/18/2006   none since 2008   Squamous cell carcinoma of skin 07/19/2021   in situ - right nasal sidewall   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   Urinary tract infection start cipro 01-07-2012   Past Surgical History  Past Surgical History:  Procedure Laterality Date   CORONARY STENT PLACEMENT     drug eluting   GOLD SEED IMPLANT N/A 04/11/2021   Procedure: GOLD SEED IMPLANT SPACE OAR;  Surgeon: Robley Fries, MD;  Location: [place];  Service: Urology;   Laterality: N/A;   HERNIA REPAIR     INGUINAL HERNIA REPAIR  01/14/2012   Procedure: LAPAROSCOPIC INGUINAL HERNIA;  Surgeon: Ralene Ok, MD;  Location[facility];  Service: General;  Laterality: Right;   INSERTION OF MESH  01/14/2012   Procedure: INSERTION OF MESH;  Surgeon: Ralene Ok, MD;  Location: [facility];  Service: General;  Laterality: Right;   KNEE SURGERY     LEFT HEART CATHETERIZATION WITH CORONARY ANGIOGRAM N/A 10/21/2013   Procedure: LEFT HEART CATHETERIZATION WITH CORONARY ANGIOGRAM;  Surgeon: Blane Ohara, MD;  Location: [facility] CATH LAB;  Service: Cardiovascular;  Laterality: N/A;   MEDIAL PARTIAL KNEE REPLACEMENT Left    Family History  Family History  Problem Relation Age of Onset   Lung cancer Mother    Hypertension Mother    Diabetes Mother    Hyperlipidemia Mother    Emphysema Father    Heart failure Father    Melanoma Sister    Melanoma Paternal Grandmother    Heart attack Other    Colon cancer Neg Hx    Stroke Neg Hx    Esophageal cancer Neg Hx    Liver cancer Neg Hx    Pancreatic cancer Neg Hx    Stomach cancer Neg Hx    Rectal cancer Neg Hx    Social History  reports  that he has never smoked. He quit smokeless tobacco use about 10 years ago.  His smokeless tobacco use included chew. He reports that he does not drink alcohol and does not use drugs. Allergies No Known Allergies Home medications Prior to Admission medications   Medication Sig Start Date End Date Taking? Authorizing Provider  ALPRAZolam (XANAX) 0.5 MG tablet TAKE 1 TABLET BY MOUTH THREE TIMES A DAY AS NEEDED Patient taking differently: Take 0.5 mg by mouth 3 (three) times daily as needed for anxiety. 02/01/21   [name], [name], MD  amLODipine (NORVASC) 5 MG tablet TAKE 1 TABLET (5 MG TOTAL) BY MOUTH DAILY. 10/05/21   [name], [name], MD  aspirin EC 81 MG tablet Take 81 mg by mouth at bedtime. Swallow whole.    [provider]  atorvastatin (LIPITOR) 20 MG  tablet TAKE 1 TABLET BY MOUTH EVERY DAY 12/14/21   [name], [name], MD  Azelaic Acid 15 % cream APPLY 1 APPLICATION TOPICALLY AS DIRECTED. AFTER SKIN IS THOROUGHLY WASHED AND PATTED DRY, GENTLY BUT THOROUGHLY MASSAGE A THIN FILM OF AZELAIC ACID INTO THE AFFECTED AREA TWICE DAILY, IN THE MORNING AND EVENING. APPLIED TO FACE. 10/22/18   [name], [name], MD  benazepril (LOTENSIN) 40 MG tablet TAKE 1 TABLET BY MOUTH EVERY DAY 02/12/22   [name], [name], MD  bisoprolol (ZEBETA) 10 MG tablet Take 1 tablet (10 mg total) by mouth daily. 08/07/21   [name], MD  cholecalciferol (VITAMIN D) 25 MCG (1000 UNIT) tablet Take 1,000 Units by mouth daily.    [provider]  diclofenac sodium (VOLTAREN) 1 % GEL Apply 2 g topically 2 (two) times daily as needed (joint pain (knees)).     [provider]  EPINEPHrine 0.3 mg/0.3 mL IJ SOAJ injection Inject 0.3 mg into the muscle as needed for anaphylaxis. 09/05/21   Dorie Rank, MD  glucose blood (ONETOUCH VERIO) test strip USE TO TEST BLOOD SUGAR ONCE A DAY DX E11.51 - 12/06/21   [name], [name], MD  halobetasol (ULTRAVATE) 0.05 % cream Apply 1 application. topically 2 (two) times daily as needed (dry skin).    [provider]  Lancets [facility] DELICA PLUS [id number]) [place] 1 each by Other route daily. Dx e11.9 12/05/21   [name], [name], MD  metFORMIN (GLUCOPHAGE) 1000 MG tablet TAKE 1 TABLET (1,000 MG TOTAL) BY MOUTH TWICE A DAY WITH FOOD 01/17/22   [name], [name], MD  Multiple Vitamin (MULTIVITAMIN WITH MINERALS) TABS Take 1 tablet by mouth every evening.    [provider]  nitroGLYCERIN (NITROSTAT) 0.4 MG SL tablet DISSOLVE 1 TABLET BY MOUTH UNDER THE TONGUE EVERY 5 MINUTES AS NEEDED FOR CHEST PAIN 12/05/21   [name], [name], MD  ondansetron (ZOFRAN) 8 MG tablet Take 1 tablet (8 mg total) by mouth every 8 (eight) hours as needed for nausea or vomiting.  05/23/21   Bruning, Ashlyn, PA-C  tadalafil (CIALIS) 20 MG tablet Take 20 mg by mouth as needed.    [provider]  tamsulosin (FLOMAX) 0.4 MG CAPS capsule Take 0.4 mg by mouth every evening. 05/26/21   [provider]  triamcinolone cream (KENALOG) 0.1 % Apply 1 Application topically 2 (two) times daily.    [provider]  vitamin C (ASCORBIC ACID) 500 MG tablet Take 1,000 mg by mouth daily. Reported on 08/23/2015    [provider]    Current Facility-Administered Medications:    [  START ON 02/21/2022] ceFEPIme (MAXIPIME) 1 g in sodium chloride 0.9 % 100 mL IVPB, 1 g, Intravenous, Q24H, Bertis Ruddy, RPH   Chlorhexidine Gluconate Cloth 2 % PADS 6 each, 6 each, Topical, Q0600, Julian Hy, DO, 6 each at 02/20/22 1636   docusate sodium (COLACE) capsule 100 mg, 100 mg, Oral, BID PRN, Corey Harold, NP   doxycycline (VIBRAMYCIN) 100 mg in sodium chloride 0.9 % 250 mL IVPB, 100 mg, Intravenous, Q12H, Julian Hy, DO, Stopped at 02/20/22 1701   heparin injection 1,000-6,000 Units, 1,000-6,000 Units, CRRT, PRN, Dwana Melena, MD   heparin injection 5,000 Units, 5,000 Units, Subcutaneous, Q8H, Hoffman, Paul W, NP   insulin aspart (novoLOG) injection 0-15 Units, 0-15 Units, Subcutaneous, Q4H, Corey Harold, NP, 11 Units at 02/20/22 1738   lactated ringers infusion, , Intravenous, Continuous, Corey Harold, NP, Last Rate: 30 mL/hr at 02/20/22 1837, New Bag at 02/20/22 1837   norepinephrine (LEVOPHED) '4mg'$  in 21m (0.016 mg/mL) premix infusion, 0-40 mcg/min, Intravenous, Continuous, LCarmin Muskrat MD, Last Rate: 150 mL/hr at 02/20/22 1614, 40 mcg/min at 02/20/22 1614   polyethylene glycol (MIRALAX / GLYCOLAX) packet 17 g, 17 g, Oral, Daily PRN, HCorey Harold NP   prismasol BGK 2/2.5 dialysis solution, , CRRT, Continuous, LDwana Melena MD   prismasol BGK 2/2.5 replacement solution, , CRRT, Continuous, LDwana Melena MD   prismasol BGK 2/2.5 replacement  solution, , CRRT, Continuous, LDwana Melena MD   sodium bicarbonate 150 mEq in dextrose 5 % 1,150 mL infusion, , Intravenous, Continuous, HCorey Harold NP, Last Rate: 150 mL/hr at 02/20/22 1407, Rate Change at 02/20/22 1407   sodium bicarbonate 150 mEq in sterile water 1,150 mL infusion, , Intravenous, Continuous, HCorey Harold NP, Last Rate: 150 mL/hr at 02/20/22 1902, New Bag at 02/20/22 1902   sodium chloride 0.9 % primer fluid for CRRT, , CRRT, PRN, LDwana Melena MD   sodium zirconium cyclosilicate (LOKELMA) packet 10 g, 10 g, Oral, TID, UMadelon Lips MD, 10 g at 02/20/22 1819   vasopressin (PITRESSIN) 20 Units in sodium chloride 0.9 % 100 mL infusion-*FOR SHOCK*, 0-0.03 Units/min, Intravenous, Continuous, HCorey Harold NP, Last Rate: 9 mL/hr at 02/20/22 1836, 0.03 Units/min at 02/20/22 1836 Liver Function Tests Recent Labs  Lab 02/20/22 1043  AST 45*  ALT 44  ALKPHOS 45  BILITOT 0.9  PROT 5.1*  ALBUMIN 2.9*   No results for input(s): "LIPASE", "AMYLASE" in the last 168 hours. CBC Recent Labs  Lab 02/20/22 1043 02/20/22 1307 02/20/22 1603  WBC 8.1  --  7.5  NEUTROABS  --   --  4.4  HGB 10.7* 8.8* 9.7*  HCT 36.5* 26.0* 32.0*  MCV 121.7*  --  116.4*  PLT 139*  --  1017   Basic Metabolic Panel Recent Labs  Lab 02/20/22 1043 02/20/22 1307 02/20/22 1446 02/20/22 1646  NA 140 134* 140 138  K 7.3* 7.0* 6.7* 6.6*  CL 96* 103 96* 93*  CO2 <7*  --  <7* <7*  GLUCOSE 143* 142* 232* 338*  BUN 73* 81* 69* 71*  CREATININE 10.95* 11.70* 10.24* 10.13*  CALCIUM 8.0*  --  7.6* 7.5*    JOtelia Santee MD Work  ([phone]/19/2023, 7:05 PM

## 2022-02-20 NOTE — Procedures (Signed)
Arterial Catheter Insertion Procedure Note  Jose [name]  [id number]  03-28-1950  Date:02/20/22  Time:9:05 PM    Provider Performing: Marlowe Aschoff    Procedure: Insertion of Arterial Line [phone]) without US guidance  Indication(s) Blood pressure monitoring and/or need for frequent ABGs  Consent Risks of the procedure as well as the alternatives and risks of each were explained to the patient and/or caregiver.  Consent for the procedure was obtained and is signed in the bedside chart  Anesthesia None   Time Out Verified patient identification, verified procedure, site/side was marked, verified correct patient position, special equipment/implants available, medications/allergies/relevant history reviewed, required imaging and test results available.   Sterile Technique Maximal sterile technique including full sterile barrier drape, hand hygiene, sterile gown, sterile gloves, mask, hair covering, sterile ultrasound probe cover (if used).   Procedure Description Area of catheter insertion was cleaned with chlorhexidine and draped in sterile fashion. Without real-time ultrasound guidance an arterial catheter was placed into the right radial artery.  Appropriate arterial tracings confirmed on monitor.     Complications/Tolerance None; patient tolerated the procedure well.   EBL Minimal   Specimen(s) None

## 2022-02-20 NOTE — Progress Notes (Signed)
[place] Progress Note Patient Name: Jose [name] DOB: 03-14-1950 MRN: [id number]   Date of Service  02/20/2022  HPI/Events of Note  Nursing requesting A-line for accurate BP monitoring. Patient is on Norepinephrine and Vasopressin IV infusion for hemodynamic support. Now getting ready for CRRT.  eICU Interventions  Plan: RT to place A-line.      Intervention Category Major Interventions: Other:  Lysle Dingwall 02/20/2022, 8:24 PM

## 2022-02-20 NOTE — ED Triage Notes (Addendum)
EMS stated chest pain , pallor, pelvic pain and lower back pain this pain started on Friday. He is getting worse, in the truck he continued to get worse. 18 g rt. AC ASA '324mg'$ 

## 2022-02-20 NOTE — Progress Notes (Signed)
Pt complaining of nausea and gagging. [place] notified. QTc on monitor is noted to be measuring around 570-580.

## 2022-02-20 NOTE — Progress Notes (Signed)
Pharmacy Antibiotic Note  Jose [name] is a 71 y.o. male admitted on 02/20/2022 presenting hypotensive, elevated LA, concern for sepsis.  Pharmacy has been consulted for cefepime dosing.  Plan: Cefepime 2g IV x 1, then 1g IV q 24h Monitor renal function, Cx and clinical progression to narrow     Temp (24hrs), Avg:98.1 F (36.7 C), Min:98.1 F (36.7 C), Max:98.1 F (36.7 C)  Recent Labs  Lab 02/20/22 1043 02/20/22 1307  WBC 8.1  --   CREATININE 10.95* 11.70*  LATICACIDVEN >9.0*  --     CrCl cannot be calculated (Unknown ideal weight.).    No Known Allergies  Bertis Ruddy, PharmD, [facility] Clinical Pharmacist ED Pharmacist Phone # [phone] 02/20/2022 2:13 PM

## 2022-02-20 NOTE — H&P (Addendum)
NAME:  Jose [name], MRN:  [id number], DOB:  02-06-1951, LOS: 0 ADMISSION DATE:  02/20/2022, CONSULTATION DATE:  12/19 REFERRING MD:  Dr. Vanita Panda EDP, CHIEF COMPLAINT:  Renal failure   History of Present Illness:  71 year old male with PMH as below, which is significant for DM, HTN, CAD. He was in his usual state of health until 12/15 when he developed nausea and vomiting which persisted all the way through to presentation on 12/19. Family reports he has not been able to keep a drop of fluid or a bite of food down since that time. Presented 12/19 with complaints of chest pain, abdominal pain, lightheadedness, and diaphoresis. Workup in the ED included CT of the abdomen and pelvis which did not describe any acute issues. Laboratory evaluation was significant for K 7.3, CO2 <7, BUN 73, Creatinine 10.95, lactic acid > 9. The patient also become hypotensive despite 3L IVF resuscitation and was started on norepinephrine infusion.  PCCM was asked to evaluate in the setting of lactic acidosis and hyperkalemia.   Pertinent  Medical History   has a past medical history of ALCOHOL ABUSE (07/27/2008), Allergy, ANXIETY (07/27/2008), Arthritis, Bifascicular block, CAD (07/27/2008), Cataract, Diabetes mellitus without complication ([place]), GERD (09/18/2006), History of TIA (transient ischemic attack) (07/2008), adenomatous colonic polyps, cardiovascular stress test, Hyperlipidemia, HYPERTENSION (09/18/2006), LOW BACK PAIN (09/18/2006), Myocardial infarction ([place]) (2010), Numbness and tingling, Rosacea (07/27/2008), SEIZURE DISORDER (09/18/2006), Squamous cell carcinoma of skin (07/19/2021), THROMBOCYTOPENIA (07/27/2008), and Urinary tract infection (start cipro 01-07-2012).   Significant Hospital Events: Including procedures, antibiotic start and stop dates in addition to other pertinent events   12/19 admit  Interim History / Subjective:    Objective   Blood pressure (!) 72/35, pulse 71, temperature 98.1 F  (36.7 C), resp. rate (!) 29, SpO2 99 %.       No intake or output data in the 24 hours ending 02/20/22 1359 There were no vitals filed for this visit.  Examination: General: older adult male in mild distress HENT: [place]/AT, PERRL, no JVD appreciated.  Lungs: Clear bilateral breath sounds Cardiovascular: RRR, no MRG Abdomen: Soft, diffusely tender, hypoactive.  Extremities: No acute deformity. No edema Neuro: Alert, oriented, non-focal.   Resolved Hospital Problem list     Assessment & Plan:   Acute renal failure: likely in the setting of severe dehydration. No oral intake for 4-5 days. Mucous membranes are incredibly dry. Also has been taking ACEi and metformin. S/p 3L in ED. - Admit to ICU - Nephrology consulting, appreciate their assistance.  - Aggressive IVF resuscitation - Trend BMP  Hyperkalemia: in the setting of acute renal failure. 7.3 on initial labs. - Treated with bicarb, calcium, insulin, and kayexalate in ED - Bicarb drip - Hydrate - RRT considered, appreciate nephrology.   Shock: hypovolemic vs vasoplegia r/t acidosis. Cannot rule out sepsis although source unclear.  - Volume - Norepi for MAP goal 65 - Trend Lactic - Empiric cefepime/doxy  Metabolic acidosis: Renal failure and Lactic acidosis. Metformin tox? - Bicarb infusion - Trend Lactic, BMP  CAD HTN - Holding home amlodipine, asa, benazepril, bisoprolol  DM - Holding home metformin - CBG monitoring and SSI   Best Practice (right click and "Reselect all SmartList Selections" daily)   Diet/type: NPO DVT prophylaxis: prophylactic heparin  GI prophylaxis: N/A Lines: N/A Foley:  N/A Code Status:  full code Last date of multidisciplinary goals of care discussion [12/19]  Labs   CBC: Recent Labs  Lab 02/20/22 1043 02/20/22 1307  WBC 8.1  --   HGB 10.7* 8.8*  HCT 36.5* 26.0*  MCV 121.7*  --   PLT 139*  --     Basic Metabolic Panel: Recent Labs  Lab 02/20/22 1043 02/20/22 1307  NA  140 134*  K 7.3* 7.0*  CL 96* 103  CO2 <7*  --   GLUCOSE 143* 142*  BUN 73* 81*  CREATININE 10.95* 11.70*  CALCIUM 8.0*  --    GFR: CrCl cannot be calculated (Unknown ideal weight.). Recent Labs  Lab 02/20/22 1043  WBC 8.1  LATICACIDVEN >9.0*    Liver Function Tests: Recent Labs  Lab 02/20/22 1043  AST 45*  ALT 44  ALKPHOS 45  BILITOT 0.9  PROT 5.1*  ALBUMIN 2.9*   No results for input(s): "LIPASE", "AMYLASE" in the last 168 hours. No results for input(s): "AMMONIA" in the last 168 hours.  ABG    Component Value Date/Time   TCO2 5 (L) 02/20/2022 1307     Coagulation Profile: No results for input(s): "INR", "PROTIME" in the last 168 hours.  Cardiac Enzymes: No results for input(s): "CKTOTAL", "CKMB", "CKMBINDEX", "TROPONINI" in the last 168 hours.  HbA1C: Hemoglobin A1C  Date/Time Value Ref Range Status  05/31/2021 09:16 AM 5.5 4.0 - 5.6 % Final   Hgb A1c MFr Bld  Date/Time Value Ref Range Status  07/05/2020 09:29 AM 5.7 4.6 - 6.5 % Final    Comment:    Glycemic Control Guidelines for People with Diabetes:Non Diabetic:  <6%Goal of Therapy: <7%Additional Action Suggested:  >8%   06/23/2019 07:42 AM 5.5 4.6 - 6.5 % Final    Comment:    Glycemic Control Guidelines for People with Diabetes:Non Diabetic:  <6%Goal of Therapy: <7%Additional Action Suggested:  >8%     CBG: No results for input(s): "GLUCAP" in the last 168 hours.  Review of Systems:    Bolds are positive  Constitutional: weight loss, gain, night sweats, Fevers, chills, fatigue .  HEENT: headaches, Sore throat, sneezing, nasal congestion, post nasal drip, Difficulty swallowing, Tooth/dental problems, visual complaints visual changes, ear ache CV:  chest pain better now, radiates:,Orthopnea, PND, swelling in lower extremities, dizziness, palpitations, syncope.  GI  heartburn, indigestion, abdominal pain, nausea, vomiting, diarrhea, change in bowel habits, loss of appetite, bloody stools.   Resp: cough, productive: , hemoptysis, dyspnea, chest pain, pleuritic.  Skin: rash or itching or icterus GU: dysuria, change in color of urine, urgency or frequency. flank pain, hematuria  MS: joint pain or swelling. decreased range of motion  Psych: change in mood or affect. depression or anxiety.  Neuro: difficulty with speech, weakness, numbness, ataxia    Past Medical History:  He,  has a past medical history of ALCOHOL ABUSE (07/27/2008), Allergy, ANXIETY (07/27/2008), Arthritis, Bifascicular block, CAD (07/27/2008), Cataract, Diabetes mellitus without complication ([place]), GERD (09/18/2006), History of TIA (transient ischemic attack) (07/2008), adenomatous colonic polyps, cardiovascular stress test, Hyperlipidemia, HYPERTENSION (09/18/2006), LOW BACK PAIN (09/18/2006), Myocardial infarction ([place]) (2010), Numbness and tingling, Rosacea (07/27/2008), SEIZURE DISORDER (09/18/2006), Squamous cell carcinoma of skin (07/19/2021), THROMBOCYTOPENIA (07/27/2008), and Urinary tract infection (start cipro 01-07-2012).   Surgical History:   Past Surgical History:  Procedure Laterality Date   CORONARY STENT PLACEMENT     drug eluting   GOLD SEED IMPLANT N/A 04/11/2021   Procedure: GOLD SEED IMPLANT SPACE OAR;  Surgeon: Robley Fries, MD;  [facility];  Service: Urology;  Laterality: N/A;   HERNIA REPAIR     INGUINAL HERNIA REPAIR  01/14/2012   Procedure: LAPAROSCOPIC INGUINAL HERNIA;  Surgeon: Ralene Ok, MD;  [facility];  Service: General;  Laterality: Right;   INSERTION OF MESH  01/14/2012   Procedure: INSERTION OF MESH;  Surgeon: Ralene Ok, MD;  [facility];  Service: General;  Laterality: Right;   KNEE SURGERY     LEFT HEART CATHETERIZATION WITH CORONARY ANGIOGRAM N/A 10/21/2013   Procedure: LEFT HEART CATHETERIZATION WITH CORONARY ANGIOGRAM;  Surgeon: Blane Ohara, MD;  [facility] C[facility] LAB;  Service: Cardiovascular;  Laterality: N/A;    MEDIAL PARTIAL KNEE REPLACEMENT Left      Social History:   reports that he has never smoked. He quit smokeless tobacco use about 10 years ago.  His smokeless tobacco use included chew. He reports that he does not drink alcohol and does not use drugs.   Family History:  His family history includes Diabetes in his mother; Emphysema in his father; Heart attack in an other family member; Heart failure in his father; Hyperlipidemia in his mother; Hypertension in his mother; Lung cancer in his mother; Melanoma in his paternal grandmother and sister. There is no history of Colon cancer, Stroke, Esophageal cancer, Liver cancer, Pancreatic cancer, Stomach cancer, or Rectal cancer.   Allergies No Known Allergies   Home Medications  Prior to Admission medications   Medication Sig Start Date End Date Taking? Authorizing Provider  ALPRAZolam (XANAX) 0.5 MG tablet TAKE 1 TABLET BY MOUTH THREE TIMES A DAY AS NEEDED Patient taking differently: Take 0.5 mg by mouth 3 (three) times daily as needed for anxiety. 02/01/21   [name], [name], MD  amLODipine (NORVASC) 5 MG tablet TAKE 1 TABLET (5 MG TOTAL) BY MOUTH DAILY. 10/05/21   [name], [name], MD  aspirin EC 81 MG tablet Take 81 mg by mouth at bedtime. Swallow whole.    [provider]  atorvastatin (LIPITOR) 20 MG tablet TAKE 1 TABLET BY MOUTH EVERY DAY 12/14/21   [name], [name], MD  Azelaic Acid 15 % cream APPLY 1 APPLICATION TOPICALLY AS DIRECTED. AFTER SKIN IS THOROUGHLY WASHED AND PATTED DRY, GENTLY BUT THOROUGHLY MASSAGE A THIN FILM OF AZELAIC ACID INTO THE AFFECTED AREA TWICE DAILY, IN THE MORNING AND EVENING. APPLIED TO FACE. 10/22/18   [name], [name], MD  benazepril (LOTENSIN) 40 MG tablet TAKE 1 TABLET BY MOUTH EVERY DAY 02/12/22   [name], [name], MD  bisoprolol (ZEBETA) 10 MG tablet Take 1 tablet (10 mg total) by mouth daily. 08/07/21   [name], MD  cholecalciferol (VITAMIN D) 25  MCG (1000 UNIT) tablet Take 1,000 Units by mouth daily.    [provider]  diclofenac sodium (VOLTAREN) 1 % GEL Apply 2 g topically 2 (two) times daily as needed (joint pain (knees)).     [provider]  EPINEPHrine 0.3 mg/0.3 mL IJ SOAJ injection Inject 0.3 mg into the muscle as needed for anaphylaxis. 09/05/21   Dorie Rank, MD  glucose blood (ONETOUCH VERIO) test strip USE TO TEST BLOOD SUGAR ONCE A DAY DX E11.51 - 12/06/21   [name], [name], MD  halobetasol (ULTRAVATE) 0.05 % cream Apply 1 application. topically 2 (two) times daily as needed (dry skin).    [provider]  Lancets [facility] DELICA PLUS [id number]) [place] 1 each by Other route daily. Dx e11.9 12/05/21   [name], [name], MD  metFORMIN (GLUCOPHAGE) 1000 MG tablet TAKE 1 TABLET (1,000 MG TOTAL) BY MOUTH TWICE A  DAY WITH FOOD 01/17/22   [name], [name], MD  Multiple Vitamin (MULTIVITAMIN WITH MINERALS) TABS Take 1 tablet by mouth every evening.    [provider]  nitroGLYCERIN (NITROSTAT) 0.4 MG SL tablet DISSOLVE 1 TABLET BY MOUTH UNDER THE TONGUE EVERY 5 MINUTES AS NEEDED FOR CHEST PAIN 12/05/21   [name], [name], MD  ondansetron (ZOFRAN) 8 MG tablet Take 1 tablet (8 mg total) by mouth every 8 (eight) hours as needed for nausea or vomiting. 05/23/21   Bruning, Ashlyn, PA-C  tadalafil (CIALIS) 20 MG tablet Take 20 mg by mouth as needed.    [provider]  tamsulosin (FLOMAX) 0.4 MG CAPS capsule Take 0.4 mg by mouth every evening. 05/26/21   [provider]  triamcinolone cream (KENALOG) 0.1 % Apply 1 Application topically 2 (two) times daily.    [provider]  vitamin C (ASCORBIC ACID) 500 MG tablet Take 1,000 mg by mouth daily. Reported on 08/23/2015    [provider]     Critical care time: 57 minutes     Georgann Housekeeper, AGACNP-BC [place] for personal pager PCCM on call pager  [phone] until 7pm. Please call Elink 7p-7a. [phone]  02/20/2022 2:32 PM

## 2022-02-20 NOTE — Progress Notes (Signed)
Patient seen and examined at bedside. Family updated. On 14 mcg norepi. Starting bicarb now.  Full consult note to follow.   Julian Hy, DO 02/20/22 2:07 PM [facility]

## 2022-02-20 NOTE — Procedures (Addendum)
Central Venous Catheter Insertion Procedure Note  Jose [name]  [id number]  [birth year]  Date:02/20/22  Time:5:19 PM   Provider Performing:Audris Speaker Viona Gilmore [place]   Procedure: Insertion of Non-tunneled Central Venous Catheter(36556)with US guidance ([id number])    Indication(s) Medication administration and Hemodialysis  Consent Risks of the procedure as well as the alternatives and risks of each were explained to the patient and/or caregiver.  Consent for the procedure was obtained and is signed in the bedside chart  Anesthesia Topical only with 1% lidocaine   Timeout Verified patient identification, verified procedure, site/side was marked, verified correct patient position, special equipment/implants available, medications/allergies/relevant history reviewed, required imaging and test results available.  Sterile Technique Maximal sterile technique including full sterile barrier drape, hand hygiene, sterile gown, sterile gloves, mask, hair covering, sterile ultrasound probe cover (if used).  Procedure Description Area of catheter insertion was cleaned with chlorhexidine and draped in sterile fashion.   With real-time ultrasound guidance a HD catheter was placed into the right internal jugular vein.  Nonpulsatile blood flow and easy flushing noted in all ports.  The catheter was sutured in place and sterile dressing applied.  Complications/Tolerance None; patient tolerated the procedure well. Chest X-ray is ordered to verify placement for internal jugular or subclavian cannulation.  Chest x-ray is not ordered for femoral cannulation.  EBL Minimal  Specimen(s) None     Georgann Housekeeper, AGACNP-BC [facility]  See Amion for personal pager PCCM on call pager [phone] until 7pm. Please call Elink 7p-7a. [phone]  02/20/2022 5:20 PM

## 2022-02-20 NOTE — ED Notes (Signed)
Dr. [place] (Pulmonology) at bedside

## 2022-02-20 NOTE — ED Provider Notes (Signed)
[facility] EMERGENCY DEPARTMENT Provider Note   CSN: [id number] Arrival date & time: 02/20/22  1035     History  Chief Complaint  Patient presents with   Chest Pain   Back Pain   Nausea   Emesis   Dizziness    Jose [name] is a 71 y.o. male.  HPI Patient presents via EMS with him and discussed his arrival at bedside (in triage), due to chest pain, abdominal pain, lightheadedness, diaphoresis. Patient has multiple medical issues including hypertension, CAD, over the past 4 days has developed pain in the aforementioned areas as well as weakness, near syncope.  EMS reports patient was normotensive and hypotensive en route, with ECG demonstrating bundle branch block, ST wave changes  Home Medications Prior to Admission medications   Medication Sig Start Date End Date Taking? Authorizing Provider  ALPRAZolam (XANAX) 0.5 MG tablet TAKE 1 TABLET BY MOUTH THREE TIMES A DAY AS NEEDED Patient taking differently: Take 0.5 mg by mouth 3 (three) times daily as needed for anxiety. 02/01/21   [name], [name], MD  amLODipine (NORVASC) 5 MG tablet TAKE 1 TABLET (5 MG TOTAL) BY MOUTH DAILY. 10/05/21   [name], [name], MD  aspirin EC 81 MG tablet Take 81 mg by mouth at bedtime. Swallow whole.    [provider]  atorvastatin (LIPITOR) 20 MG tablet TAKE 1 TABLET BY MOUTH EVERY DAY 12/14/21   [name], [name], MD  Azelaic Acid 15 % cream APPLY 1 APPLICATION TOPICALLY AS DIRECTED. AFTER SKIN IS THOROUGHLY WASHED AND PATTED DRY, GENTLY BUT THOROUGHLY MASSAGE A THIN FILM OF AZELAIC ACID INTO THE AFFECTED AREA TWICE DAILY, IN THE MORNING AND EVENING. APPLIED TO FACE. 10/22/18   [name], [name], MD  benazepril (LOTENSIN) 40 MG tablet TAKE 1 TABLET BY MOUTH EVERY DAY 02/12/22   [name], [name], MD  bisoprolol (ZEBETA) 10 MG tablet Take 1 tablet (10 mg total) by mouth daily. 08/07/21   [name], MD  cholecalciferol (VITAMIN  D) 25 MCG (1000 UNIT) tablet Take 1,000 Units by mouth daily.    [provider]  diclofenac sodium (VOLTAREN) 1 % GEL Apply 2 g topically 2 (two) times daily as needed (joint pain (knees)).     [provider]  EPINEPHrine 0.3 mg/0.3 mL IJ SOAJ injection Inject 0.3 mg into the muscle as needed for anaphylaxis. 09/05/21   Dorie Rank, MD  glucose blood (ONETOUCH VERIO) test strip USE TO TEST BLOOD SUGAR ONCE A DAY DX E11.51 - 12/06/21   [name], [name], MD  halobetasol (ULTRAVATE) 0.05 % cream Apply 1 application. topically 2 (two) times daily as needed (dry skin).    [provider]  Lancets [facility] [employer] DELICA PLUS GEXBMW41L) [place] 1 each by Other route daily. Dx e11.9 12/05/21   [name], [name], MD  metFORMIN (GLUCOPHAGE) 1000 MG tablet TAKE 1 TABLET (1,000 MG TOTAL) BY MOUTH TWICE A DAY WITH FOOD 01/17/22   [name], [name], MD  Multiple Vitamin (MULTIVITAMIN WITH MINERALS) TABS Take 1 tablet by mouth every evening.    [provider]  nitroGLYCERIN (NITROSTAT) 0.4 MG SL tablet DISSOLVE 1 TABLET BY MOUTH UNDER THE TONGUE EVERY 5 MINUTES AS NEEDED FOR CHEST PAIN 12/05/21   [name], [name], MD  ondansetron (ZOFRAN) 8 MG tablet Take 1 tablet (8 mg total) by mouth every 8 (eight) hours as needed for nausea or vomiting. 05/23/21   Bruning, Ashlyn, PA-C  tadalafil (CIALIS) 20 MG tablet Take 20 mg by mouth as needed.    [provider]  tamsulosin (FLOMAX) 0.4 MG CAPS capsule Take 0.4 mg by mouth every evening. 05/26/21   [provider]  triamcinolone cream (KENALOG) 0.1 % Apply 1 Application topically 2 (two) times daily.    [provider]  vitamin C (ASCORBIC ACID) 500 MG tablet Take 1,000 mg by mouth daily. Reported on 08/23/2015    [provider]      Allergies    Patient has no known allergies.    Review of Systems   Review of Systems  All other systems reviewed and are  negative.   Physical Exam Updated Vital Signs There were no vitals taken for this visit. Physical Exam Vitals and nursing note reviewed.  Constitutional:      Appearance: He is ill-appearing and diaphoretic.  HENT:     Head: Normocephalic and atraumatic.  Eyes:     Conjunctiva/sclera: Conjunctivae normal.  Cardiovascular:     Rate and Rhythm: Normal rate and regular rhythm.  Pulmonary:     Effort: Pulmonary effort is normal. Tachypnea present.  Abdominal:     General: There is no distension.  Skin:    General: Skin is warm.     Coloration: Skin is pale.  Neurological:     Mental Status: He is alert and oriented to person, place, and time.     ED Results / Procedures / Treatments   Labs (all labs ordered are listed, but only abnormal results are displayed) Labs Reviewed  CBC - Abnormal; Notable for the following components:      Result Value   RBC 3.00 (*)    Hemoglobin 10.7 (*)    HCT 36.5 (*)    MCV 121.7 (*)    MCH 35.7 (*)    MCHC 29.3 (*)    Platelets 139 (*)    All other components within normal limits  COMPREHENSIVE METABOLIC PANEL - Abnormal; Notable for the following components:   Potassium 7.3 (*)    Chloride 96 (*)    CO2 <7 (*)    Glucose, Bld 143 (*)    BUN 73 (*)    Creatinine, Ser 10.95 (*)    Calcium 8.0 (*)    Total Protein 5.1 (*)    Albumin 2.9 (*)    AST 45 (*)    GFR, Estimated 5 (*)    All other components within normal limits  LACTIC ACID, PLASMA - Abnormal; Notable for the following components:   Lactic Acid, Venous >9.0 (*)    All other components within normal limits  I-STAT CHEM 8, ED - Abnormal; Notable for the following components:   Sodium 134 (*)    Potassium 7.0 (*)    BUN 81 (*)    Creatinine, Ser 11.70 (*)    Glucose, Bld 142 (*)    Calcium, Ion 0.92 (*)    TCO2 5 (*)    Hemoglobin 8.8 (*)    HCT 26.0 (*)    All other components within normal limits  RESP PANEL BY RT-PCR (RSV, FLU A&B, COVID)  RVPGX2  LACTIC ACID,  PLASMA  CBG MONITORING, ED  TYPE AND SCREEN  ABO/RH  TROPONIN I (HIGH SENSITIVITY)  TROPONIN I (HIGH SENSITIVITY)    EKG EKG Interpretation  Date/Time:  Tuesday February 20 2022 10:55:16 EST Ventricular Rate:  66 PR Interval:  174 QRS Duration: 180 QT Interval:  536 QTC Calculation: 561 R Axis:   -  57 Text Interpretation: Sinus rhythm with Premature ventricular complexes or Fusion complexes Left axis deviation Right bundle branch block ST-t wave abnormality Abnormal ECG Confirmed by Carmin Muskrat [phone]) on 02/20/2022 11:01:25 AM  Radiology CT Angio Chest/Abd/Pel for Dissection W and/or Wo Contrast  Result Date: 02/20/2022 CLINICAL DATA:  Chest pain and back pain. Possible acute aortic syndrome. EXAM: CT ANGIOGRAPHY CHEST, ABDOMEN AND PELVIS TECHNIQUE: Non-contrast CT of the chest was initially obtained. Multidetector CT imaging through the chest, abdomen and pelvis was performed using the standard protocol during bolus administration of intravenous contrast. Multiplanar reconstructed images and MIPs were obtained and reviewed to evaluate the vascular anatomy. RADIATION DOSE REDUCTION: This exam was performed according to the departmental dose-optimization program which includes automated exposure control, adjustment of the mA and/or kV according to patient size and/or use of iterative reconstruction technique. CONTRAST:  125m OMNIPAQUE IOHEXOL 350 MG/ML SOLN COMPARISON:  Abdominal/pelvic CT scan 06/04/2021 FINDINGS: CTA CHEST FINDINGS Examination is limited by breathing motion artifact in the chest, abdomen and pelvis. Cardiovascular: The heart is normal in size. No pericardial effusion. The aorta is normal in caliber. No dissection. Scattered atherosclerotic calcifications. The aortic branch vessels are patent. There are age advanced three-vessel coronary artery calcifications and calcifications around the aortic valve. The pulmonary arteries are unremarkable. No findings for pulmonary  embolism. Mediastinum/Nodes: No mediastinal or hilar mass or lymphadenopathy. The esophagus is grossly normal. Lungs/Pleura: No acute pulmonary process. No pulmonary edema, pulmonary infiltrates or pleural effusions. Significant breathing motion artifact noted at the lung bases in particular. Musculoskeletal: No chest wall mass, supraclavicular or axillary adenopathy. The bony thorax is intact. Advanced degenerative changes at the right sternoclavicular joint. Review of the MIP images confirms the above findings. CTA ABDOMEN AND PELVIS FINDINGS VASCULAR Aorta: Normal caliber. No dissection. Moderate scattered atherosclerotic calcifications. Celiac: Ostial atherosclerotic calcifications with mild stenosis. SMA: Advanced atherosclerotic calcification involving the proximal artery with probable greater than 50% stenosis at the origin. Renals: 2 left renal arteries and 3 right renal arteries with atherosclerotic calcifications but no occlusion or significant stenosis. IMA: Patent Inflow: Moderate atherosclerotic calcifications but no aneurysm, dissection or significant stenosis. Veins: The major venous structures are grossly patent. Review of the MIP images confirms the above findings. NON-VASCULAR Hepatobiliary: Diffuse and marked fatty infiltration of the liver. No hepatic lesions or intrahepatic biliary dilatation. The gallbladder is grossly normal. No common bile duct dilatation. Pancreas: No mass, inflammation or ductal dilatation. Spleen: Normal size.  No focal lesions. Adrenals/Urinary Tract: The adrenal glands and kidneys are unremarkable. The bladder is not well distended. No gross abnormality. Stomach/Bowel: The stomach, duodenum, small bowel and colon are grossly normal. Descending and sigmoid colon diverticulosis but no findings for acute diverticulitis. Lymphatic: No abdominal or pelvic lymphadenopathy. Reproductive: The prostate gland and seminal vesicles are grossly normal. Other: Evidence of right  inguinal hernia repair. No recurrent hernia. Musculoskeletal: No significant bony findings. 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 Review of the MIP images confirms the above findings. IMPRESSION: 1. No aortic aneurysm or dissection. 2. Age advanced three-vessel coronary artery calcifications and calcifications around the aortic valve. 3. No acute pulmonary findings. 4. Age advanced atherosclerotic calcifications involving aorta and branch vessels but no aneurysm, dissection or severe stenosis. 5. Diffuse and marked fatty infiltration of the liver. 6. No acute abdominal/pelvic findings, mass lesions or adenopathy. Aortic Atherosclerosis (ICD10-I70.0). Electronically Signed   By: PMarijo SanesM.D.   On: 02/20/2022 12:53   DG Chest 1 View  Result Date: 02/20/2022 CLINICAL  DATA:  [id number] Pain [id number] EXAM: CHEST  1 VIEW COMPARISON:  09/05/2021. FINDINGS: Cardiac silhouette is unremarkable. No pneumothorax or pleural effusion. The lungs are clear. There are thoracic degenerative changes. IMPRESSION: No acute cardiopulmonary process. Electronically Signed   By: Sammie Bench M.D.   On: 02/20/2022 11:25    Procedures Procedures    Medications Ordered in ED Medications  aspirin chewable tablet 324 mg (has no administration in time range)  sodium chloride 0.9 % bolus 1,000 mL (has no administration in time range)    And  0.9 %  sodium chloride infusion (has no administration in time range)    ED Course/ Medical Decision Making/ A&P This patient with a Hx of CAD, hypertension, anxiety presents to the ED for concern of chest pain, abdominal pain, pallor, hypotension, this involves an extensive number of treatment options, and is a complaint that carries with it a high risk of complications and morbidity.    The differential diagnosis includes acute chest syndrome including ACS, aortic dissection, pneumonia, infection   Social Determinants of Health:  Advanced age psychiatric  disease/anxiety  Additional history obtained:  Additional history and/or information obtained from EMS at bedside, notable for details included in HPI   After the initial evaluation, orders, including: CT abdomen pelvis chest, labs, fluids, monitoring were initiated.   Patient placed on Cardiac and Pulse-Oximetry Monitors. The patient was maintained on a cardiac monitor.  The cardiac monitored showed an rhythm of 75 sinus normal Patient was initially not hypoxic, but had an episode of dyspnea on transfer to a regular exam room, requiring initiation of her percent oxygen, with saturation of 100% at that point, abnormal     On repeat evaluation of the patient had a declining condition as he transition to a exam room.  He had some decreased interactivity, but awakened, stated he can lie flat for skin, and as his skin has not yet happened he was made a code medical to facilitate this.   12:10 PM Initial lactic greater than 9  1:09 PM Discussed the patient's radiographic image with Dr. Candise Che.  No aneurysm, no dissection.  Patient now accompanied by wife and son at bedside.  We reviewed findings thus far, including evidence for acute renal failure, substantial electrolyte abnormalities, and persistent blood pressure.  Now, after 3 L fluid resuscitation patient MAP is 67, Levophed starting, critical care aware, nephrology (Dr. Hollie Salk) also aware  Lab Tests:  I personally interpreted labs.  The pertinent results include: Substantial abnormalities, most consistent with acute metabolic derangement with potassium greater than 7, creatinine greater than 10.  Imaging Studies ordered:  I independently visualized and interpreted imaging which showed as above no evidence for pneumonia, no evidence for aortic dissection or aneurysm.  No acute injury or abdominal abnormalities identified on CT I agree with the radiologist interpretation  Consultations Obtained:  I requested consultation with  the as above nephrology, critical care,  and discussed lab and imaging findings as well as pertinent plan - they recommend: Patient was admitted to critical care  Dispostion / Final MDM:  After consideration of the diagnostic results and the patient's response to treatment, this adult male with multiple medical problems, but generally well until 4 days ago presents with chest pain, abdominal pain, back pain, hypotension, lightheadedness, and initial differential was broad, though including aortic dissection/aneurysm, ACS, bowel ischemia, bacteremia, sepsis.  Patient was afebrile, awake, alert, but with substantial vital sign abnormalities expeditious evaluation proceeded through stat CT angiography which I reviewed  in the radiology suite.  No evidence for dissection, aneurysm.  However, patient was found early on to have evidence for acute kidney failure.  Patient is on metformin and this may be contributory, but as his family arrived, and more history was elucidated, prescription the patient has had no p.o. intake for several days, has had persistent nausea, discomfort, and there is greater suspicion for metabolic syndrome secondary to infectious or other GI pathology.  Patient had numeric improvement after 3 L of fluid resuscitation in the ED, but required initiation of Levophed.  With substantial ECG abnormalities, hyperkalemia, renal failure, the patient was started on bicarbonate drip, received other medications for electrolyte normalization, this was discussed with our nephrology colleague, Dr. Hollie Salk.  Her service will also assist in caring for the patient.  Patient admitted to the ICU.  Final Clinical Impression(s) / ED Diagnoses Final diagnoses:  Acute renal failure, unspecified acute renal failure type ([place])  Hyperkalemia  CRITICAL CARE Performed by: Carmin Muskrat Total critical care time: 60 minutes Critical care time was exclusive of separately billable procedures and treating other  patients. Critical care was necessary to treat or prevent imminent or life-threatening deterioration. Critical care was time spent personally by me on the following activities: development of treatment plan with patient and/or surrogate as well as nursing, discussions with consultants, evaluation of patient's response to treatment, examination of patient, obtaining history from patient or surrogate, ordering and performing treatments and interventions, ordering and review of laboratory studies, ordering and review of radiographic studies, pulse oximetry and re-evaluation of patient's condition.    Carmin Muskrat, MD 02/20/22 1340

## 2022-02-20 NOTE — Progress Notes (Addendum)
eLink Physician-Brief Progress Note Patient Name: Jose [name] DOB: 10/03/1950 MRN: [id number]   Date of Service  02/20/2022  HPI/Events of Note  Multiple issues: 1. Hypotension - BP = 95/37 with MAP = 55. Now on a Norepinephrine IV infusion at 40 mcg/min and a Vasopressin IV infusion at shock dose. 2. ABG on 4 L/min = 7.064/15.7/164/4.5. Patient already on a NaHCO3 IV infusion. 3. Hypocalcemia - Ionized Ca++ = 0.94. 4. Patient c/o sore throat.   eICU Interventions  Plan: Increase ceiling on Norepinephrine IV infusion to 80 mcg/min. NaHCO3 200 meq IV now. Repeat ABG at 12 midnight. Start CRRT as soon as possible.  Will replace Ca++. Phosphorus level now.  Chloraseptic Spray 1 spray to throat PRN throat pain.      Intervention Category Major Interventions: Acid-Base disturbance - evaluation and management;Respiratory failure - evaluation and management  Lysle Dingwall 02/20/2022, 9:37 PM

## 2022-02-20 NOTE — Progress Notes (Signed)
Repeat labs reviewed-discussed with nephrology.  Planning to start CRRT tonight.  CBC differential did not demonstrate schistocytes.  Will repeat CBC with differential tomorrow morning.  Julian Hy, DO 02/20/22 7:04 PM [facility]

## 2022-02-20 NOTE — ED Notes (Addendum)
Critical care (Dr. Carlis Abbott) at bedside

## 2022-02-20 NOTE — Progress Notes (Signed)
[name] [id number] Admission Data: 02/20/2022 6:57 PM Attending Provider: Julian Hy, DO  [id number] Everardo Beals, MD Consults/ Treatment Team: Treatment Team:  Madelon Lips, MD  Jose [name] is a 71 y.o. male patient admitted from ED awake, alert  & orientated  X 3,  Full Code, VSS - Blood pressure (!) 94/50, pulse 76, temperature (!) 92.7 F (33.7 C), temperature source Oral, resp. rate (!) 23, SpO2 100 %., O2   4L, Tele # 64M -13 placed and pt is currently running:normal sinus rhythm.    Skin, clean-dry- intact without evidence of bruising, or skin tears.   No evidence of skin break down noted on exam.   Will cont to monitor and assist as needed.  Hosie Spangle, [place] 02/20/2022 6:57 PM

## 2022-02-21 ENCOUNTER — Inpatient Hospital Stay (HOSPITAL_COMMUNITY): Payer: Medicare Other

## 2022-02-21 DIAGNOSIS — R571 Hypovolemic shock: Secondary | ICD-10-CM

## 2022-02-21 LAB — POCT I-STAT 7, (LYTES, BLD GAS, ICA,H+H)
Acid-base deficit: 9 mmol/L — ABNORMAL HIGH (ref 0.0–2.0)
Bicarbonate: 14.1 mmol/L — ABNORMAL LOW (ref 20.0–28.0)
Calcium, Ion: 0.93 mmol/L — ABNORMAL LOW (ref 1.15–1.40)
HCT: 25 % — ABNORMAL LOW (ref 39.0–52.0)
Hemoglobin: 8.5 g/dL — ABNORMAL LOW (ref 13.0–17.0)
O2 Saturation: 96 %
Patient temperature: 97.6
Potassium: 3.9 mmol/L (ref 3.5–5.1)
Sodium: 138 mmol/L (ref 135–145)
TCO2: 15 mmol/L — ABNORMAL LOW (ref 22–32)
pCO2 arterial: 22.3 mmHg — ABNORMAL LOW (ref 32–48)
pH, Arterial: 7.408 (ref 7.35–7.45)
pO2, Arterial: 78 mmHg — ABNORMAL LOW (ref 83–108)

## 2022-02-21 LAB — CBC WITH DIFFERENTIAL/PLATELET
Abs Immature Granulocytes: 0.04 10*3/uL (ref 0.00–0.07)
Basophils Absolute: 0 10*3/uL (ref 0.0–0.1)
Basophils Relative: 0 %
Eosinophils Absolute: 0 10*3/uL (ref 0.0–0.5)
Eosinophils Relative: 0 %
HCT: 26.9 % — ABNORMAL LOW (ref 39.0–52.0)
Hemoglobin: 9.5 g/dL — ABNORMAL LOW (ref 13.0–17.0)
Immature Granulocytes: 1 %
Lymphocytes Relative: 17 %
Lymphs Abs: 1.2 10*3/uL (ref 0.7–4.0)
MCH: 36.1 pg — ABNORMAL HIGH (ref 26.0–34.0)
MCHC: 35.3 g/dL (ref 30.0–36.0)
MCV: 102.3 fL — ABNORMAL HIGH (ref 80.0–100.0)
Monocytes Absolute: 0.7 10*3/uL (ref 0.1–1.0)
Monocytes Relative: 10 %
Neutro Abs: 5 10*3/uL (ref 1.7–7.7)
Neutrophils Relative %: 72 %
Platelets: 95 10*3/uL — ABNORMAL LOW (ref 150–400)
RBC: 2.63 MIL/uL — ABNORMAL LOW (ref 4.22–5.81)
RDW: 13.5 % (ref 11.5–15.5)
WBC: 6.9 10*3/uL (ref 4.0–10.5)
nRBC: 0 % (ref 0.0–0.2)

## 2022-02-21 LAB — RENAL FUNCTION PANEL
Albumin: 2.5 g/dL — ABNORMAL LOW (ref 3.5–5.0)
Albumin: 2.4 g/dL — ABNORMAL LOW (ref 3.5–5.0)
Anion gap: 33 — ABNORMAL HIGH (ref 5–15)
Anion gap: 17 — ABNORMAL HIGH (ref 5–15)
BUN: 51 mg/dL — ABNORMAL HIGH (ref 8–23)
BUN: 36 mg/dL — ABNORMAL HIGH (ref 8–23)
CO2: 13 mmol/L — ABNORMAL LOW (ref 22–32)
CO2: 24 mmol/L (ref 22–32)
Calcium: 7.4 mg/dL — ABNORMAL LOW (ref 8.9–10.3)
Calcium: 6.9 mg/dL — ABNORMAL LOW (ref 8.9–10.3)
Chloride: 96 mmol/L — ABNORMAL LOW (ref 98–111)
Chloride: 96 mmol/L — ABNORMAL LOW (ref 98–111)
Creatinine, Ser: 6.74 mg/dL — ABNORMAL HIGH (ref 0.61–1.24)
Creatinine, Ser: 4.28 mg/dL — ABNORMAL HIGH (ref 0.61–1.24)
GFR, Estimated: 8 mL/min — ABNORMAL LOW (ref >60)
GFR, Estimated: 14 mL/min — ABNORMAL LOW (ref >60)
Glucose, Bld: 73 mg/dL (ref 70–99)
Glucose, Bld: 152 mg/dL — ABNORMAL HIGH (ref 70–99)
Phosphorus: 6.1 mg/dL — ABNORMAL HIGH (ref 2.5–4.6)
Phosphorus: 3.9 mg/dL (ref 2.5–4.6)
Potassium: 4 mmol/L (ref 3.5–5.1)
Potassium: 3.9 mmol/L (ref 3.5–5.1)
Sodium: 142 mmol/L (ref 135–145)
Sodium: 137 mmol/L (ref 135–145)

## 2022-02-21 LAB — GLUCOSE, CAPILLARY
Glucose-Capillary: 78 mg/dL (ref 70–99)
Glucose-Capillary: 76 mg/dL (ref 70–99)
Glucose-Capillary: 86 mg/dL (ref 70–99)
Glucose-Capillary: 108 mg/dL — ABNORMAL HIGH (ref 70–99)
Glucose-Capillary: 159 mg/dL — ABNORMAL HIGH (ref 70–99)
Glucose-Capillary: 130 mg/dL — ABNORMAL HIGH (ref 70–99)
Glucose-Capillary: 132 mg/dL — ABNORMAL HIGH (ref 70–99)

## 2022-02-21 LAB — ECHOCARDIOGRAM COMPLETE
AR max vel: 3.18 cm2
AV Area VTI: 3.29 cm2
AV Area mean vel: 3.11 cm2
AV Mean grad: 7 mmHg
AV Peak grad: 11.7 mmHg
Ao pk vel: 1.71 m/s
Area-P 1/2: 4.1 cm2
Calc EF: 70.2 %
Height: 66 in
S' Lateral: 2.6 cm
Single Plane A2C EF: 69.2 %
Single Plane A4C EF: 70.7 %
Weight: 2973.56 oz

## 2022-02-21 LAB — BASIC METABOLIC PANEL
Anion gap: 38 — ABNORMAL HIGH (ref 5–15)
Anion gap: 22 — ABNORMAL HIGH (ref 5–15)
BUN: 59 mg/dL — ABNORMAL HIGH (ref 8–23)
BUN: 43 mg/dL — ABNORMAL HIGH (ref 8–23)
CO2: 10 mmol/L — ABNORMAL LOW (ref 22–32)
CO2: 19 mmol/L — ABNORMAL LOW (ref 22–32)
Calcium: 7.4 mg/dL — ABNORMAL LOW (ref 8.9–10.3)
Calcium: 7 mg/dL — ABNORMAL LOW (ref 8.9–10.3)
Chloride: 94 mmol/L — ABNORMAL LOW (ref 98–111)
Chloride: 100 mmol/L (ref 98–111)
Creatinine, Ser: 7.84 mg/dL — ABNORMAL HIGH (ref 0.61–1.24)
Creatinine, Ser: 5.27 mg/dL — ABNORMAL HIGH (ref 0.61–1.24)
GFR, Estimated: 7 mL/min — ABNORMAL LOW (ref >60)
GFR, Estimated: 11 mL/min — ABNORMAL LOW (ref >60)
Glucose, Bld: 118 mg/dL — ABNORMAL HIGH (ref 70–99)
Glucose, Bld: 101 mg/dL — ABNORMAL HIGH (ref 70–99)
Potassium: 4.2 mmol/L (ref 3.5–5.1)
Potassium: 4.1 mmol/L (ref 3.5–5.1)
Sodium: 142 mmol/L (ref 135–145)
Sodium: 141 mmol/L (ref 135–145)

## 2022-02-21 LAB — MAGNESIUM: Magnesium: 1.7 mg/dL (ref 1.7–2.4)

## 2022-02-21 LAB — PHOSPHORUS
Phosphorus: 7.5 mg/dL — ABNORMAL HIGH (ref 2.5–4.6)
Phosphorus: 6.1 mg/dL — ABNORMAL HIGH (ref 2.5–4.6)

## 2022-02-21 MED ORDER — FENTANYL CITRATE PF 50 MCG/ML IJ SOSY
12.5000 ug | PREFILLED_SYRINGE | Freq: Once | INTRAMUSCULAR | Status: AC
  Administered 2022-02-21: 12.5 ug via INTRAVENOUS
  Filled 2022-02-21: qty 1

## 2022-02-21 MED ORDER — SODIUM BICARBONATE 8.4 % IV SOLN
100.0000 meq | Freq: Once | INTRAVENOUS | Status: AC
  Administered 2022-02-21: 100 meq via INTRAVENOUS
  Filled 2022-02-21: qty 100

## 2022-02-21 MED ORDER — PRISMASOL BGK 4/2.5 32-4-2.5 MEQ/L REPLACEMENT SOLN
Status: DC
  Filled 2022-02-21 (×3): qty 5000

## 2022-02-21 MED ORDER — PROCHLORPERAZINE EDISYLATE 10 MG/2ML IJ SOLN
10.0000 mg | Freq: Four times a day (QID) | INTRAMUSCULAR | Status: DC | PRN
  Administered 2022-02-21: 10 mg via INTRAVENOUS
  Filled 2022-02-21 (×2): qty 2

## 2022-02-21 MED ORDER — STERILE WATER FOR INJECTION IV SOLN
INTRAVENOUS | Status: DC
  Filled 2022-02-21 (×7): qty 150

## 2022-02-21 MED ORDER — HEPARIN SODIUM (PORCINE) 1000 UNIT/ML DIALYSIS: 1000.0000 [IU] | INTRAMUSCULAR | Status: DC | PRN

## 2022-02-21 MED ORDER — PRISMASOL BGK 4/2.5 32-4-2.5 MEQ/L EC SOLN
Status: DC
  Filled 2022-02-21 (×15): qty 5000

## 2022-02-21 NOTE — Progress Notes (Signed)
Jose [name] Progress Note   Assessment/ Plan:   AKI: in the setting of n/v/d, ACEi, and metformin.  Hypovolemic in the setting of n/v/d.  Aggressive fluid resuscitation and K management.  pre-renal insult +/- metformin tox             - didn't respond to aggressive supportive care  - on CRRT- change bags to 4K pre and dialysate, convert bicarb gtt to the post- fluids  - making urine now  - continue for another 24 hrs or so, anticipate he will recover   2.  Hyperkalemia:             - resolved with CRRT   3.  Shock             - likely septic?             - no dissection             - broad spectrum abx per PCCM  - getting TTE  4.  Metabolic acidosis  - mostly lactate (? Metformin)  - on CRRT and bicarb gtt as post- filter   5.  Dispo: to ICU  Subjective:    CRRT initiated yesterday d/t ongoing electrolyte concerns and lactate.  Doing well this AM- urinating more, sitting up in bed, color better in face.     Objective:   BP (!) 103/52   Pulse 83   Temp 98.1 F (36.7 C) (Oral)   Resp 17   Ht '5\' 6"'$  (1.676 m)   Wt 84.3 kg   SpO2 97%   BMI 30.00 kg/m   Intake/Output Summary (Last 24 hours) at 02/21/2022 1059 Last data filed at 02/21/2022 1000 Gross per 24 hour  Intake 6468.26 ml  Output 150 ml  Net 6318.26 ml   Weight change:   Physical Exam: GEN sitting up in bed, alert and talkative HEENT EOMI PERRL NECK no JVD, flatneck veins PULM clear CV tRRR ABD nontender EXT no LE edema NEURO AAO x 3 nonfocal SKIN better color in face now  Imaging: ECHOCARDIOGRAM COMPLETE  Result Date: 02/21/2022    ECHOCARDIOGRAM REPORT   Patient Name:   Jose [name] Date of Exam: 02/21/2022 Medical Rec #:  [id number]    Height:       66.0 in Accession #:    [id number]   Weight:       185.8 lb Date of Birth:  1950-06-18    BSA:          1.938 m Patient Age:    71 years     BP:           108/50 mmHg Patient Gender: M            HR:           81 bpm. Exam Location:   Inpatient Procedure: 2D Echo, Cardiac Doppler and Color Doppler Indications:    Shock  History:        Patient has no prior history of Echocardiogram examinations. CAD                 and Previous Myocardial Infarction, TIA; Risk Factors:Diabetes,                 ETOH, Hypertension and Dyslipidemia.  Sonographer:    Eartha Inch Referring Phys: [id number] LAURA P CLARK  Sonographer Comments: Technically difficult study due to poor echo windows. Image acquisition challenging due to patient body habitus and Image acquisition challenging  due to respiratory motion. IMPRESSIONS  1. Left ventricular ejection fraction, by estimation, is 70 to 75%. Left ventricular ejection fraction by 2D MOD biplane is 70.2 %. The left ventricle has hyperdynamic function. The left ventricle has no regional wall motion abnormalities. There is mild  left ventricular hypertrophy. Left ventricular diastolic parameters are consistent with Grade I diastolic dysfunction (impaired relaxation).  2. Right ventricular systolic function is mildly reduced. The right ventricular size is normal.  3. The mitral valve is abnormal. Trivial mitral valve regurgitation.  4. The aortic valve is tricuspid. Aortic valve regurgitation is not visualized. Aortic valve sclerosis is present, with no evidence of aortic valve stenosis. Aortic valve mean gradient measures 7.0 mmHg.  5. The inferior vena cava is normal in size with greater than 50% respiratory variability, suggesting right atrial pressure of 3 mmHg.  6. Increased flow velocities may be secondary to anemia, thyrotoxicosis, hyperdynamic or high flow state. Comparison(s): No prior Echocardiogram. FINDINGS  Left Ventricle: Left ventricular ejection fraction, by estimation, is 70 to 75%. Left ventricular ejection fraction by 2D MOD biplane is 70.2 %. The left ventricle has hyperdynamic function. The left ventricle has no regional wall motion abnormalities. The left ventricular internal cavity size was normal  in size. There is mild left ventricular hypertrophy. Left ventricular diastolic parameters are consistent with Grade I diastolic dysfunction (impaired relaxation). Indeterminate filling pressures. Right Ventricle: The right ventricular size is normal. No increase in right ventricular wall thickness. Right ventricular systolic function is mildly reduced. Left Atrium: Left atrial size was normal in size. Right Atrium: Right atrial size was normal in size. Pericardium: There is no evidence of pericardial effusion. Mitral Valve: The mitral valve is abnormal. Mild mitral annular calcification. Trivial mitral valve regurgitation. Tricuspid Valve: The tricuspid valve is grossly normal. Tricuspid valve regurgitation is not demonstrated. Aortic Valve: The aortic valve is tricuspid. Aortic valve regurgitation is not visualized. Aortic valve sclerosis is present, with no evidence of aortic valve stenosis. Aortic valve mean gradient measures 7.0 mmHg. Aortic valve peak gradient measures 11.7 mmHg. Aortic valve area, by VTI measures 3.29 cm. Pulmonic Valve: The pulmonic valve was grossly normal. Pulmonic valve regurgitation is not visualized. Aorta: The aortic root and ascending aorta are structurally normal, with no evidence of dilitation. Venous: The inferior vena cava is normal in size with greater than 50% respiratory variability, suggesting right atrial pressure of 3 mmHg. IAS/Shunts: No atrial level shunt detected by color flow Doppler.  LEFT VENTRICLE PLAX 2D                        Biplane EF (MOD) LVIDd:         4.60 cm         LV Biplane EF:   Left LVIDs:         2.60 cm                          ventricular LV PW:         1.10 cm                          ejection LV IVS:        0.90 cm                          fraction by LVOT diam:     2.20 cm  2D MOD LV SV:         112                              biplane is LV SV Index:   58                               70.2 %. LVOT Area:     3.80 cm                                 Diastology                                LV e' medial:    7.07 cm/s LV Volumes (MOD)               LV E/e' medial:  13.2 LV vol d, MOD    116.0 ml      LV e' lateral:   9.68 cm/s A2C:                           LV E/e' lateral: 9.6 LV vol d, MOD    130.0 ml A4C: LV vol s, MOD    35.7 ml A2C: LV vol s, MOD    38.1 ml A4C: LV SV MOD A2C:   80.3 ml LV SV MOD A4C:   130.0 ml LV SV MOD BP:    88.8 ml RIGHT VENTRICLE            IVC RV S prime:     9.03 cm/s  IVC diam: 2.30 cm TAPSE (M-mode): 2.4 cm LEFT ATRIUM             Index        RIGHT ATRIUM           Index LA diam:        4.50 cm 2.32 cm/m   RA Area:     15.80 cm LA Vol (A2C):   48.0 ml 24.76 ml/m  RA Volume:   35.00 ml  18.06 ml/m LA Vol (A4C):   52.5 ml 27.08 ml/m LA Biplane Vol: 51.2 ml 26.41 ml/m  AORTIC VALVE AV Area (Vmax):    3.18 cm AV Area (Vmean):   3.11 cm AV Area (VTI):     3.29 cm AV Vmax:           171.00 cm/s AV Vmean:          126.000 cm/s AV VTI:            0.340 m AV Peak Grad:      11.7 mmHg AV Mean Grad:      7.0 mmHg LVOT Vmax:         143.00 cm/s LVOT Vmean:        103.000 cm/s LVOT VTI:          0.294 m LVOT/AV VTI ratio: 0.86  AORTA Ao Root diam: 3.20 cm Ao Asc diam:  3.30 cm MITRAL VALVE MV Area (PHT): 4.10 cm    SHUNTS MV Decel Time: 185 msec    Systemic VTI:  0.29 m MV E velocity: 93.30 cm/s  Systemic Diam: 2.20 cm MV A velocity: 56.10 cm/s MV E/A ratio:  1.66 Lyman Bishop MD Electronically signed by Lyman Bishop MD Signature Date/Time: 02/21/2022/10:55:57 AM    Final    DG Abd Portable 1V  Result Date: 02/21/2022 CLINICAL DATA:  Abdominal pain EXAM: PORTABLE ABDOMEN - 1 VIEW COMPARISON:  CTA abdomen/pelvis dated 02/20/2022 FINDINGS: Contrast in the gastric cardia. Nonobstructive bowel gas pattern. Visualized osseous structures are within normal limits. IMPRESSION: Negative. Electronically Signed   By: Julian Hy M.D.   On: 02/21/2022 02:29   DG Chest 1 View  Result Date:  02/20/2022 CLINICAL DATA:  Line placement. EXAM: CHEST  1 VIEW COMPARISON:  Same day. FINDINGS: Interval placement of right internal jugular catheter with distal tip in expected position of the SVC. No pneumothorax is noted. IMPRESSION: Interval placement of right internal jugular catheter with distal tip in expected position of the SVC. Electronically Signed   By: Marijo Conception M.D.   On: 02/20/2022 17:37   CT Angio Chest/Abd/Pel for Dissection W and/or Wo Contrast  Result Date: 02/20/2022 CLINICAL DATA:  Chest pain and back pain. Possible acute aortic syndrome. EXAM: CT ANGIOGRAPHY CHEST, ABDOMEN AND PELVIS TECHNIQUE: Non-contrast CT of the chest was initially obtained. Multidetector CT imaging through the chest, abdomen and pelvis was performed using the standard protocol during bolus administration of intravenous contrast. Multiplanar reconstructed images and MIPs were obtained and reviewed to evaluate the vascular anatomy. RADIATION DOSE REDUCTION: This exam was performed according to the departmental dose-optimization program which includes automated exposure control, adjustment of the mA and/or kV according to patient size and/or use of iterative reconstruction technique. CONTRAST:  163m OMNIPAQUE IOHEXOL 350 MG/ML SOLN COMPARISON:  Abdominal/pelvic CT scan 06/04/2021 FINDINGS: CTA CHEST FINDINGS Examination is limited by breathing motion artifact in the chest, abdomen and pelvis. Cardiovascular: The heart is normal in size. No pericardial effusion. The aorta is normal in caliber. No dissection. Scattered atherosclerotic calcifications. The aortic branch vessels are patent. There are age advanced three-vessel coronary artery calcifications and calcifications around the aortic valve. The pulmonary arteries are unremarkable. No findings for pulmonary embolism. Mediastinum/Nodes: No mediastinal or hilar mass or lymphadenopathy. The esophagus is grossly normal. Lungs/Pleura: No acute pulmonary process.  No pulmonary edema, pulmonary infiltrates or pleural effusions. Significant breathing motion artifact noted at the lung bases in particular. Musculoskeletal: No chest wall mass, supraclavicular or axillary adenopathy. The bony thorax is intact. Advanced degenerative changes at the right sternoclavicular joint. Review of the MIP images confirms the above findings. CTA ABDOMEN AND PELVIS FINDINGS VASCULAR Aorta: Normal caliber. No dissection. Moderate scattered atherosclerotic calcifications. Celiac: Ostial atherosclerotic calcifications with mild stenosis. SMA: Advanced atherosclerotic calcification involving the proximal artery with probable greater than 50% stenosis at the origin. Renals: 2 left renal arteries and 3 right renal arteries with atherosclerotic calcifications but no occlusion or significant stenosis. IMA: Patent Inflow: Moderate atherosclerotic calcifications but no aneurysm, dissection or significant stenosis. Veins: The major venous structures are grossly patent. Review of the MIP images confirms the above findings. NON-VASCULAR Hepatobiliary: Diffuse and marked fatty infiltration of the liver. No hepatic lesions or intrahepatic biliary dilatation. The gallbladder is grossly normal. No common bile duct dilatation. Pancreas: No mass, inflammation or ductal dilatation. Spleen: Normal size.  No focal lesions. Adrenals/Urinary Tract: The adrenal glands and kidneys are unremarkable. The bladder is not well distended. No gross abnormality. Stomach/Bowel: The stomach, duodenum, small bowel and colon are grossly normal. Descending and sigmoid colon diverticulosis but no findings for acute diverticulitis. Lymphatic: No abdominal or pelvic lymphadenopathy. Reproductive: The prostate gland and  seminal vesicles are grossly normal. Other: Evidence of right inguinal hernia repair. No recurrent hernia. Musculoskeletal: No significant bony findings. 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 Review of the MIP images  confirms the above findings. IMPRESSION: 1. No aortic aneurysm or dissection. 2. Age advanced three-vessel coronary artery calcifications and calcifications around the aortic valve. 3. No acute pulmonary findings. 4. Age advanced atherosclerotic calcifications involving aorta and branch vessels but no aneurysm, dissection or severe stenosis. 5. Diffuse and marked fatty infiltration of the liver. 6. No acute abdominal/pelvic findings, mass lesions or adenopathy. Aortic Atherosclerosis (ICD10-I70.0). Electronically Signed   By: Marijo Sanes M.D.   On: 02/20/2022 12:53   DG Chest 1 View  Result Date: 02/20/2022 CLINICAL DATA:  [id number] Pain [id number] EXAM: CHEST  1 VIEW COMPARISON:  09/05/2021. FINDINGS: Cardiac silhouette is unremarkable. No pneumothorax or pleural effusion. The lungs are clear. There are thoracic degenerative changes. IMPRESSION: No acute cardiopulmonary process. Electronically Signed   By: Sammie Bench M.D.   On: 02/20/2022 11:25    Labs: BMET Recent Labs  Lab 02/20/22 1043 02/20/22 1307 02/20/22 1446 02/20/22 1646 02/20/22 2112 02/20/22 2125 02/20/22 2330 02/21/22 0101 02/21/22 0326 02/21/22 0535  NA 140 134* 140 138 131* 138 136 142 142 138  K 7.3* 7.0* 6.7* 6.6* 5.8* 6.0* 4.6 4.2 4.0 3.9  CL 96* 103 96* 93*  --  92*  --  94* 96*  --   CO2 <7*  --  <7* <7*  --  <7*  --  10* 13*  --   GLUCOSE 143* 142* 232* 338*  --  307*  --  118* 73  --   BUN 73* 81* 69* 71*  --  69*  --  59* 51*  --   CREATININE 10.95* 11.70* 10.24* 10.13*  --  9.84*  --  7.84* 6.74*  --   CALCIUM 8.0*  --  7.6* 7.5*  --  7.4*  --  7.4* 7.4*  --   PHOS  --   --   --   --   --   --   --  7.5* 6.1*  6.1*  --    CBC Recent Labs  Lab 02/20/22 1043 02/20/22 1307 02/20/22 1603 02/20/22 2112 02/20/22 2330 02/21/22 0326 02/21/22 0535  WBC 8.1  --  7.5  --   --  6.9  --   NEUTROABS  --   --  4.4  --   --  5.0  --   HGB 10.7*   < > 9.7* 10.2* 10.2* 9.5* 8.5*  HCT 36.5*   < > 32.0* 30.0* 30.0*  26.9* 25.0*  MCV 121.7*  --  116.4*  --   --  102.3*  --   PLT 139*  --  141*  --   --  95*  --    < > = values in this interval not displayed.    Medications:     Chlorhexidine Gluconate Cloth  6 each Topical Q0600   heparin  5,000 Units Subcutaneous Q8H   insulin aspart  0-15 Units Subcutaneous Q4H    Madelon Lips, MD 02/21/2022, 10:59 AM

## 2022-02-21 NOTE — Progress Notes (Signed)
  Echocardiogram 2D Echocardiogram has been performed.  Jose [name] 02/21/2022, 10:26 AM

## 2022-02-21 NOTE — Progress Notes (Signed)
eLink Physician-Brief Progress Note Patient Name: Jose [name] DOB: 1950-05-02 MRN: [id number]   Date of Service  02/21/2022  HPI/Events of Note  Multiple issues: 1. ABG on 4 L/min [place] = 7.274/16.5/124/7.7. 2. Nausea - QTc = 0.58 seconds.   eICU Interventions  Plan: Compazine 10 mg IV Q 6 hours PRN N/V. NaHCO3 100 meq IV now.  Repeat ABG at 5 AM.     Intervention Category Major Interventions: Acid-Base disturbance - evaluation and management;Other:  [name] 02/21/2022, 12:06 AM

## 2022-02-21 NOTE — Progress Notes (Addendum)
NAME:  Jose [name], MRN:  [id number], DOB:  14-Jul-1950, LOS: 1 ADMISSION DATE:  02/20/2022, CONSULTATION DATE:  12/19 REFERRING MD:  Dr. Vanita Panda EDP, CHIEF COMPLAINT:  Renal failure   History of Present Illness:  71 year old male with PMH as below, which is significant for DM, HTN, CAD. He was in his usual state of health until 12/15 when he developed nausea and vomiting which persisted all the way through to presentation on 12/19. Family reports he has not been able to keep a drop of fluid or a bite of food down since that time. Presented 12/19 with complaints of chest pain, abdominal pain, lightheadedness, and diaphoresis. Workup in the ED included CT of the abdomen and pelvis which did not describe any acute issues. Laboratory evaluation was significant for K 7.3, CO2 <7, BUN 73, Creatinine 10.95, lactic acid > 9. The patient also become hypotensive despite 3L IVF resuscitation and was started on norepinephrine infusion.  PCCM was asked to evaluate in the setting of lactic acidosis and hyperkalemia.   Pertinent  Medical History   has a past medical history of ALCOHOL ABUSE (07/27/2008), Allergy, ANXIETY (07/27/2008), Arthritis, Bifascicular block, CAD (07/27/2008), Cataract, Diabetes mellitus without complication ([place]), GERD (09/18/2006), History of TIA (transient ischemic attack) (07/2008), adenomatous colonic polyps, cardiovascular stress test, Hyperlipidemia, HYPERTENSION (09/18/2006), LOW BACK PAIN (09/18/2006), Myocardial infarction ([place]) (2010), Numbness and tingling, Rosacea (07/27/2008), SEIZURE DISORDER (09/18/2006), Squamous cell carcinoma of skin (07/19/2021), THROMBOCYTOPENIA (07/27/2008), and Urinary tract infection (start cipro 01-07-2012).  Significant Hospital Events: Including procedures, antibiotic start and stop dates in addition to other pertinent events   12/19: nephrology initiated CRRT   Interim History / Subjective:  Patient is feeling better. Denies any chest pain,  abdominal pain or n/v. Had urge to urinate and was able to this morning.   Objective   Blood pressure (!) 102/49, pulse 85, temperature 98.1 F (36.7 C), temperature source Oral, resp. rate 19, height '5\' 6"'$  (1.676 m), weight 84.3 kg, SpO2 98 %.        Intake/Output Summary (Last 24 hours) at 02/21/2022 0825 Last data filed at 02/21/2022 0800 Gross per 24 hour  Intake 6088.82 ml  Output 100 ml  Net 5988.82 ml   Filed Weights   02/20/22 1900 02/21/22 0500  Weight: 84.3 kg 84.3 kg    Examination: General: in no acute distress Lungs: clear to auscultation bilaterally, on 3L [place] Cardiovascular: RRR Abdomen: soft, non-tender, non-distended Extremities: no edema Neuro: alert, oriented  Resolved Hospital Problem list   Hyperkalemia   Assessment & Plan:  Shock Metabolic acidosis Suspect shock related to severity of acidosis. Lactic acid on initial was >9. Patient started on CRRT. Started on empiric antibiotics and pending blood cultures yesterday. Infectious workup so far unremarkable. Afebrile with no leukocytosis. CXR unremarkable.    -on vasopressin and levophed, MAP goal 65 -will stop cefepime and doxycycline -f/u blood cultures  AKI  Likely in setting of recent n/v, ACEi and metformin. Hypovolemic on initial presentation. CT CAP did not show any renal changes to suggest obstruction. Had some urine output this morning. BUN and creatinine improving. Nephrology started CRRT yesterday.  -appreciate nephrology assistance -on CRRT -strict I/O -f/u renal function panel   Hyperkalemia Potassium 3.9 today. Stopped lokelma and bicarb. On CRRT.  -nephrology stopped bicarb and lokelma  -on CRRT  Anemia Thrombocytopenia Hgb down to 8.5 and platelets 95 today. CRRT was started yesterday which could contribute to the decrease.  -trend CBC -if Hgb <  7, consider transfusion   Diabetes -hold home metformin -on SSI  QTc prolongation EKG from yesterday showed QTc of 561. Avoid QT  prolonging medications.   CAD HTN -home amlodipine, ASA, benazepril and bisoprolol   Best Practice (right click and "Reselect all SmartList Selections" daily)   Diet/type: Regular consistency (see orders) DVT prophylaxis: prophylactic heparin  GI prophylaxis: N/A Lines: Dialysis Catheter and Arterial Line Foley:  N/A Code Status:  full code Last date of multidisciplinary goals of care discussion [12/20: son at bedside]  Labs   CBC: Recent Labs  Lab 02/20/22 1043 02/20/22 1307 02/20/22 1603 02/20/22 2112 02/20/22 2330 02/21/22 0326 02/21/22 0535  WBC 8.1  --  7.5  --   --  6.9  --   NEUTROABS  --   --  4.4  --   --  5.0  --   HGB 10.7*   < > 9.7* 10.2* 10.2* 9.5* 8.5*  HCT 36.5*   < > 32.0* 30.0* 30.0* 26.9* 25.0*  MCV 121.7*  --  116.4*  --   --  102.3*  --   PLT 139*  --  141*  --   --  95*  --    < > = values in this interval not displayed.    Basic Metabolic Panel: Recent Labs  Lab 02/20/22 1446 02/20/22 1646 02/20/22 2112 02/20/22 2125 02/20/22 2330 02/21/22 0101 02/21/22 0326 02/21/22 0535  NA 140 138   < > 138 136 142 142 138  K 6.7* 6.6*   < > 6.0* 4.6 4.2 4.0 3.9  CL 96* 93*  --  92*  --  94* 96*  --   CO2 <7* <7*  --  <7*  --  10* 13*  --   GLUCOSE 232* 338*  --  307*  --  118* 73  --   BUN 69* 71*  --  69*  --  59* 51*  --   CREATININE 10.24* 10.13*  --  9.84*  --  7.84* 6.74*  --   CALCIUM 7.6* 7.5*  --  7.4*  --  7.4* 7.4*  --   MG  --   --   --   --   --   --  1.7  --   PHOS  --   --   --   --   --  7.5* 6.1*  6.1*  --    < > = values in this interval not displayed.   GFR: Estimated Creatinine Clearance: 10.5 mL/min (A) (by C-G formula based on SCr of 6.74 mg/dL (H)). Recent Labs  Lab 02/20/22 1043 02/20/22 1300 02/20/22 1603 02/21/22 0326  WBC 8.1  --  7.5 6.9  LATICACIDVEN >9.0* >9.0*  --   --     Liver Function Tests: Recent Labs  Lab 02/20/22 1043 02/21/22 0326  AST 45*  --   ALT 44  --   ALKPHOS 45  --   BILITOT 0.9   --   PROT 5.1*  --   ALBUMIN 2.9* 2.5*   No results for input(s): "LIPASE", "AMYLASE" in the last 168 hours. No results for input(s): "AMMONIA" in the last 168 hours.  ABG    Component Value Date/Time   PHART 7.408 02/21/2022 0535   PCO2ART 22.3 (L) 02/21/2022 0535   PO2ART 78 (L) 02/21/2022 0535   HCO3 14.1 (L) 02/21/2022 0535   TCO2 15 (L) 02/21/2022 0535   ACIDBASEDEF 9.0 (H) 02/21/2022 0535   O2SAT 96 02/21/2022 0535  Coagulation Profile: No results for input(s): "INR", "PROTIME" in the last 168 hours.  Cardiac Enzymes: No results for input(s): "CKTOTAL", "CKMB", "CKMBINDEX", "TROPONINI" in the last 168 hours.  HbA1C: Hemoglobin A1C  Date/Time Value Ref Range Status  05/31/2021 09:16 AM 5.5 4.0 - 5.6 % Final   Hgb A1c MFr Bld  Date/Time Value Ref Range Status  07/05/2020 09:29 AM 5.7 4.6 - 6.5 % Final    Comment:    Glycemic Control Guidelines for People with Diabetes:Non Diabetic:  <6%Goal of Therapy: <7%Additional Action Suggested:  >8%   06/23/2019 07:42 AM 5.5 4.6 - 6.5 % Final    Comment:    Glycemic Control Guidelines for People with Diabetes:Non Diabetic:  <6%Goal of Therapy: <7%Additional Action Suggested:  >8%     CBG: Recent Labs  Lab 02/20/22 1940 02/20/22 2326 02/21/22 0330 02/21/22 0645 02/21/22 0811  GLUCAP 326* 172* 78 76 86    Past Medical History:  He,  has a past medical history of ALCOHOL ABUSE (07/27/2008), Allergy, ANXIETY (07/27/2008), Arthritis, Bifascicular block, CAD (07/27/2008), Cataract, Diabetes mellitus without complication ([place]), GERD (09/18/2006), History of TIA (transient ischemic attack) (07/2008), adenomatous colonic polyps, cardiovascular stress test, Hyperlipidemia, HYPERTENSION (09/18/2006), LOW BACK PAIN (09/18/2006), Myocardial infarction ([place]) (2010), Numbness and tingling, Rosacea (07/27/2008), SEIZURE DISORDER (09/18/2006), Squamous cell carcinoma of skin (07/19/2021), THROMBOCYTOPENIA (07/27/2008), and Urinary  tract infection (start cipro 01-07-2012).   Surgical History:   Past Surgical History:  Procedure Laterality Date   CORONARY STENT PLACEMENT     drug eluting   GOLD SEED IMPLANT N/A 04/11/2021   Procedure: GOLD SEED IMPLANT SPACE OAR;  Surgeon: Robley Fries, MD;  Location: [facility];  Service: Urology;  Laterality: N/A;   HERNIA REPAIR     INGUINAL HERNIA REPAIR  01/14/2012   Procedure: LAPAROSCOPIC INGUINAL HERNIA;  Surgeon: Ralene Ok, MD;  Location: [facility];  Service: General;  Laterality: Right;   INSERTION OF MESH  01/14/2012   Procedure: INSERTION OF MESH;  Surgeon: Ralene Ok, MD;  Location: [facility];  Service: General;  Laterality: Right;   KNEE SURGERY     LEFT HEART CATHETERIZATION WITH CORONARY ANGIOGRAM N/A 10/21/2013   Procedure: LEFT HEART CATHETERIZATION WITH CORONARY ANGIOGRAM;  Surgeon: Blane Ohara, MD;  Location: [facility] C[facility];  Service: Cardiovascular;  Laterality: N/A;   MEDIAL PARTIAL KNEE REPLACEMENT Left      Social History:   reports that he has never smoked. He quit smokeless tobacco use about 10 years ago.  His smokeless tobacco use included chew. He reports that he does not drink alcohol and does not use drugs.   Family History:  His family history includes Diabetes in his mother; Emphysema in his father; Heart attack in an other family member; Heart failure in his father; Hyperlipidemia in his mother; Hypertension in his mother; Lung cancer in his mother; Melanoma in his paternal grandmother and sister. There is no history of Colon cancer, Stroke, Esophageal cancer, Liver cancer, Pancreatic cancer, Stomach cancer, or Rectal cancer.   Allergies No Known Allergies   Home Medications  Prior to Admission medications   Medication Sig Start Date End Date Taking? Authorizing Provider  ALPRAZolam (XANAX) 0.5 MG tablet TAKE 1 TABLET BY MOUTH THREE TIMES A DAY AS NEEDED Patient taking differently: Take 0.5 mg by mouth 3 (three) times  daily as needed for anxiety. 02/01/21   [name], [name], MD  amLODipine (NORVASC) 5 MG tablet TAKE 1 TABLET (5 MG TOTAL) BY MOUTH  DAILY. 10/05/21   [name], [name], MD  aspirin EC 81 MG tablet Take 81 mg by mouth at bedtime. Swallow whole.    [provider]  atorvastatin (LIPITOR) 20 MG tablet TAKE 1 TABLET BY MOUTH EVERY DAY 12/14/21   [name], [name], MD  Azelaic Acid 15 % cream APPLY 1 APPLICATION TOPICALLY AS DIRECTED. AFTER SKIN IS THOROUGHLY WASHED AND PATTED DRY, GENTLY BUT THOROUGHLY MASSAGE A THIN FILM OF AZELAIC ACID INTO THE AFFECTED AREA TWICE DAILY, IN THE MORNING AND EVENING. APPLIED TO FACE. 10/22/18   [name], [name], MD  benazepril (LOTENSIN) 40 MG tablet TAKE 1 TABLET BY MOUTH EVERY DAY 02/12/22   [name], [name], MD  bisoprolol (ZEBETA) 10 MG tablet Take 1 tablet (10 mg total) by mouth daily. 08/07/21   [name], MD  cholecalciferol (VITAMIN D) 25 MCG (1000 UNIT) tablet Take 1,000 Units by mouth daily.    [provider]  diclofenac sodium (VOLTAREN) 1 % GEL Apply 2 g topically 2 (two) times daily as needed (joint pain (knees)).     [provider]  EPINEPHrine 0.3 mg/0.3 mL IJ SOAJ injection Inject 0.3 mg into the muscle as needed for anaphylaxis. 09/05/21   Dorie Rank, MD  glucose blood (ONETOUCH VERIO) test strip USE TO TEST BLOOD SUGAR ONCE A DAY DX E11.51 - 12/06/21   [name], [name], MD  halobetasol (ULTRAVATE) 0.05 % cream Apply 1 application. topically 2 (two) times daily as needed (dry skin).    [provider]  Lancets [facility] DELICA PLUS [id number]) [place] 1 each by Other route daily. Dx e11.9 12/05/21   [name], [name], MD  metFORMIN (GLUCOPHAGE) 1000 MG tablet TAKE 1 TABLET (1,000 MG TOTAL) BY MOUTH TWICE A DAY WITH FOOD 01/17/22   [name], [name], MD  Multiple Vitamin (MULTIVITAMIN WITH MINERALS) TABS Take 1 tablet by mouth every evening.     [provider]  nitroGLYCERIN (NITROSTAT) 0.4 MG SL tablet DISSOLVE 1 TABLET BY MOUTH UNDER THE TONGUE EVERY 5 MINUTES AS NEEDED FOR CHEST PAIN 12/05/21   [name], [name], MD  ondansetron (ZOFRAN) 8 MG tablet Take 1 tablet (8 mg total) by mouth every 8 (eight) hours as needed for nausea or vomiting. 05/23/21   Bruning, Ashlyn, PA-C  tadalafil (CIALIS) 20 MG tablet Take 20 mg by mouth as needed.    [provider]  tamsulosin (FLOMAX) 0.4 MG CAPS capsule Take 0.4 mg by mouth every evening. 05/26/21   [provider]  triamcinolone cream (KENALOG) 0.1 % Apply 1 Application topically 2 (two) times daily.    [provider]  vitamin C (ASCORBIC ACID) 500 MG tablet Take 1,000 mg by mouth daily. Reported on 08/23/2015    [provider]

## 2022-02-21 NOTE — Progress Notes (Addendum)
eLink Physician-Brief Progress Note Patient Name: Jose [name] DOB: 1950-09-25 MRN: [id number]   Date of Service  02/21/2022  HPI/Events of Note  Patient c/o abdominal pain. Abdomin/Pelvis CT Scan yesterday revealed no acute abdominal/pelvic findings, mass lesions or adenopathy.   eICU Interventions  Plan: Fentanyl 12.5 mcg IV now. Portable abdominal film STAT.     Intervention Category Major Interventions: Other:  Lylah Lantis Cornelia Copa 02/21/2022, 2:07 AM

## 2022-02-22 ENCOUNTER — Encounter (HOSPITAL_COMMUNITY): Payer: Self-pay | Admitting: Critical Care Medicine

## 2022-02-22 LAB — CBC WITH DIFFERENTIAL/PLATELET
Abs Immature Granulocytes: 0.01 10*3/uL (ref 0.00–0.07)
Basophils Absolute: 0 10*3/uL (ref 0.0–0.1)
Basophils Relative: 0 %
Eosinophils Absolute: 0 10*3/uL (ref 0.0–0.5)
Eosinophils Relative: 0 %
HCT: 26.6 % — ABNORMAL LOW (ref 39.0–52.0)
Hemoglobin: 9.3 g/dL — ABNORMAL LOW (ref 13.0–17.0)
Immature Granulocytes: 0 %
Lymphocytes Relative: 20 %
Lymphs Abs: 0.8 10*3/uL (ref 0.7–4.0)
MCH: 35.5 pg — ABNORMAL HIGH (ref 26.0–34.0)
MCHC: 35 g/dL (ref 30.0–36.0)
MCV: 101.5 fL — ABNORMAL HIGH (ref 80.0–100.0)
Monocytes Absolute: 0.5 10*3/uL (ref 0.1–1.0)
Monocytes Relative: 12 %
Neutro Abs: 2.7 10*3/uL (ref 1.7–7.7)
Neutrophils Relative %: 68 %
Platelets: 58 10*3/uL — ABNORMAL LOW (ref 150–400)
RBC: 2.62 MIL/uL — ABNORMAL LOW (ref 4.22–5.81)
RDW: 13.8 % (ref 11.5–15.5)
WBC: 4.1 10*3/uL (ref 4.0–10.5)
nRBC: 0 % (ref 0.0–0.2)

## 2022-02-22 LAB — MAGNESIUM: Magnesium: 2 mg/dL (ref 1.7–2.4)

## 2022-02-22 LAB — CBC
HCT: 26.1 % — ABNORMAL LOW (ref 39.0–52.0)
Hemoglobin: 9.2 g/dL — ABNORMAL LOW (ref 13.0–17.0)
MCH: 35.2 pg — ABNORMAL HIGH (ref 26.0–34.0)
MCHC: 35.2 g/dL (ref 30.0–36.0)
MCV: 100 fL (ref 80.0–100.0)
Platelets: 59 10*3/uL — ABNORMAL LOW (ref 150–400)
RBC: 2.61 MIL/uL — ABNORMAL LOW (ref 4.22–5.81)
RDW: 13.8 % (ref 11.5–15.5)
WBC: 4.2 10*3/uL (ref 4.0–10.5)
nRBC: 0 % (ref 0.0–0.2)

## 2022-02-22 LAB — TECHNOLOGIST SMEAR REVIEW: Plt Morphology: NORMAL

## 2022-02-22 LAB — GLUCOSE, CAPILLARY
Glucose-Capillary: 121 mg/dL — ABNORMAL HIGH (ref 70–99)
Glucose-Capillary: 117 mg/dL — ABNORMAL HIGH (ref 70–99)
Glucose-Capillary: 106 mg/dL — ABNORMAL HIGH (ref 70–99)
Glucose-Capillary: 114 mg/dL — ABNORMAL HIGH (ref 70–99)
Glucose-Capillary: 104 mg/dL — ABNORMAL HIGH (ref 70–99)

## 2022-02-22 LAB — RENAL FUNCTION PANEL
Albumin: 2.4 g/dL — ABNORMAL LOW (ref 3.5–5.0)
Anion gap: 12 (ref 5–15)
BUN: 25 mg/dL — ABNORMAL HIGH (ref 8–23)
CO2: 29 mmol/L (ref 22–32)
Calcium: 6.9 mg/dL — ABNORMAL LOW (ref 8.9–10.3)
Chloride: 95 mmol/L — ABNORMAL LOW (ref 98–111)
Creatinine, Ser: 2.84 mg/dL — ABNORMAL HIGH (ref 0.61–1.24)
GFR, Estimated: 23 mL/min — ABNORMAL LOW (ref >60)
Glucose, Bld: 121 mg/dL — ABNORMAL HIGH (ref 70–99)
Phosphorus: 2.2 mg/dL — ABNORMAL LOW (ref 2.5–4.6)
Potassium: 3.3 mmol/L — ABNORMAL LOW (ref 3.5–5.1)
Sodium: 136 mmol/L (ref 135–145)

## 2022-02-22 MED ORDER — INSULIN ASPART 100 UNIT/ML IJ SOLN: 0.0000 [IU] | Freq: Three times a day (TID) | INTRAMUSCULAR | Status: DC

## 2022-02-22 MED ORDER — TAMSULOSIN HCL 0.4 MG PO CAPS
0.4000 mg | ORAL_CAPSULE | Freq: Every day | ORAL | Status: DC
  Administered 2022-02-22 – 2022-02-25 (×4): 0.4 mg via ORAL
  Filled 2022-02-22 (×4): qty 1

## 2022-02-22 MED ORDER — LOPERAMIDE HCL 2 MG PO CAPS
2.0000 mg | ORAL_CAPSULE | ORAL | Status: DC | PRN
  Administered 2022-02-22: 2 mg via ORAL
  Filled 2022-02-22: qty 1

## 2022-02-22 MED ORDER — AMLODIPINE BESYLATE 5 MG PO TABS
5.0000 mg | ORAL_TABLET | Freq: Every day | ORAL | Status: DC
  Administered 2022-02-22 – 2022-02-25 (×4): 5 mg via ORAL
  Filled 2022-02-22 (×4): qty 1

## 2022-02-22 MED ORDER — HEPARIN SODIUM (PORCINE) 1000 UNIT/ML DIALYSIS
1000.0000 [IU] | INTRAMUSCULAR | Status: DC | PRN
  Administered 2022-02-22: 2400 [IU] via INTRAVENOUS_CENTRAL
  Filled 2022-02-22: qty 6
  Filled 2022-02-22: qty 3

## 2022-02-22 MED ORDER — SALINE SPRAY 0.65 % NA SOLN
1.0000 | NASAL | Status: DC | PRN
  Administered 2022-02-22: 1 via NASAL
  Filled 2022-02-22: qty 44

## 2022-02-22 MED ORDER — TAMSULOSIN HCL 0.4 MG PO CAPS: 0.4000 mg | ORAL_CAPSULE | Freq: Every day | ORAL | Status: DC

## 2022-02-22 NOTE — Progress Notes (Signed)
NAME:  Jose [name], MRN:  [id number], DOB:  15-Dec-1950, LOS: 2 ADMISSION DATE:  02/20/2022, CONSULTATION DATE:  12/19 REFERRING MD:  Dr. Vanita Panda EDP, CHIEF COMPLAINT:  Renal failure   History of Present Illness:  71 year old male with PMH as below, which is significant for DM, HTN, CAD. He was in his usual state of health until 12/15 when he developed nausea and vomiting which persisted all the way through to presentation on 12/19. Family reports he has not been able to keep a drop of fluid or a bite of food down since that time. Presented 12/19 with complaints of chest pain, abdominal pain, lightheadedness, and diaphoresis. Workup in the ED included CT of the abdomen and pelvis which did not describe any acute issues. Laboratory evaluation was significant for K 7.3, CO2 <7, BUN 73, Creatinine 10.95, lactic acid > 9. The patient also become hypotensive despite 3L IVF resuscitation and was started on norepinephrine infusion.  PCCM was asked to evaluate in the setting of lactic acidosis and hyperkalemia.   Pertinent  Medical History   has a past medical history of ALCOHOL ABUSE (07/27/2008), Allergy, ANXIETY (07/27/2008), Arthritis, Bifascicular block, CAD (07/27/2008), Cataract, Diabetes mellitus without complication ([place]), GERD (09/18/2006), History of TIA (transient ischemic attack) (07/2008), adenomatous colonic polyps, cardiovascular stress test, Hyperlipidemia, HYPERTENSION (09/18/2006), LOW BACK PAIN (09/18/2006), Myocardial infarction ([place]) (2010), Numbness and tingling, Rosacea (07/27/2008), SEIZURE DISORDER (09/18/2006), Squamous cell carcinoma of skin (07/19/2021), THROMBOCYTOPENIA (07/27/2008), and Urinary tract infection (start cipro 01-07-2012).  Significant Hospital Events: Including procedures, antibiotic start and stop dates in addition to other pertinent events   12/19: nephrology initiated CRRT  12/20: stopped cefepime and doxycycline  Interim History / Subjective:  Patient  reports feeling better. He is urinating and having loose bowel movements. No new concerns.    Objective   Blood pressure 134/61, pulse 90, temperature 98.2 F (36.8 C), temperature source Oral, resp. rate 15, height '5\' 6"'$  (1.676 m), weight 84.9 kg, SpO2 98 %.        Intake/Output Summary (Last 24 hours) at 02/22/2022 4081 Last data filed at 02/22/2022 0700 Gross per 24 hour  Intake 950.99 ml  Output 1606 ml  Net -655.01 ml    Filed Weights   02/20/22 1900 02/21/22 0500 02/22/22 0500  Weight: 84.3 kg 84.3 kg 84.9 kg    Examination: General: alert, in no acute distress Lungs: clear to auscultation bilaterally, normal work of breathing on RA Cardiovascular: RRR Extremities: trace LE edema Neuro: alert, oriented  Resolved Hospital Problem list   Hyperkalemia  Metabolic Acidosis  Assessment & Plan:  Shock Metabolic acidosis Lactic acidosis Suspect shock related to severity of acidosis. Lactic acid on initial was >9. On CRRT, plan to stop it today. Remains afebrile and normal WBC. Off of pressors. Blood cultures so far no growth x 2 days.  -f/u blood cultures -anticipate transfer out of ICU today  AKI  Likely in setting of recent n/v, ACEi and metformin. Hypovolemic on initial presentation. CT CAP did not show any renal changes to suggest obstruction. Continues to have urine output. BUN and creatinine improving.  -appreciate nephrology assistance -plan to stop CRRT today -strict I/O -renal function panel   Anemia Thrombocytopenia Hgb up to 9.2. Platelets dropped to 59 from 95. Patient on CRRT, could contribute to drop. Pending peripheral smear. Has been on heparin before and no hx of HIT.  -CBC -if Hgb < 7, consider transfusion   T2DM Last A1c 5.5% -hold home metformin -  on SSI  QTc prolongation EKG from 12/19 showed QTc of 561. Avoid QT prolonging medications.   CAD HTN -hold home ASA, benazepril and bisoprolol -restart amlodipine given elevated BP  Best  Practice (right click and "Reselect all SmartList Selections" daily)   Diet/type: Regular consistency (see orders) DVT prophylaxis: prophylactic heparin  GI prophylaxis: N/A Lines: Dialysis Catheter Foley:  N/A Code Status:  full code Last date of multidisciplinary goals of care discussion [12/20: son at bedside]  Labs   CBC: Recent Labs  Lab 02/20/22 1043 02/20/22 1307 02/20/22 1603 02/20/22 2112 02/20/22 2330 02/21/22 0326 02/21/22 0535 02/22/22 0314  WBC 8.1  --  7.5  --   --  6.9  --  4.2  NEUTROABS  --   --  4.4  --   --  5.0  --   --   HGB 10.7*   < > 9.7* 10.2* 10.2* 9.5* 8.5* 9.2*  HCT 36.5*   < > 32.0* 30.0* 30.0* 26.9* 25.0* 26.1*  MCV 121.7*  --  116.4*  --   --  102.3*  --  100.0  PLT 139*  --  141*  --   --  95*  --  59*   < > = values in this interval not displayed.     Basic Metabolic Panel: Recent Labs  Lab 02/21/22 0101 02/21/22 0326 02/21/22 0535 02/21/22 1003 02/21/22 1558 02/22/22 0314  NA 142 142 138 141 137 136  K 4.2 4.0 3.9 4.1 3.9 3.3*  CL 94* 96*  --  100 96* 95*  CO2 10* 13*  --  19* 24 29  GLUCOSE 118* 73  --  101* 152* 121*  BUN 59* 51*  --  43* 36* 25*  CREATININE 7.84* 6.74*  --  5.27* 4.28* 2.84*  CALCIUM 7.4* 7.4*  --  7.0* 6.9* 6.9*  MG  --  1.7  --   --   --  2.0  PHOS 7.5* 6.1*  6.1*  --   --  3.9 2.2*    GFR: Estimated Creatinine Clearance: 25.1 mL/min (A) (by C-G formula based on SCr of 2.84 mg/dL (H)). Recent Labs  Lab 02/20/22 1043 02/20/22 1300 02/20/22 1603 02/21/22 0326 02/22/22 0314  WBC 8.1  --  7.5 6.9 4.2  LATICACIDVEN >9.0* >9.0*  --   --   --      Liver Function Tests: Recent Labs  Lab 02/20/22 1043 02/21/22 0326 02/21/22 1558 02/22/22 0314  AST 45*  --   --   --   ALT 44  --   --   --   ALKPHOS 45  --   --   --   BILITOT 0.9  --   --   --   PROT 5.1*  --   --   --   ALBUMIN 2.9* 2.5* 2.4* 2.4*    No results for input(s): "LIPASE", "AMYLASE" in the last 168 hours. No results for  input(s): "AMMONIA" in the last 168 hours.  ABG    Component Value Date/Time   PHART 7.408 02/21/2022 0535   PCO2ART 22.3 (L) 02/21/2022 0535   PO2ART 78 (L) 02/21/2022 0535   HCO3 14.1 (L) 02/21/2022 0535   TCO2 15 (L) 02/21/2022 0535   ACIDBASEDEF 9.0 (H) 02/21/2022 0535   O2SAT 96 02/21/2022 0535     Coagulation Profile: No results for input(s): "INR", "PROTIME" in the last 168 hours.  Cardiac Enzymes: No results for input(s): "CKTOTAL", "CKMB", "CKMBINDEX", "TROPONINI" in the last  168 hours.  HbA1C: Hemoglobin A1C  Date/Time Value Ref Range Status  05/31/2021 09:16 AM 5.5 4.0 - 5.6 % Final   Hgb A1c MFr Bld  Date/Time Value Ref Range Status  07/05/2020 09:29 AM 5.7 4.6 - 6.5 % Final    Comment:    Glycemic Control Guidelines for People with Diabetes:Non Diabetic:  <6%Goal of Therapy: <7%Additional Action Suggested:  >8%   06/23/2019 07:42 AM 5.5 4.6 - 6.5 % Final    Comment:    Glycemic Control Guidelines for People with Diabetes:Non Diabetic:  <6%Goal of Therapy: <7%Additional Action Suggested:  >8%     CBG: Recent Labs  Lab 02/21/22 1128 02/21/22 1534 02/21/22 1916 02/21/22 2300 02/22/22 0315  GLUCAP 108* 159* 130* 132* 121*     Past Medical History:  He,  has a past medical history of ALCOHOL ABUSE (07/27/2008), Allergy, ANXIETY (07/27/2008), Arthritis, Bifascicular block, CAD (07/27/2008), Cataract, Diabetes mellitus without complication ([place]), GERD (09/18/2006), History of TIA (transient ischemic attack) (07/2008), adenomatous colonic polyps, cardiovascular stress test, Hyperlipidemia, HYPERTENSION (09/18/2006), LOW BACK PAIN (09/18/2006), Myocardial infarction ([place]) (2010), Numbness and tingling, Rosacea (07/27/2008), SEIZURE DISORDER (09/18/2006), Squamous cell carcinoma of skin (07/19/2021), THROMBOCYTOPENIA (07/27/2008), and Urinary tract infection (start cipro 01-07-2012).   Surgical History:   Past Surgical History:  Procedure Laterality Date    CORONARY STENT PLACEMENT     drug eluting   GOLD SEED IMPLANT N/A 04/11/2021   Procedure: GOLD SEED IMPLANT SPACE OAR;  Surgeon: Robley Fries, MD;  Location[facility];  Service: Urology;  Laterality: N/A;   HERNIA REPAIR     INGUINAL HERNIA REPAIR  01/14/2012   Procedure: LAPAROSCOPIC INGUINAL HERNIA;  Surgeon: Ralene Ok, MD;  Location: [facility];  Service: General;  Laterality: Right;   INSERTION OF MESH  01/14/2012   Procedure: INSERTION OF MESH;  Surgeon: Ralene Ok, MD;  Location: [facility];  Service: General;  Laterality: Right;   KNEE SURGERY     LEFT HEART CATHETERIZATION WITH CORONARY ANGIOGRAM N/A 10/21/2013   Procedure: LEFT HEART CATHETERIZATION WITH CORONARY ANGIOGRAM;  Surgeon: Blane Ohara, MD;  Location[facility];  Service: Cardiovascular;  Laterality: N/A;   MEDIAL PARTIAL KNEE REPLACEMENT Left      Social History:   reports that he has never smoked. He quit smokeless tobacco use about 10 years ago.  His smokeless tobacco use included chew. He reports that he does not drink alcohol and does not use drugs.   Family History:  His family history includes Diabetes in his mother; Emphysema in his father; Heart attack in an other family member; Heart failure in his father; Hyperlipidemia in his mother; Hypertension in his mother; Lung cancer in his mother; Melanoma in his paternal grandmother and sister. There is no history of Colon cancer, Stroke, Esophageal cancer, Liver cancer, Pancreatic cancer, Stomach cancer, or Rectal cancer.   Allergies No Known Allergies   Home Medications  Prior to Admission medications   Medication Sig Start Date End Date Taking? Authorizing Provider  ALPRAZolam (XANAX) 0.5 MG tablet TAKE 1 TABLET BY MOUTH THREE TIMES A DAY AS NEEDED Patient taking differently: Take 0.5 mg by mouth 3 (three) times daily as needed for anxiety. 02/01/21   [name], [name], MD  amLODipine (NORVASC) 5 MG tablet TAKE 1 TABLET (5  MG TOTAL) BY MOUTH DAILY. 10/05/21   [name], [name], MD  aspirin EC 81 MG tablet Take 81 mg by mouth at bedtime. Swallow whole.  [provider]  atorvastatin (LIPITOR) 20 MG tablet TAKE 1 TABLET BY MOUTH EVERY DAY 12/14/21   [name], [name], MD  Azelaic Acid 15 % cream APPLY 1 APPLICATION TOPICALLY AS DIRECTED. AFTER SKIN IS THOROUGHLY WASHED AND PATTED DRY, GENTLY BUT THOROUGHLY MASSAGE A THIN FILM OF AZELAIC ACID INTO THE AFFECTED AREA TWICE DAILY, IN THE MORNING AND EVENING. APPLIED TO FACE. 10/22/18   [name], [name], MD  benazepril (LOTENSIN) 40 MG tablet TAKE 1 TABLET BY MOUTH EVERY DAY 02/12/22   [name], [name], MD  bisoprolol (ZEBETA) 10 MG tablet Take 1 tablet (10 mg total) by mouth daily. 08/07/21   [name], MD  cholecalciferol (VITAMIN D) 25 MCG (1000 UNIT) tablet Take 1,000 Units by mouth daily.    [provider]  diclofenac sodium (VOLTAREN) 1 % GEL Apply 2 g topically 2 (two) times daily as needed (joint pain (knees)).     [provider]  EPINEPHrine 0.3 mg/0.3 mL IJ SOAJ injection Inject 0.3 mg into the muscle as needed for anaphylaxis. 09/05/21   Dorie Rank, MD  glucose blood (ONETOUCH VERIO) test strip USE TO TEST BLOOD SUGAR ONCE A DAY DX E11.51 - 12/06/21   [name], [name], MD  halobetasol (ULTRAVATE) 0.05 % cream Apply 1 application. topically 2 (two) times daily as needed (dry skin).    [provider]  Lancets [facility] DELICA PLUS [id number]) [place] 1 each by Other route daily. Dx e11.9 12/05/21   [name], [name], MD  metFORMIN (GLUCOPHAGE) 1000 MG tablet TAKE 1 TABLET (1,000 MG TOTAL) BY MOUTH TWICE A DAY WITH FOOD 01/17/22   [name], [name], MD  Multiple Vitamin (MULTIVITAMIN WITH MINERALS) TABS Take 1 tablet by mouth every evening.    [provider]  nitroGLYCERIN (NITROSTAT) 0.4 MG SL tablet DISSOLVE 1 TABLET BY MOUTH UNDER THE TONGUE EVERY 5 MINUTES  AS NEEDED FOR CHEST PAIN 12/05/21   [name], [name], MD  ondansetron (ZOFRAN) 8 MG tablet Take 1 tablet (8 mg total) by mouth every 8 (eight) hours as needed for nausea or vomiting. 05/23/21   Bruning, Ashlyn, PA-C  tadalafil (CIALIS) 20 MG tablet Take 20 mg by mouth as needed.    [provider]  tamsulosin (FLOMAX) 0.4 MG CAPS capsule Take 0.4 mg by mouth every evening. 05/26/21   [provider]  triamcinolone cream (KENALOG) 0.1 % Apply 1 Application topically 2 (two) times daily.    [provider]  vitamin C (ASCORBIC ACID) 500 MG tablet Take 1,000 mg by mouth daily. Reported on 08/23/2015    [provider]

## 2022-02-22 NOTE — Evaluation (Signed)
Physical Therapy Evaluation Patient Details Name: Jose [name] MRN: [id number] DOB: April 02, 1950 Today's Date: 02/22/2022  History of Present Illness  71 yo male admitted 12/19 with back and chest pain, N/V with AKI and sepsis. CRRT started 12/19. PMHx: CAD, DM, HTN  Clinical Impression  Pt pleasant limited by CRRT and incontinent stool. 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 Pt limited by lines and anticipate will progress well once liberated from CRRT. Pt will benefit from acute therapy to maximize mobility, safety and function to decrease burden of care. Encouraged OOB daily and up to [facility].          Recommendations for follow up therapy are one component of a multi-disciplinary discharge planning process, led by the attending physician.  Recommendations may be updated based on patient status, additional functional criteria and insurance authorization.  Follow Up Recommendations No PT follow up      Assistance Recommended at Discharge PRN  Patient can return home with the following       Equipment Recommendations None recommended by PT  Recommendations for Other Services       Functional Status Assessment Patient has had a recent decline in their functional status and demonstrates the ability to make significant improvements in function in a reasonable and predictable amount of time.     Precautions / Restrictions Precautions Precautions: Other (comment) Precaution Comments: CRRT, incontinent stool Restrictions Weight Bearing Restrictions: No      Mobility  Bed Mobility               General bed mobility comments: in chair on arrival and end of session    Transfers Overall transfer level: Needs assistance   Transfers: Sit to/from Stand Sit to Stand: Supervision           General transfer comment: supervision for lines and safety    Ambulation/Gait Ambulation/Gait assistance: Min guard Gait Distance (Feet): 110  Feet Assistive device: None Gait Pattern/deviations: Step-through pattern   Gait velocity interpretation: 1.31 - 2.62 ft/sec, indicative of limited community ambulator   General Gait Details: pt performed forward, backward and bil side stepping within length of CRRT without physical assist, guarding for lines and safety. Limited by incontinent stool  Stairs            Wheelchair Mobility    Modified Rankin (Stroke Patients Only)       Balance Overall balance assessment: No apparent balance deficits (not formally assessed)                                           Pertinent Vitals/Pain Pain Assessment Pain Assessment: No/denies pain    Home Living Family/patient expects to be discharged to:: Private residence Living Arrangements: Spouse/significant other Available Help at Discharge: Family;Available 24 hours/day Type of Home: House Home Access: Stairs to enter   [employer] of Steps: 6   Home Layout: Two level;Able to live on main level with bedroom/bathroom Home Equipment: Rolling Walker (2 wheels);Crutches 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    Prior Function Prior Level of Function : Independent/Modified Independent                     Hand Dominance        Extremity/Trunk Assessment   Upper Extremity Assessment Upper Extremity Assessment: Overall WFL for tasks assessed  Lower Extremity Assessment Lower Extremity Assessment: Overall WFL for tasks assessed    Cervical / Trunk Assessment Cervical / Trunk Assessment: Normal  Communication   Communication: No difficulties  Cognition Arousal/Alertness: Awake/alert Behavior During Therapy: WFL for tasks assessed/performed Overall Cognitive Status: Within Functional Limits for tasks assessed                                          General Comments      Exercises     Assessment/Plan    PT Assessment Patient needs continued PT  services  PT Problem List Decreased mobility;Decreased activity tolerance       PT Treatment Interventions Gait training;Stair training;Functional mobility training;Patient/family education;Therapeutic activities    PT Goals (Current goals can be found in the Care Plan section)  Acute Rehab PT Goals Patient Stated Goal: return home and to church PT Goal Formulation: With patient Time For Goal Achievement: 03/01/22 Potential to Achieve Goals: Good    Frequency Min 3X/week     Co-evaluation               AM-PAC PT "6 Clicks" Mobility  Outcome Measure Help needed turning from your back to your side while in a flat bed without using bedrails?: None Help needed moving from lying on your back to sitting on the side of a flat bed without using bedrails?: None Help needed moving to and from a bed to a chair (including a wheelchair)?: None Help needed standing up from a chair using your arms (e.g., wheelchair or bedside chair)?: A Little Help needed to walk in hospital room?: A Little Help needed climbing 3-5 steps with a railing? : A Lot 6 Click Score: 20    End of Session   Activity Tolerance: Patient tolerated treatment well Patient left: in chair;with call bell/phone within reach;with chair alarm set Nurse Communication: Mobility status PT Visit Diagnosis: Other abnormalities of gait and mobility (R26.89)    Time: 7948-0165 PT Time Calculation (min) (ACUTE ONLY): 21 min   Charges:   PT Evaluation $PT Eval Moderate Complexity: 1 Mod          [place], PT Acute Rehabilitation Services Office: [place] 02/22/2022, 9:05 AM

## 2022-02-22 NOTE — Progress Notes (Signed)
[facility] Progress Note   Assessment/ Plan:   AKI: in the setting of n/v/d, ACEi, and metformin.  Hypovolemic in the setting of n/v/d.  Aggressive fluid resuscitation and K management.  pre-renal insult +/- metformin tox             - didn't respond to aggressive supportive care  - on CRRT- 12/19-12/21- improving, good UOP, take down today  - making urine now  - will follow UOP and rate of rise after takedown   2.  Hyperkalemia:             - resolved with CRRT   3.  Shock             - likely septic?             - no dissection             - broad spectrum abx per PCCM  - getting TTE  4.  Metabolic acidosis  -  resolved   5.  Dispo: to ICU  Subjective:    Much better, UOP good and labs better too.     Objective:   BP 133/80   Pulse 77   Temp 98.1 F (36.7 C) (Oral)   Resp 19   Ht '5\' 6"'$  (1.676 m)   Wt 84.9 kg   SpO2 98%   BMI 30.21 kg/m   Intake/Output Summary (Last 24 hours) at 02/22/2022 1020 Last data filed at 02/22/2022 0900 Gross per 24 hour  Intake 378.11 ml  Output 1456 ml  Net -1077.89 ml   Weight change: 0.6 kg  Physical Exam: GEN sitting up in bed, alert and talkative HEENT EOMI PERRL NECK no JVD, flatneck veins PULM clear CV tRRR ABD nontender EXT 1+ LE edema NEURO AAO x 3 nonfocal SKIN better color in face now  Imaging: ECHOCARDIOGRAM COMPLETE  Result Date: 02/21/2022    ECHOCARDIOGRAM REPORT   Patient Name:   Jose [name] Date of Exam: 02/21/2022 Medical Rec #:  [id number]    Height:       66.0 in Accession #:    [id number]   Weight:       185.8 lb Date of Birth:  1950/12/21    BSA:          1.938 m Patient Age:    71 years     BP:           108/50 mmHg Patient Gender: M            HR:           81 bpm. Exam Location:  Inpatient Procedure: 2D Echo, Cardiac Doppler and Color Doppler Indications:    Shock  History:        Patient has no prior history of Echocardiogram examinations. CAD                 and Previous Myocardial  Infarction, TIA; Risk Factors:Diabetes,                 ETOH, Hypertension and Dyslipidemia.  Sonographer:    Eartha Inch Referring Phys: [id number] LAURA P CLARK  Sonographer Comments: Technically difficult study due to poor echo windows. Image acquisition challenging due to patient body habitus and Image acquisition challenging due to respiratory motion. IMPRESSIONS  1. Left ventricular ejection fraction, by estimation, is 70 to 75%. Left ventricular ejection fraction by 2D MOD biplane is 70.2 %. The left ventricle has hyperdynamic function. The left ventricle  has no regional wall motion abnormalities. There is mild  left ventricular hypertrophy. Left ventricular diastolic parameters are consistent with Grade I diastolic dysfunction (impaired relaxation).  2. Right ventricular systolic function is mildly reduced. The right ventricular size is normal.  3. The mitral valve is abnormal. Trivial mitral valve regurgitation.  4. The aortic valve is tricuspid. Aortic valve regurgitation is not visualized. Aortic valve sclerosis is present, with no evidence of aortic valve stenosis. Aortic valve mean gradient measures 7.0 mmHg.  5. The inferior vena cava is normal in size with greater than 50% respiratory variability, suggesting right atrial pressure of 3 mmHg.  6. Increased flow velocities may be secondary to anemia, thyrotoxicosis, hyperdynamic or high flow state. Comparison(s): No prior Echocardiogram. FINDINGS  Left Ventricle: Left ventricular ejection fraction, by estimation, is 70 to 75%. Left ventricular ejection fraction by 2D MOD biplane is 70.2 %. The left ventricle has hyperdynamic function. The left ventricle has no regional wall motion abnormalities. The left ventricular internal cavity size was normal in size. There is mild left ventricular hypertrophy. Left ventricular diastolic parameters are consistent with Grade I diastolic dysfunction (impaired relaxation). Indeterminate filling pressures. Right  Ventricle: The right ventricular size is normal. No increase in right ventricular wall thickness. Right ventricular systolic function is mildly reduced. Left Atrium: Left atrial size was normal in size. Right Atrium: Right atrial size was normal in size. Pericardium: There is no evidence of pericardial effusion. Mitral Valve: The mitral valve is abnormal. Mild mitral annular calcification. Trivial mitral valve regurgitation. Tricuspid Valve: The tricuspid valve is grossly normal. Tricuspid valve regurgitation is not demonstrated. Aortic Valve: The aortic valve is tricuspid. Aortic valve regurgitation is not visualized. Aortic valve sclerosis is present, with no evidence of aortic valve stenosis. Aortic valve mean gradient measures 7.0 mmHg. Aortic valve peak gradient measures 11.7 mmHg. Aortic valve area, by VTI measures 3.29 cm. Pulmonic Valve: The pulmonic valve was grossly normal. Pulmonic valve regurgitation is not visualized. Aorta: The aortic root and ascending aorta are structurally normal, with no evidence of dilitation. Venous: The inferior vena cava is normal in size with greater than 50% respiratory variability, suggesting right atrial pressure of 3 mmHg. IAS/Shunts: No atrial level shunt detected by color flow Doppler.  LEFT VENTRICLE PLAX 2D                        Biplane EF (MOD) LVIDd:         4.60 cm         LV Biplane EF:   Left LVIDs:         2.60 cm                          ventricular LV PW:         1.10 cm                          ejection LV IVS:        0.90 cm                          fraction by LVOT diam:     2.20 cm                          2D MOD LV SV:         112  biplane is LV SV Index:   58                               70.2 %. LVOT Area:     3.80 cm                                Diastology                                LV e' medial:    7.07 cm/s LV Volumes (MOD)               LV E/e' medial:  13.2 LV vol d, MOD    116.0 ml      LV e' lateral:   9.68  cm/s A2C:                           LV E/e' lateral: 9.6 LV vol d, MOD    130.0 ml A4C: LV vol s, MOD    35.7 ml A2C: LV vol s, MOD    38.1 ml A4C: LV SV MOD A2C:   80.3 ml LV SV MOD A4C:   130.0 ml LV SV MOD BP:    88.8 ml RIGHT VENTRICLE            IVC RV S prime:     9.03 cm/s  IVC diam: 2.30 cm TAPSE (M-mode): 2.4 cm LEFT ATRIUM             Index        RIGHT ATRIUM           Index LA diam:        4.50 cm 2.32 cm/m   RA Area:     15.80 cm LA Vol (A2C):   48.0 ml 24.76 ml/m  RA Volume:   35.00 ml  18.06 ml/m LA Vol (A4C):   52.5 ml 27.08 ml/m LA Biplane Vol: 51.2 ml 26.41 ml/m  AORTIC VALVE AV Area (Vmax):    3.18 cm AV Area (Vmean):   3.11 cm AV Area (VTI):     3.29 cm AV Vmax:           171.00 cm/s AV Vmean:          126.000 cm/s AV VTI:            0.340 m AV Peak Grad:      11.7 mmHg AV Mean Grad:      7.0 mmHg LVOT Vmax:         143.00 cm/s LVOT Vmean:        103.000 cm/s LVOT VTI:          0.294 m LVOT/AV VTI ratio: 0.86  AORTA Ao Root diam: 3.20 cm Ao Asc diam:  3.30 cm MITRAL VALVE MV Area (PHT): 4.10 cm    SHUNTS MV Decel Time: 185 msec    Systemic VTI:  0.29 m MV E velocity: 93.30 cm/s  Systemic Diam: 2.20 cm MV A velocity: 56.10 cm/s MV E/A ratio:  1.66 Lyman Bishop MD Electronically signed by Lyman Bishop MD Signature Date/Time: 02/21/2022/10:55:57 AM    Final    DG Abd Portable 1V  Result Date: 02/21/2022 CLINICAL DATA:  Abdominal pain EXAM: PORTABLE ABDOMEN - 1 VIEW COMPARISON:  CTA abdomen/pelvis dated 02/20/2022 FINDINGS: Contrast in the gastric cardia. Nonobstructive bowel gas pattern. Visualized osseous structures are within normal limits. IMPRESSION: Negative. Electronically Signed   By: Julian Hy M.D.   On: 02/21/2022 02:29   DG Chest 1 View  Result Date: 02/20/2022 CLINICAL DATA:  Line placement. EXAM: CHEST  1 VIEW COMPARISON:  Same day. FINDINGS: Interval placement of right internal jugular catheter with distal tip in expected position of the SVC. No  pneumothorax is noted. IMPRESSION: Interval placement of right internal jugular catheter with distal tip in expected position of the SVC. Electronically Signed   By: Marijo Conception M.D.   On: 02/20/2022 17:37   CT Angio Chest/Abd/Pel for Dissection W and/or Wo Contrast  Result Date: 02/20/2022 CLINICAL DATA:  Chest pain and back pain. Possible acute aortic syndrome. EXAM: CT ANGIOGRAPHY CHEST, ABDOMEN AND PELVIS TECHNIQUE: Non-contrast CT of the chest was initially obtained. Multidetector CT imaging through the chest, abdomen and pelvis was performed using the standard protocol during bolus administration of intravenous contrast. Multiplanar reconstructed images and MIPs were obtained and reviewed to evaluate the vascular anatomy. RADIATION DOSE REDUCTION: This exam was performed according to the departmental dose-optimization program which includes automated exposure control, adjustment of the mA and/or kV according to patient size and/or use of iterative reconstruction technique. CONTRAST:  164m OMNIPAQUE IOHEXOL 350 MG/ML SOLN COMPARISON:  Abdominal/pelvic CT scan 06/04/2021 FINDINGS: CTA CHEST FINDINGS Examination is limited by breathing motion artifact in the chest, abdomen and pelvis. Cardiovascular: The heart is normal in size. No pericardial effusion. The aorta is normal in caliber. No dissection. Scattered atherosclerotic calcifications. The aortic branch vessels are patent. There are age advanced three-vessel coronary artery calcifications and calcifications around the aortic valve. The pulmonary arteries are unremarkable. No findings for pulmonary embolism. Mediastinum/Nodes: No mediastinal or hilar mass or lymphadenopathy. The esophagus is grossly normal. Lungs/Pleura: No acute pulmonary process. No pulmonary edema, pulmonary infiltrates or pleural effusions. Significant breathing motion artifact noted at the lung bases in particular. Musculoskeletal: No chest wall mass, supraclavicular or  axillary adenopathy. The bony thorax is intact. Advanced degenerative changes at the right sternoclavicular joint. Review of the MIP images confirms the above findings. CTA ABDOMEN AND PELVIS FINDINGS VASCULAR Aorta: Normal caliber. No dissection. Moderate scattered atherosclerotic calcifications. Celiac: Ostial atherosclerotic calcifications with mild stenosis. SMA: Advanced atherosclerotic calcification involving the proximal artery with probable greater than 50% stenosis at the origin. Renals: 2 left renal arteries and 3 right renal arteries with atherosclerotic calcifications but no occlusion or significant stenosis. IMA: Patent Inflow: Moderate atherosclerotic calcifications but no aneurysm, dissection or significant stenosis. Veins: The major venous structures are grossly patent. Review of the MIP images confirms the above findings. NON-VASCULAR Hepatobiliary: Diffuse and marked fatty infiltration of the liver. No hepatic lesions or intrahepatic biliary dilatation. The gallbladder is grossly normal. No common bile duct dilatation. Pancreas: No mass, inflammation or ductal dilatation. Spleen: Normal size.  No focal lesions. Adrenals/Urinary Tract: The adrenal glands and kidneys are unremarkable. The bladder is not well distended. No gross abnormality. Stomach/Bowel: The stomach, duodenum, small bowel and colon are grossly normal. Descending and sigmoid colon diverticulosis but no findings for acute diverticulitis. Lymphatic: No abdominal or pelvic lymphadenopathy. Reproductive: The prostate gland and seminal vesicles are grossly normal. Other: Evidence of right inguinal hernia repair. No recurrent hernia. Musculoskeletal: No significant bony findings. 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 Review of the MIP images confirms the above findings. IMPRESSION: 1. No aortic aneurysm or dissection.  2. Age advanced three-vessel coronary artery calcifications and calcifications around the aortic valve. 3. No acute pulmonary  findings. 4. Age advanced atherosclerotic calcifications involving aorta and branch vessels but no aneurysm, dissection or severe stenosis. 5. Diffuse and marked fatty infiltration of the liver. 6. No acute abdominal/pelvic findings, mass lesions or adenopathy. Aortic Atherosclerosis (ICD10-I70.0). Electronically Signed   By: Marijo Sanes M.D.   On: 02/20/2022 12:53   DG Chest 1 View  Result Date: 02/20/2022 CLINICAL DATA:  144615 Pain 154008 EXAM: CHEST  1 VIEW COMPARISON:  09/05/2021. FINDINGS: Cardiac silhouette is unremarkable. No pneumothorax or pleural effusion. The lungs are clear. There are thoracic degenerative changes. IMPRESSION: No acute cardiopulmonary process. Electronically Signed   By: Sammie Bench M.D.   On: 02/20/2022 11:25    Labs: BMET Recent Labs  Lab 02/20/22 1646 02/20/22 2112 02/20/22 2125 02/20/22 2330 02/21/22 0101 02/21/22 0326 02/21/22 0535 02/21/22 1003 02/21/22 1558 02/22/22 0314  NA 138   < > 138 136 142 142 138 141 137 136  K 6.6*   < > 6.0* 4.6 4.2 4.0 3.9 4.1 3.9 3.3*  CL 93*  --  92*  --  94* 96*  --  100 96* 95*  CO2 <7*  --  <7*  --  10* 13*  --  19* 24 29  GLUCOSE 338*  --  307*  --  118* 73  --  101* 152* 121*  BUN 71*  --  69*  --  59* 51*  --  43* 36* 25*  CREATININE 10.13*  --  9.84*  --  7.84* 6.74*  --  5.27* 4.28* 2.84*  CALCIUM 7.5*  --  7.4*  --  7.4* 7.4*  --  7.0* 6.9* 6.9*  PHOS  --   --   --   --  7.5* 6.1*  6.1*  --   --  3.9 2.2*   < > = values in this interval not displayed.   CBC Recent Labs  Lab 02/20/22 1043 02/20/22 1307 02/20/22 1603 02/20/22 2112 02/20/22 2330 02/21/22 0326 02/21/22 0535 02/22/22 0314  WBC 8.1  --  7.5  --   --  6.9  --  4.2  NEUTROABS  --   --  4.4  --   --  5.0  --   --   HGB 10.7*   < > 9.7*   < > 10.2* 9.5* 8.5* 9.2*  HCT 36.5*   < > 32.0*   < > 30.0* 26.9* 25.0* 26.1*  MCV 121.7*  --  116.4*  --   --  102.3*  --  100.0  PLT 139*  --  141*  --   --  95*  --  59*   < > = values in  this interval not displayed.    Medications:     amLODipine  5 mg Oral Daily   Chlorhexidine Gluconate Cloth  6 each Topical Q0600   heparin  5,000 Units Subcutaneous Q8H   insulin aspart  0-15 Units Subcutaneous Q4H    Madelon Lips, MD 02/22/2022, 10:20 AM

## 2022-02-22 NOTE — Progress Notes (Signed)
eLink Physician-Brief Progress Note Patient Name: Jose [name] DOB: 02-Jun-1950 MRN: [id number]   Date of Service  02/22/2022  HPI/Events of Note  Patient has dry nose, asking for saline nasal spray  eICU Interventions  Saline nasal spray ordered prn     Intervention Category Minor Interventions: Routine modifications to care plan (e.g. PRN medications for pain, fever)  Jose [name] Jose [name] 02/22/2022, 5:59 AM

## 2022-02-23 DIAGNOSIS — E119 Type 2 diabetes mellitus without complications: Secondary | ICD-10-CM

## 2022-02-23 DIAGNOSIS — N179 Acute kidney failure, unspecified: Secondary | ICD-10-CM | POA: Diagnosis not present

## 2022-02-23 DIAGNOSIS — R579 Shock, unspecified: Secondary | ICD-10-CM | POA: Diagnosis not present

## 2022-02-23 DIAGNOSIS — E872 Acidosis, unspecified: Secondary | ICD-10-CM | POA: Diagnosis not present

## 2022-02-23 DIAGNOSIS — E875 Hyperkalemia: Secondary | ICD-10-CM | POA: Diagnosis not present

## 2022-02-23 LAB — RENAL FUNCTION PANEL
Albumin: 2.4 g/dL — ABNORMAL LOW (ref 3.5–5.0)
Anion gap: 13 (ref 5–15)
BUN: 29 mg/dL — ABNORMAL HIGH (ref 8–23)
CO2: 29 mmol/L (ref 22–32)
Calcium: 7.2 mg/dL — ABNORMAL LOW (ref 8.9–10.3)
Chloride: 97 mmol/L — ABNORMAL LOW (ref 98–111)
Creatinine, Ser: 2.74 mg/dL — ABNORMAL HIGH (ref 0.61–1.24)
GFR, Estimated: 24 mL/min — ABNORMAL LOW (ref >60)
Glucose, Bld: 105 mg/dL — ABNORMAL HIGH (ref 70–99)
Phosphorus: 2.2 mg/dL — ABNORMAL LOW (ref 2.5–4.6)
Potassium: 3.1 mmol/L — ABNORMAL LOW (ref 3.5–5.1)
Sodium: 139 mmol/L (ref 135–145)

## 2022-02-23 LAB — GLUCOSE, CAPILLARY
Glucose-Capillary: 92 mg/dL (ref 70–99)
Glucose-Capillary: 87 mg/dL (ref 70–99)
Glucose-Capillary: 112 mg/dL — ABNORMAL HIGH (ref 70–99)
Glucose-Capillary: 131 mg/dL — ABNORMAL HIGH (ref 70–99)

## 2022-02-23 MED ORDER — INSULIN ASPART 100 UNIT/ML IJ SOLN: 0.0000 [IU] | Freq: Three times a day (TID) | INTRAMUSCULAR | Status: DC

## 2022-02-23 MED ORDER — POTASSIUM CHLORIDE CRYS ER 20 MEQ PO TBCR
40.0000 meq | EXTENDED_RELEASE_TABLET | Freq: Once | ORAL | Status: AC
  Administered 2022-02-23: 40 meq via ORAL
  Filled 2022-02-23: qty 2

## 2022-02-23 NOTE — Progress Notes (Signed)
[facility]                                                                              Jose [name], is a 71 y.o. male, DOB - 1951-01-13, XNT:[id number] Admit date - 02/20/2022    Outpatient Primary MD for the patient is [name], [name], MD  LOS - 3  days  Chief Complaint  Patient presents with   Chest Pain   Back Pain   Nausea   Emesis   Dizziness       Brief summary   Patient is a 71 year old male with a DM, HTN, CAD was in his usual state of health until 12/15 when he developed nausea and vomiting, persisted all the way through his presentation on 12/19.  Patient's family reported that he was not able to keep a drop of fluid or bite of food down since the symptoms started.  Patient presented on 12/19 with chest pain, abdominal pain, lightheadedness, diaphoresis.  CT abdomen pelvis showed no acute issues. BMET showed potassium 7.3, CO2 less than 7, BUN 73, creatinine 10.95, lactic acid > 9. Patient also became hypotensive despite 3 L IV fluid resuscitation and was started on norepinephrine infusion.  Patient was admitted to ICU by PCCM for shock, lactic acidosis and hyperkalemia, acute kidney injury He was initiated on CRRT on 12/19, stopped on 12/21 Patient was transferred to the floor on 12/21, [facility] assumed care on 12/22   Assessment & Plan    Principal Problem: Shock with lactic acidosis, metabolic acidosis  ([place]) -On presentation, lactic acid> 9, profound dehydration, unclear if septic as infectious workup has so far been unremarkable.  No fevers, no leukocytosis, chest x-ray unremarkable. -Patient was placed on vasopressors, on vasopressin and Levophed, off since 02/21/2022 -BP now stable.  Active Problems: Acute kidney injury with hyperkalemia, uremia -In the setting of profound dehydration, nausea vomiting, ACE inhibitor, metformin, hypovolemia on presentation.  Creatinine initially 10.95-> 11.7 on admission with potassium 7.0 -CT abdomen  pelvis did not show any obstructive uropathy -Patient was placed on IV fluid hydration, nephrology consult was consulted, started on CRRT on 12/19, stopped on 02/2020, creatinine continues to improve, making urine -Creatinine improving, 2.74, BUN 29, CO2 improved to 29, potassium 3.1 -Follow daily renal panel     Type 2 diabetes mellitus without complication, without long-term current use of insulin (HCC) -Hold metformin, continue sliding scale insulin while inpatient Recent Labs    02/22/22 0803 02/22/22 1129 02/22/22 1723 02/22/22 2053 02/23/22 0810 02/23/22 1153  GLUCAP 117* 106* 114* 104* 92 87   Anemia, chronic, macrocytic -Baseline hemoglobin ~13 -Presented with hemoglobin of 10.7, currently stable 9.3 -MCV 101.5, obtain B12, folate  BPH -Continue Flomax   Hypertension -BP now stable, resumed on amlodipine  Obesity  Estimated body mass index is 30.28 kg/m as calculated from the following:   Height as of this encounter: '5\' 6"'$  (1.676 m).   Weight as of this encounter: 85.1 kg.  Code Status: Full code DVT Prophylaxis:  heparin injection 5,000 Units Start: 02/20/22 2200   Level of Care: Level of care: Telemetry Medical Family Communication: Updated patient' Disposition Plan:  Remains inpatient appropriate: Hopefully DC home in next 24 to 48 hours if creatinine continues to improve and does not need any further CRRT   Procedures:  CRRT  Consultants:   Admitted by PCCM, nephrology  Antimicrobials:   Anti-infectives (From admission, onward)    Start     Dose/Rate Route Frequency Ordered Stop   02/21/22 1500  ceFEPIme (MAXIPIME) 1 g in sodium chloride 0.9 % 100 mL IVPB  Status:  Discontinued        1 g 200 mL/hr over 30 Minutes Intravenous Every 24 hours 02/20/22 1412 02/20/22 1918   02/21/22 0600  ceFEPIme (MAXIPIME) 2 g in sodium chloride 0.9 % 100 mL IVPB  Status:  Discontinued        2 g 200 mL/hr over 30 Minutes Intravenous Every 12 hours 02/20/22  1918 02/21/22 1341   02/20/22 1415  doxycycline (VIBRAMYCIN) 100 mg in sodium chloride 0.9 % 250 mL IVPB  Status:  Discontinued        100 mg 125 mL/hr over 120 Minutes Intravenous Every 12 hours 02/20/22 1410 02/21/22 1341   02/20/22 1415  ceFEPIme (MAXIPIME) 2 g in sodium chloride 0.9 % 100 mL IVPB        2 g 200 mL/hr over 30 Minutes Intravenous  Once 02/20/22 1412 02/20/22 1503          Medications  amLODipine  5 mg Oral Daily   Chlorhexidine Gluconate Cloth  6 each Topical Q0600   heparin  5,000 Units Subcutaneous Q8H   insulin aspart  0-15 Units Subcutaneous TID WC   tamsulosin  0.4 mg Oral Daily      Subjective:   Jose [name] was seen and examined today.  Making urine, no chest pain, shortness of breath, fevers or chills, no nausea or vomiting.  Tolerating diet.  CRRT stopped on 12/21.    Objective:   Vitals:   02/23/22 0021 02/23/22 0500 02/23/22 0521 02/23/22 0806  BP: (!) 124/55  (!) 145/79 139/79  Pulse: 80  78 78  Resp:    18  Temp: 97.6 F (36.4 C)  98.6 F (37 C) 98.4 F (36.9 C)  TempSrc:   Oral Oral  SpO2: 100%  98% 97%  Weight:  85.1 kg    Height:        Intake/Output Summary (Last 24 hours) at 02/23/2022 1415 Last data filed at 02/22/2022 1700 Gross per 24 hour  Intake --  Output 200 ml  Net -200 ml     Wt Readings from Last 3 Encounters:  02/23/22 85.1 kg  10/18/21 81.8 kg  09/05/21 77.1 kg     Exam General: Alert and oriented x 3, NAD Cardiovascular: S1 S2 auscultated,  RRR Respiratory: Clear to auscultation bilaterally, no wheezing Gastrointestinal: Soft, nontender, nondistended, + bowel sounds Ext: trace to 1+ pedal edema bilaterally Neuro: no new FND's Psych: Normal affect and demeanor, alert and oriented x3     Data Reviewed:  I have personally reviewed following labs    CBC Lab Results  Component Value Date   WBC 4.2 02/22/2022   WBC 4.1 02/22/2022   RBC 2.61 (L) 02/22/2022   RBC 2.62 (L) 02/22/2022   HGB 9.2  (L) 02/22/2022   HGB 9.3 (L) 02/22/2022   HCT 26.1 (L) 02/22/2022   HCT 26.6 (L) 02/22/2022   MCV 100.0 02/22/2022   MCV 101.5 (H) 02/22/2022   MCH 35.2 (H) 02/22/2022   MCH 35.5 (H) 02/22/2022   PLT 59 (L) 02/22/2022  PLT 58 (L) 02/22/2022   MCHC 35.2 02/22/2022   MCHC 35.0 02/22/2022   RDW 13.8 02/22/2022   RDW 13.8 02/22/2022   LYMPHSABS 0.8 02/22/2022   MONOABS 0.5 02/22/2022   EOSABS 0.0 02/22/2022   BASOSABS 0.0 29/19/1660     Last metabolic panel Lab Results  Component Value Date   NA 139 02/23/2022   K 3.1 (L) 02/23/2022   CL 97 (L) 02/23/2022   CO2 29 02/23/2022   BUN 29 (H) 02/23/2022   CREATININE 2.74 (H) 02/23/2022   GLUCOSE 105 (H) 02/23/2022   GFRNONAA 24 (L) 02/23/2022   GFRAA >60 06/21/2019   CALCIUM 7.2 (L) 02/23/2022   PHOS 2.2 (L) 02/23/2022   PROT 5.1 (L) 02/20/2022   ALBUMIN 2.4 (L) 02/23/2022   BILITOT 0.9 02/20/2022   ALKPHOS 45 02/20/2022   AST 45 (H) 02/20/2022   ALT 44 02/20/2022   ANIONGAP 13 02/23/2022    CBG (last 3)  Recent Labs    02/22/22 2053 02/23/22 0810 02/23/22 1153  GLUCAP 104* 92 87      Coagulation Profile: No results for input(s): "INR", "PROTIME" in the last 168 hours.   Radiology Studies: I have personally reviewed the imaging studies  No results found.     Estill Cotta M.D. [facility] 02/23/2022, 2:15 PM  Available via Epic secure chat 7am-7pm After 7 pm, please refer to night coverage provider listed on amion.

## 2022-02-23 NOTE — Progress Notes (Signed)
[facility] Progress Note   Assessment/ Plan:   AKI: in the setting of n/v/d, ACEi, and metformin.  Hypovolemic in the setting of n/v/d.  Aggressive fluid resuscitation and K management.  pre-renal insult +/- metformin tox             - didn't respond to aggressive supportive care  - on CRRT- 12/19-12/21 - follow labs and UOP - don't think he will need further dialysis going forward- if labs good tomorrow AM will pull HD cath   2.  Hyperkalemia:             - resolved with CRRT  - got K repletion for K of 3.1 this PM- agree   3.  Shock: resolved   4.  Metabolic acidosis  -  resolved   5.  Dispo: on the floor  Subjective:     Urinating and doing well.  Cr 2.7 off dialysis.     Objective:   BP 139/79 (BP Location: Right Arm)   Pulse 78   Temp 98.4 F (36.9 C) (Oral)   Resp 18   Ht '5\' 6"'$  (1.676 m)   Wt 85.1 kg   SpO2 97%   BMI 30.28 kg/m   Intake/Output Summary (Last 24 hours) at 02/23/2022 1514 Last data filed at 02/23/2022 1200 Gross per 24 hour  Intake --  Output 850 ml  Net -850 ml   Weight change: 0.2 kg  Physical Exam: GEN sitting up in bed, alert and talkative HEENT EOMI PERRL NECK no JVD, flatneck veins PULM clear CV tRRR ABD nontender EXT 1+ LE edema NEURO AAO x 3 nonfocal SKIN better color in face now  Imaging: No results found.  Labs: BMET Recent Labs  Lab 02/20/22 2125 02/20/22 2330 02/21/22 0101 02/21/22 0326 02/21/22 0535 02/21/22 1003 02/21/22 1558 02/22/22 0314 02/23/22 0656  NA 138   < > 142 142 138 141 137 136 139  K 6.0*   < > 4.2 4.0 3.9 4.1 3.9 3.3* 3.1*  CL 92*  --  94* 96*  --  100 96* 95* 97*  CO2 <7*  --  10* 13*  --  19* '24 29 29  '$ GLUCOSE 307*  --  118* 73  --  101* 152* 121* 105*  BUN 69*  --  59* 51*  --  43* 36* 25* 29*  CREATININE 9.84*  --  7.84* 6.74*  --  5.27* 4.28* 2.84* 2.74*  CALCIUM 7.4*  --  7.4* 7.4*  --  7.0* 6.9* 6.9* 7.2*  PHOS  --   --  7.5* 6.1*  6.1*  --   --  3.9 2.2* 2.2*    < > = values in this interval not displayed.   CBC Recent Labs  Lab 02/20/22 1043 02/20/22 1307 02/20/22 1603 02/20/22 2112 02/20/22 2330 02/21/22 0326 02/21/22 0535 02/22/22 0314  WBC 8.1  --  7.5  --   --  6.9  --  4.1  4.2  NEUTROABS  --   --  4.4  --   --  5.0  --  2.7  HGB 10.7*   < > 9.7*   < > 10.2* 9.5* 8.5* 9.3*  9.2*  HCT 36.5*   < > 32.0*   < > 30.0* 26.9* 25.0* 26.6*  26.1*  MCV 121.7*  --  116.4*  --   --  102.3*  --  101.5*  100.0  PLT 139*  --  141*  --   --  95*  --  58*  59*   < > = values in this interval not displayed.    Medications:     amLODipine  5 mg Oral Daily   Chlorhexidine Gluconate Cloth  6 each Topical Q0600   heparin  5,000 Units Subcutaneous Q8H   insulin aspart  0-9 Units Subcutaneous TID WC   potassium chloride  40 mEq Oral Once   tamsulosin  0.4 mg Oral Daily    Madelon Lips, MD 02/23/2022, 3:14 PM

## 2022-02-23 NOTE — Care Management Important Message (Signed)
Important Message  Patient Details  Name: Jose [name] MRN: [id number] Date of Birth: October 14, 1950   Medicare Important Message Given:  Yes     Hannah Beat 02/23/2022, 12:52 PM

## 2022-02-24 DIAGNOSIS — R579 Shock, unspecified: Secondary | ICD-10-CM | POA: Diagnosis not present

## 2022-02-24 DIAGNOSIS — E875 Hyperkalemia: Secondary | ICD-10-CM | POA: Diagnosis not present

## 2022-02-24 DIAGNOSIS — E872 Acidosis, unspecified: Secondary | ICD-10-CM | POA: Diagnosis not present

## 2022-02-24 DIAGNOSIS — N179 Acute kidney failure, unspecified: Secondary | ICD-10-CM | POA: Diagnosis not present

## 2022-02-24 LAB — RENAL FUNCTION PANEL
Albumin: 2.2 g/dL — ABNORMAL LOW (ref 3.5–5.0)
Anion gap: 10 (ref 5–15)
BUN: 30 mg/dL — ABNORMAL HIGH (ref 8–23)
CO2: 31 mmol/L (ref 22–32)
Calcium: 7.6 mg/dL — ABNORMAL LOW (ref 8.9–10.3)
Chloride: 99 mmol/L (ref 98–111)
Creatinine, Ser: 2.46 mg/dL — ABNORMAL HIGH (ref 0.61–1.24)
GFR, Estimated: 28 mL/min — ABNORMAL LOW (ref >60)
Glucose, Bld: 112 mg/dL — ABNORMAL HIGH (ref 70–99)
Phosphorus: 2.6 mg/dL (ref 2.5–4.6)
Potassium: 3.4 mmol/L — ABNORMAL LOW (ref 3.5–5.1)
Sodium: 140 mmol/L (ref 135–145)

## 2022-02-24 LAB — CBC
HCT: 28.8 % — ABNORMAL LOW (ref 39.0–52.0)
Hemoglobin: 10.1 g/dL — ABNORMAL LOW (ref 13.0–17.0)
MCH: 36.1 pg — ABNORMAL HIGH (ref 26.0–34.0)
MCHC: 35.1 g/dL (ref 30.0–36.0)
MCV: 102.9 fL — ABNORMAL HIGH (ref 80.0–100.0)
Platelets: 57 10*3/uL — ABNORMAL LOW (ref 150–400)
RBC: 2.8 MIL/uL — ABNORMAL LOW (ref 4.22–5.81)
RDW: 13 % (ref 11.5–15.5)
WBC: 4.8 10*3/uL (ref 4.0–10.5)
nRBC: 0 % (ref 0.0–0.2)

## 2022-02-24 LAB — GLUCOSE, CAPILLARY
Glucose-Capillary: 112 mg/dL — ABNORMAL HIGH (ref 70–99)
Glucose-Capillary: 85 mg/dL (ref 70–99)
Glucose-Capillary: 116 mg/dL — ABNORMAL HIGH (ref 70–99)
Glucose-Capillary: 143 mg/dL — ABNORMAL HIGH (ref 70–99)

## 2022-02-24 LAB — VITAMIN B12: Vitamin B-12: 323 pg/mL (ref 180–914)

## 2022-02-24 LAB — FOLATE: Folate: 18.5 ng/mL (ref >5.9)

## 2022-02-24 MED ORDER — POTASSIUM CHLORIDE CRYS ER 20 MEQ PO TBCR
40.0000 meq | EXTENDED_RELEASE_TABLET | Freq: Once | ORAL | Status: AC
  Administered 2022-02-24: 40 meq via ORAL
  Filled 2022-02-24: qty 2

## 2022-02-24 NOTE — Progress Notes (Signed)
[facility] Progress Note   Assessment/ Plan:   AKI: in the setting of n/v/d, ACEi, and metformin.  Hypovolemic in the setting of n/v/d.  Aggressive fluid resuscitation and K management.  pre-renal insult +/- metformin tox             - didn't respond to aggressive supportive care  - on CRRT- 12/19-12/21 - follow labs and UOP - HD cath pulled - anticipate d/c in AM--> will arrange labs and OP followup with [place] - will sign off for now   2.  Hyperkalemia:             - resolved with CRRT  - got K repletion for K of 3.1 this PM- agree   3.  Shock: resolved   4.  Metabolic acidosis  -  resolved   5.  Dispo: on the floor  Subjective:    Cr coming down again.  HD cath removed.     Objective:   BP (!) 140/75 (BP Location: Left Arm)   Pulse 82   Temp 98.1 F (36.7 C) (Oral)   Resp 16   Ht '5\' 6"'$  (1.676 m)   Wt 84.6 kg   SpO2 94%   BMI 30.10 kg/m   Intake/Output Summary (Last 24 hours) at 02/24/2022 1322 Last data filed at 02/23/2022 2101 Gross per 24 hour  Intake --  Output 700 ml  Net -700 ml   Weight change: -0.5 kg  Physical Exam: GEN sitting up in bed, alert and talkative HEENT EOMI PERRL NECK no JVD, flatneck veins PULM clear CV tRRR ABD nontender EXT 1+ LE edema NEURO AAO x 3 nonfocal SKIN better color in face now  Imaging: No results found.  Labs: BMET Recent Labs  Lab 02/21/22 0101 02/21/22 0326 02/21/22 0535 02/21/22 1003 02/21/22 1558 02/22/22 0314 02/23/22 0656 02/24/22 0202  NA 142 142 138 141 137 136 139 140  K 4.2 4.0 3.9 4.1 3.9 3.3* 3.1* 3.4*  CL 94* 96*  --  100 96* 95* 97* 99  CO2 10* 13*  --  19* '24 29 29 31  '$ GLUCOSE 118* 73  --  101* 152* 121* 105* 112*  BUN 59* 51*  --  43* 36* 25* 29* 30*  CREATININE 7.84* 6.74*  --  5.27* 4.28* 2.84* 2.74* 2.46*  CALCIUM 7.4* 7.4*  --  7.0* 6.9* 6.9* 7.2* 7.6*  PHOS 7.5* 6.1*  6.1*  --   --  3.9 2.2* 2.2* 2.6   CBC Recent Labs  Lab 02/20/22 1603 02/20/22 2112  02/21/22 0326 02/21/22 0535 02/22/22 0314 02/24/22 0202  WBC 7.5  --  6.9  --  4.1  4.2 4.8  NEUTROABS 4.4  --  5.0  --  2.7  --   HGB 9.7*   < > 9.5* 8.5* 9.3*  9.2* 10.1*  HCT 32.0*   < > 26.9* 25.0* 26.6*  26.1* 28.8*  MCV 116.4*  --  102.3*  --  101.5*  100.0 102.9*  PLT 141*  --  95*  --  58*  59* 57*   < > = values in this interval not displayed.    Medications:     amLODipine  5 mg Oral Daily   Chlorhexidine Gluconate Cloth  6 each Topical Q0600   heparin  5,000 Units Subcutaneous Q8H   insulin aspart  0-9 Units Subcutaneous TID WC   tamsulosin  0.4 mg Oral Daily    Madelon Lips, MD 02/24/2022, 1:22 PM

## 2022-02-24 NOTE — Progress Notes (Signed)
[facility]                                                                              Jose [name], is a 71 y.o. male, DOB - 11/27/1950, KWI:[id number] Admit date - 02/20/2022    Outpatient Primary MD for the patient is [name], [name], MD  LOS - 4  days  Chief Complaint  Patient presents with   Chest Pain   Back Pain   Nausea   Emesis   Dizziness       Brief summary   Patient is a 71 year old male with a DM, HTN, CAD was in his usual state of health until 12/15 when he developed nausea and vomiting, persisted all the way through his presentation on 12/19.  Patient's family reported that he was not able to keep a drop of fluid or bite of food down since the symptoms started.  Patient presented on 12/19 with chest pain, abdominal pain, lightheadedness, diaphoresis.  CT abdomen pelvis showed no acute issues. BMET showed potassium 7.3, CO2 less than 7, BUN 73, creatinine 10.95, lactic acid > 9. Patient also became hypotensive despite 3 L IV fluid resuscitation and was started on norepinephrine infusion.  Patient was admitted to ICU by PCCM for shock, lactic acidosis and hyperkalemia, acute kidney injury He was initiated on CRRT on 12/19, stopped on 12/21 Patient was transferred to the floor on 12/21, [facility] assumed care on 12/22   Assessment & Plan    Principal Problem: Shock with lactic acidosis, metabolic acidosis  ([place]) -On presentation, lactic acid> 9, profound dehydration, unclear if septic as infectious workup has so far been unremarkable.  No fevers, no leukocytosis, chest x-ray unremarkable. -Patient was placed on vasopressors, on vasopressin and Levophed, off since 02/21/2022 -BP improved, no acute issues  Active Problems: Acute kidney injury with hyperkalemia, uremia -In the setting of profound dehydration, nausea vomiting, ACE inhibitor, metformin, hypovolemia on presentation.  Creatinine initially 10.95-> 11.7 on admission with potassium  7.0 -CT abdomen pelvis did not show any obstructive uropathy -Patient was placed on IV fluid hydration, nephrology consult was consulted, started on CRRT on 12/19, stopped on 02/2020, creatinine continues to improve, making urine -Creatinine improving, 2.46, removing temporary HD cath today -Will follow creatinine, if continues to improve, hopefully DC home in a.m.     Type 2 diabetes mellitus without complication, without long-term current use of insulin (HCC) -Hold metformin, -CBGs controlled, continue SSI while inpatient    Anemia, chronic, macrocytic -Baseline hemoglobin ~13 -Presented with hemoglobin of 10.7 -H&H improving -MCV 101.5, B12 323, folate 18.5  BPH -Continue Flomax   Hypertension -BP now stable, resumed on amlodipine  Obesity  Estimated body mass index is 30.1 kg/m as calculated from the following:   Height as of this encounter: '5\' 6"'$  (1.676 m).   Weight as of this encounter: 84.6 kg.  Code Status: Full code DVT Prophylaxis:  heparin injection 5,000 Units Start: 02/20/22 2200   Level of Care: Level of care: Telemetry Medical Family Communication: Updated patient' son at the bedside Disposition Plan:      Remains inpatient appropriate: Hopefully DC home tomorrow if  creatinine continues to improve.  Procedures:  CRRT  Consultants:   Admitted by PCCM, nephrology  Antimicrobials:   Anti-infectives (From admission, onward)    Start     Dose/Rate Route Frequency Ordered Stop   02/21/22 1500  ceFEPIme (MAXIPIME) 1 g in sodium chloride 0.9 % 100 mL IVPB  Status:  Discontinued        1 g 200 mL/hr over 30 Minutes Intravenous Every 24 hours 02/20/22 1412 02/20/22 1918   02/21/22 0600  ceFEPIme (MAXIPIME) 2 g in sodium chloride 0.9 % 100 mL IVPB  Status:  Discontinued        2 g 200 mL/hr over 30 Minutes Intravenous Every 12 hours 02/20/22 1918 02/21/22 1341   02/20/22 1415  doxycycline (VIBRAMYCIN) 100 mg in sodium chloride 0.9 % 250 mL IVPB  Status:   Discontinued        100 mg 125 mL/hr over 120 Minutes Intravenous Every 12 hours 02/20/22 1410 02/21/22 1341   02/20/22 1415  ceFEPIme (MAXIPIME) 2 g in sodium chloride 0.9 % 100 mL IVPB        2 g 200 mL/hr over 30 Minutes Intravenous  Once 02/20/22 1412 02/20/22 1503          Medications  amLODipine  5 mg Oral Daily   Chlorhexidine Gluconate Cloth  6 each Topical Q0600   heparin  5,000 Units Subcutaneous Q8H   insulin aspart  0-9 Units Subcutaneous TID WC   tamsulosin  0.4 mg Oral Daily      Subjective:   Jose [name] was seen and examined today.  No acute complaints, son at the bedside.  Seen earlier this morning, looking forward to removing temporary HD cath.  Making urine.  No chest pain, shortness of breath, fevers.    Objective:   Vitals:   02/23/22 2101 02/24/22 0500 02/24/22 0520 02/24/22 0811  BP: 131/76  132/76 (!) 140/75  Pulse: 80  73 82  Resp: '18  17 16  '$ Temp: 98.7 F (37.1 C)  98.6 F (37 C) 98.1 F (36.7 C)  TempSrc: Oral  Oral Oral  SpO2: 95%  95% 94%  Weight:  84.6 kg    Height:        Intake/Output Summary (Last 24 hours) at 02/24/2022 1304 Last data filed at 02/23/2022 2101 Gross per 24 hour  Intake --  Output 700 ml  Net -700 ml     Wt Readings from Last 3 Encounters:  02/24/22 84.6 kg  10/18/21 81.8 kg  09/05/21 77.1 kg   Physical Exam General: Alert and oriented x 3, NAD Cardiovascular: S1 S2 clear, RRR.  Respiratory: Diminished breath sound at the bases but fairly clear, no wheezing Gastrointestinal: Soft, nontender, nondistended, NBS Ext: trace pedal edema bilaterally Neuro: no new deficits Psych: Normal affect    Data Reviewed:  I have personally reviewed following labs    CBC Lab Results  Component Value Date   WBC 4.8 02/24/2022   RBC 2.80 (L) 02/24/2022   HGB 10.1 (L) 02/24/2022   HCT 28.8 (L) 02/24/2022   MCV 102.9 (H) 02/24/2022   MCH 36.1 (H) 02/24/2022   PLT 57 (L) 02/24/2022   MCHC 35.1 02/24/2022   RDW  13.0 02/24/2022   LYMPHSABS 0.8 02/22/2022   MONOABS 0.5 02/22/2022   EOSABS 0.0 02/22/2022   BASOSABS 0.0 43/15/4008     Last metabolic panel Lab Results  Component Value Date   NA 140 02/24/2022   K 3.4 (L) 02/24/2022  CL 99 02/24/2022   CO2 31 02/24/2022   BUN 30 (H) 02/24/2022   CREATININE 2.46 (H) 02/24/2022   GLUCOSE 112 (H) 02/24/2022   GFRNONAA 28 (L) 02/24/2022   GFRAA >60 06/21/2019   CALCIUM 7.6 (L) 02/24/2022   PHOS 2.6 02/24/2022   PROT 5.1 (L) 02/20/2022   ALBUMIN 2.2 (L) 02/24/2022   BILITOT 0.9 02/20/2022   ALKPHOS 45 02/20/2022   AST 45 (H) 02/20/2022   ALT 44 02/20/2022   ANIONGAP 10 02/24/2022    CBG (last 3)  Recent Labs    02/23/22 2100 02/24/22 0810 02/24/22 1135  GLUCAP 131* 112* 85      Coagulation Profile: No results for input(s): "INR", "PROTIME" in the last 168 hours.   Radiology Studies: I have personally reviewed the imaging studies  No results found.     Estill Cotta M.D. [facility] 02/24/2022, 1:04 PM  Available via Epic secure chat 7am-7pm After 7 pm, please refer to night coverage provider listed on amion.

## 2022-02-24 NOTE — Progress Notes (Signed)
Temporary HD cath removed per order with no complications.  Patient in supine position, pressure held to achieve hemostasis, vaseline/gauze/tegaderm applied.  Aftercare instructions provided to pt and family who verbalized understanding.

## 2022-02-25 DIAGNOSIS — E1151 Type 2 diabetes mellitus with diabetic peripheral angiopathy without gangrene: Secondary | ICD-10-CM

## 2022-02-25 DIAGNOSIS — E875 Hyperkalemia: Secondary | ICD-10-CM | POA: Diagnosis not present

## 2022-02-25 DIAGNOSIS — N179 Acute kidney failure, unspecified: Secondary | ICD-10-CM | POA: Diagnosis not present

## 2022-02-25 DIAGNOSIS — E872 Acidosis, unspecified: Secondary | ICD-10-CM | POA: Diagnosis not present

## 2022-02-25 LAB — RENAL FUNCTION PANEL
Albumin: 2.2 g/dL — ABNORMAL LOW (ref 3.5–5.0)
Anion gap: 13 (ref 5–15)
BUN: 25 mg/dL — ABNORMAL HIGH (ref 8–23)
CO2: 27 mmol/L (ref 22–32)
Calcium: 8.3 mg/dL — ABNORMAL LOW (ref 8.9–10.3)
Chloride: 100 mmol/L (ref 98–111)
Creatinine, Ser: 1.97 mg/dL — ABNORMAL HIGH (ref 0.61–1.24)
GFR, Estimated: 36 mL/min — ABNORMAL LOW (ref >60)
Glucose, Bld: 122 mg/dL — ABNORMAL HIGH (ref 70–99)
Phosphorus: 3.3 mg/dL (ref 2.5–4.6)
Potassium: 2.9 mmol/L — ABNORMAL LOW (ref 3.5–5.1)
Sodium: 140 mmol/L (ref 135–145)

## 2022-02-25 LAB — CULTURE, BLOOD (ROUTINE X 2)
Culture: NO GROWTH
Culture: NO GROWTH
Special Requests: ADEQUATE
Special Requests: ADEQUATE

## 2022-02-25 LAB — GLUCOSE, CAPILLARY
Glucose-Capillary: 118 mg/dL — ABNORMAL HIGH (ref 70–99)
Glucose-Capillary: 117 mg/dL — ABNORMAL HIGH (ref 70–99)

## 2022-02-25 LAB — MAGNESIUM: Magnesium: 1.2 mg/dL — ABNORMAL LOW (ref 1.7–2.4)

## 2022-02-25 LAB — POTASSIUM: Potassium: 3.4 mmol/L — ABNORMAL LOW (ref 3.5–5.1)

## 2022-02-25 MED ORDER — MAGNESIUM OXIDE -MG SUPPLEMENT 400 (240 MG) MG PO TABS: 400.0000 mg | ORAL_TABLET | Freq: Two times a day (BID) | ORAL | 0 refills | Status: AC

## 2022-02-25 MED ORDER — POTASSIUM CHLORIDE 10 MEQ/100ML IV SOLN
10.0000 meq | INTRAVENOUS | Status: AC
  Administered 2022-02-25 (×2): 10 meq via INTRAVENOUS
  Filled 2022-02-25 (×2): qty 100

## 2022-02-25 MED ORDER — POTASSIUM CHLORIDE CRYS ER 20 MEQ PO TBCR
40.0000 meq | EXTENDED_RELEASE_TABLET | Freq: Once | ORAL | Status: AC
  Administered 2022-02-25: 40 meq via ORAL
  Filled 2022-02-25: qty 2

## 2022-02-25 MED ORDER — POTASSIUM CHLORIDE CRYS ER 20 MEQ PO TBCR
40.0000 meq | EXTENDED_RELEASE_TABLET | ORAL | Status: AC
  Administered 2022-02-25 (×2): 40 meq via ORAL
  Filled 2022-02-25 (×2): qty 2

## 2022-02-25 MED ORDER — MAGNESIUM OXIDE -MG SUPPLEMENT 400 (240 MG) MG PO TABS
400.0000 mg | ORAL_TABLET | Freq: Two times a day (BID) | ORAL | Status: DC
  Administered 2022-02-25: 400 mg via ORAL
  Filled 2022-02-25: qty 1

## 2022-02-25 MED ORDER — POTASSIUM CHLORIDE CRYS ER 20 MEQ PO TBCR: 20.0000 meq | EXTENDED_RELEASE_TABLET | Freq: Every day | ORAL | 0 refills | Status: DC

## 2022-02-25 MED ORDER — GLIMEPIRIDE 2 MG PO TABS: 2.0000 mg | ORAL_TABLET | Freq: Every day | ORAL | 11 refills | Status: DC

## 2022-02-25 MED ORDER — ONDANSETRON HCL 8 MG PO TABS: 8.0000 mg | ORAL_TABLET | Freq: Three times a day (TID) | ORAL | 0 refills | Status: DC | PRN

## 2022-02-25 NOTE — Discharge Summary (Signed)
Physician Discharge Summary   Patient: Jose [name] MRN: [id number] DOB: 1951/02/21  Admit date:     02/20/2022  Discharge date: 02/25/22  Discharge Physician: Estill Cotta, MD    PCP: [name], [name], MD   Recommendations at discharge:   Continue KDUR 20 meq p.o. daily for 3 days Continue mag oxide 400 mg p.o. twice daily for 7 days Discontinued benazepril, metformin Started on Amaryl 2 mg daily  Discharge Diagnoses:    Shock ([place])   Lactic acidosis  Acute kidney injury with uremia   Type 2 diabetes mellitus without complication, without long-term current use of insulin (HCC)  Chronic macrocytic anemia  BPH  Hypertension Obesity    Hospital Course:  Patient is a 71 year old male with a DM, HTN, CAD was in his usual state of health until 12/15 when he developed nausea and vomiting, persisted all the way through his presentation on 12/19.  Patient's family reported that he was not able to keep a drop of fluid or bite of food down since the symptoms started.  Patient presented on 12/19 with chest pain, abdominal pain, lightheadedness, diaphoresis.  CT abdomen pelvis showed no acute issues. BMET showed potassium 7.3, CO2 less than 7, BUN 73, creatinine 10.95, lactic acid > 9. Patient also became hypotensive despite 3 L IV fluid resuscitation and was started on norepinephrine infusion.  Patient was admitted to ICU by PCCM for shock, lactic acidosis and hyperkalemia, acute kidney injury He was initiated on CRRT on 12/19, stopped on 12/21 Patient was transferred to the floor on 12/21, TRH assumed care on 12/22  Assessment and Plan:  Shock with lactic acidosis, metabolic acidosis  ([place]) -On presentation, lactic acid> 9, profound dehydration, unclear if septic as infectious workup has so far been unremarkable.  No fevers, no leukocytosis, chest x-ray unremarkable. -Patient was placed on vasopressors, on vasopressin and Levophed, off since 02/21/2022 -BP improved, no acute  issues   Active Problems: Acute kidney injury with hyperkalemia, uremia -In the setting of profound dehydration, nausea vomiting, ACE inhibitor, metformin, hypovolemia on presentation.  Cr initially 10.95-> 11.7 on admission with K 7.0 -CT abdomen pelvis did not show any obstructive uropathy -Patient was placed on IV fluid hydration, nephrology consult was consulted, started on CRRT on 12/19, stopped on 02/2020, creatinine continues to improve, making urine -HD cath removed on 12/23 -Creatinine continues to improve, 1.97 at discharge -Benazepril, metformin discontinued       Type 2 diabetes mellitus without complication, without long-term current use of insulin ([place]) -Discussed metformin -Patient was placed on sliding scale insulin while inpatient -Started on Amaryl 2 mg daily upon discharge.       Anemia, chronic, macrocytic -Baseline hemoglobin ~13 -Presented with hemoglobin of 10.7 -MCV 101.5, B12 323, folate 18.5 -Hemoglobin at baseline, 10.1 at discharge   BPH -Continue Flomax     Hypertension -BP now stable, resumed on amlodipine, bisoprolol -Benazepril discontinued due to AKI  Hypokalemia, hypomagnesemia -K2.9 on 12/24, was replaced, potassium 3.4 at discharge, magnesium 1.2 - patient did not want to stay inpatient for IV magnesium, placed on oral magnesium 400 mg p.o. twice daily for 7 days -K-Dur 20 mEq p.o. daily for 3 days   Obesity   Estimated body mass index is 30.1 kg/m as calculated from the following:   Height as of this encounter: '5\' 6"'$  (1.676 m).   Weight as of this encounter: 84.6 kg.      Pain control - [employer] Controlled Substance Reporting System database  was reviewed. and patient was instructed, not to drive, operate heavy machinery, perform activities at heights, swimming or participation in water activities or provide baby-sitting services while on Pain, Sleep and Anxiety Medications; until their outpatient Physician has advised to do so  again. Also recommended to not to take more than prescribed Pain, Sleep and Anxiety Medications.  Consultants: Admitted by CCM, nephrology Procedures performed: Dialysis Disposition: Home Diet recommendation:  Discharge Diet Orders (From admission, onward)     Start     Ordered   02/25/22 0000  Diet Carb Modified        02/25/22 1221           Carb modified diet DISCHARGE MEDICATION: Allergies as of 02/25/2022   No Known Allergies      Medication List     STOP taking these medications    benazepril 40 MG tablet Commonly known as: LOTENSIN   metFORMIN 1000 MG tablet Commonly known as: GLUCOPHAGE       TAKE these medications    acetaminophen 500 MG tablet Commonly known as: TYLENOL Take 500-1,000 mg by mouth as needed for moderate pain.   ALPRAZolam 0.5 MG tablet Commonly known as: XANAX TAKE 1 TABLET BY MOUTH THREE TIMES A DAY AS NEEDED What changed:  when to take this reasons to take this   amLODipine 5 MG tablet Commonly known as: NORVASC TAKE 1 TABLET (5 MG TOTAL) BY MOUTH DAILY.   ascorbic acid 500 MG tablet Commonly known as: VITAMIN C Take 1,000 mg by mouth daily. Reported on 08/23/2015   aspirin EC 81 MG tablet Take 81 mg by mouth at bedtime.   atorvastatin 20 MG tablet Commonly known as: LIPITOR TAKE 1 TABLET BY MOUTH EVERY DAY What changed: when to take this   Azelaic Acid 15 % gel APPLY 1 APPLICATION TOPICALLY AS DIRECTED. AFTER SKIN IS THOROUGHLY WASHED AND PATTED DRY, GENTLY BUT THOROUGHLY MASSAGE A THIN FILM OF AZELAIC ACID INTO THE AFFECTED AREA TWICE DAILY, IN THE MORNING AND EVENING. APPLIED TO FACE.   bisoprolol 10 MG tablet Commonly known as: ZEBETA Take 1 tablet (10 mg total) by mouth daily.   cholecalciferol 25 MCG (1000 UNIT) tablet Commonly known as: VITAMIN D3 Take 1,000 Units by mouth daily.   EPINEPHrine 0.3 mg/0.3 mL Soaj injection Commonly known as: EPI-PEN Inject 0.3 mg into the muscle as needed for  anaphylaxis.   glimepiride 2 MG tablet Commonly known as: Amaryl Take 1 tablet (2 mg total) by mouth daily with breakfast.   halobetasol 0.05 % cream Commonly known as: ULTRAVATE Apply 1 application. topically 2 (two) times daily as needed (dry skin).   magnesium oxide 400 (240 Mg) MG tablet Commonly known as: MAG-OX Take 1 tablet (400 mg total) by mouth 2 (two) times daily for 7 days.   methocarbamol 500 MG tablet Commonly known as: ROBAXIN Take 500 mg by mouth as needed for muscle spasms.   multivitamin with minerals tablet Take 1 tablet by mouth at bedtime.   nitroGLYCERIN 0.4 MG SL tablet Commonly known as: NITROSTAT DISSOLVE 1 TABLET BY MOUTH UNDER THE TONGUE EVERY 5 MINUTES AS NEEDED FOR CHEST PAIN What changed:  how much to take how to take this when to take this reasons to take this additional instructions   ondansetron 8 MG tablet Commonly known as: ZOFRAN Take 1 tablet (8 mg total) by mouth every 8 (eight) hours as needed for nausea or vomiting. What changed: when to take this   OneTouch Verio  test strip Generic drug: glucose blood USE TO TEST BLOOD SUGAR ONCE A DAY DX E11.51 -   oxybutynin 5 MG tablet Commonly known as: DITROPAN Take 5 mg by mouth at bedtime.   potassium chloride SA 20 MEQ tablet Commonly known as: KLOR-CON M Take 1 tablet (20 mEq total) by mouth daily for 3 doses.   sildenafil 100 MG tablet Commonly known as: VIAGRA Take 100 mg by mouth daily as needed for erectile dysfunction.   tamsulosin 0.4 MG Caps capsule Commonly known as: FLOMAX Take 0.4 mg by mouth in the morning and at bedtime.   TUMS PO Take 2-3 tablets by mouth as needed (heartburn).        Follow-up Information     [name], [name], MD. Schedule an appointment as soon as possible for a visit in 2 week(s).   Specialty: Internal Medicine Why: for hospital follow-up, obtain labs ( renal function panel) Contact information: [place] [address] [phone]         Madelon Lips, MD. Schedule an appointment as soon as possible for a visit in 2 week(s).   Specialty: Nephrology Why: for hospital follow-up Contact information: [place] [address] [phone]                Discharge Exam: Danley Danker Weights   02/23/22 0500 02/24/22 0500 02/25/22 0500  Weight: 85.1 kg 84.6 kg 83.3 kg   S: Looking forward to go home, wife and son at the bedside.  HD cath removed  BP (!) 141/83 (BP Location: Right Arm)   Pulse (!) 108   Temp 98.5 F (36.9 C) (Oral)   Resp 18   Ht '5\' 6"'$  (1.676 m)   Wt 83.3 kg   SpO2 93%   BMI 29.64 kg/m    Physical Exam General: Alert and oriented x 3, NAD Cardiovascular: S1 S2 clear, RRR.  Respiratory: CTAB, no wheezing, rales or rhonchi Gastrointestinal: Soft, nontender, nondistended, NBS Ext: no pedal edema bilaterally Neuro: no new deficits Psych: Normal affect   Condition at discharge: fair  The results of significant diagnostics from this hospitalization (including imaging, microbiology, ancillary and laboratory) are listed below for reference.   Imaging Studies: ECHOCARDIOGRAM COMPLETE  Result Date: 02/21/2022    ECHOCARDIOGRAM REPORT   Patient Name:   Jose [name] Date of Exam: 02/21/2022 Medical Rec #:  [id number]    Height:       66.0 in Accession #:    [id number]   Weight:       185.8 lb Date of Birth:  Dec 21, 1950    BSA:          1.938 m Patient Age:    81 years     BP:           108/50 mmHg Patient Gender: M            HR:           81 bpm. Exam Location:  Inpatient Procedure: 2D Echo, Cardiac Doppler and Color Doppler Indications:    Shock  History:        Patient has no prior history of Echocardiogram examinations. CAD                 and Previous Myocardial Infarction, TIA; Risk Factors:Diabetes,                 ETOH, Hypertension and Dyslipidemia.  Sonographer:    Eartha Inch Referring Phys: [phone]  LAURA P CLARK  Sonographer  Comments: Technically difficult study due to poor echo windows. Image acquisition challenging due to patient body habitus and Image acquisition challenging due to respiratory motion. IMPRESSIONS  1. Left ventricular ejection fraction, by estimation, is 70 to 75%. Left ventricular ejection fraction by 2D MOD biplane is 70.2 %. The left ventricle has hyperdynamic function. The left ventricle has no regional wall motion abnormalities. There is mild  left ventricular hypertrophy. Left ventricular diastolic parameters are consistent with Grade I diastolic dysfunction (impaired relaxation).  2. Right ventricular systolic function is mildly reduced. The right ventricular size is normal.  3. The mitral valve is abnormal. Trivial mitral valve regurgitation.  4. The aortic valve is tricuspid. Aortic valve regurgitation is not visualized. Aortic valve sclerosis is present, with no evidence of aortic valve stenosis. Aortic valve mean gradient measures 7.0 mmHg.  5. The inferior vena cava is normal in size with greater than 50% respiratory variability, suggesting right atrial pressure of 3 mmHg.  6. Increased flow velocities may be secondary to anemia, thyrotoxicosis, hyperdynamic or high flow state. Comparison(s): No prior Echocardiogram. FINDINGS  Left Ventricle: Left ventricular ejection fraction, by estimation, is 70 to 75%. Left ventricular ejection fraction by 2D MOD biplane is 70.2 %. The left ventricle has hyperdynamic function. The left ventricle has no regional wall motion abnormalities. The left ventricular internal cavity size was normal in size. There is mild left ventricular hypertrophy. Left ventricular diastolic parameters are consistent with Grade I diastolic dysfunction (impaired relaxation). Indeterminate filling pressures. Right Ventricle: The right ventricular size is normal. No increase in right ventricular wall thickness. Right ventricular systolic function is mildly reduced. Left Atrium: Left atrial size  was normal in size. Right Atrium: Right atrial size was normal in size. Pericardium: There is no evidence of pericardial effusion. Mitral Valve: The mitral valve is abnormal. Mild mitral annular calcification. Trivial mitral valve regurgitation. Tricuspid Valve: The tricuspid valve is grossly normal. Tricuspid valve regurgitation is not demonstrated. Aortic Valve: The aortic valve is tricuspid. Aortic valve regurgitation is not visualized. Aortic valve sclerosis is present, with no evidence of aortic valve stenosis. Aortic valve mean gradient measures 7.0 mmHg. Aortic valve peak gradient measures 11.7 mmHg. Aortic valve area, by VTI measures 3.29 cm. Pulmonic Valve: The pulmonic valve was grossly normal. Pulmonic valve regurgitation is not visualized. Aorta: The aortic root and ascending aorta are structurally normal, with no evidence of dilitation. Venous: The inferior vena cava is normal in size with greater than 50% respiratory variability, suggesting right atrial pressure of 3 mmHg. IAS/Shunts: No atrial level shunt detected by color flow Doppler.  LEFT VENTRICLE PLAX 2D                        Biplane EF (MOD) LVIDd:         4.60 cm         LV Biplane EF:   Left LVIDs:         2.60 cm                          ventricular LV PW:         1.10 cm                          ejection LV IVS:        0.90 cm  fraction by LVOT diam:     2.20 cm                          2D MOD LV SV:         112                              biplane is LV SV Index:   58                               70.2 %. LVOT Area:     3.80 cm                                Diastology                                LV e' medial:    7.07 cm/s LV Volumes (MOD)               LV E/e' medial:  13.2 LV vol d, MOD    116.0 ml      LV e' lateral:   9.68 cm/s A2C:                           LV E/e' lateral: 9.6 LV vol d, MOD    130.0 ml A4C: LV vol s, MOD    35.7 ml A2C: LV vol s, MOD    38.1 ml A4C: LV SV MOD A2C:   80.3 ml LV SV MOD  A4C:   130.0 ml LV SV MOD BP:    88.8 ml RIGHT VENTRICLE            IVC RV S prime:     9.03 cm/s  IVC diam: 2.30 cm TAPSE (M-mode): 2.4 cm LEFT ATRIUM             Index        RIGHT ATRIUM           Index LA diam:        4.50 cm 2.32 cm/m   RA Area:     15.80 cm LA Vol (A2C):   48.0 ml 24.76 ml/m  RA Volume:   35.00 ml  18.06 ml/m LA Vol (A4C):   52.5 ml 27.08 ml/m LA Biplane Vol: 51.2 ml 26.41 ml/m  AORTIC VALVE AV Area (Vmax):    3.18 cm AV Area (Vmean):   3.11 cm AV Area (VTI):     3.29 cm AV Vmax:           171.00 cm/s AV Vmean:          126.000 cm/s AV VTI:            0.340 m AV Peak Grad:      11.7 mmHg AV Mean Grad:      7.0 mmHg LVOT Vmax:         143.00 cm/s LVOT Vmean:        103.000 cm/s LVOT VTI:          0.294 m LVOT/AV VTI ratio: 0.86  AORTA Ao Root diam: 3.20 cm Ao Asc diam:  3.30 cm MITRAL VALVE MV Area (PHT): 4.10 cm  SHUNTS MV Decel Time: 185 msec    Systemic VTI:  0.29 m MV E velocity: 93.30 cm/s  Systemic Diam: 2.20 cm MV A velocity: 56.10 cm/s MV E/A ratio:  1.66 Lyman Bishop MD Electronically signed by Lyman Bishop MD Signature Date/Time: 02/21/2022/10:55:57 AM    Final    DG Abd Portable 1V  Result Date: 02/21/2022 CLINICAL DATA:  Abdominal pain EXAM: PORTABLE ABDOMEN - 1 VIEW COMPARISON:  CTA abdomen/pelvis dated 02/20/2022 FINDINGS: Contrast in the gastric cardia. Nonobstructive bowel gas pattern. Visualized osseous structures are within normal limits. IMPRESSION: Negative. Electronically Signed   By: Julian Hy M.D.   On: 02/21/2022 02:29   DG Chest 1 View  Result Date: 02/20/2022 CLINICAL DATA:  Line placement. EXAM: CHEST  1 VIEW COMPARISON:  Same day. FINDINGS: Interval placement of right internal jugular catheter with distal tip in expected position of the SVC. No pneumothorax is noted. IMPRESSION: Interval placement of right internal jugular catheter with distal tip in expected position of the SVC. Electronically Signed   By: Marijo Conception M.D.   On:  02/20/2022 17:37   CT Angio Chest/Abd/Pel for Dissection W and/or Wo Contrast  Result Date: 02/20/2022 CLINICAL DATA:  Chest pain and back pain. Possible acute aortic syndrome. EXAM: CT ANGIOGRAPHY CHEST, ABDOMEN AND PELVIS TECHNIQUE: Non-contrast CT of the chest was initially obtained. Multidetector CT imaging through the chest, abdomen and pelvis was performed using the standard protocol during bolus administration of intravenous contrast. Multiplanar reconstructed images and MIPs were obtained and reviewed to evaluate the vascular anatomy. RADIATION DOSE REDUCTION: This exam was performed according to the departmental dose-optimization program which includes automated exposure control, adjustment of the mA and/or kV according to patient size and/or use of iterative reconstruction technique. CONTRAST:  156m OMNIPAQUE IOHEXOL 350 MG/ML SOLN COMPARISON:  Abdominal/pelvic CT scan 06/04/2021 FINDINGS: CTA CHEST FINDINGS Examination is limited by breathing motion artifact in the chest, abdomen and pelvis. Cardiovascular: The heart is normal in size. No pericardial effusion. The aorta is normal in caliber. No dissection. Scattered atherosclerotic calcifications. The aortic branch vessels are patent. There are age advanced three-vessel coronary artery calcifications and calcifications around the aortic valve. The pulmonary arteries are unremarkable. No findings for pulmonary embolism. Mediastinum/Nodes: No mediastinal or hilar mass or lymphadenopathy. The esophagus is grossly normal. Lungs/Pleura: No acute pulmonary process. No pulmonary edema, pulmonary infiltrates or pleural effusions. Significant breathing motion artifact noted at the lung bases in particular. Musculoskeletal: No chest wall mass, supraclavicular or axillary adenopathy. The bony thorax is intact. Advanced degenerative changes at the right sternoclavicular joint. Review of the MIP images confirms the above findings. CTA ABDOMEN AND PELVIS FINDINGS  VASCULAR Aorta: Normal caliber. No dissection. Moderate scattered atherosclerotic calcifications. Celiac: Ostial atherosclerotic calcifications with mild stenosis. SMA: Advanced atherosclerotic calcification involving the proximal artery with probable greater than 50% stenosis at the origin. Renals: 2 left renal arteries and 3 right renal arteries with atherosclerotic calcifications but no occlusion or significant stenosis. IMA: Patent Inflow: Moderate atherosclerotic calcifications but no aneurysm, dissection or significant stenosis. Veins: The major venous structures are grossly patent. Review of the MIP images confirms the above findings. NON-VASCULAR Hepatobiliary: Diffuse and marked fatty infiltration of the liver. No hepatic lesions or intrahepatic biliary dilatation. The gallbladder is grossly normal. No common bile duct dilatation. Pancreas: No mass, inflammation or ductal dilatation. Spleen: Normal size.  No focal lesions. Adrenals/Urinary Tract: The adrenal glands and kidneys are unremarkable. The bladder is not well distended. No gross abnormality.  Stomach/Bowel: The stomach, duodenum, small bowel and colon are grossly normal. Descending and sigmoid colon diverticulosis but no findings for acute diverticulitis. Lymphatic: No abdominal or pelvic lymphadenopathy. Reproductive: The prostate gland and seminal vesicles are grossly normal. Other: Evidence of right inguinal hernia repair. No recurrent hernia. Musculoskeletal: No significant bony findings. 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 Review of the MIP images confirms the above findings. IMPRESSION: 1. No aortic aneurysm or dissection. 2. Age advanced three-vessel coronary artery calcifications and calcifications around the aortic valve. 3. No acute pulmonary findings. 4. Age advanced atherosclerotic calcifications involving aorta and branch vessels but no aneurysm, dissection or severe stenosis. 5. Diffuse and marked fatty infiltration of the liver. 6. No  acute abdominal/pelvic findings, mass lesions or adenopathy. Aortic Atherosclerosis (ICD10-I70.0). Electronically Signed   By: Marijo Sanes M.D.   On: 02/20/2022 12:53   DG Chest 1 View  Result Date: 02/20/2022 CLINICAL DATA:  [id number] Pain [id number] EXAM: CHEST  1 VIEW COMPARISON:  09/05/2021. FINDINGS: Cardiac silhouette is unremarkable. No pneumothorax or pleural effusion. The lungs are clear. There are thoracic degenerative changes. IMPRESSION: No acute cardiopulmonary process. Electronically Signed   By: Sammie Bench M.D.   On: 02/20/2022 11:25    Microbiology: Results for orders placed or performed during the hospital encounter of 02/20/22  Resp panel by RT-PCR (RSV, Flu A&B, Covid) Anterior Nasal Swab     Status: None   Collection Time: 02/20/22  1:00 PM   Specimen: Anterior Nasal Swab  Result Value Ref Range Status   SARS Coronavirus 2 by RT PCR NEGATIVE NEGATIVE Final    Comment: (NOTE) SARS-CoV-2 target nucleic acids are NOT DETECTED.  The SARS-CoV-2 RNA is generally detectable in upper respiratory specimens during the acute phase of infection. The lowest concentration of SARS-CoV-2 viral copies this assay can detect is 138 copies/mL. A negative result does not preclude SARS-Cov-2 infection and should not be used as the sole basis for treatment or other patient management decisions. A negative result may occur with  improper specimen collection/handling, submission of specimen other than nasopharyngeal swab, presence of viral mutation(s) within the areas targeted by this assay, and inadequate number of viral copies(<138 copies/mL). A negative result must be combined with clinical observations, patient history, and epidemiological information. The expected result is Negative.  Fact Sheet for Patients:  EntrepreneurPulse.com.au  Fact Sheet for Healthcare Providers:  IncredibleEmployment.be  This test is no t yet approved or cleared by the  [place] FDA and  has been authorized for detection and/or diagnosis of SARS-CoV-2 by FDA under an Emergency Use Authorization (EUA). This EUA will remain  in effect (meaning this test can be used) for the duration of the COVID-19 declaration under Section 564(b)(1) of the Act, 21 U.S.C.section 360bbb-3(b)(1), unless the authorization is terminated  or revoked sooner.       Influenza A by PCR NEGATIVE NEGATIVE Final   Influenza B by PCR NEGATIVE NEGATIVE Final    Comment: (NOTE) The Xpert Xpress SARS-CoV-2/FLU/RSV plus assay is intended as an aid in the diagnosis of influenza from Nasopharyngeal swab specimens and should not be used as a sole basis for treatment. Nasal washings and aspirates are unacceptable for Xpert Xpress SARS-CoV-2/FLU/RSV testing.  Fact Sheet for Patients: EntrepreneurPulse.com.au  Fact Sheet for Healthcare Providers: IncredibleEmployment.be  This test is not yet approved or cleared by the [place] FDA and has been authorized for detection and/or diagnosis of SARS-CoV-2 by FDA under an Emergency Use Authorization (EUA). This EUA will remain  in effect (meaning this test can be used) for the duration of the COVID-19 declaration under Section 564(b)(1) of the Act, 21 U.S.C. section 360bbb-3(b)(1), unless the authorization is terminated or revoked.     Resp Syncytial Virus by PCR NEGATIVE NEGATIVE Final    Comment: (NOTE) Fact Sheet for Patients: EntrepreneurPulse.com.au  Fact Sheet for Healthcare Providers: IncredibleEmployment.be  This test is not yet approved or cleared by the [place] FDA and has been authorized for detection and/or diagnosis of SARS-CoV-2 by FDA under an Emergency Use Authorization (EUA). This EUA will remain in effect (meaning this test can be used) for the duration of the COVID-19 declaration under Section 564(b)(1) of the Act, 21 U.S.C. section  360bbb-3(b)(1), unless the authorization is terminated or revoked.  Performed at [facility], [place] [address], [place], [place] [address]   Culture, blood (Routine X 2) w Reflex to ID Panel     Status: None   Collection Time: 02/20/22  4:11 PM   Specimen: BLOOD  Result Value Ref Range Status   Specimen Description BLOOD CENTRAL LINE  Final   Special Requests   Final    BOTTLES DRAWN AEROBIC AND ANAEROBIC Blood Culture adequate volume   Culture   Final    NO GROWTH 5 DAYS Performed at [facility], [place] [address], [place], [place] [address]    Report Status 02/25/2022 FINAL  Final  MRSA Next Gen by PCR, Nasal     Status: None   Collection Time: 02/20/22  4:34 PM   Specimen: Nasal Mucosa; Nasal Swab  Result Value Ref Range Status   MRSA by PCR Next Gen NOT DETECTED NOT DETECTED Final    Comment: (NOTE) The GeneXpert MRSA Assay (FDA approved for NASAL specimens only), is one component of a comprehensive MRSA colonization surveillance program. It is not intended to diagnose MRSA infection nor to guide or monitor treatment for MRSA infections. Test performance is not FDA approved in patients less than 56 years old. Performed at [facility], [place] [address], [place], [place] [address]   Culture, blood (Routine X 2) w Reflex to ID Panel     Status: None   Collection Time: 02/20/22  4:46 PM   Specimen: BLOOD  Result Value Ref Range Status   Specimen Description BLOOD CENTRAL LINE  Final   Special Requests   Final    BOTTLES DRAWN AEROBIC AND ANAEROBIC Blood Culture adequate volume   Culture   Final    NO GROWTH 5 DAYS Performed at [facility], [place] [address] [place], [place] [address]    Report Status 02/25/2022 FINAL  Final    Labs: CBC: Recent Labs  Lab 02/20/22 1043 02/20/22 1307 02/20/22 1603 02/20/22 2112 02/20/22 2330 02/21/22 0326 02/21/22 0535 02/22/22 0314 02/24/22 0202  WBC 8.1  --  7.5  --   --  6.9  --  4.1  4.2 4.8  NEUTROABS   --   --  4.4  --   --  5.0  --  2.7  --   HGB 10.7*   < > 9.7*   < > 10.2* 9.5* 8.5* 9.3*  9.2* 10.1*  HCT 36.5*   < > 32.0*   < > 30.0* 26.9* 25.0* 26.6*  26.1* 28.8*  MCV 121.7*  --  116.4*  --   --  102.3*  --  101.5*  100.0 102.9*  PLT 139*  --  141*  --   --  95*  --  58*  59* 57*   < > = values in this interval not displayed.   Basic Metabolic Panel: Recent Labs  Lab 02/21/22 0326 02/21/22 0535 02/21/22 1558 02/22/22 0314 02/23/22 0656 02/24/22 0202 02/25/22 0222 02/25/22 1113  NA 142   < > 137 136 139 140 140  --   K 4.0   < > 3.9 3.3* 3.1* 3.4* 2.9* 3.4*  CL 96*   < > 96* 95* 97* 99 100  --   CO2 13*   < > '24 29 29 31 27  '$ --   GLUCOSE 73   < > 152* 121* 105* 112* 122*  --   BUN 51*   < > 36* 25* 29* 30* 25*  --   CREATININE 6.74*   < > 4.28* 2.84* 2.74* 2.46* 1.97*  --   CALCIUM 7.4*   < > 6.9* 6.9* 7.2* 7.6* 8.3*  --   MG 1.7  --   --  2.0  --   --   --  1.2*  PHOS 6.1*  6.1*  --  3.9 2.2* 2.2* 2.6 3.3  --    < > = values in this interval not displayed.   Liver Function Tests: Recent Labs  Lab 02/20/22 1043 02/21/22 0326 02/21/22 1558 02/22/22 0314 02/23/22 0656 02/24/22 0202 02/25/22 0222  AST 45*  --   --   --   --   --   --   ALT 44  --   --   --   --   --   --   ALKPHOS 45  --   --   --   --   --   --   BILITOT 0.9  --   --   --   --   --   --   PROT 5.1*  --   --   --   --   --   --   ALBUMIN 2.9*   < > 2.4* 2.4* 2.4* 2.2* 2.2*   < > = values in this interval not displayed.   CBG: Recent Labs  Lab 02/24/22 1135 02/24/22 1617 02/24/22 1952 02/25/22 0733 02/25/22 1217  GLUCAP 85 116* 143* 118* 117*    Discharge time spent: greater than 30 minutes.  Signed: Estill Cotta, MD [facility] 02/25/2022

## 2022-02-25 NOTE — Progress Notes (Signed)
Pt discharged home this pm 

## 2022-02-27 ENCOUNTER — Telehealth: Payer: Self-pay | Admitting: *Deleted

## 2022-02-27 LAB — HEMOGLOBIN A1C
Hgb A1c MFr Bld: 5.3 % (ref 4.8–5.6)
Mean Plasma Glucose: 105 mg/dL

## 2022-02-27 NOTE — Telephone Encounter (Signed)
Transition Care Management Follow-up Telephone Call Date of discharge and from where: 02/25/2022 [facility] How have you been since you were released from the hospital? Feels very weak Any questions or concerns? No  Items Reviewed: Did the pt receive and understand the discharge instructions provided? Yes  Medications obtained and verified? Yes  Other? No  Any new allergies since your discharge? No  Dietary orders reviewed? Yes Do you have support at home? Yes   Home Care and Equipment/Supplies: Were home health services ordered? no If so, what is the name of the agency? NA  Has the agency set up a time to come to the patient's home? not applicable Were any new equipment or medical supplies ordered?  No What is the name of the medical supply agency? NA Were you able to get the supplies/equipment? not applicable Do you have any questions related to the use of the equipment or supplies? No  Functional Questionnaire: (I = Independent and D = Dependent) ADLs: i  Bathing/Dressing- i  Meal Prep- i  Eating- i  Maintaining continence- i  Transferring/Ambulation- i  Managing Meds- i  Follow up appointments reviewed:  PCP Hospital f/u appt confirmed? Yes  Scheduled to see Jerilee Hoh on 03/08/2021 @ 9:30. [facility] f/u appt confirmed? No   Are transportation arrangements needed? No If their condition worsens, is the pt aware to call PCP or go to the Emergency Dept.? Yes Was the patient provided with contact information for the PCP's office or ED? Yes Was to pt encouraged to call back with questions or concerns? Yes

## 2022-03-04 ENCOUNTER — Other Ambulatory Visit: Payer: Self-pay | Admitting: Internal Medicine

## 2022-03-06 ENCOUNTER — Encounter: Payer: Self-pay | Admitting: Internal Medicine

## 2022-03-08 ENCOUNTER — Other Ambulatory Visit: Payer: Self-pay | Admitting: Internal Medicine

## 2022-03-08 ENCOUNTER — Encounter: Payer: Self-pay | Admitting: Internal Medicine

## 2022-03-08 ENCOUNTER — Ambulatory Visit (INDEPENDENT_AMBULATORY_CARE_PROVIDER_SITE_OTHER): Payer: Medicare Other | Admitting: Internal Medicine

## 2022-03-08 VITALS — BP 110/54 | HR 82 | Temp 98.0°F | Wt 187.7 lb

## 2022-03-08 DIAGNOSIS — E876 Hypokalemia: Secondary | ICD-10-CM

## 2022-03-08 DIAGNOSIS — E872 Acidosis, unspecified: Secondary | ICD-10-CM

## 2022-03-08 DIAGNOSIS — I959 Hypotension, unspecified: Secondary | ICD-10-CM | POA: Diagnosis not present

## 2022-03-08 DIAGNOSIS — C61 Malignant neoplasm of prostate: Secondary | ICD-10-CM | POA: Diagnosis not present

## 2022-03-08 DIAGNOSIS — R579 Shock, unspecified: Secondary | ICD-10-CM

## 2022-03-08 DIAGNOSIS — Z09 Encounter for follow-up examination after completed treatment for conditions other than malignant neoplasm: Secondary | ICD-10-CM

## 2022-03-08 DIAGNOSIS — N170 Acute kidney failure with tubular necrosis: Secondary | ICD-10-CM | POA: Diagnosis not present

## 2022-03-08 DIAGNOSIS — I10 Essential (primary) hypertension: Secondary | ICD-10-CM

## 2022-03-08 DIAGNOSIS — R339 Retention of urine, unspecified: Secondary | ICD-10-CM

## 2022-03-08 DIAGNOSIS — E785 Hyperlipidemia, unspecified: Secondary | ICD-10-CM

## 2022-03-08 DIAGNOSIS — E1151 Type 2 diabetes mellitus with diabetic peripheral angiopathy without gangrene: Secondary | ICD-10-CM

## 2022-03-08 DIAGNOSIS — E119 Type 2 diabetes mellitus without complications: Secondary | ICD-10-CM

## 2022-03-08 DIAGNOSIS — D539 Nutritional anemia, unspecified: Secondary | ICD-10-CM | POA: Insufficient documentation

## 2022-03-08 LAB — CBC WITH DIFFERENTIAL/PLATELET
Basophils Absolute: 0.1 10*3/uL (ref 0.0–0.1)
Basophils Relative: 1.2 % (ref 0.0–3.0)
Eosinophils Absolute: 0.2 10*3/uL (ref 0.0–0.7)
Eosinophils Relative: 2.7 % (ref 0.0–5.0)
HCT: 26 % — ABNORMAL LOW (ref 39.0–52.0)
Hemoglobin: 8.9 g/dL — ABNORMAL LOW (ref 13.0–17.0)
Lymphocytes Relative: 23 % (ref 12.0–46.0)
Lymphs Abs: 1.3 10*3/uL (ref 0.7–4.0)
MCHC: 34.2 g/dL (ref 30.0–36.0)
MCV: 104.5 fl — ABNORMAL HIGH (ref 78.0–100.0)
Monocytes Absolute: 0.5 10*3/uL (ref 0.1–1.0)
Monocytes Relative: 7.7 % (ref 3.0–12.0)
Neutro Abs: 3.8 10*3/uL (ref 1.4–7.7)
Neutrophils Relative %: 65.4 % (ref 43.0–77.0)
Platelets: 263 10*3/uL (ref 150.0–400.0)
RBC: 2.48 Mil/uL — ABNORMAL LOW (ref 4.22–5.81)
RDW: 13.7 % (ref 11.5–15.5)
WBC: 5.8 10*3/uL (ref 4.0–10.5)

## 2022-03-08 LAB — MAGNESIUM: Magnesium: 1.4 mg/dL — ABNORMAL LOW (ref 1.5–2.5)

## 2022-03-08 MED ORDER — MAGNESIUM OXIDE 400 MG PO CAPS: 400.0000 mg | ORAL_CAPSULE | Freq: Three times a day (TID) | ORAL | 0 refills | Status: AC

## 2022-03-08 NOTE — Progress Notes (Signed)
Hospital follow-up visit     CC/Reason for Visit: Hospitalization follow-up  HPI: Jose CHAVARIN is a 72 y.o. male who is coming in today for the above mentioned reasons, specifically transitional care services face-to-face visit.    Dates hospitalized: 02/20/2022-02/25/2022 Days since discharge from hospital: 11 Patient was discharged from the hospital to: Home Reason for admission to hospital: Hypovolemic shock, acute renal failure Date of interactive phone contact with patient and/or caregiver: 02/27/2022  I have reviewed in detail patient's discharge summary plus pertinent specific notes, labs, and images from the hospitalization.  Yes  Patient presented to the hospital on 03/02/2022 with severe malaise and chest pain.  He was found to be hypotensive and in shock with a creatinine of 11.7.  He was admitted to the ICU and placed on vasopressors.  He was seen by nephrology and received CRRT.  He was taken off metformin and benazepril.  He continues amlodipine and bisoprolol for blood pressure.  He was placed on Amaryl for his diabetes.  On discharge he was hypokalemic and hypomagnesemic and we are asked to follow-up on these levels today.  Creatinine on discharge was 1.97.  Unfortunately he developed acute urinary retention following his hospital discharge and had to follow-up with urology.  Catheter was placed last week and was just taken out this morning.  It has been a few hours but still no spontaneous urination.  He has a history of prostate cancer.  He is on tamsulosin.  He  has orders to follow-up with urology by 2 PM if unable to urinate.  He brings in ambulatory blood pressure logs.  He remains hypotensive at home, most recent blood pressures are 98/53 and 82/57.  In office blood pressure is 110/54.  Medication reconciliation was done today and patient is taking meds as recommended by discharging hospitalist/specialist.  Yes, further changes made today as described  below.   Past Medical/Surgical History: Past Medical History:  Diagnosis Date   ALCOHOL ABUSE 07/27/2008   Allergy    ANXIETY 07/27/2008   Arthritis    Bifascicular block    beta blocker stopped due to profound bradycardia   CAD 07/27/2008   a. s/p Endeavor DES to CFX 07/2008; residual OM1 70%, EF 45%;  b. relook cath 5/10: patent stent in CFX;  c.  LHC (8/15):  prox LAD 30%, OM1 50% at bifurcation, OM1 sub-branch 50%, mid AVCFX stent patent, EF 55-65% - no change from 2010 >>> Med Rx (after abnormal nuc)    Cataract    Diabetes mellitus without complication ([place])    GERD 09/18/2006   History of TIA (transient ischemic attack) 07/2008   Hx of adenomatous colonic polyps    Hx of cardiovascular stress test    ETT-Myoview (09/2013):  Partially fixed defect with some reversibility - cannot r/o inf ischemia vs diaph atten, EF 51%; mod risk   Hyperlipidemia    HYPERTENSION 09/18/2006   LOW BACK PAIN 09/18/2006   Myocardial infarction ([place]) 2010   Numbness and tingling    finger tips at times   Rosacea 07/27/2008   SEIZURE DISORDER 09/18/2006   none since 2008   Squamous cell carcinoma of skin 07/19/2021   in situ - right nasal sidewall   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   Urinary tract infection start cipro 01-07-2012    Past Surgical History:  Procedure Laterality Date   CORONARY STENT PLACEMENT     drug eluting   GOLD SEED IMPLANT N/A 04/11/2021   Procedure: GOLD  SEED IMPLANT SPACE OAR;  Surgeon: Robley Fries, MD;  [facility];  Service: Urology;  Laterality: N/A;   HERNIA REPAIR     INGUINAL HERNIA REPAIR  01/14/2012   Procedure: LAPAROSCOPIC INGUINAL HERNIA;  Surgeon: Ralene Ok, MD;  Location: [facility];  Service: General;  Laterality: Right;   INSERTION OF MESH  01/14/2012   Procedure: INSERTION OF MESH;  Surgeon: Ralene Ok, MD;  Location: [facility];  Service: General;  Laterality: Right;   KNEE SURGERY     LEFT HEART CATHETERIZATION WITH  CORONARY ANGIOGRAM N/A 10/21/2013   Procedure: LEFT HEART CATHETERIZATION WITH CORONARY ANGIOGRAM;  Surgeon: Blane Ohara, MD;  Location: [facility] C[facility];  Service: Cardiovascular;  Laterality: N/A;   MEDIAL PARTIAL KNEE REPLACEMENT Left     Social History:  reports that he has never smoked. He quit smokeless tobacco use about 10 years ago.  His smokeless tobacco use included chew. He reports that he does not drink alcohol and does not use drugs.  Allergies: No Known Allergies  Family History:  Family History  Problem Relation Age of Onset   Lung cancer Mother    Hypertension Mother    Diabetes Mother    Hyperlipidemia Mother    Emphysema Father    Heart failure Father    Melanoma Sister    Melanoma Paternal Grandmother    Heart attack Other    Colon cancer Neg Hx    Stroke Neg Hx    Esophageal cancer Neg Hx    Liver cancer Neg Hx    Pancreatic cancer Neg Hx    Stomach cancer Neg Hx    Rectal cancer Neg Hx      Current Outpatient Medications:    acetaminophen (TYLENOL) 500 MG tablet, Take 500-1,000 mg by mouth as needed for moderate pain., Disp: , Rfl:    ALPRAZolam (XANAX) 0.5 MG tablet, TAKE 1 TABLET BY MOUTH THREE TIMES A DAY AS NEEDED (Patient taking differently: Take 0.5 mg by mouth as needed for anxiety.), Disp: 60 tablet, Rfl: 0   aspirin EC 81 MG tablet, Take 81 mg by mouth at bedtime., Disp: , Rfl:    atorvastatin (LIPITOR) 20 MG tablet, TAKE 1 TABLET BY MOUTH EVERY DAY, Disp: 90 tablet, Rfl: 0   Azelaic Acid 15 % cream, APPLY 1 APPLICATION TOPICALLY AS DIRECTED. AFTER SKIN IS THOROUGHLY WASHED AND PATTED DRY, GENTLY BUT THOROUGHLY MASSAGE A THIN FILM OF AZELAIC ACID INTO THE AFFECTED AREA TWICE DAILY, IN THE MORNING AND EVENING. APPLIED TO FACE., Disp: 50 g, Rfl: 0   bisoprolol (ZEBETA) 10 MG tablet, Take 1 tablet (10 mg total) by mouth daily., Disp: 90 tablet, Rfl: 3   Calcium Carbonate Antacid (TUMS PO), Take 2-3 tablets by mouth as needed (heartburn)., Disp: , Rfl:     cholecalciferol (VITAMIN D) 25 MCG (1000 UNIT) tablet, Take 1,000 Units by mouth daily., Disp: , Rfl:    EPINEPHrine 0.3 mg/0.3 mL IJ SOAJ injection, Inject 0.3 mg into the muscle as needed for anaphylaxis., Disp: 1 each, Rfl: 0   glimepiride (AMARYL) 2 MG tablet, Take 1 tablet (2 mg total) by mouth daily with breakfast., Disp: 30 tablet, Rfl: 11   glucose blood (ONETOUCH VERIO) test strip, USE TO TEST BLOOD SUGAR ONCE A DAY DX E11.51 -, Disp: 100 strip, Rfl: 3   halobetasol (ULTRAVATE) 0.05 % cream, Apply 1 application  topically 2 (two) times daily as needed (dry skin)., Disp: , Rfl:    methocarbamol (  ROBAXIN) 500 MG tablet, Take 500 mg by mouth as needed for muscle spasms., Disp: , Rfl:    Multiple Vitamins-Minerals (MULTIVITAMIN WITH MINERALS) tablet, Take 1 tablet by mouth at bedtime., Disp: , Rfl:    nitroGLYCERIN (NITROSTAT) 0.4 MG SL tablet, DISSOLVE 1 TABLET BY MOUTH UNDER THE TONGUE EVERY 5 MINUTES AS NEEDED FOR CHEST PAIN (Patient taking differently: Place 0.4 mg under the tongue every 5 (five) minutes as needed for chest pain.), Disp: 75 tablet, Rfl: 5   ondansetron (ZOFRAN) 8 MG tablet, Take 1 tablet (8 mg total) by mouth every 8 (eight) hours as needed for nausea or vomiting., Disp: 20 tablet, Rfl: 0   sildenafil (VIAGRA) 100 MG tablet, Take 100 mg by mouth daily as needed for erectile dysfunction., Disp: , Rfl:    tamsulosin (FLOMAX) 0.4 MG CAPS capsule, Take 0.4 mg by mouth in the morning and at bedtime., Disp: , Rfl:    vitamin C (ASCORBIC ACID) 500 MG tablet, Take 1,000 mg by mouth daily. Reported on 08/23/2015, Disp: , Rfl:    oxybutynin (DITROPAN) 5 MG tablet, Take 5 mg by mouth at bedtime. (Patient not taking: Reported on 03/08/2022), Disp: , Rfl:   Review of Systems:  Constitutional: Denies fever, chills, diaphoresis. HEENT: Denies photophobia, eye pain, redness, hearing loss, ear pain, congestion, sore throat, rhinorrhea, sneezing, mouth sores, trouble swallowing, neck pain,  neck stiffness and tinnitus.   Respiratory: Denies SOB, DOE, cough, chest tightness,  and wheezing.   Cardiovascular: Denies chest pain, palpitations and leg swelling.  Gastrointestinal: Denies nausea, vomiting, abdominal pain, diarrhea, constipation, blood in stool and abdominal distention.  Genitourinary: Denies dysuria, urgency, frequency, hematuria, flank pain. Endocrine: Denies: hot or cold intolerance, sweats, changes in hair or nails, polyuria, polydipsia. Musculoskeletal: Denies myalgias, back pain, joint swelling, arthralgias and gait problem.  Skin: Denies pallor, rash and wound.  Neurological: Denies dizziness, seizures, syncope, weakness, light-headedness, numbness and headaches.  Hematological: Denies adenopathy. Easy bruising, personal or family bleeding history  Psychiatric/Behavioral: Denies suicidal ideation, mood changes, confusion, nervousness, sleep disturbance and agitation    Physical Exam: Vitals:   03/08/22 0918  BP: (!) 110/54  Pulse: 82  Temp: 98 F (36.7 C)  TempSrc: Oral  SpO2: 98%  Weight: 187 lb 11.2 oz (85.1 kg)    Body mass index is 30.3 kg/m.   Constitutional: NAD, calm, comfortable Eyes: PERRL, lids and conjunctivae normal ENMT: Mucous membranes are dry.  Respiratory: clear to auscultation bilaterally, no wheezing, no crackles. Normal respiratory effort. No accessory muscle use.  Cardiovascular: Regular rate and rhythm, no murmurs / rubs / gallops. No extremity edema.  Neurologic: Grossly intact and nonfocal Psychiatric: Normal judgment and insight. Alert and oriented x 3. Normal mood.    Impression and Plan:  Type 2 diabetes mellitus without complication, without long-term current use of insulin ([place]) - Plan: Urine microalbumin-creatinine with uACR  Hospital discharge follow-up  Shock ([place])  Lactic acidosis  Dyslipidemia  Essential hypertension  Diabetes mellitus with peripheral vascular disease ([place])  Malignant neoplasm of  prostate ([place])  Urinary retention  Acute renal failure with tubular necrosis ([place]) - Plan: CBC with Differential/Platelet  Hypokalemia  Hypomagnesemia - Plan: Magnesium  [facility] charts reviewed in great detail, complicated and prolonged hospital stay for hypovolemic shock and acute renal failure requiring ICU admission and CRRT. -Plan to check renal function, electrolytes today. -I am very concerned about his hypotension.  He is already off benazepril, I will also discontinue amlodipine.  He will continue bisoprolol  alone for now.  He is asked to do meticulous blood pressure records and contact me within the next 10 days if blood pressure remains low. -Urinary retention that he is currently experiencing can also contribute to renal failure.  Looks like he is in the midst of a voiding trial with orders to follow-up with urology this afternoon if unable to spontaneously void.  Advised to continue Flomax. -In hospital A1c was 5.3.  He is now off metformin due to lactic acidosis, he is on Amaryl 2 mg.  Too early to make decisions on this.  He will return in 3 months for repeat A1c.  Medical decision making of high complexity was utilized today.     Lelon Frohlich, MD [place] [facility]

## 2022-03-13 ENCOUNTER — Other Ambulatory Visit (INDEPENDENT_AMBULATORY_CARE_PROVIDER_SITE_OTHER): Payer: Medicare Other

## 2022-03-13 DIAGNOSIS — E876 Hypokalemia: Secondary | ICD-10-CM

## 2022-03-13 DIAGNOSIS — D539 Nutritional anemia, unspecified: Secondary | ICD-10-CM | POA: Diagnosis not present

## 2022-03-13 LAB — COMPREHENSIVE METABOLIC PANEL
ALT: 33 U/L (ref 0–53)
AST: 51 U/L — ABNORMAL HIGH (ref 0–37)
Albumin: 3 g/dL — ABNORMAL LOW (ref 3.5–5.2)
Alkaline Phosphatase: 60 U/L (ref 39–117)
BUN: 19 mg/dL (ref 6–23)
CO2: 27 mEq/L (ref 19–32)
Calcium: 8.5 mg/dL (ref 8.4–10.5)
Chloride: 100 mEq/L (ref 96–112)
Creatinine, Ser: 1.21 mg/dL (ref 0.40–1.50)
GFR: 61.1 mL/min (ref >60.00)
Glucose, Bld: 76 mg/dL (ref 70–99)
Potassium: 3.4 mEq/L — ABNORMAL LOW (ref 3.5–5.1)
Sodium: 138 mEq/L (ref 135–145)
Total Bilirubin: 0.7 mg/dL (ref 0.2–1.2)
Total Protein: 6 g/dL (ref 6.0–8.3)

## 2022-03-13 LAB — VITAMIN B12: Vitamin B-12: 301 pg/mL (ref 211–911)

## 2022-03-17 LAB — FOLATE RBC: RBC Folate: 1000 ng/mL RBC (ref >280)

## 2022-04-04 ENCOUNTER — Other Ambulatory Visit: Payer: Self-pay | Admitting: Internal Medicine

## 2022-05-15 ENCOUNTER — Ambulatory Visit: Payer: Medicare Other | Admitting: Internal Medicine

## 2022-05-17 ENCOUNTER — Ambulatory Visit: Payer: Medicare Other | Admitting: Internal Medicine

## 2022-05-25 LAB — BASIC METABOLIC PANEL: EGFR: 51.2

## 2022-06-03 ENCOUNTER — Other Ambulatory Visit: Payer: Self-pay | Admitting: Internal Medicine

## 2022-06-06 ENCOUNTER — Encounter: Payer: Self-pay | Admitting: Internal Medicine

## 2022-06-06 ENCOUNTER — Ambulatory Visit (INDEPENDENT_AMBULATORY_CARE_PROVIDER_SITE_OTHER): Payer: Medicare Other | Admitting: Internal Medicine

## 2022-06-06 VITALS — BP 160/82 | HR 53 | Temp 98.4°F | Ht 67.0 in | Wt 179.7 lb

## 2022-06-06 DIAGNOSIS — E785 Hyperlipidemia, unspecified: Secondary | ICD-10-CM | POA: Diagnosis not present

## 2022-06-06 DIAGNOSIS — R7989 Other specified abnormal findings of blood chemistry: Secondary | ICD-10-CM

## 2022-06-06 DIAGNOSIS — Z0000 Encounter for general adult medical examination without abnormal findings: Secondary | ICD-10-CM | POA: Diagnosis not present

## 2022-06-06 DIAGNOSIS — I10 Essential (primary) hypertension: Secondary | ICD-10-CM

## 2022-06-06 DIAGNOSIS — R972 Elevated prostate specific antigen [PSA]: Secondary | ICD-10-CM

## 2022-06-06 DIAGNOSIS — E119 Type 2 diabetes mellitus without complications: Secondary | ICD-10-CM

## 2022-06-06 DIAGNOSIS — H9193 Unspecified hearing loss, bilateral: Secondary | ICD-10-CM

## 2022-06-06 LAB — COMPREHENSIVE METABOLIC PANEL
ALT: 44 U/L (ref 0–53)
AST: 83 U/L — ABNORMAL HIGH (ref 0–37)
Albumin: 3.8 g/dL (ref 3.5–5.2)
Alkaline Phosphatase: 73 U/L (ref 39–117)
BUN: 17 mg/dL (ref 6–23)
CO2: 29 mEq/L (ref 19–32)
Calcium: 9.6 mg/dL (ref 8.4–10.5)
Chloride: 102 mEq/L (ref 96–112)
Creatinine, Ser: 1.13 mg/dL (ref 0.40–1.50)
GFR: 66.22 mL/min (ref >60.00)
Glucose, Bld: 77 mg/dL (ref 70–99)
Potassium: 3.9 mEq/L (ref 3.5–5.1)
Sodium: 136 mEq/L (ref 135–145)
Total Bilirubin: 0.5 mg/dL (ref 0.2–1.2)
Total Protein: 6.6 g/dL (ref 6.0–8.3)

## 2022-06-06 LAB — MICROALBUMIN / CREATININE URINE RATIO
Creatinine,U: 240.4 mg/dL
Microalb Creat Ratio: 22.8 mg/g (ref 0.0–30.0)
Microalb, Ur: 54.8 mg/dL — ABNORMAL HIGH (ref 0.0–1.9)

## 2022-06-06 LAB — CBC WITH DIFFERENTIAL/PLATELET
Basophils Absolute: 0 10*3/uL (ref 0.0–0.1)
Basophils Relative: 0.9 % (ref 0.0–3.0)
Eosinophils Absolute: 0.1 10*3/uL (ref 0.0–0.7)
Eosinophils Relative: 2.6 % (ref 0.0–5.0)
HCT: 30 % — ABNORMAL LOW (ref 39.0–52.0)
Hemoglobin: 9.5 g/dL — ABNORMAL LOW (ref 13.0–17.0)
Lymphocytes Relative: 19.9 % (ref 12.0–46.0)
Lymphs Abs: 1 10*3/uL (ref 0.7–4.0)
MCHC: 31.8 g/dL (ref 30.0–36.0)
MCV: 85.1 fl (ref 78.0–100.0)
Monocytes Absolute: 0.6 10*3/uL (ref 0.1–1.0)
Monocytes Relative: 12.4 % — ABNORMAL HIGH (ref 3.0–12.0)
Neutro Abs: 3.2 10*3/uL (ref 1.4–7.7)
Neutrophils Relative %: 64.2 % (ref 43.0–77.0)
Platelets: 149 10*3/uL — ABNORMAL LOW (ref 150.0–400.0)
RBC: 3.53 Mil/uL — ABNORMAL LOW (ref 4.22–5.81)
RDW: 18.9 % — ABNORMAL HIGH (ref 11.5–15.5)
WBC: 5.1 10*3/uL (ref 4.0–10.5)

## 2022-06-06 LAB — LIPID PANEL
Cholesterol: 151 mg/dL (ref 0–200)
HDL: 68.6 mg/dL (ref >39.00)
LDL Cholesterol: 66 mg/dL (ref 0–99)
NonHDL: 82.63
Total CHOL/HDL Ratio: 2
Triglycerides: 85 mg/dL (ref 0.0–149.0)
VLDL: 17 mg/dL (ref 0.0–40.0)

## 2022-06-06 LAB — HEMOGLOBIN A1C: Hgb A1c MFr Bld: 5.4 % (ref 4.6–6.5)

## 2022-06-06 NOTE — Progress Notes (Signed)
Established Patient Office Visit     CC/Reason for Visit: Subsequent Medicare wellness visit  HPI: Jose [name] is a 71 y.o. male who is coming in today for the above mentioned reasons. Past Medical History is significant for: Hypertension, hyperlipidemia, type 2 diabetes.  He was admitted with septic shock and acute renal failure in December.  He is followed by urology.  He is still doing self-catheterization 2-3 times a day.  He is overdue for an eye exam, is complaining of decreased hearing.  Is due for shingles vaccine.  He had a colonoscopy in 2019.  Twice on Sundays he has noticed a low blood sugar.  He does note that on some days he wakes up early to go to church service and does not usually eat breakfast.  His only sugar lowering medication is Amaryl 2 mg that he takes every morning.   Past Medical/Surgical History: Past Medical History:  Diagnosis Date   ALCOHOL ABUSE 07/27/2008   Allergy    ANXIETY 07/27/2008   Arthritis    Bifascicular block    beta blocker stopped due to profound bradycardia   CAD 07/27/2008   a. s/p Endeavor DES to CFX 07/2008; residual OM1 70%, EF 45%;  b. relook cath 5/10: patent stent in CFX;  c.  LHC (8/15):  prox LAD 30%, OM1 50% at bifurcation, OM1 sub-branch 50%, mid AVCFX stent patent, EF 55-65% - no change from 2010 >>> Med Rx (after abnormal nuc)    Cataract    Diabetes mellitus without complication    GERD [id number]   History of TIA (transient ischemic attack) 07/2008   Hx of adenomatous colonic polyps    Hx of cardiovascular stress test    ETT-Myoview (09/2013):  Partially fixed defect with some reversibility - cannot r/o inf ischemia vs diaph atten, EF 51%; mod risk   Hyperlipidemia    HYPERTENSION 09/18/2006   LOW BACK PAIN 09/18/2006   Myocardial infarction 2010   Numbness and tingling    finger tips at times   Rosacea 07/27/2008   SEIZURE DISORDER 09/18/2006   none since 2008   Squamous cell carcinoma of skin 07/19/2021   in  situ - right nasal sidewall   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   Urinary tract infection start cipro 01-07-2012    Past Surgical History:  Procedure Laterality Date   CORONARY STENT PLACEMENT     drug eluting   GOLD SEED IMPLANT N/A 04/11/2021   Procedure: GOLD SEED IMPLANT SPACE OAR;  Surgeon: Robley Fries, MD;  Location: [facility];  Service: Urology;  Laterality: N/A;   HERNIA REPAIR     INGUINAL HERNIA REPAIR  01/14/2012   Procedure: LAPAROSCOPIC INGUINAL HERNIA;  Surgeon: Ralene Ok, MD;  Location: [facility];  Service: General;  Laterality: Right;   INSERTION OF MESH  01/14/2012   Procedure: INSERTION OF MESH;  Surgeon: Ralene Ok, MD;  Location: [facility];  Service: General;  Laterality: Right;   KNEE SURGERY     LEFT HEART CATHETERIZATION WITH CORONARY ANGIOGRAM N/A 10/21/2013   Procedure: LEFT HEART CATHETERIZATION WITH CORONARY ANGIOGRAM;  Surgeon: Blane Ohara, MD;  Location: [facility] CATH LAB;  Service: Cardiovascular;  Laterality: N/A;   MEDIAL PARTIAL KNEE REPLACEMENT Left     Social History:  reports that he has never smoked. He quit smokeless tobacco use about 10 years ago.  His smokeless tobacco use included chew. He reports that he does not drink alcohol and does not use drugs.  Allergies: No Known Allergies  Family History:  Family History  Problem Relation Age of Onset   Lung cancer Mother    Hypertension Mother    Diabetes Mother    Hyperlipidemia Mother    Emphysema Father    Heart failure Father    Melanoma Sister    Melanoma Paternal Grandmother    Heart attack Other    Colon cancer Neg Hx    Stroke Neg Hx    Esophageal cancer Neg Hx    Liver cancer Neg Hx    Pancreatic cancer Neg Hx    Stomach cancer Neg Hx    Rectal cancer Neg Hx      Current Outpatient Medications:    acetaminophen (TYLENOL) 500 MG tablet, Take 500-1,000 mg by mouth as needed for moderate pain., Disp: , Rfl:    ALPRAZolam (XANAX) 0.5 MG tablet, TAKE 1 TABLET  BY MOUTH THREE TIMES A DAY AS NEEDED (Patient taking differently: Take 0.5 mg by mouth as needed for anxiety.), Disp: 60 tablet, Rfl: 0   aspirin EC 81 MG tablet, Take 81 mg by mouth at bedtime., Disp: , Rfl:    atorvastatin (LIPITOR) 20 MG tablet, TAKE 1 TABLET BY MOUTH EVERY DAY, Disp: 90 tablet, Rfl: 0   Azelaic Acid 15 % cream, APPLY 1 APPLICATION TOPICALLY AS DIRECTED. AFTER SKIN IS THOROUGHLY WASHED AND PATTED DRY, GENTLY BUT THOROUGHLY MASSAGE A THIN FILM OF AZELAIC ACID INTO THE AFFECTED AREA TWICE DAILY, IN THE MORNING AND EVENING. APPLIED TO FACE., Disp: 50 g, Rfl: 0   bisoprolol (ZEBETA) 10 MG tablet, Take 1 tablet (10 mg total) by mouth daily., Disp: 90 tablet, Rfl: 3   EPINEPHrine 0.3 mg/0.3 mL IJ SOAJ injection, Inject 0.3 mg into the muscle as needed for anaphylaxis., Disp: 1 each, Rfl: 0   glimepiride (AMARYL) 2 MG tablet, Take 1 tablet (2 mg total) by mouth daily with breakfast., Disp: 30 tablet, Rfl: 11   glucose blood (ONETOUCH VERIO) test strip, USE TO TEST BLOOD SUGAR ONCE A DAY DX E11.51 -, Disp: 100 strip, Rfl: 3   halobetasol (ULTRAVATE) 0.05 % cream, Apply 1 application  topically 2 (two) times daily as needed (dry skin)., Disp: , Rfl:    Multiple Vitamins-Minerals (MULTIVITAMIN WITH MINERALS) tablet, Take 1 tablet by mouth at bedtime., Disp: , Rfl:    nitroGLYCERIN (NITROSTAT) 0.4 MG SL tablet, DISSOLVE 1 TABLET BY MOUTH UNDER THE TONGUE EVERY 5 MINUTES AS NEEDED FOR CHEST PAIN (Patient taking differently: Place 0.4 mg under the tongue every 5 (five) minutes as needed for chest pain.), Disp: 75 tablet, Rfl: 5   ondansetron (ZOFRAN) 8 MG tablet, Take 1 tablet (8 mg total) by mouth every 8 (eight) hours as needed for nausea or vomiting., Disp: 20 tablet, Rfl: 0   tamsulosin (FLOMAX) 0.4 MG CAPS capsule, Take 0.4 mg by mouth in the morning and at bedtime., Disp: , Rfl:    oxybutynin (DITROPAN) 5 MG tablet, Take 5 mg by mouth at bedtime. (Patient not taking: Reported on 06/04/2022),  Disp: , Rfl:    sildenafil (VIAGRA) 100 MG tablet, Take 100 mg by mouth daily as needed for erectile dysfunction., Disp: , Rfl:   Review of Systems:  Negative unless indicated in HPI.   Physical Exam: Vitals:   06/06/22 0953 06/06/22 0957  BP: (!) 150/78 (!) 160/82  Pulse: (!) 53   Temp: 98.4 F (36.9 C)   TempSrc: Oral   SpO2: 99%   Weight: 179 lb 11.2 oz (  81.5 kg)   Height: 5\' 7"  (1.702 m)     Body mass index is 28.14 kg/m.   Physical Exam Vitals reviewed.  Constitutional:      General: He is not in acute distress.    Appearance: Normal appearance. He is not ill-appearing, toxic-appearing or diaphoretic.  HENT:     Head: Normocephalic.     Right Ear: Tympanic membrane, ear canal and external ear normal. There is no impacted cerumen.     Left Ear: Tympanic membrane, ear canal and external ear normal. There is no impacted cerumen.     Nose: Nose normal.     Mouth/Throat:     Mouth: Mucous membranes are moist.     Pharynx: Oropharynx is clear. No oropharyngeal exudate or posterior oropharyngeal erythema.  Eyes:     General: No scleral icterus.       Right eye: No discharge.        Left eye: No discharge.     Conjunctiva/sclera: Conjunctivae normal.     Pupils: Pupils are equal, round, and reactive to light.  Neck:     Vascular: No carotid bruit.  Cardiovascular:     Rate and Rhythm: Normal rate and regular rhythm.     Pulses: Normal pulses.     Heart sounds: Normal heart sounds.  Pulmonary:     Effort: Pulmonary effort is normal. No respiratory distress.     Breath sounds: Normal breath sounds.  Abdominal:     General: Abdomen is flat. Bowel sounds are normal.     Palpations: Abdomen is soft.  Musculoskeletal:        General: Normal range of motion.     Cervical back: Normal range of motion.  Skin:    General: Skin is warm and dry.  Neurological:     General: No focal deficit present.     Mental Status: He is alert and oriented to person, place, and time.  Mental status is at baseline.  Psychiatric:        Mood and Affect: Mood normal.        Behavior: Behavior normal.        Thought Content: Thought content normal.        Judgment: Judgment normal.    Subsequent Medicare wellness visit   1. Risk factors, based on past  M,S,F - Cardiac Risk Factors include: advanced age (>30men, >23 women);male gender;diabetes mellitus   2.  Physical activities: Dietary issues and exercise activities discussed:      3.  Depression/mood:  [place] Office Visit from 06/06/2022 in [place] at [facility] Total Score 5        4.  ADL's:    06/04/2022    2:31 PM 02/22/2022    8:00 AM  In your present state of health, do you have any difficulty performing the following activities:  Hearing? 1 0  Comment with a crowd of people   Vision? 1 0  Comment at night   Difficulty concentrating or making decisions? 1 0  Comment remembering   Walking or climbing stairs? 1 0  Comment knees replaced   Dressing or bathing? 0 0  Doing errands, shopping? 0 0  Preparing Food and eating ? N   Using the Toilet? N   In the past six months, have you accidently leaked urine? N   Do you have problems with loss of bowel control? N   Managing your Medications? N   Managing your Finances? N  Housekeeping or managing your Housekeeping? N      5.  Fall risk:     02/24/2022   12:00 PM 02/24/2022    9:30 PM 02/25/2022   10:00 AM 03/08/2022    9:24 AM 06/04/2022    2:37 PM  Fall Risk  Falls in the past year?    1 1  Was there an injury with Fall?    0 0  Fall Risk Category Calculator    2 1  Fall Risk Category (Retired)    Moderate   (RETIRED) Patient Fall Risk Level Low fall risk Low fall risk Low fall risk Moderate fall risk   Patient at Risk for Falls Due to    Impaired balance/gait   Fall risk Follow up    Falls evaluation completed Falls evaluation completed     6.  Home safety: No problems identified   7.  Height weight, and  visual acuity: height and weight as above, vision/hearing: Vision Screening   Right eye Left eye Both eyes  Without correction 20/25 20/25 20/25   With correction        8.  Counseling: Counseling given: Not Answered    9. Lab orders based on risk factors: Laboratory update will be reviewed   10. Cognitive assessment:        06/04/2022    2:37 PM  6CIT Screen  What Year? 0 points  What month? 0 points  What time? 0 points  Count back from 20 0 points  Months in reverse 0 points  Repeat phrase 0 points  Total Score 0 points     11. Screening: Patient provided with a written and personalized 5-10 year screening schedule in the AVS. Health Maintenance  Topic Date Due   Hepatitis C Screening: USPSTF Recommendation to screen - Ages 39-79 yo.  Never done   Yearly kidney health urinalysis for diabetes  08/04/2016   Complete foot exam   06/04/2018   Zoster (Shingles) Vaccine (1 of 2) 06/07/2022*   COVID-19 Vaccine (3 - Pfizer risk series) 06/21/2022*   Eye exam for diabetics  12/06/2022*   Hemoglobin A1C  08/26/2022   Colon Cancer Screening  09/17/2022   Flu Shot  10/04/2022   Yearly kidney function blood test for diabetes  05/25/2023   Medicare Annual Wellness Visit  06/06/2023   DTaP/Tdap/Td vaccine (2 - Td or Tdap) 06/26/2023   Pneumonia Vaccine  Completed   HPV Vaccine  Aged Out  *Topic was postponed. The date shown is not the original due date.    12. Provider List Update: Patient Care Team    Relationship Specialty Notifications Start End  [name], [name], MD PCP - General Internal Medicine  02/20/18   [name], MD PCP - Cardiology Cardiology Admissions 06/08/19   Lavonna Monarch, MD (Inactive) Consulting Physician Dermatology  07/19/21   Robley Fries, MD Consulting Physician Urology  08/08/21   Tyler Pita, MD Consulting Physician Radiation Oncology  08/08/21   Gardenia Phlegm, NP Nurse Practitioner Hematology and Oncology  08/08/21    Harmon Pier, RN Registered Nurse   08/08/21   Lucas Mallow, MD Consulting Physician Urology  08/08/21      13. Advance Directives: Does Patient Have a Medical Advance Directive?: No Would patient like information on creating a medical advance directive?: No - Patient declined  14. Opioids: Patient is not on any opioid prescriptions and has no risk factors for a substance use disorder.   15.  Goals      diabetes         I have personally reviewed and noted the following in the patient's chart:   Medical and social history Use of alcohol, tobacco or illicit drugs  Current medications and supplements Functional ability and status Nutritional status Physical activity Advanced directives List of other physicians Hospitalizations, surgeries, and ER visits in previous 12 months Vitals Screenings to include cognitive, depression, and falls Referrals and appointments  In addition, I have reviewed and discussed with patient certain preventive protocols, quality metrics, and best practice recommendations. A written personalized care plan for preventive services as well as general preventive health recommendations were provided to patient.   Impression and Plan:  Encounter for preventive health examination  Dyslipidemia - Plan: Lipid panel  Essential hypertension  Elevated PSA  TSH elevation  Type 2 diabetes mellitus without complication, without long-term current use of insulin - Plan: Urine microalbumin-creatinine with uACR, CBC with Differential/Platelet, Comprehensive metabolic panel, Hemoglobin A1c, CANCELED: Urine microalbumin-creatinine with uACR  Decreased hearing of both ears - Plan: Ambulatory referral to Audiology  -Recommend routine eye and dental care. -Healthy lifestyle discussed in detail. -Labs to be updated today. -Prostate cancer screening: PSA 0.6 3/24 Health Maintenance  Topic Date Due   Hepatitis C Screening: USPSTF Recommendation to screen -  Ages 59-79 yo.  Never done   Yearly kidney health urinalysis for diabetes  08/04/2016   Complete foot exam   06/04/2018   Zoster (Shingles) Vaccine (1 of 2) 06/07/2022*   COVID-19 Vaccine (3 - Pfizer risk series) 06/21/2022*   Eye exam for diabetics  12/06/2022*   Hemoglobin A1C  08/26/2022   Colon Cancer Screening  09/17/2022   Flu Shot  10/04/2022   Yearly kidney function blood test for diabetes  05/25/2023   Medicare Annual Wellness Visit  06/06/2023   DTaP/Tdap/Td vaccine (2 - Td or Tdap) 06/26/2023   Pneumonia Vaccine  Completed   HPV Vaccine  Aged Out  *Topic was postponed. The date shown is not the original due date.         Lelon Frohlich, MD [facility]

## 2022-06-07 ENCOUNTER — Other Ambulatory Visit (HOSPITAL_COMMUNITY): Payer: Self-pay | Admitting: Urology

## 2022-06-07 DIAGNOSIS — R102 Pelvic and perineal pain: Secondary | ICD-10-CM

## 2022-06-07 DIAGNOSIS — M545 Low back pain, unspecified: Secondary | ICD-10-CM

## 2022-06-07 DIAGNOSIS — C61 Malignant neoplasm of prostate: Secondary | ICD-10-CM

## 2022-06-14 ENCOUNTER — Ambulatory Visit: Payer: Medicare Other | Attending: Audiologist | Admitting: Audiologist

## 2022-06-14 DIAGNOSIS — H903 Sensorineural hearing loss, bilateral: Secondary | ICD-10-CM | POA: Diagnosis present

## 2022-06-14 NOTE — Procedures (Signed)
  [facility] [address] [place], [place]  [address] [phone]  AUDIOLOGICAL  EVALUATION  NAME: Jose [name]     DOB:   [birth year]      MRN: [id number]                                                                                     DATE: 06/14/2022     REFERENT: Philip Aspen, Limmie Patricia, MD STATUS: Outpatient DIAGNOSIS: Sensorineural Hearing Loss Bilateral     History: [name] was seen for an audiological evaluation. [name] was accompanied to the appointment by his wife. Miking is receiving a hearing evaluation due to concerns for hearing loss. [name] has difficulty hearing people clearly. This difficulty began gradually. No pain or pressure reported in either ear. Tinnitus intermittent in both ears. [name] has no noise exposure history.  Medical history positive for diabetes which is a risk factor for hearing loss. No other relevant case history reported.   Evaluation:  Otoscopy showed a clear view of the tympanic membranes, bilaterally Tympanometry results were consistent with normal middle ear function, bilaterally   Audiometric testing was completed using conventional audiometry with supraural transducer. Speech Recognition Thresholds were 20dB in the right ear and 25dB in the left ear. Word Recognition was performed 40dB SL, scored  100% in the right ear and 100% in the left ear. Pure tone thresholds show normal hearing sloping to moderately severe sensorineural hearing loss each ear.   Results:  The test results were reviewed with [name] and his wife. [name] has a moderately severe high pitched hearing loss. He needs to try hearing aids. He was given a copy of his audiogram.    Recommendations: Amplification is necessary for both ears. Hearing aids can be purchased from a variety of locations. See provided list for locations in the [place] area.    25 minutes spent testing and counseling on results.   [name]  Audiologist, Au.D.,  CCC-A 06/14/2022  2:29 PM  Cc: Philip Aspen, Limmie Patricia, MD

## 2022-06-28 ENCOUNTER — Encounter (HOSPITAL_COMMUNITY)
Admission: RE | Admit: 2022-06-28 | Discharge: 2022-06-28 | Disposition: A | Discharge status: Home / Self Care | Payer: Medicare Other | Source: Ambulatory Visit | Attending: Urology | Admitting: Urology

## 2022-06-28 DIAGNOSIS — M545 Low back pain, unspecified: Secondary | ICD-10-CM

## 2022-06-28 DIAGNOSIS — R102 Pelvic and perineal pain: Secondary | ICD-10-CM | POA: Diagnosis present

## 2022-06-28 DIAGNOSIS — C61 Malignant neoplasm of prostate: Secondary | ICD-10-CM | POA: Diagnosis not present

## 2022-06-28 MED ORDER — PIFLIFOLASTAT F 18 (PYLARIFY) INJECTION
9.0000 | Freq: Once | INTRAVENOUS | Status: AC
  Administered 2022-06-28: 9.08 via INTRAVENOUS

## 2022-07-23 ENCOUNTER — Ambulatory Visit: Payer: Medicare Other | Admitting: Dermatology

## 2022-07-30 ENCOUNTER — Other Ambulatory Visit: Payer: Self-pay | Admitting: Cardiovascular Disease

## 2022-08-30 ENCOUNTER — Other Ambulatory Visit: Payer: Self-pay | Admitting: Internal Medicine

## 2022-09-11 ENCOUNTER — Other Ambulatory Visit: Payer: Self-pay | Admitting: Internal Medicine

## 2022-09-11 MED ORDER — ALPRAZOLAM 0.5 MG PO TABS: 0.5000 mg | ORAL_TABLET | Freq: Three times a day (TID) | ORAL | 0 refills | Status: DC | PRN

## 2022-10-25 ENCOUNTER — Ambulatory Visit: Payer: Medicare Other | Attending: Cardiovascular Disease | Admitting: Cardiovascular Disease

## 2022-10-25 ENCOUNTER — Encounter: Payer: Self-pay | Admitting: Cardiovascular Disease

## 2022-10-25 VITALS — BP 142/80 | HR 70 | Ht 67.0 in | Wt 189.1 lb

## 2022-10-25 DIAGNOSIS — E782 Mixed hyperlipidemia: Secondary | ICD-10-CM

## 2022-10-25 DIAGNOSIS — I2510 Atherosclerotic heart disease of native coronary artery without angina pectoris: Secondary | ICD-10-CM | POA: Diagnosis present

## 2022-10-25 DIAGNOSIS — I10 Essential (primary) hypertension: Secondary | ICD-10-CM

## 2022-10-25 DIAGNOSIS — I452 Bifascicular block: Secondary | ICD-10-CM | POA: Insufficient documentation

## 2022-10-25 NOTE — Patient Instructions (Signed)

## 2022-10-25 NOTE — Progress Notes (Signed)
Cardiology Office Note:    Date:  10/25/2022   ID:  DIMITAR SCHAMP, DOB 1950/08/31, MRN [id number]  PCP:  Philip Aspen, Limmie Patricia, MD   [facility] Providers Cardiologist:  Tonny Bollman, MD     Referring MD: Philip Aspen, [name]   Chief Complaint  Patient presents with   Coronary Artery Disease    History of Present Illness:    Jose [name] is a 72 y.o. male with a hx of:  Coronary artery disease  S/p DES to LCx in 07/2008 Cath 10/2013: patent LCx stent  Hypertension  Hyperlipidemia  GERD  Bifascicular block (RBBB, LAFB) Hx of TIA Hx of seizures   The patient is here with his wife today.  He is doing pretty well overall and denies symptoms of chest pain or shortness of breath.  He is not engaged in regular exercise.  He states that the hot weather bothers him.  He was hospitalized in December 2023 with shock and acute renal failure.  His medications were adjusted at that time.  He was taken off of his ACE inhibitor.  He is also stopped amlodipine in the past because of leg swelling.  Past Medical History:  Diagnosis Date   ALCOHOL ABUSE 07/27/2008   Allergy    ANXIETY 07/27/2008   Arthritis    Bifascicular block    beta blocker stopped due to profound bradycardia   CAD 07/27/2008   a. s/p Endeavor DES to CFX 07/2008; residual OM1 70%, EF 45%;  b. relook cath 5/10: patent stent in CFX;  c.  LHC (8/15):  prox LAD 30%, OM1 50% at bifurcation, OM1 sub-branch 50%, mid AVCFX stent patent, EF 55-65% - no change from 2010 >>> Med Rx (after abnormal nuc)    Cataract    Diabetes mellitus without complication (HCC)    GERD 09/18/2006   History of TIA (transient ischemic attack) 07/2008   Hx of adenomatous colonic polyps    Hx of cardiovascular stress test    ETT-Myoview (09/2013):  Partially fixed defect with some reversibility - cannot r/o inf ischemia vs diaph atten, EF 51%; mod risk   Hyperlipidemia    HYPERTENSION 09/18/2006   LOW BACK PAIN 09/18/2006    Myocardial infarction (HCC) 2010   Numbness and tingling    finger tips at times   Rosacea 07/27/2008   SEIZURE DISORDER 09/18/2006   none since 2008   Squamous cell carcinoma of skin 07/19/2021   in situ - right nasal sidewall   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   Urinary tract infection start cipro 01-07-2012    Past Surgical History:  Procedure Laterality Date   CORONARY STENT PLACEMENT     drug eluting   GOLD SEED IMPLANT N/A 04/11/2021   Procedure: GOLD SEED IMPLANT SPACE OAR;  Surgeon: Noel Christmas, MD;  Location: [facility] [place];  Service: Urology;  Laterality: N/A;   HERNIA REPAIR     INGUINAL HERNIA REPAIR  01/14/2012   Procedure: LAPAROSCOPIC INGUINAL HERNIA;  Surgeon: Axel Filler, MD;  Location: [facility];  Service: General;  Laterality: Right;   INSERTION OF MESH  01/14/2012   Procedure: INSERTION OF MESH;  Surgeon: Axel Filler, MD;  Location: [facility];  Service: General;  Laterality: Right;   KNEE SURGERY     LEFT HEART CATHETERIZATION WITH CORONARY ANGIOGRAM N/A 10/21/2013   Procedure: LEFT HEART CATHETERIZATION WITH CORONARY ANGIOGRAM;  Surgeon: Micheline Chapman, MD;  Location[facility];  Service: Cardiovascular;  Laterality: N/A;  MEDIAL PARTIAL KNEE REPLACEMENT Left     Current Medications: Current Meds  Medication Sig   acetaminophen (TYLENOL) 500 MG tablet Take 500-1,000 mg by mouth as needed for moderate pain.   ALPRAZolam (XANAX) 0.5 MG tablet Take 1 tablet (0.5 mg total) by mouth 3 (three) times daily as needed.   amLODipine (NORVASC) 5 MG tablet Take 5 mg by mouth daily.   aspirin EC 81 MG tablet Take 81 mg by mouth at bedtime.   atorvastatin (LIPITOR) 20 MG tablet TAKE 1 TABLET BY MOUTH EVERY DAY   Azelaic Acid 15 % cream APPLY 1 APPLICATION TOPICALLY AS DIRECTED. AFTER SKIN IS THOROUGHLY WASHED AND PATTED DRY, GENTLY BUT THOROUGHLY MASSAGE A THIN FILM OF AZELAIC ACID INTO THE AFFECTED AREA TWICE DAILY, IN THE MORNING AND EVENING. APPLIED TO  FACE.   bisoprolol (ZEBETA) 10 MG tablet TAKE 1 TABLET BY MOUTH EVERY DAY   EPINEPHrine 0.3 mg/0.3 mL IJ SOAJ injection Inject 0.3 mg into the muscle as needed for anaphylaxis.   glimepiride (AMARYL) 2 MG tablet Take 1 tablet (2 mg total) by mouth daily with breakfast.   glucose blood (ONETOUCH VERIO) test strip USE TO TEST BLOOD SUGAR ONCE A DAY DX E11.51 -   halobetasol (ULTRAVATE) 0.05 % cream Apply 1 application  topically 2 (two) times daily as needed (dry skin).   melatonin 3 MG TABS tablet Take 3 mg by mouth at bedtime.   Multiple Vitamins-Minerals (MULTIVITAMIN WITH MINERALS) tablet Take 1 tablet by mouth at bedtime.   nitroGLYCERIN (NITROSTAT) 0.4 MG SL tablet DISSOLVE 1 TABLET BY MOUTH UNDER THE TONGUE EVERY 5 MINUTES AS NEEDED FOR CHEST PAIN (Patient taking differently: Place 0.4 mg under the tongue every 5 (five) minutes as needed for chest pain.)   ondansetron (ZOFRAN) 8 MG tablet Take 1 tablet (8 mg total) by mouth every 8 (eight) hours as needed for nausea or vomiting.   oxybutynin (DITROPAN) 5 MG tablet Take 5 mg by mouth at bedtime.   sildenafil (VIAGRA) 100 MG tablet Take 100 mg by mouth daily as needed for erectile dysfunction.   tamsulosin (FLOMAX) 0.4 MG CAPS capsule Take 0.4 mg by mouth in the morning and at bedtime.     Allergies:   Patient has no known allergies.   Social History   Socioeconomic History   Marital status: Married    Spouse name: Not on file   Number of children: 2   Years of education: Not on file   Highest education level: Not on file  Occupational History   Occupation: Chief Executive Officer    Employer: [employer]  Tobacco Use   Smoking status: Never   Smokeless tobacco: Former    Types: Chew    Quit date: 02/03/2012  Vaping Use   Vaping status: Never Used  Substance and Sexual Activity   Alcohol use: No    Comment: past drinker 2-3 daily and sometimes binge drinks on the weekend   Drug use: No   Sexual activity:  Yes  Other Topics Concern   Not on file  Social History Narrative   Not on file   Social Determinants of Health   Financial Resource Strain: Low Risk  (06/04/2022)   Overall Financial Resource Strain (CARDIA)    Difficulty of Paying Living Expenses: Not hard at all  Food Insecurity: No Food Insecurity (06/04/2022)   Hunger Vital Sign    Worried About Running Out of Food in the Last Year: Never true    Ran  Out of Food in the Last Year: Never true  Transportation Needs: No Transportation Needs (06/04/2022)   PRAPARE - Administrator, Civil Service (Medical): No    Lack of Transportation (Non-Medical): No  Physical Activity: Insufficiently Active (06/04/2022)   Exercise Vital Sign    Days of Exercise per Week: 3 days    Minutes of Exercise per Session: 30 min  Stress: No Stress Concern Present (06/04/2022)   [employer] of Occupational Health - Occupational Stress Questionnaire    Feeling of Stress : Only a little  Social Connections: Socially Integrated (06/04/2022)   Social Connection and Isolation Panel [NHANES]    Frequency of Communication with Friends and Family: More than three times a week    Frequency of Social Gatherings with Friends and Family: More than three times a week    Attends Religious Services: More than 4 times per year    Active Member of [employer] or Organizations: Yes    Attends Engineer, structural: More than 4 times per year    Marital Status: Married     Family History: The patient's family history includes Diabetes in his mother; Emphysema in his father; Heart attack in an other family member; Heart failure in his father; Hyperlipidemia in his mother; Hypertension in his mother; Lung cancer in his mother; Melanoma in his paternal grandmother and sister. There is no history of Colon cancer, Stroke, Esophageal cancer, Liver cancer, Pancreatic cancer, Stomach cancer, or Rectal cancer.  ROS:   Please see the history of present illness.    All  other systems reviewed and are negative.  EKGs/Labs/Other Studies Reviewed:    The following studies were reviewed today: Echo 02/21/2022: 1. Left ventricular ejection fraction, by estimation, is 70 to 75%. Left  ventricular ejection fraction by 2D MOD biplane is 70.2 %. The left  ventricle has hyperdynamic function. The left ventricle has no regional  wall motion abnormalities. There is mild   left ventricular hypertrophy. Left ventricular diastolic parameters are  consistent with Grade I diastolic dysfunction (impaired relaxation).   2. Right ventricular systolic function is mildly reduced. The right  ventricular size is normal.   3. The mitral valve is abnormal. Trivial mitral valve regurgitation.   4. The aortic valve is tricuspid. Aortic valve regurgitation is not  visualized. Aortic valve sclerosis is present, with no evidence of aortic  valve stenosis. Aortic valve mean gradient measures 7.0 mmHg.   5. The inferior vena cava is normal in size with greater than 50%  respiratory variability, suggesting right atrial pressure of 3 mmHg.   6. Increased flow velocities may be secondary to anemia, thyrotoxicosis,  hyperdynamic or high flow state.   Comparison(s): No prior Echocardiogram.  EKG Interpretation Date/Time:  Thursday October 25 2022 10:03:22 EDT Ventricular Rate:  71 PR Interval:  162 QRS Duration:  160 QT Interval:  418 QTC Calculation: 454 R Axis:   -64  Text Interpretation: Normal sinus rhythm Right bundle branch block Left anterior fascicular block Bifascicular block  Cannot rule out Inferior infarct (masked by fascicular block?) , age undetermined Anterior infarct , age undetermined When compared with ECG of 20-Feb-2022 13:44, PREVIOUS ECG IS PRESENT No significant change was found Confirmed by Tonny Bollman [phone]) on 10/25/2022 1:09:54 PM    Recent Labs: 03/08/2022: Magnesium 1.4 06/06/2022: ALT 44; BUN 17; Creatinine, Ser 1.13; Hemoglobin 9.5; Platelets 149.0;  Potassium 3.9; Sodium 136  Recent Lipid Panel    Component Value Date/Time   CHOL 151  06/06/2022 1028   TRIG 85.0 06/06/2022 1028   HDL 68.60 06/06/2022 1028   CHOLHDL 2 06/06/2022 1028   VLDL 17.0 06/06/2022 1028   LDLCALC 66 06/06/2022 1028   LDLDIRECT 163.3 07/09/2007 0818     Risk Assessment/Calculations:           Physical Exam:    VS:  BP (!) 142/80   Pulse 70   Ht 5\' 7"  (1.702 m)   Wt 189 lb 1.6 oz (85.8 kg)   SpO2 98%   BMI 29.62 kg/m     Wt Readings from Last 3 Encounters:  10/25/22 189 lb 1.6 oz (85.8 kg)  06/06/22 179 lb 11.2 oz (81.5 kg)  03/08/22 187 lb 11.2 oz (85.1 kg)     GEN:  Well nourished, well developed in no acute distress HEENT: Normal NECK: No JVD; No carotid bruits LYMPHATICS: No lymphadenopathy CARDIAC: RRR, no murmurs, rubs, gallops RESPIRATORY:  Clear to auscultation without rales, wheezing or rhonchi  ABDOMEN: Soft, non-tender, non-distended MUSCULOSKELETAL: Trace bilateral pretibial edema; No deformity  SKIN: Warm and dry NEUROLOGIC:  Alert and oriented x 3 PSYCHIATRIC:  Normal affect   ASSESSMENT:    1. Essential hypertension   2. Coronary artery disease involving native coronary artery of native heart without angina pectoris   3. Mixed hyperlipidemia   4. Bifascicular block    PLAN:    In order of problems listed above:  Patient appears clinically stable.  Amlodipine and ACE inhibitor both been discontinued.  I think while he would benefit from an ACE inhibitor in the setting of diabetes and CAD, the risk of AKI probably outweighs the benefit of this medication.  The patient continues to self catheterize and is at risk of infection.  Will continue his current medical program. No anginal symptoms reported.  Continue aspirin for antiplatelet therapy, statin drug, and a beta-blocker. Treated with atorvastatin 20 mg daily.  LDL cholesterol is 66. Stable, asymptomatic           Medication Adjustments/Labs and Tests  Ordered: Current medicines are reviewed at length with the patient today.  Concerns regarding medicines are outlined above.  Orders Placed This Encounter  Procedures   EKG 12-Lead   No orders of the defined types were placed in this encounter.   Patient Instructions  Medication Instructions:  Your physician recommends that you continue on your current medications as directed. Please refer to the Current Medication list given to you today.  *If you need a refill on your cardiac medications before your next appointment, please call your pharmacy*   Lab Work: NONE If you have labs (blood work) drawn today and your tests are completely normal, you will receive your results only by: MyChart Message (if you have MyChart) OR A paper copy in the mail If you have any lab test that is abnormal or we need to change your treatment, we will call you to review the results.   Testing/Procedures: NONE   Follow-Up: At [facility], you and your health needs are our priority.  As part of our continuing mission to provide you with exceptional heart care, we have created designated Provider Care Teams.  These Care Teams include your primary Cardiologist (physician) and Advanced Practice Providers (APPs -  Physician Assistants and Nurse Practitioners) who all work together to provide you with the care you need, when you need it.  We recommend signing up for the patient portal called "MyChart".  Sign up information is provided on this After  Visit Summary.  MyChart is used to connect with patients for Virtual Visits (Telemedicine).  Patients are able to view lab/test results, encounter notes, upcoming appointments, etc.  Non-urgent messages can be sent to your provider as well.   To learn more about what you can do with MyChart, go to [email or web address].    Your next appointment:   1 year(s)  Provider:   Tonny Bollman, MD        Signed, Tonny Bollman, MD  10/25/2022 1:13 PM     [facility]

## 2022-10-29 ENCOUNTER — Other Ambulatory Visit: Payer: Self-pay | Admitting: Cardiovascular Disease

## 2022-11-01 LAB — HM DIABETES EYE EXAM

## 2022-12-04 ENCOUNTER — Encounter (HOSPITAL_BASED_OUTPATIENT_CLINIC_OR_DEPARTMENT_OTHER): Payer: Medicare Other | Attending: Internal Medicine | Admitting: Internal Medicine

## 2022-12-04 DIAGNOSIS — N3040 Irradiation cystitis without hematuria: Secondary | ICD-10-CM | POA: Insufficient documentation

## 2022-12-04 DIAGNOSIS — C61 Malignant neoplasm of prostate: Secondary | ICD-10-CM | POA: Diagnosis not present

## 2022-12-04 DIAGNOSIS — Y842 Radiological procedure and radiotherapy as the cause of abnormal reaction of the patient, or of later complication, without mention of misadventure at the time of the procedure: Secondary | ICD-10-CM | POA: Diagnosis not present

## 2022-12-04 NOTE — Progress Notes (Signed)
Wound Cleansing / Measurement []  - 0 Simple Wound Cleansing - one wound []  - 0 Complex Wound Cleansing - multiple wounds []  - 0 Wound Imaging (photographs - any number of wounds) []  - 0 Wound Tracing (instead of photographs) []  - 0 Simple Wound Measurement - one wound []  - 0 Complex Wound Measurement - multiple wounds Jose [name] ([id number]) [phone].pdf Page 3 of 7 INTERVENTIONS - Wound Dressings []  - 0 Small Wound Dressing one or multiple wounds []  - 0 Medium Wound Dressing one or multiple wounds []  - 0 Large Wound Dressing one or multiple wounds []  - 0 Application of Medications - topical []  - 0 Application of Medications - injection INTERVENTIONS - Miscellaneous []  - 0 External ear exam []  - 0 Specimen Collection (cultures, biopsies, blood, body fluids, etc.) []  - 0 Specimen(s) / Culture(s) sent or taken to Lab for analysis []  - 0 Patient Transfer (multiple staff / Nurse, adult / Similar devices) []  - 0 Simple Staple / Suture removal (25 or less) []  - 0 Complex Staple / Suture removal (26 or more) []  - 0 Hypo / Hyperglycemic Management (close monitor of Blood Glucose) []  - 0 Ankle / Brachial Index (ABI) - do not check if billed separately X- 1 5 Vital Signs Has the patient been seen at the hospital within the last three years: Yes Total Score: 80 Level Of Care: New/Established - Level 3 Electronic Signature(s) Signed: 12/04/2022 3:27:15 PM By: Fonnie Mu RN Entered By: Fonnie Mu on 12/04/2022  08:18:22 -------------------------------------------------------------------------------- Encounter Discharge Information Details Patient Name: Date of Service: Jose [name]. 12/04/2022 10:30 A M Medical Record Number: [id number] Patient Account Number: [id number] Date of Birth/Sex: Treating RN: Nov 15, 1950 (72 y.o. Jose [name] Primary Care Jerran Tappan: [name] Other Clinician: Referring Makhya Arave: Treating Cipriana Biller/Extender: [name] in Treatment: 0 Encounter Discharge Information Items Discharge Condition: Stable Ambulatory Status: Ambulatory Discharge Destination: Home Transportation: Private Auto Accompanied By: wife Schedule Follow-up Appointment: Yes Clinical Summary of Care: Patient Declined Electronic Signature(s) Signed: 12/04/2022 3:27:15 PM By: Fonnie Mu RN Entered By: Fonnie Mu on 12/04/2022 08:33:18 Jose [name] ([id number]) [id number] Page 4 of 7 -------------------------------------------------------------------------------- Lower Extremity Assessment Details Patient Name: Date of Service: Jose, [name] 12/04/2022 10:30 A M Medical Record Number: [id number] Patient Account Number: [id number] Date of Birth/Sex: Treating RN: [birth year] (72 y.o. Jose [name] Primary Care Keymon Mcelroy: [name] Other Clinician: Referring Starlene Consuegra: Treating Makaiah Terwilliger/Extender: Leane Call , Minerva Ends Weeks in Treatment: 0 Notes NO LE WOUNDS Electronic Signature(s) Signed: 12/04/2022 3:27:15 PM By: Fonnie Mu RN Entered By: Fonnie Mu on 12/04/2022 07:38:24 -------------------------------------------------------------------------------- Multi Wound Chart Details Patient Name: Date of Service: Jose [name]. 12/04/2022 10:30 A M Medical Record Number: [id number] Patient Account Number: [id number] Date of Birth/Sex: Treating RN: April 15, 1950 (72 y.o.  M) Primary Care Carlyon Nolasco: [name] Other Clinician: Referring Emilyanne Mcgough: Treating Festus Pursel/Extender: Leane Call , Jacquelin Hawking in Treatment: 0 Vital Signs Height(in): 66 Capillary Blood Glucose(mg/dl): 433 Weight(lbs): 295.1 Pulse(bpm): 69 Body Mass Index(BMI): 29.8 Blood Pressure(mmHg): 174/77 Temperature(F): 98.4 Respiratory Rate(breaths/min): 17 [Treatment Notes:Wound Assessments Treatment Notes] Electronic Signature(s) Signed: 12/04/2022 4:26:34 PM By: Baltazar Najjar MD Entered By: Baltazar Najjar on 12/04/2022 09:43:35 -------------------------------------------------------------------------------- Multi-Disciplinary Care Plan Details Patient Name: Date of Service: Jose [name]. 12/04/2022 10:30 A M Medical Record Number: [id number] Patient Account Number: [id number] Date of Birth/Sex: Treating RN: [birth year] (72 y.o. Jose [name] Primary Care Anise Harbin: [name] Other Clinician:  Referring Amarri Satterly: Treating Jerzey Komperda/Extender: [name] in Treatment: 0 Active Inactive HBO Nursing Diagnoses: Anxiety related to feelings of confinement associated with the hyperbaric oxygen chamber Jose [name] ([id number]) [id number] Page 5 of 7 Anxiety related to knowledge deficit of hyperbaric oxygen therapy and treatment procedures Discomfort related to temperature and humidity changes inside hyperbaric chamber Potential for barotraumas to ears, sinuses, teeth, and lungs or cerebral gas embolism related to changes in atmospheric pressure inside hyperbaric oxygen chamber Potential for oxygen toxicity seizures related to delivery of 100% oxygen at an increased atmospheric pressure Potential for pulmonary oxygen toxicity related to delivery of 100% oxygen at an increased atmospheric pressure Goals: Barotrauma will be prevented during HBO2 Date Initiated: 12/04/2022 T arget Resolution Date:  01/10/2023 Goal Status: Active Patient and/or family will be able to state/discuss factors appropriate to the management of their disease process during treatment Date Initiated: 12/04/2022 T arget Resolution Date: 01/10/2023 Goal Status: Active Patient will tolerate the hyperbaric oxygen therapy treatment Date Initiated: 12/04/2022 T arget Resolution Date: 01/10/2023 Goal Status: Active Patient will tolerate the internal climate of the chamber Date Initiated: 12/04/2022 T arget Resolution Date: 01/09/2023 Goal Status: Active Interventions: Administer a five (5) minute air break for patient if signs and symptoms of seizure appear and notify the hyperbaric physician Administer a ten (10) minute air break for patient if signs and symptoms of seizure appear and notify the hyperbaric physician Administer decongestants, per physician orders, prior to HBO2 Administer the correct therapeutic gas delivery based on the patients needs and limitations, per physician order Assess and provide for patients comfort related to the hyperbaric environment and equalization of middle ear Assess for signs and symptoms related to adverse events, including but not limited to confinement anxiety, pneumothorax, oxygen toxicity and baurotrauma Notes: Orientation to the Wound Care Program Nursing Diagnoses: Knowledge deficit related to the wound healing center program Goals: Patient/caregiver will verbalize understanding of the Wound Healing Center Program Date Initiated: 12/04/2022 Target Resolution Date: 01/10/2023 Goal Status: Active Interventions: Provide education on orientation to the wound center Notes: Electronic Signature(s) Signed: 12/04/2022 3:27:15 PM By: Fonnie Mu RN Entered By: Fonnie Mu on 12/04/2022 07:46:24 -------------------------------------------------------------------------------- Pain Assessment Details Patient Name: Date of Service: Jose [name]. 12/04/2022 10:30 A  M Medical Record Number: [id number] Patient Account Number: [id number] Date of Birth/Sex: Treating RN: January 24, 1951 (72 y.o. Jose [name] Primary Care Kenzly Rogoff: [name] Other Clinician: Referring Hadyn Azer: Treating Jerrol Helmers/Extender: [name] in Treatment: 0 Active Problems Location of Pain Severity and Description of Pain Patient Has Paino No Site Locations Jose [name] ([id number]) [phone].pdf Page 6 of 7 Pain Management and Medication Current Pain Management: Electronic Signature(s) Signed: 12/04/2022 3:27:15 PM By: Fonnie Mu RN Entered By: Fonnie Mu on 12/04/2022 07:38:34 -------------------------------------------------------------------------------- Patient/Caregiver Education Details Patient Name: Date of Service: Jose [name]. 10/1/2024andnbsp10:30 A M Medical Record Number: [id number] Patient Account Number: [id number] Date of Birth/Gender: Treating RN: Nov 20, 1950 (72 y.o. Jose [name] Primary Care Physician: [name] Other Clinician: Referring Physician: Treating Physician/Extender: [name] in Treatment: 0 Education Assessment Education Provided To: Patient and Caregiver Education Topics Provided Hyperbaric Oxygenation: Methods: Explain/Verbal Responses: Reinforcements needed, State content correctly Wound/Skin Impairment: Electronic Signature(s) Signed: 12/04/2022 3:27:15 PM By: Fonnie Mu RN Entered By: Fonnie Mu on 12/04/2022 07:50:48 -------------------------------------------------------------------------------- Vitals Details Patient Name: Date of Service: Jose [name]. 12/04/2022 10:30 A M Medical Record Number: [id number] Patient Account Number:  [id number] Date of Birth/Sex: Treating RN: 1950/12/28 (72 y.o. Jose [name] Primary Care Zaeden Lastinger: [name] Other Clinician: THI, Jose [name]  ([id number]) [id number] Page 7 of 7 Referring Yaritzy Huser: Treating Cairo Agostinelli/Extender: Leane Call , Jacquelin Hawking in Treatment: 0 Vital Signs Time Taken: 10:34 Temperature (F): 98.4 Height (in): 66 Pulse (bpm): 69 Source: Stated Respiratory Rate (breaths/min): 17 Weight (lbs): 184.6 Blood Pressure (mmHg): 174/77 Source: Stated Capillary Blood Glucose (mg/dl): 366 Body Mass Index (BMI): 29.8 Reference Range: 80 - 120 mg / dl Electronic Signature(s) Signed: 12/04/2022 3:27:15 PM By: Fonnie Mu RN Entered By: Fonnie Mu on 12/04/2022 07:34:53  [id number] Date of Birth/Sex: Treating RN: 1950/12/28 (72 y.o. Jose [name] Primary Care Zaeden Lastinger: [name] Other Clinician: THI, Jose [name]  ([id number]) [id number] Page 7 of 7 Referring Yaritzy Huser: Treating Cairo Agostinelli/Extender: Leane Call , Jacquelin Hawking in Treatment: 0 Vital Signs Time Taken: 10:34 Temperature (F): 98.4 Height (in): 66 Pulse (bpm): 69 Source: Stated Respiratory Rate (breaths/min): 17 Weight (lbs): 184.6 Blood Pressure (mmHg): 174/77 Source: Stated Capillary Blood Glucose (mg/dl): 366 Body Mass Index (BMI): 29.8 Reference Range: 80 - 120 mg / dl Electronic Signature(s) Signed: 12/04/2022 3:27:15 PM By: Fonnie Mu RN Entered By: Fonnie Mu on 12/04/2022 07:34:53

## 2022-12-04 NOTE — Progress Notes (Signed)
Jose, MEIDINGER [name] ([id number]) [id number] Page 1 of 4 Visit Report for 12/04/2022 Abuse Risk Screen Details Patient Name: Date of Service: Jose [name], Jose MES [name]. 12/04/2022 10:30 A M Medical Record Number: [id number] Patient Account Number: [id number] Date of Birth/Sex: Treating RN: 04/06/1950 (72 y.o. [name], Lauren Primary Care Rocko Fesperman: [name] Other Clinician: Referring Brycen Bean: Treating Merit Gadsby/Extender: [name] in Treatment: 0 Abuse Risk Screen Items Answer ABUSE RISK SCREEN: Has anyone close to you tried to hurt or harm you recentlyo No Do you feel uncomfortable with anyone in your familyo No Has anyone forced you do things that you didnt want to doo No Electronic Signature(s) Signed: 12/04/2022 3:27:15 PM By: Fonnie Mu RN Entered By: Fonnie Mu on 12/04/2022 10:37:05 -------------------------------------------------------------------------------- Activities of Daily Living Details Patient Name: Date of Service: Jose, KLEM MES [name]. 12/04/2022 10:30 A M Medical Record Number: [id number] Patient Account Number: [id number] Date of Birth/Sex: Treating RN: 04-12-1950 (72 y.o. [name], Lauren Primary Care Thamara Leger: [name] Other Clinician: Referring Imran Nuon: Treating Xeng Kucher/Extender: [name] in Treatment: 0 Activities of Daily Living Items Answer Activities of Daily Living (Please select one for each item) Drive Automobile Completely Able T Medications ake Completely Able Use T elephone Completely Able Care for Appearance Completely Able Use T oilet Completely Able Bath / Shower Completely Able Dress Self Completely Able Feed Self Completely Able Walk Completely Able Get In / Out Bed Completely Able Housework Completely Able Prepare Meals Completely Able Handle Money Completely Able Shop for Self Completely Able Electronic  Signature(s) Signed: 12/04/2022 3:27:15 PM By: Fonnie Mu RN Entered By: Fonnie Mu on 12/04/2022 10:37:22 [name] ([id number]) [id number] Page 2 of 4 -------------------------------------------------------------------------------- Education Screening Details Patient Name: Date of Service: Jose, INGLETT MES [name]. 12/04/2022 10:30 A M Medical Record Number: [id number] Patient Account Number: [id number] Date of Birth/Sex: Treating RN: [birth year] (72 y.o. [name], Lauren Primary Care Aristotelis Vilardi: [name] Other Clinician: Referring Kraven Calk: Treating Rayana Geurin/Extender: [name] in Treatment: 0 Primary Learner Assessed: Patient Learning Preferences/Education Level/Primary Language Learning Preference: Explanation, Demonstration, Communication Board, Printed Material Highest Education Level: College or Above Preferred Language: English Cognitive Barrier Language Barrier: No Translator Needed: No Memory Deficit: No Emotional Barrier: No Cultural/Religious Beliefs Affecting Medical Care: No Physical Barrier Impaired Vision: Yes Contacts Impaired Hearing: Yes Hearing Aid, bilateral Decreased Hand dexterity: No Knowledge/Comprehension Knowledge Level: High Comprehension Level: High Ability to understand written instructions: High Ability to understand verbal instructions: High Motivation Anxiety Level: Calm Cooperation: Cooperative Education Importance: Denies Need Interest in Health Problems: Asks Questions Perception: Coherent Willingness to Engage in Self-Management High Activities: Readiness to Engage in Self-Management High Activities: Electronic Signature(s) Signed: 12/04/2022 3:27:15 PM By: Fonnie Mu RN Entered By: Fonnie Mu on 12/04/2022 10:37:59 -------------------------------------------------------------------------------- Fall Risk Assessment Details Patient Name:  Date of Service: Jose Sousa MES [name]. 12/04/2022 10:30 A M Medical Record Number: [id number] Patient Account Number: [id number] Date of Birth/Sex: Treating RN: [birth year] (72 y.o. [name] Primary Care [name]: [name] Other Clinician: Referring [name]: Treating [name]/Extender: [name] in Treatment: 0 Fall Risk Assessment Items Have you had 2 or more falls in the last [name] 0 No AKSH, SWART [name] ([id number]) [phone] [id number] Page 3 of 4 Have you had any fall that resulted in injury in the last 12 monthso 0 No FALLS RISK SCREEN History of falling - immediate or within 3 months 0  No Secondary diagnosis (Do you have 2 or more medical diagnoseso) 0 No Ambulatory aid None/bed rest/wheelchair/nurse 0 No Crutches/cane/walker 0 No Furniture 0 No Intravenous therapy Access/Saline/Heparin Lock 0 No Gait/Transferring Normal/ bed rest/ wheelchair 0 No Weak (short steps with or without shuffle, stooped but able to lift head while walking, may seek 0 No support from furniture) Impaired (short steps with shuffle, may have difficulty arising from chair, head down, impaired 0 No balance) Mental Status Oriented to own ability 0 No Electronic Signature(s) Signed: 12/04/2022 3:27:15 PM By: Fonnie Mu RN Entered By: Fonnie Mu on 12/04/2022 10:38:04 -------------------------------------------------------------------------------- Foot Assessment Details Patient Name: Date of Service: Jose Sousa MES [name]. 12/04/2022 10:30 A M Medical Record Number: [id number] Patient Account Number: [id number] Date of Birth/Sex: Treating RN: [birth year] (72 y.o. [name], Lauren Primary Care Abdulrahim Siddiqi: [name] Other Clinician: Referring [name]: Treating [name]/Extender: [name] in Treatment: 0 Foot Assessment Items Site Locations + = Sensation present, - = Sensation  absent, C = Callus, U = Ulcer R = Redness, W = Warmth, M = Maceration, PU = Pre-ulcerative lesion F = Fissure, S = Swelling, [name] = Dryness Assessment Right: Left: Other Deformity: No No Prior Foot Ulcer: No No Prior Amputation: No No Charcot Joint: No No Ambulatory Status: GaitCOLONEL, [name] ([id number]) [phone] [id number] Page 4 of 4 Notes NO LE WOUNDS Electronic Signature(s) Signed: 12/04/2022 3:27:15 PM By: Fonnie Mu RN Entered By: Fonnie Mu on 12/04/2022 10:38:17 -------------------------------------------------------------------------------- Nutrition Risk Screening Details Patient Name: Date of Service: Jose Sousa MES [name]. 12/04/2022 10:30 A M Medical Record Number: [id number] Patient Account Number: [id number] Date of Birth/Sex: Treating RN: 06-21-1950 (73 y.o. [name], Lauren Primary Care Mathea Frieling: [name] Other Clinician: Referring Venecia Mehl: Treating Azzan Butler/Extender: Leane Call , Jacquelin Hawking in Treatment: 0 Height (in): 66 Weight (lbs): 184.6 Body Mass Index (BMI): 29.8 Nutrition Risk Screening Items Score Screening NUTRITION RISK SCREEN: I have an illness or condition that made me change the kind and/or amount of food I eat 0 No I eat fewer than two meals per day 0 No I eat few fruits and vegetables, or milk products 0 No I have three or more drinks of beer, liquor or wine almost every day 0 No I have tooth or mouth problems that make it hard for me to eat 0 No I don't always have enough money to buy the food I need 0 No I eat alone most of the time 0 No I take three or more different prescribed or over-the-counter drugs a day 0 No Without wanting to, I have lost or gained 10 pounds in the last six months 0 No I am not always physically able to shop, cook and/or feed myself 0 No Nutrition Protocols Good Risk Protocol 0 No interventions needed Moderate Risk Protocol High Risk Proctocol Risk  Level: Good Risk Score: 0 Electronic Signature(s) Signed: 12/04/2022 3:27:15 PM By: Fonnie Mu RN Entered By: Fonnie Mu on 12/04/2022 10:38:09

## 2022-12-06 ENCOUNTER — Other Ambulatory Visit: Payer: Self-pay | Admitting: Internal Medicine

## 2022-12-11 ENCOUNTER — Encounter (HOSPITAL_BASED_OUTPATIENT_CLINIC_OR_DEPARTMENT_OTHER): Payer: Self-pay

## 2022-12-12 ENCOUNTER — Other Ambulatory Visit (HOSPITAL_COMMUNITY): Payer: Self-pay | Admitting: Internal Medicine

## 2022-12-12 ENCOUNTER — Ambulatory Visit (HOSPITAL_COMMUNITY)
Admission: RE | Admit: 2022-12-12 | Discharge: 2022-12-12 | Disposition: A | Discharge status: Home / Self Care | Payer: Medicare Other | Source: Ambulatory Visit | Attending: Internal Medicine | Admitting: Internal Medicine

## 2022-12-12 DIAGNOSIS — I10 Essential (primary) hypertension: Secondary | ICD-10-CM | POA: Diagnosis present

## 2022-12-14 ENCOUNTER — Other Ambulatory Visit: Payer: Self-pay | Admitting: Internal Medicine

## 2022-12-17 ENCOUNTER — Encounter (HOSPITAL_BASED_OUTPATIENT_CLINIC_OR_DEPARTMENT_OTHER): Payer: Medicare Other | Admitting: Internal Medicine

## 2022-12-17 DIAGNOSIS — C61 Malignant neoplasm of prostate: Secondary | ICD-10-CM | POA: Diagnosis not present

## 2022-12-17 LAB — GLUCOSE, CAPILLARY
Glucose-Capillary: 124 mg/dL — ABNORMAL HIGH (ref 70–99)
Glucose-Capillary: 118 mg/dL — ABNORMAL HIGH (ref 70–99)

## 2022-12-17 NOTE — Progress Notes (Signed)
JOHNDAVID, GERALDS D ([id number]) [id number] Page 1 of 2 Visit Report for 12/17/2022 Arrival Information Details Patient Name: Date of Service: WELBORN, KEENA MES D. 12/17/2022 12:00 PM Medical Record Number: [id number] Patient Account Number: [id number] Date of Birth/Sex: Treating RN: [birth year] (72 y.o. [name], [name] Primary Care Shaena Parkerson: [name] Other Clinician: Karl Bales Referring Varsha Knock: Treating Lyndsey Demos/Extender: [name] in Treatment: 1 Visit Information History Since Last Visit All ordered tests and consults were completed: Yes Patient Arrived: Ambulatory Added or deleted any medications: No Arrival Time: 11:18 Any new allergies or adverse reactions: No Accompanied By: Wife Had a fall or experienced change in No Transfer Assistance: None activities of daily living that may affect Patient Identification Verified: Yes risk of falls: Secondary Verification Process Completed: Yes Signs or symptoms of abuse/neglect since last visito No Patient Requires Transmission-Based Precautions: No Hospitalized since last visit: No Patient Has Alerts: No Implantable device outside of the clinic excluding No cellular tissue based products placed in the center since last visit: Pain Present Now: No Electronic Signature(s) Signed: 12/17/2022 4:08:41 PM By: Karl Bales EMT Entered By: Karl Bales on 12/17/2022 13:08:40 -------------------------------------------------------------------------------- Encounter Discharge Information Details Patient Name: Date of Service: [name] 12/17/2022 12:00 PM Medical Record Number: [id number] Patient Account Number: [id number] Date of Birth/Sex: Treating RN: Jun 29, 1950 (72 y.o. [name] Primary Care Urias Sheek: [name] Other Clinician: Karl Bales Referring Shawnique Mariotti: Treating Dawanda Mapel/Extender: [name] in  Treatment: 1 Encounter Discharge Information Items Discharge Condition: Stable Ambulatory Status: Ambulatory Discharge Destination: Home Transportation: Private Auto Accompanied By: Wife Schedule Follow-up Appointment: Yes Clinical Summary of Care: Electronic Signature(s) Signed: 12/17/2022 4:27:40 PM By: Karl Bales EMT Entered By: Karl Bales on 12/17/2022 13:27:40 Marvis Repress D ([id number]) [phone] [id number].pdf Page 2 of 2 -------------------------------------------------------------------------------- Vitals Details Patient Name: Date of Service: ESLEY, BROOKING MES D. 12/17/2022 12:00 PM Medical Record Number: [id number] Patient Account Number: [id number] Date of Birth/Sex: Treating RN: 1950/11/22 (72 y.o. [name] Primary Care Tane Biegler: [name] Other Clinician: Karl Bales Referring Oluwateniola Leitch: Treating Rubee Vega/Extender: [name] in Treatment: 1 Vital Signs Time Taken: 11:26 Temperature (F): 98.8 Height (in): 66 Pulse (bpm): 71 Weight (lbs): 184.6 Respiratory Rate (breaths/min): 18 Body Mass Index (BMI): 29.8 Blood Pressure (mmHg): 200/89 Capillary Blood Glucose (mg/dl): 630 Reference Range: 80 - 120 mg / dl Electronic Signature(s) Signed: 12/17/2022 4:18:24 PM By: Karl Bales EMT Entered By: Karl Bales on 12/17/2022 13:18:24

## 2022-12-17 NOTE — Progress Notes (Addendum)
RAMBO, SARAFIAN ([id number]) [id number] Page 1 of 2 Visit Report for 12/17/2022 Problem List Details Patient Name: Date of Service: Jose [name], [name] 12/17/2022 12:00 PM Medical Record Number: [id number] Patient Account Number: [id number] Date of Birth/Sex: Treating RN: 1950/08/09 (72 y.o. 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 12/17/2022 Medical Record Number: [id number] Patient Account Number: [id number] Date of Birth/Sex: Treating RN: 15-Dec-1950 (72 y.o. 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 R35.0 Frequency of micturition [name], [name] ([id number]) [phone].pdf Page 2 of 2 R30.0 Dysuria C61 Malignant neoplasm of prostate Facility Procedures : CPT4 Code Description: [id number]-(Facility Use Only) HBOT full body chamber, , ICD-10 Diagnosis Description N30.40 Irradiation cystitis  without hematuria Modifier: Quantity: 4 Physician Procedures : CPT4 Code Description Modifier [id number] - WC PHYS HYPERBARIC OXYGEN THERAPY ICD-10 Diagnosis Description 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

## 2022-12-17 NOTE — Progress Notes (Addendum)
NEWT, APODACA D ([id number]) [id number] Page 1 of 2 Visit Report for 12/17/2022 HBO Details Patient Name: Date of Service: Jose [name], [name] 12/17/2022 12:00 PM Medical Record Number: [id number] Patient Account Number: [id number] Date of Birth/Sex: Treating RN: 09/18/1950 (72 y.o. Jose [name] Primary Care Meridee Branum: [name] Other Clinician: Karl Bales Referring Liller Yohn: Treating Cyntha Brickman/Extender: [name] in Treatment: 1 HBO Treatment Course Details Treatment Course Number: 1 Ordering Brittne Kawasaki: Baltazar Najjar T Treatments Ordered: otal 40 HBO Treatment Start Date: 12/17/2022 HBO Indication: Late Effect of Radiation HBO Treatment Details Treatment Number: 1 Patient Type: Outpatient Chamber Type: Monoplace Chamber Serial #: [id number] Treatment Protocol: 2.0 ATA with 90 minutes oxygen, and no air breaks Treatment Details Compression Rate Down: 1.0 psi / minute De-Compression Rate Up: 1.0 psi / minute Air breaks and breathing Decompress Decompress Compress Tx Pressure Begins Reached periods Begins Ends (leave unused spaces blank) Chamber Pressure (ATA 1 2 ------2 1 ) Clock Time (24 hr) 12:09 12:29 - - - - - - 15:59 14:11 Treatment Length: 122 (minutes) Treatment Segments: 4 Vital Signs Capillary Blood Glucose Reference Range: 80 - 120 mg / dl HBO Diabetic Blood Glucose Intervention Range: <131 mg/dl or >027 mg/dl Type: Time Vitals Blood Pulse: Respiratory Capillary Blood Glucose Pulse Action Temperature: Taken: Pressure: Rate: Glucose (mg/dl): Meter #: Oximetry (%) Taken: Pre 11:26 200/89 71 18 98.8 124 Patient given 8 oz glucerna Post 14:16 203/88 65 18 97.5 118 Treatment Response Treatment Toleration: Well Treatment Completion Status: Treatment Completed without Adverse Event Treatment Notes Today was the first treatment. On arrival the patient blood sugar was 124. The patient was given 8  oz of Glucerna to drink. The patient was seen by Dr. Leanord Hawking before starting treatment. No problems noted during treatment. Taesean Reth Notes Patient was a bit apprehensive prior to first treatment however he ultimately tolerated quite well. Pretreatment exam showed normal carb cardiopulmonary exams. Thematic tympanic membranes look normal Physician HBO Attestation: I certify that I supervised this HBO treatment in accordance with Medicare guidelines. A trained emergency response team is readily available per Yes hospital policies and procedures. Continue HBOT as ordered. Yes Electronic Signature(s) Signed: 12/17/2022 4:41:28 PM By: Baltazar Najjar MD Marvis Repress D ([id number]) [id number] Page 2 of 2 Signed: 12/17/2022 4:41:28 PM By: Baltazar Najjar MD Previous Signature: 12/17/2022 4:23:53 PM Version By: Karl Bales EMT Entered By: Baltazar Najjar on 12/17/2022 16:40:07 -------------------------------------------------------------------------------- HBO Safety Checklist Details Patient Name: Date of Service: Jose [name] MES D. 12/17/2022 12:00 PM Medical Record Number: [id number] Patient Account Number: [id number] Date of Birth/Sex: Treating RN: 1950-04-24 (72 y.o. Jose [name] Primary Care Latrese Carolan: [name] Other Clinician: Karl Bales Referring Aubreyana Saltz: Treating Payam Gribble/Extender: [name] in Treatment: 1 HBO Safety Checklist Items Safety Checklist Consent Form Signed Patient voided / foley secured and emptied When did you last eato 0830 Last dose of injectable or oral agent NA Ostomy pouch emptied and vented if applicable NA All implantable devices assessed, documented and approved NA Intravenous access site secured and place NA Valuables secured Linens and cotton and cotton/polyester blend (less than 51% polyester) Personal oil-based products / skin lotions / body lotions removed Wigs or hairpieces  removed NA Smoking or tobacco materials removed Books / newspapers / magazines / loose paper removed Cologne, aftershave, perfume and deodorant removed Jewelry removed (may wrap wedding band) Make-up removed NA Hair care products removed Battery operated devices (external) removed Heating patches and chemical warmers removed Titanium  eyewear removed NA Nail polish cured greater than 10 hours NA Casting material cured greater than 10 hours NA Hearing aids removed NA Loose dentures or partials removed NA Prosthetics have been removed NA Patient demonstrates correct use of air break device (if applicable) Patient concerns have been addressed Patient grounding bracelet on and cord attached to chamber Specifics for Inpatients (complete in addition to above) Medication sheet sent with patient NA Intravenous medications needed or due during therapy sent with patient NA Drainage tubes (e.g. nasogastric tube or chest tube secured and vented) NA Endotracheal or Tracheotomy tube secured NA Cuff deflated of air and inflated with saline NA Airway suctioned NA Notes safety checklist was done before the treatment was started. Electronic Signature(s) Signed: 12/17/2022 4:19:32 PM By: Karl Bales EMT Entered By: Karl Bales on 12/17/2022 16:19:31

## 2022-12-18 ENCOUNTER — Encounter (HOSPITAL_BASED_OUTPATIENT_CLINIC_OR_DEPARTMENT_OTHER): Payer: Medicare Other | Admitting: Internal Medicine

## 2022-12-18 DIAGNOSIS — C61 Malignant neoplasm of prostate: Secondary | ICD-10-CM | POA: Diagnosis not present

## 2022-12-18 LAB — GLUCOSE, CAPILLARY
Glucose-Capillary: 65 mg/dL — ABNORMAL LOW (ref 70–99)
Glucose-Capillary: 104 mg/dL — ABNORMAL HIGH (ref 70–99)

## 2022-12-18 NOTE — Progress Notes (Signed)
FERREL, SIMINGTON ([id number]) [id number] Page 1 of 1 Visit Report for 12/18/2022 SuperBill Details Patient Name: Date of Service: CALLAGHAN, LAVERDURE MES D. 12/18/2022 Medical Record Number: [id number] Patient Account Number: [id number] Date of Birth/Sex: Treating RN: December 30, 1950 (72 y.o. 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

## 2022-12-18 NOTE — Progress Notes (Signed)
LAQUINCY, EASTRIDGE D ([id number]) [id number] Page 1 of 4 Visit Report for 12/18/2022 Arrival Information Details Patient Name: Date of Service: Jose [name], [name] 12/18/2022 12:00 PM Medical Record Number: [id number] Patient Account Number: [id number] Date of Birth/Sex: Treating RN: [birth year] (72 y.o. Jose [name], Lauren Primary Care Othniel Maret: [name] Other Clinician: Referring Laurel Smeltz: Treating Brogen Duell/Extender: [name] in Treatment: 2 Visit Information History Since Last Visit Added or deleted any medications: No Patient Arrived: Ambulatory Any new allergies or adverse reactions: No Arrival Time: 11:26 Had a fall or experienced change in No Accompanied By: wife activities of daily living that may affect Transfer Assistance: None risk of falls: Patient Identification Verified: Yes Signs or symptoms of abuse/neglect since last visito No Secondary Verification Process Completed: Yes Hospitalized since last visit: No Patient Requires Transmission-Based Precautions: No Implantable device outside of the clinic excluding No Patient Has Alerts: No cellular tissue based products placed in the center since last visit: Pain Present Now: No Electronic Signature(s) Signed: 12/18/2022 12:25:41 PM By: Fonnie Mu RN Entered By: Fonnie Mu on 12/18/2022 09:25:41 -------------------------------------------------------------------------------- Clinic Level of Care Assessment Details Patient Name: Date of Service: Jose [name] MES D. 12/18/2022 12:00 PM Medical Record Number: [id number] Patient Account Number: [id number] Date of Birth/Sex: Treating RN: 02/28/1951 (72 y.o. Jose [name], Lauren Primary Care Delorse Shane: [name] Other Clinician: Referring Anira Senegal: Treating Evlyn Amason/Extender: [name] in Treatment: 2 Clinic Level of Care Assessment Items TOOL 4 Quantity Score X- 1  0 Use when only an EandM is performed on FOLLOW-UP visit ASSESSMENTS - Nursing Assessment / Reassessment X- 1 10 Reassessment of Co-morbidities (includes updates in patient status) X- 1 5 Reassessment of Adherence to Treatment Plan ASSESSMENTS - Wound and Skin A ssessment / Reassessment []  - 0 Simple Wound Assessment / Reassessment - one wound []  - 0 Complex Wound Assessment / Reassessment - multiple wounds []  - 0 Dermatologic / Skin Assessment (not related to wound area) ASSESSMENTS - Focused Assessment []  - 0 Circumferential Edema Measurements - multi extremities X- 1 10 Nutritional Assessment / Counseling / Intervention []  - 0 Lower Extremity Assessment (monofilament, tuning fork, pulses) NHAT, HEARNE D ([id number]) [id number] Page 2 of 4 []  - 0 Peripheral Arterial Disease Assessment (using hand held doppler) ASSESSMENTS - Ostomy and/or Continence Assessment and Care []  - 0 Incontinence Assessment and Management []  - 0 Ostomy Care Assessment and Management (repouching, etc.) PROCESS - Coordination of Care X - Simple Patient / Family Education for ongoing care 1 15 []  - 0 Complex (extensive) Patient / Family Education for ongoing care X- 1 10 Staff obtains Chiropractor, Records, T Results / Process Orders est []  - 0 Staff telephones HHA, Nursing Homes / Clarify orders / etc []  - 0 Routine Transfer to another Facility (non-emergent condition) []  - 0 Routine Hospital Admission (non-emergent condition) []  - 0 New Admissions / Manufacturing engineer / Ordering NPWT Apligraf, etc. , []  - 0 Emergency Hospital Admission (emergent condition) X- 1 10 Simple Discharge Coordination []  - 0 Complex (extensive) Discharge Coordination PROCESS - Special Needs []  - 0 Pediatric / Minor Patient Management []  - 0 Isolation Patient Management []  - 0 Hearing / Language / Visual special needs []  - 0 Assessment of Community assistance (transportation, D/C  planning, etc.) []  - 0 Additional assistance / Altered mentation []  - 0 Support Surface(s) Assessment (bed, cushion, seat, etc.) INTERVENTIONS - Wound Cleansing / Measurement []  - 0 Simple Wound Cleansing - one wound []  -  LAQUINCY, EASTRIDGE D ([id number]) [id number] Page 1 of 4 Visit Report for 12/18/2022 Arrival Information Details Patient Name: Date of Service: Jose [name], [name] 12/18/2022 12:00 PM Medical Record Number: [id number] Patient Account Number: [id number] Date of Birth/Sex: Treating RN: [birth year] (72 y.o. Jose [name], Lauren Primary Care Othniel Maret: [name] Other Clinician: Referring Laurel Smeltz: Treating Brogen Duell/Extender: [name] in Treatment: 2 Visit Information History Since Last Visit Added or deleted any medications: No Patient Arrived: Ambulatory Any new allergies or adverse reactions: No Arrival Time: 11:26 Had a fall or experienced change in No Accompanied By: wife activities of daily living that may affect Transfer Assistance: None risk of falls: Patient Identification Verified: Yes Signs or symptoms of abuse/neglect since last visito No Secondary Verification Process Completed: Yes Hospitalized since last visit: No Patient Requires Transmission-Based Precautions: No Implantable device outside of the clinic excluding No Patient Has Alerts: No cellular tissue based products placed in the center since last visit: Pain Present Now: No Electronic Signature(s) Signed: 12/18/2022 12:25:41 PM By: Fonnie Mu RN Entered By: Fonnie Mu on 12/18/2022 09:25:41 -------------------------------------------------------------------------------- Clinic Level of Care Assessment Details Patient Name: Date of Service: Jose [name] MES D. 12/18/2022 12:00 PM Medical Record Number: [id number] Patient Account Number: [id number] Date of Birth/Sex: Treating RN: 02/28/1951 (72 y.o. Jose [name], Lauren Primary Care Delorse Shane: [name] Other Clinician: Referring Anira Senegal: Treating Evlyn Amason/Extender: [name] in Treatment: 2 Clinic Level of Care Assessment Items TOOL 4 Quantity Score X- 1  0 Use when only an EandM is performed on FOLLOW-UP visit ASSESSMENTS - Nursing Assessment / Reassessment X- 1 10 Reassessment of Co-morbidities (includes updates in patient status) X- 1 5 Reassessment of Adherence to Treatment Plan ASSESSMENTS - Wound and Skin A ssessment / Reassessment []  - 0 Simple Wound Assessment / Reassessment - one wound []  - 0 Complex Wound Assessment / Reassessment - multiple wounds []  - 0 Dermatologic / Skin Assessment (not related to wound area) ASSESSMENTS - Focused Assessment []  - 0 Circumferential Edema Measurements - multi extremities X- 1 10 Nutritional Assessment / Counseling / Intervention []  - 0 Lower Extremity Assessment (monofilament, tuning fork, pulses) NHAT, HEARNE D ([id number]) [id number] Page 2 of 4 []  - 0 Peripheral Arterial Disease Assessment (using hand held doppler) ASSESSMENTS - Ostomy and/or Continence Assessment and Care []  - 0 Incontinence Assessment and Management []  - 0 Ostomy Care Assessment and Management (repouching, etc.) PROCESS - Coordination of Care X - Simple Patient / Family Education for ongoing care 1 15 []  - 0 Complex (extensive) Patient / Family Education for ongoing care X- 1 10 Staff obtains Chiropractor, Records, T Results / Process Orders est []  - 0 Staff telephones HHA, Nursing Homes / Clarify orders / etc []  - 0 Routine Transfer to another Facility (non-emergent condition) []  - 0 Routine Hospital Admission (non-emergent condition) []  - 0 New Admissions / Manufacturing engineer / Ordering NPWT Apligraf, etc. , []  - 0 Emergency Hospital Admission (emergent condition) X- 1 10 Simple Discharge Coordination []  - 0 Complex (extensive) Discharge Coordination PROCESS - Special Needs []  - 0 Pediatric / Minor Patient Management []  - 0 Isolation Patient Management []  - 0 Hearing / Language / Visual special needs []  - 0 Assessment of Community assistance (transportation, D/C  planning, etc.) []  - 0 Additional assistance / Altered mentation []  - 0 Support Surface(s) Assessment (bed, cushion, seat, etc.) INTERVENTIONS - Wound Cleansing / Measurement []  - 0 Simple Wound Cleansing - one wound []  -  0 Complex Wound Cleansing - multiple wounds []  - 0 Wound Imaging (photographs - any number of wounds) []  - 0 Wound Tracing (instead of photographs) []  - 0 Simple Wound Measurement - one wound []  - 0 Complex Wound Measurement - multiple wounds INTERVENTIONS - Wound Dressings []  - 0 Small Wound Dressing one or multiple wounds []  - 0 Medium Wound Dressing one or multiple wounds []  - 0 Large Wound Dressing one or multiple wounds []  - 0 Application of Medications - topical []  - 0 Application of Medications - injection INTERVENTIONS - Miscellaneous []  - 0 External ear exam []  - 0 Specimen Collection (cultures, biopsies, blood, body fluids, etc.) []  - 0 Specimen(s) / Culture(s) sent or taken to Lab for analysis []  - 0 Patient Transfer (multiple staff / Nurse, adult / Similar devices) []  - 0 Simple Staple / Suture removal (25 or less) []  - 0 Complex Staple / Suture removal (26 or more) []  - 0 Hypo / Hyperglycemic Management (close monitor of Blood Glucose) []  - 0 Ankle / Brachial Index (ABI) - do not check if billed separately TRENT, GABLER ([id number]) [phone] Page 3 of 4 X- 1 5 Vital Signs Has the patient been seen at the hospital within the last three years: Yes Total Score: 65 Level Of Care: New/Established - Level 2 Electronic Signature(s) Unsigned Entered By: Fonnie Mu on 12/18/2022 09:30:26 -------------------------------------------------------------------------------- Encounter Discharge Information Details Patient Name: Date of Service: ALLARD, LIGHTSEY MES D. 12/18/2022 12:00 PM Medical Record Number: [id number] Patient Account Number: [id number] Date of Birth/Sex: Treating RN: 13-Nov-1950 (72 y.o. Jose [name],  Lauren Primary Care Arnetia Bronk: [name] Other Clinician: Referring Sahaj Bona: Treating Cade Olberding/Extender: [name] in Treatment: 2 Encounter Discharge Information Items Discharge Condition: Stable Ambulatory Status: Ambulatory Discharge Destination: Home Transportation: Private Auto Accompanied By: wife Schedule Follow-up Appointment: Yes Clinical Summary of Care: Patient Declined Electronic Signature(s) Signed: 12/18/2022 12:29:30 PM By: Fonnie Mu RN Entered By: Fonnie Mu on 12/18/2022 09:29:30 -------------------------------------------------------------------------------- Patient/Caregiver Education Details Patient Name: Date of Service: Jose [name] MES D. 10/15/2024andnbsp12:00 PM Medical Record Number: [id number] Patient Account Number: [id number] Date of Birth/Gender: Treating RN: Oct 14, 1950 (72 y.o. [name] Primary Care Physician: [name] Other Clinician: Referring Physician: Treating Physician/Extender: [name] in Treatment: 2 Education Assessment Education Provided To: Patient and Caregiver Education Topics Provided Hyperbaric Oxygenation: Methods: Explain/Verbal Responses: Reinforcements needed, State content correctly Nutrition: Methods: Explain/Verbal Responses: Reinforcements needed, State content correctly JAEVON, PARAS ([id number]) [phone].pdf Page 4 of 4 Electronic Signature(s) Unsigned Entered By: Fonnie Mu on 12/18/2022 09:29:13 -------------------------------------------------------------------------------- Vitals Details Patient Name: Date of Service: JAHZION, BROGDEN MES D. 12/18/2022 12:00 PM Medical Record Number: [id number] Patient Account Number: [id number] Date of Birth/Sex: Treating RN: 07/26/1950 (72 y.o. Jose [name], Lauren Primary Care Haydin Dunn: [name] Other Clinician: Referring  Romario Tith: Treating Tucker Minter/Extender: [name] in Treatment: 2 Vital Signs Time Taken: 11:26 Temperature (F): 97.5 Height (in): 66 Pulse (bpm): 66 Weight (lbs): 184.6 Respiratory Rate (breaths/min): 17 Body Mass Index (BMI): 29.8 Blood Pressure (mmHg): 176/88 Capillary Blood Glucose (mg/dl): 65 Reference Range: 80 - 120 mg / dl Notes Dr. Leanord Hawking made aware of blood sugar; per protocol gave pt. orange juice and a glucerna. Waited 30 minutes to re-check sugar. Re-check sugar at 1156 was 104. Dr. Leanord Hawking made aware. Per Dr. Leanord Hawking pt. is to go home and not do HBO today. Educated pt. on s/s of hypoglycemia. Also educated pt. on

## 2022-12-19 ENCOUNTER — Encounter (HOSPITAL_BASED_OUTPATIENT_CLINIC_OR_DEPARTMENT_OTHER): Payer: Medicare Other | Admitting: Physician Assistant

## 2022-12-19 DIAGNOSIS — C61 Malignant neoplasm of prostate: Secondary | ICD-10-CM | POA: Diagnosis not present

## 2022-12-19 LAB — GLUCOSE, CAPILLARY
Glucose-Capillary: 117 mg/dL — ABNORMAL HIGH (ref 70–99)
Glucose-Capillary: 96 mg/dL (ref 70–99)
Glucose-Capillary: 116 mg/dL — ABNORMAL HIGH (ref 70–99)
Glucose-Capillary: 126 mg/dL — ABNORMAL HIGH (ref 70–99)

## 2022-12-19 NOTE — Progress Notes (Addendum)
KAEDEN, MESTER ([id number]) [id number] Page 1 of 2 Visit Report for 12/19/2022 Problem List Details Patient Name: Date of Service: Jose [name], [name] 12/19/2022 12:00 PM Medical Record Number: [id number] Patient Account Number: [id number] Date of Birth/Sex: Treating RN: Sep 13, 1950 (72 y.o. 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 12/19/2022 Medical Record Number: [id number] Patient Account Number: [id number] Date of Birth/Sex: Treating RN: 1950/09/04 (72 y.o. 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 R35.0 Frequency of micturition Jose [name], Jose [name] ([id number]) [phone].pdf Page 2 of 2 R30.0 Dysuria C61 Malignant neoplasm of prostate Facility Procedures : CPT4 Code Description: [id number]-(Facility Use Only) HBOT full body chamber, , ICD-10 Diagnosis Description N30.40 Irradiation cystitis without  hematuria R35.0 Frequency of micturition R30.0 Dysuria C61 Malignant neoplasm of prostate Modifier: Quantity: 4 Physician Procedures : CPT4 Code Description Modifier [id number] - WC PHYS HYPERBARIC OXYGEN THERAPY ICD-10 Diagnosis Description 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

## 2022-12-19 NOTE — Progress Notes (Signed)
HAGAN, VANAUKEN ([id number]) [id number] Page 1 of 1 Visit Report for 12/19/2022 Arrival Information Details Patient Name: Date of Service: Jose [name], [name] 12/19/2022 12:00 PM Medical Record Number: [id number] Patient Account Number: [id number] Date of Birth/Sex: Treating RN: 1950-10-06 (72 y.o. [name], [name] Primary Care Kaislyn Gulas: [name] Other Clinician: Karl Bales Referring Tiffanee Mcnee: Treating Sion Thane/Extender: Rhett Bannister Weeks in Treatment: 2 Visit Information History Since Last Visit All ordered tests and consults were completed: Yes Patient Arrived: Ambulatory Added or deleted any medications: No Arrival Time: 11:15 Any new allergies or adverse reactions: No Accompanied By: Wife Had a fall or experienced change in No Transfer Assistance: None activities of daily living that may affect Patient Identification Verified: Yes risk of falls: Secondary Verification Process Completed: Yes Signs or symptoms of abuse/neglect since last visito No Patient Requires Transmission-Based Precautions: No Hospitalized since last visit: No Patient Has Alerts: No Implantable device outside of the clinic excluding No cellular tissue based products placed in the center since last visit: Pain Present Now: No Electronic Signature(s) Signed: 12/19/2022 1:18:45 PM By: Karl Bales EMT Entered By: Karl Bales on 12/19/2022 10:18:45 -------------------------------------------------------------------------------- Vitals Details Patient Name: Date of Service: Jose Sousa MES D. 12/19/2022 12:00 PM Medical Record Number: [id number] Patient Account Number: [id number] Date of Birth/Sex: Treating RN: February 25, 1951 (72 y.o. [name] Primary Care Azalea Cedar: [name] Other Clinician: Karl Bales Referring Totiana Everson: Treating Wilfred Siverson/Extender: Abigail Miyamoto , Minerva Ends Weeks in Treatment: 2 Vital Signs Time  Taken: 11:54 Capillary Blood Glucose (mg/dl): 086 Height (in): 66 Reference Range: 80 - 120 mg / dl Weight (lbs): 578.4 Body Mass Index (BMI): 29.8 Electronic Signature(s) Signed: 12/19/2022 1:19:05 PM By: Karl Bales EMT Entered By: Karl Bales on 12/19/2022 10:19:04

## 2022-12-19 NOTE — Progress Notes (Addendum)
Jose [name] ([id number]) [id number] Page 1 of 2 Visit Report for 12/19/2022 HBO Details Patient Name: Date of Service: Jose [name], Jose MES [name]. 12/19/2022 12:00 PM Medical Record Number: [id number] Patient Account Number: [id number] Date of Birth/Sex: Treating RN: February 24, 1951 (72 y.o. [name], [name] Primary Care Reynard Christoffersen: [name] Other Clinician: Karl Bales Referring Evetta Renner: Treating Harlow Basley/Extender: Rhett Bannister Weeks in Treatment: 2 HBO Treatment Course Details Treatment Course Number: 1 Ordering Shyam Dawson: Linard Millers Treatments Ordered: otal 40 HBO Treatment Start Date: 12/17/2022 HBO Indication: Late Effect of Radiation HBO Treatment Details Treatment Number: 2 Patient Type: Outpatient Chamber Type: Monoplace Chamber Serial #: [id number] Treatment Protocol: 2.0 ATA with 90 minutes oxygen, and no air breaks Treatment Details Compression Rate Down: 1.0 psi / minute De-Compression Rate Up: Air breaks and breathing Decompress Decompress Compress Tx Pressure Begins Reached periods Begins Ends (leave unused spaces blank) Chamber Pressure (ATA 1 2 ------2 1 ) Clock Time (24 hr) 12:58 13:12 - - - - - - 14:43 14:57 Treatment Length: 119 (minutes) Treatment Segments: 4 Vital Signs Capillary Blood Glucose Reference Range: 80 - 120 mg / dl HBO Diabetic Blood Glucose Intervention Range: <131 mg/dl or >308 mg/dl Type: Time Vitals Blood Pulse: Respiratory Temperature: Capillary Blood Glucose Pulse Action Taken: Pressure: Rate: Glucose (mg/dl): Meter #: Oximetry (%) Taken: Pre 11:54 117 Patient given 8 oz Glucerna with 8oz orange juice. Pre 12:09 96 Patient given 8 oz Glucerna Pre 12:39 116 Pre 12:35 178/84 82 16 98.9 Post 15:03 217/89 63 16 98.2 126 Treatment Response Treatment Toleration: Well Treatment Completion Status: Treatment Completed without Adverse Event Treatment Notes On arrival the patient blood  sugar was 117. He was given 8 oz of Glucerna with 8 oz orange juice. Blood sugar was rechecked @1209  blood sugar was 96. I called Allen Derry PA. Leonard Schwartz came over to speak with the patient. Leonard Schwartz ordered another 8 oz of Glucerna to wait and recheck blood sugar. The patient was given the 8 oz of Glucerna. the blood sugar was rechecked @ 1239. The blood was 116. I called [facility]. He ordered to start treatment and send into the chamber 8 oz of orange juice for emergency and call him if the patient showed any signs of his blood sugar dropping. Post treatment v/S and blood sugar reported to [employer] PA. Electronic Signature(s) Signed: 12/19/2022 3:33:42 PM By: Karl Bales EMT Signed: 12/19/2022 4:26:00 PM By: Allen Derry PA-C Previous Signature: 12/19/2022 3:11:11 PM Version By: Karl Bales EMT Previous Signature: 12/19/2022 1:28:08 PM Version By: Karl Bales EMT Entered By: Karl Bales on 12/19/2022 15:33:42 [name] ([id number]) [id number] Page 2 of 2 -------------------------------------------------------------------------------- HBO Safety Checklist Details Patient Name: Date of Service: Jose [name] MES [name]. 12/19/2022 12:00 PM Medical Record Number: [id number] Patient Account Number: [id number] Date of Birth/Sex: Treating RN: 08-30-1950 (72 y.o. [name], [name] Primary Care Gwenneth Whiteman: [name] Other Clinician: Karl Bales Referring Yasemin Rabon: Treating Dawnielle Christiana/Extender: Rhett Bannister Weeks in Treatment: 2 HBO Safety Checklist Items Safety Checklist Consent Form Signed Patient voided / foley secured and emptied When did you last eato 0745 Last dose of injectable or oral agent 0745 Ostomy pouch emptied and vented if applicable NA All implantable devices assessed, documented and approved NA Intravenous access site secured and place NA Valuables secured NA Linens and cotton and cotton/polyester blend (less than 51%  polyester) Personal oil-based products / skin lotions / body lotions removed Wigs or hairpieces removed NA Smoking  or tobacco materials removed Books / newspapers / magazines / loose paper removed Cologne, aftershave, perfume and deodorant removed Jewelry removed (may wrap wedding band) Make-up removed NA Hair care products removed Battery operated devices (external) removed Heating patches and chemical warmers removed Titanium eyewear removed NA Nail polish cured greater than 10 hours NA Casting material cured greater than 10 hours NA Hearing aids removed NA Loose dentures or partials removed NA Prosthetics have been removed NA Patient demonstrates correct use of air break device (if applicable) Patient concerns have been addressed Patient grounding bracelet on and cord attached to chamber Specifics for Inpatients (complete in addition to above) Medication sheet sent with patient NA Intravenous medications needed or due during therapy sent with patient NA Drainage tubes (e.g. nasogastric tube or chest tube secured and vented) NA Endotracheal or Tracheotomy tube secured NA Cuff deflated of air and inflated with saline NA Airway suctioned NA Notes safety checklist was done before the treatment was started. Electronic Signature(s) Signed: 12/19/2022 1:20:40 PM By: Karl Bales EMT Entered By: Karl Bales on 12/19/2022 13:20:40

## 2022-12-20 ENCOUNTER — Encounter (HOSPITAL_BASED_OUTPATIENT_CLINIC_OR_DEPARTMENT_OTHER): Payer: Medicare Other | Admitting: Internal Medicine

## 2022-12-20 DIAGNOSIS — C61 Malignant neoplasm of prostate: Secondary | ICD-10-CM | POA: Diagnosis not present

## 2022-12-20 LAB — GLUCOSE, CAPILLARY
Glucose-Capillary: 109 mg/dL — ABNORMAL HIGH (ref 70–99)
Glucose-Capillary: 101 mg/dL — ABNORMAL HIGH (ref 70–99)
Glucose-Capillary: 123 mg/dL — ABNORMAL HIGH (ref 70–99)

## 2022-12-20 NOTE — Progress Notes (Signed)
DEMARRIUS, GUERRERO ([id number]) [id number] Page 1 of 2 Visit Report for 12/20/2022 Arrival Information Details Patient Name: Date of Service: SUEDE, GREENAWALT MES D. 12/20/2022 12:00 PM Medical Record Number: [id number] Patient Account Number: [id number] Date of Birth/Sex: Treating RN: 03-30-1950 (72 y.o. [name] Primary Care Aj Crunkleton: [name] Other Clinician: Karl Bales Referring Cullen Lahaie: Treating Tyasia Packard/Extender: [name] in Treatment: 2 Visit Information History Since Last Visit All ordered tests and consults were completed: Yes Patient Arrived: Ambulatory Added or deleted any medications: No Arrival Time: 11:15 Any new allergies or adverse reactions: No Accompanied By: Wife Had a fall or experienced change in No Transfer Assistance: None activities of daily living that may affect Patient Identification Verified: Yes risk of falls: Secondary Verification Process Completed: Yes Signs or symptoms of abuse/neglect since last visito No Patient Requires Transmission-Based Precautions: No Hospitalized since last visit: No Patient Has Alerts: No Implantable device outside of the clinic excluding No cellular tissue based products placed in the center since last visit: Pain Present Now: No Electronic Signature(s) Signed: 12/20/2022 3:22:38 PM By: Karl Bales EMT Entered By: Karl Bales on 12/20/2022 12:22:37 -------------------------------------------------------------------------------- Encounter Discharge Information Details Patient Name: Date of Service: [name] 12/20/2022 12:00 PM Medical Record Number: [id number] Patient Account Number: [id number] Date of Birth/Sex: Treating RN: 10-26-1950 (72 y.o. [name] Primary Care Raynesha Tiedt: [name] Other Clinician: Karl Bales Referring Firman Petrow: Treating Ashar Lewinski/Extender: [name] in  Treatment: 2 Encounter Discharge Information Items Discharge Condition: Stable Ambulatory Status: Ambulatory Discharge Destination: Home Transportation: Private Auto Accompanied By: Wife Schedule Follow-up Appointment: Yes Clinical Summary of Care: Electronic Signature(s) Signed: 12/20/2022 3:40:16 PM By: Karl Bales EMT Entered By: Karl Bales on 12/20/2022 12:40:16 [name] ([id number]) [id number] Page 2 of 2 -------------------------------------------------------------------------------- Vitals Details Patient Name: Date of Service: JUANANTONIO, STOLAR MES D. 12/20/2022 12:00 PM Medical Record Number: [id number] Patient Account Number: [id number] Date of Birth/Sex: Treating RN: Nov 18, 1950 (72 y.o. [name] Primary Care Refugio Mcconico: [name] Other Clinician: Karl Bales Referring Nitasha Jewel: Treating Romonia Yanik/Extender: [name] in Treatment: 2 Vital Signs Time Taken: 11:19 Capillary Blood Glucose (mg/dl): 366 Height (in): 66 Reference Range: 80 - 120 mg / dl Weight (lbs): 440.3 Body Mass Index (BMI): 29.8 Electronic Signature(s) Signed: 12/20/2022 3:30:12 PM By: Karl Bales EMT Entered By: Karl Bales on 12/20/2022 12:30:12

## 2022-12-20 NOTE — Progress Notes (Addendum)
XAVER, HAMES [name] ([id number]) [id number] Page 1 of 2 Visit Report for 12/20/2022 HBO Details Patient Name: Date of Service: Jose [name], Jose [name]. 12/20/2022 12:00 PM Medical Record Number: [id number] Patient Account Number: [id number] Date of Birth/Sex: Treating RN: 12/26/1950 (72 y.o. [name] Primary Care Jacobb Alen: [name] Other Clinician: Karl Bales Referring Tameeka Luo: Treating Palmira Stickle/Extender: [name] in Treatment: 2 HBO Treatment Course Details Treatment Course Number: 1 Ordering Romney Compean: Baltazar Najjar T Treatments Ordered: otal 40 HBO Treatment Start Date: 12/17/2022 HBO Indication: Late Effect of Radiation HBO Treatment Details Treatment Number: 3 Patient Type: Outpatient Chamber Type: Monoplace Chamber Serial #: [id number] Treatment Protocol: 2.0 ATA with 90 minutes oxygen, and no air breaks Treatment Details Compression Rate Down: 1.0 psi / minute De-Compression Rate Up: 1.0 psi / minute Air breaks and breathing Decompress Decompress Compress Tx Pressure Begins Reached periods Begins Ends (leave unused spaces blank) Chamber Pressure (ATA 1 2 ------2 1 ) Clock Time (24 hr) 12:02 12:13 - - - - - - 13:44 13:59 Treatment Length: 117 (minutes) Treatment Segments: 4 Vital Signs Capillary Blood Glucose Reference Range: 80 - 120 mg / dl HBO Diabetic Blood Glucose Intervention Range: <131 mg/dl or >696 mg/dl Time Vitals Blood Respiratory Capillary Blood Glucose Pulse Action Type: Pulse: Temperature: Taken: Pressure: Rate: Glucose (mg/dl): Meter #: Oximetry (%) Taken: Pre 11:19 109 Post 14:07 220/93 64 18 98.4 123 Pre 11:45 183/87 75 18 97.9 109 Treatment Response Treatment Toleration: Well Treatment Completion Status: Treatment Completed without Adverse Event Treatment Notes On arrival the patient blood sugar was 109. The patient was given 8 oz Glucerna with 8 oz orange juice. I  spoke with Dr. Leanord Hawking about this patient. He stated to go head and treat the patient after rechecking his blood sugar. At 1145 blood sugar was 101. Dr. Leanord Hawking was informed. The patient was placed into the chamber with 8 oz of orange juice for emergency. The patient was monitored during treatment. No problems noted The post treatment blood sugar was 123. His blood pressure was 220/93. Dr. Leanord Hawking was informed before discharging the patient home with his wife. Ahman Dugdale Notes Patient tolerated treatment well. He comes in consistently with blood sugars in the lower range for our comfort and treatment in the 90-100 range. This is in spite of eating what sounds historically to be a more than adequate caloric breakfast breakfast. He takes Amaryl 2 mg. Fortunately his blood sugar does not seem to drop in the chamber. Physician HBO Attestation: I certify that I supervised this HBO treatment in accordance with Medicare guidelines. A trained emergency response team is readily available per Yes hospital policies and procedures. Continue HBOT as ordered. [address] BARON, STEINBACK ([id number]) [id number] Page 2 of 2 Electronic Signature(s) Signed: 12/24/2022 6:11:24 PM By: Baltazar Najjar MD Previous Signature: 12/20/2022 3:38:58 PM Version By: Karl Bales EMT Entered By: Baltazar Najjar on 12/20/2022 16:05:55 -------------------------------------------------------------------------------- HBO Safety Checklist Details Patient Name: Date of Service: Jose Sousa MES [name]. 12/20/2022 12:00 PM Medical Record Number: [id number] Patient Account Number: [id number] Date of Birth/Sex: Treating RN: [birth year] (72 y.o. [name] Primary Care Madolin Twaddle: [name] Other Clinician: Karl Bales Referring Amalee Olsen: Treating Ariyana Faw/Extender: [name] in Treatment: 2 HBO Safety Checklist Items Safety Checklist Consent Form Signed Patient voided /  foley secured and emptied When did you last eato 0800 Last dose of injectable or oral agent 0830 Ostomy pouch emptied and vented if applicable NA All implantable devices  assessed, documented and approved NA Intravenous access site secured and place NA Valuables secured Linens and cotton and cotton/polyester blend (less than 51% polyester) Personal oil-based products / skin lotions / body lotions removed Wigs or hairpieces removed NA Smoking or tobacco materials removed Books / newspapers / magazines / loose paper removed Cologne, aftershave, perfume and deodorant removed Jewelry removed (may wrap wedding band) Make-up removed NA Hair care products removed Battery operated devices (external) removed Heating patches and chemical warmers removed Titanium eyewear removed NA Nail polish cured greater than 10 hours NA Casting material cured greater than 10 hours NA Hearing aids removed NA Loose dentures or partials removed NA Prosthetics have been removed NA Patient demonstrates correct use of air break device (if applicable) Patient concerns have been addressed Patient grounding bracelet on and cord attached to chamber Specifics for Inpatients (complete in addition to above) Medication sheet sent with patient NA Intravenous medications needed or due during therapy sent with patient NA Drainage tubes (e.g. nasogastric tube or chest tube secured and vented) NA Endotracheal or Tracheotomy tube secured NA Cuff deflated of air and inflated with saline NA Airway suctioned NA Notes The safety checklist was done before the treatment was started. Electronic Signature(s) Signed: 12/20/2022 3:31:32 PM By: Karl Bales EMT Entered By: Karl Bales on 12/20/2022 15:31:32

## 2022-12-21 ENCOUNTER — Encounter (HOSPITAL_BASED_OUTPATIENT_CLINIC_OR_DEPARTMENT_OTHER): Payer: Medicare Other | Admitting: General Surgery

## 2022-12-21 DIAGNOSIS — C61 Malignant neoplasm of prostate: Secondary | ICD-10-CM | POA: Diagnosis not present

## 2022-12-21 LAB — GLUCOSE, CAPILLARY
Glucose-Capillary: 156 mg/dL — ABNORMAL HIGH (ref 70–99)
Glucose-Capillary: 111 mg/dL — ABNORMAL HIGH (ref 70–99)

## 2022-12-21 NOTE — Progress Notes (Addendum)
PAGE, RAMAGLIA D ([id number]) [id number] Page 1 of 2 Visit Report for 12/21/2022 HBO Details Patient Name: Date of Service: [name], [name] 12/21/2022 10:00 A M Medical Record Number: [id number] Patient Account Number: [id number] Date of Birth/Sex: Treating RN: 1950/12/06 (72 y.o. Jose [name] Primary Care Rosabell Geyer: [name] Other Clinician: Karl Bales Referring Silvino Selman: Treating Refugia Laneve/Extender: Delena Bali Weeks in Treatment: 2 HBO Treatment Course Details Treatment Course Number: 1 Ordering Maryan Sivak: Baltazar Najjar T Treatments Ordered: otal 40 HBO Treatment Start Date: 12/17/2022 HBO Indication: Late Effect of Radiation HBO Treatment Details Treatment Number: 4 Patient Type: Outpatient Chamber Type: Monoplace Chamber Serial #: [id number] Treatment Protocol: 2.0 ATA with 90 minutes oxygen, and no air breaks Treatment Details Compression Rate Down: 1.0 psi / minute De-Compression Rate Up: 1.0 psi / minute Air breaks and breathing Decompress Decompress Compress Tx Pressure Begins Reached periods Begins Ends (leave unused spaces blank) Chamber Pressure (ATA 1 2 ------2 1 ) Clock Time (24 hr) 10:13 10:28 - - - - - - 11:58 12:14 Treatment Length: 121 (minutes) Treatment Segments: 4 Vital Signs Capillary Blood Glucose Reference Range: 80 - 120 mg / dl HBO Diabetic Blood Glucose Intervention Range: <131 mg/dl or >413 mg/dl Type: Time Vitals Blood Respiratory Capillary Blood Glucose Pulse Action Pulse: Temperature: Taken: Pressure: Rate: Glucose (mg/dl): Meter #: Oximetry (%) Taken: Pre 09:32 156 none per protocol Pre 09:52 156/79 65 1698.6 none per protocol Post 12:18 198/84 54 18 98.2 111 informed physician Treatment Response Treatment Toleration: Well Treatment Completion Status: Treatment Completed without Adverse Event Treatment Notes Mr. Cerino arrived with normal vital signs. He prepared for  treatment. After performing a safety check, patient was placed in the chamber which was compressed with 100% oxygen at a rate of 1 psi/min. He tolerated the treatment and the subsequent decompression of the chamber at 1 psi/min as well. He denied any issues with ear equalization and/or pain associated with barotrauma. His post-treatment blood pressure was high at 198/84 mmHg, Followed by 202/88 mmHg, confirmed with manual BP cuff. He denied symptoms associated with hypertension stating that he felt fine. His heart rate was below 60 beats per minute, and was asymptomatic as well. He was stable upon discharge with spouse. Physician HBO Attestation: I certify that I supervised this HBO treatment in accordance with Medicare guidelines. A trained emergency response team is readily available per Yes hospital policies and procedures. Continue HBOT as ordered. Yes Electronic Signature(s) Signed: 12/21/2022 12:49:24 PM By: Haywood Pao CHT EMT BS , , Signed: 12/24/2022 11:01:28 AM By: Duanne Guess MD FACS Marvis Repress D ([id number]) [id number] Page 2 of 2 Signed: 12/24/2022 11:01:28 AM By: Duanne Guess MD FACS Previous Signature: 12/21/2022 12:34:09 PM Version By: Duanne Guess MD FACS Previous Signature: 12/21/2022 10:42:24 AM Version By: Karl Bales EMT Entered By: Haywood Pao on 12/21/2022 09:49:23 -------------------------------------------------------------------------------- HBO Safety Checklist Details Patient Name: Date of Service: Jose [name] MES D. 12/21/2022 10:00 A M Medical Record Number: [id number] Patient Account Number: [id number] Date of Birth/Sex: Treating RN: 05/09/1950 (72 y.o. Jose [name] Primary Care Tevion Laforge: [name] Other Clinician: Karl Bales Referring Rawlins Stuard: Treating Maclain Cohron/Extender: Delena Bali Weeks in Treatment: 2 HBO Safety Checklist Items Safety Checklist Consent Form  Signed Patient voided / foley secured and emptied When did you last eato 0800 Last dose of injectable or oral agent 0830 Ostomy pouch emptied and vented if applicable NA All implantable devices assessed, documented and approved NA Intravenous access site  secured and place NA Valuables secured Linens and cotton and cotton/polyester blend (less than 51% polyester) Personal oil-based products / skin lotions / body lotions removed Wigs or hairpieces removed NA Smoking or tobacco materials removed Books / newspapers / magazines / loose paper removed Cologne, aftershave, perfume and deodorant removed Jewelry removed (may wrap wedding band) Make-up removed NA Hair care products removed Battery operated devices (external) removed Heating patches and chemical warmers removed Titanium eyewear removed NA Nail polish cured greater than 10 hours NA Casting material cured greater than 10 hours NA Hearing aids removed NA Loose dentures or partials removed NA Prosthetics have been removed NA Patient demonstrates correct use of air break device (if applicable) Patient concerns have been addressed Patient grounding bracelet on and cord attached to chamber Specifics for Inpatients (complete in addition to above) Medication sheet sent with patient NA Intravenous medications needed or due during therapy sent with patient NA Drainage tubes (e.g. nasogastric tube or chest tube secured and vented) NA Endotracheal or Tracheotomy tube secured NA Cuff deflated of air and inflated with saline NA Airway suctioned NA Notes The safety checklist was done before the treatment was started. Electronic Signature(s) Signed: 12/21/2022 10:41:31 AM By: Karl Bales EMT Entered By: Karl Bales on 12/21/2022 07:41:31

## 2022-12-21 NOTE — Progress Notes (Addendum)
SALVATOR, MUHL ([id number]) [id number] Page 1 of 2 Visit Report for 12/21/2022 Arrival Information Details Patient Name: Date of Service: Jose [name], Jose [name] MES D. 12/21/2022 10:00 A M Medical Record Number: [id number] Patient Account Number: [id number] Date of Birth/Sex: Treating RN: [birth year] (72 y.o. Jose [name], [name] Primary Care Dorian Duval: [name] Other Clinician: Karl Bales Referring Rocklin Soderquist: Treating Oluchi Pucci/Extender: Delena Bali Weeks in Treatment: 2 Visit Information History Since Last Visit All ordered tests and consults were completed: Yes Patient Arrived: Ambulatory Added or deleted any medications: No Arrival Time: 09:16 Any new allergies or adverse reactions: No Accompanied By: Wife Had a fall or experienced change in No Transfer Assistance: None activities of daily living that may affect Patient Identification Verified: Yes risk of falls: Secondary Verification Process Completed: Yes Signs or symptoms of abuse/neglect since last visito No Patient Requires Transmission-Based Precautions: No Hospitalized since last visit: No Patient Has Alerts: No Implantable device outside of the clinic excluding No cellular tissue based products placed in the center since last visit: Pain Present Now: No Electronic Signature(s) Signed: 12/21/2022 10:40:00 AM By: Karl Bales EMT Entered By: Karl Bales on 12/21/2022 10:40:00 -------------------------------------------------------------------------------- Encounter Discharge Information Details Patient Name: Date of Service: Jose [name] MES D. 12/21/2022 10:00 A M Medical Record Number: [id number] Patient Account Number: [id number] Date of Birth/Sex: Treating RN: Dec 25, 1950 (72 y.o. Jose [name] Primary Care Inis Borneman: [name] Other Clinician: Karl Bales Referring Isabele Lollar: Treating Shahira Fiske/Extender: Sigmund Hazel in  Treatment: 2 Encounter Discharge Information Items Discharge Condition: Stable Ambulatory Status: Ambulatory Discharge Destination: Home Transportation: Private Auto Accompanied By: spouse Schedule Follow-up Appointment: No Clinical Summary of Care: Electronic Signature(s) Signed: 12/21/2022 12:50:14 PM By: Haywood Pao CHT EMT BS , , Entered By: Haywood Pao on 12/21/2022 12:50:14 [name] ([id number]) [id number] Page 2 of 2 -------------------------------------------------------------------------------- Vitals Details Patient Name: Date of Service: Jose [name], Jose [name] MES D. 12/21/2022 10:00 A M Medical Record Number: [id number] Patient Account Number: [id number] Date of Birth/Sex: Treating RN: 1950-03-15 (72 y.o. Jose [name] Primary Care Cloy Cozzens: [name] Other Clinician: Karl Bales Referring Dawaun Brancato: Treating Rahm Minix/Extender: Delena Bali Weeks in Treatment: 2 Vital Signs Time Taken: 09:32 Capillary Blood Glucose (mg/dl): 425 Height (in): 66 Reference Range: 80 - 120 mg / dl Weight (lbs): 956.3 Body Mass Index (BMI): 29.8 Electronic Signature(s) Signed: 12/21/2022 10:40:18 AM By: Karl Bales EMT Entered By: Karl Bales on 12/21/2022 10:40:18

## 2022-12-24 ENCOUNTER — Encounter (HOSPITAL_BASED_OUTPATIENT_CLINIC_OR_DEPARTMENT_OTHER): Payer: Medicare Other | Admitting: Internal Medicine

## 2022-12-24 DIAGNOSIS — C61 Malignant neoplasm of prostate: Secondary | ICD-10-CM | POA: Diagnosis not present

## 2022-12-24 LAB — GLUCOSE, CAPILLARY
Glucose-Capillary: 82 mg/dL (ref 70–99)
Glucose-Capillary: 121 mg/dL — ABNORMAL HIGH (ref 70–99)

## 2022-12-24 NOTE — Progress Notes (Signed)
[name], MAX ([id number]) [id number] Page 1 of 1 Visit Report for 12/21/2022 SuperBill Details Patient Name: Date of Service: [name], Jose [name]. 12/21/2022 Medical Record Number: [id number] Patient Account Number: [id number] Date of Birth/Sex: Treating RN: [birth year] (72 y.o. 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

## 2022-12-24 NOTE — Progress Notes (Signed)
WYLIE, BRUSKI D ([id number]) [id number] Page 1 of 2 Visit Report for 12/24/2022 Arrival Information Details Patient Name: Date of Service: Jose [name], [name] 12/24/2022 12:00 PM Medical Record Number: [id number] Patient Account Number: [id number] Date of Birth/Sex: Treating RN: [birth year] (72 y.o. [name], [name] Primary Care Baley Lorimer: [name] Other Clinician: Haywood Pao Referring Christyana Corwin: Treating Shrihan Putt/Extender: [name] in Treatment: 2 Visit Information History Since Last Visit All ordered tests and consults were completed: Yes Patient Arrived: Ambulatory Added or deleted any medications: No Arrival Time: 12:33 Any new allergies or adverse reactions: No Accompanied By: spouse Had a fall or experienced change in No Transfer Assistance: None activities of daily living that may affect Patient Identification Verified: Yes risk of falls: Secondary Verification Process Completed: Yes Signs or symptoms of abuse/neglect since last visito No Patient Requires Transmission-Based Precautions: No Hospitalized since last visit: No Patient Has Alerts: No Implantable device outside of the clinic excluding No cellular tissue based products placed in the center since last visit: Pain Present Now: No Electronic Signature(s) Signed: 12/24/2022 4:43:48 PM By: Haywood Pao CHT EMT BS , , Entered By: Haywood Pao on 12/24/2022 13:43:48 -------------------------------------------------------------------------------- Encounter Discharge Information Details Patient Name: Date of Service: Jose Sousa MES D. 12/24/2022 12:00 PM Medical Record Number: [id number] Patient Account Number: [id number] Date of Birth/Sex: Treating RN: 1950/06/06 (72 y.o. [name] Primary Care Tennille Montelongo: [name] Other Clinician: Haywood Pao Referring Rickeya Manus: Treating Sephiroth Mcluckie/Extender: [name] in Treatment: 2 Encounter Discharge Information Items Discharge Condition: Stable Ambulatory Status: Ambulatory Discharge Destination: Home Transportation: Private Auto Accompanied By: spouse Schedule Follow-up Appointment: No Clinical Summary of Care: Electronic Signature(s) Signed: 12/25/2022 11:38:03 AM By: Haywood Pao CHT EMT BS , , Entered By: Haywood Pao on 12/25/2022 08:38:03 [name] ([id number]) [phone].pdf Page 2 of 2 -------------------------------------------------------------------------------- Vitals Details Patient Name: Date of Service: Jose [name], [name] 12/24/2022 12:00 PM Medical Record Number: [id number] Patient Account Number: [id number] Date of Birth/Sex: Treating RN: 18-Feb-1951 (72 y.o. [name] Primary Care Jaryiah Mehlman: [name] Other Clinician: Haywood Pao Referring Helayne Metsker: Treating Azaylea Maves/Extender: [name] in Treatment: 2 Vital Signs Time Taken: 13:51 Temperature (F): 97.7 Height (in): 66 Pulse (bpm): 61 Weight (lbs): 184.6 Respiratory Rate (breaths/min): 18 Body Mass Index (BMI): 29.8 Blood Pressure (mmHg): 203/79 Capillary Blood Glucose (mg/dl): 81 Reference Range: 80 - 120 mg / dl Electronic Signature(s) Signed: 12/24/2022 4:45:16 PM By: Haywood Pao CHT EMT BS , , Entered By: Haywood Pao on 12/24/2022 13:45:16

## 2022-12-24 NOTE — Progress Notes (Signed)
PANKAJ, HOUCHEN [name] ([id number]) [id number] Page 1 of 3 Visit Report for 12/24/2022 HBO Details Patient Name: Date of Service: Jose [name], Jose [name]. 12/24/2022 12:00 PM Medical Record Number: [id number] Patient Account Number: [id number] Date of Birth/Sex: Treating RN: 1950-10-25 (72 y.o. Jose [name] Primary Care Jose [name]: Jose [name] Other Clinician: Haywood [name] Referring Jose [name]: Treating Jose [name]/Extender: Jose [name] in Treatment: 2 HBO Treatment Course Details Treatment Course Number: 1 Ordering Jose [name]: Jose [name] T Treatments Ordered: otal 40 HBO Treatment Start Date: 12/17/2022 HBO Indication: Late Effect of Radiation HBO Treatment Details Treatment Number: 5 Patient Type: Outpatient Chamber Type: Monoplace Chamber Serial #: [id number] Treatment Protocol: 2.0 ATA with 90 minutes oxygen, and no air breaks Treatment Details Compression Rate Down: 1.0 psi / minute De-Compression Rate Up: 1.5 psi / minute Air breaks and breathing Decompress Decompress Compress Tx Pressure Begins Reached periods Begins Ends (leave unused spaces blank) Chamber Pressure (ATA 1 2 ------2 1 ) Clock Time (24 hr) 14:07 14:22 - - - - - - 15:52 16:04 Treatment Length: 117 (minutes) Treatment Segments: 4 Vital Signs Capillary Blood Glucose Reference Range: 80 - 120 mg / dl HBO Diabetic Blood Glucose Intervention Range: <131 mg/dl or >518 mg/dl Type: Time Vitals Blood Respiratory Capillary Blood Glucose Pulse Action Pulse: Temperature: Taken: Pressure: Rate: Glucose (mg/dl): Meter #: Oximetry (%) Taken: Pre 13:51 203/79 61 18 97.7 81 informed physician Pre 14:05 189/78 Post 13:38 121 Post 16:06 205/88 54 18 97.8 109 none per protocol Treatment Response Treatment Toleration: Well Treatment Completion Status: Treatment Completed without Adverse Event Treatment Notes Jose [name] arrived with blood glucose level of 81  mg/dL. He was given 8 oz Glucerna and 2 x 4oz orange juice. Blood glucose re-checked at 1338 with result of 121 mg/dL. He prepared for treatment. Blood pressure was measured at 203/79 mmHg. After performing a safety check, he was placed in the chamber which was compressed at a rate of 1.5 psi/min after confirming normal ear equalization (at approximately 10 psig). He tolerated said travel. Patient tolerated treatment and subsequent decompression of the chamber at a rate of 1.5 psi/min. His post-treatment vitals were normal with the exception of systolic BP of 205 mmHg and heart rage of 52 bpm. Patient denied symptoms related to either hypertension and bradycardia. He was stable upon discharge with his spouse. Jose [name] Notes No concerns with treatment given. Once again problems with reasonably normal blood sugar is on presentation. Given glycerone and orange juice. Blood sugar came up to 121. He does not drop in treatment therefore was helped to be safe to start HBO treatment. Physician HBO Attestation: I certify that I supervised this HBO treatment in accordance with Medicare guidelines. A trained emergency response team is readily available per Yes hospital policies and procedures. Continue HBOT as ordered. [address] Jose [name], Jose [name] ([id number]) [id number] Page 2 of 3 Electronic Signature(s) Signed: 12/24/2022 6:11:24 PM By: Jose Najjar MD Previous Signature: 12/24/2022 5:05:06 PM Version By: Jose [name] CHT EMT BS , , Previous Signature: 12/24/2022 4:56:38 PM Version By: Jose [name] CHT EMT BS , , Previous Signature: 12/24/2022 4:54:51 PM Version By: Jose [name] CHT EMT BS , , Entered By: Jose [name] on 12/24/2022 18:09:04 -------------------------------------------------------------------------------- HBO Safety Checklist Details Patient Name: Date of Service: Jose [name]. 12/24/2022 12:00 PM Medical Record Number: [id number] Patient Account  Number: [id number] Date of Birth/Sex: Treating RN: May 08, 1950 (72 y.o. Jose [name] Primary Care Naomii Kreger: Jose [name] Other Clinician: Haywood [name] Referring Corrigan Kretschmer:  Treating Shane Badeaux/Extender: Marti Sleigh Weeks in Treatment: 2 HBO Safety Checklist Items Safety Checklist Consent Form Signed Patient voided / foley secured and emptied When did you last eato 0830 Last dose of injectable or oral agent 0815 - glimipiride Ostomy pouch emptied and vented if applicable NA All implantable devices assessed, documented and approved NA Intravenous access site secured and place NA Valuables secured Linens and cotton and cotton/polyester blend (less than 51% polyester) Personal oil-based products / skin lotions / body lotions removed Wigs or hairpieces removed NA Smoking or tobacco materials removed NA Books / newspapers / magazines / loose paper removed Cologne, aftershave, perfume and deodorant removed Jewelry removed (may wrap wedding band) Make-up removed NA Hair care products removed Battery operated devices (external) removed Heating patches and chemical warmers removed Titanium eyewear removed Nail polish cured greater than 10 hours NA Casting material cured greater than 10 hours NA Hearing aids removed NA Loose dentures or partials removed NA Prosthetics have been removed NA Patient demonstrates correct use of air break device (if applicable) Patient concerns have been addressed Patient grounding bracelet on and cord attached to chamber Specifics for Inpatients (complete in addition to above) Medication sheet sent with patient NA Intravenous medications needed or due during therapy sent with patient NA Drainage tubes (e.g. nasogastric tube or chest tube secured and vented) NA Endotracheal or Tracheotomy tube secured NA Cuff deflated of air and inflated with saline NA Airway suctioned NA Notes Paper version used prior  to treatment start. Electronic Signature(s) Signed: 12/24/2022 4:47:24 PM By: Jose [name] CHT EMT BS , , Entered By: Jose [name] on 12/24/2022 16:47:24 Jose [name] ([id number]) [id number] Page 3 of 3

## 2022-12-25 ENCOUNTER — Encounter (HOSPITAL_BASED_OUTPATIENT_CLINIC_OR_DEPARTMENT_OTHER): Payer: Medicare Other | Admitting: Internal Medicine

## 2022-12-25 DIAGNOSIS — C61 Malignant neoplasm of prostate: Secondary | ICD-10-CM | POA: Diagnosis not present

## 2022-12-25 LAB — GLUCOSE, CAPILLARY
Glucose-Capillary: 109 mg/dL — ABNORMAL HIGH (ref 70–99)
Glucose-Capillary: 145 mg/dL — ABNORMAL HIGH (ref 70–99)
Glucose-Capillary: 114 mg/dL — ABNORMAL HIGH (ref 70–99)

## 2022-12-25 NOTE — Progress Notes (Addendum)
ORRIE, SCHUBERT ([id number]) [id number] Page 1 of 2 Visit Report for 12/20/2022 Problem List Details Patient Name: Date of Service: Jose [name], Jose [name] MES D. 12/20/2022 12:00 PM Medical Record Number: [id number] Patient Account Number: [id number] Date of Birth/Sex: Treating RN: 06-22-1950 (72 y.o. 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 12/20/2022 Medical Record Number: [id number] Patient Account Number: [id number] Date of Birth/Sex: Treating RN: 1950/06/11 (71 y.o. 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 R35.0 Frequency of micturition [name], [name] ([id number]) [id number] Page 2 of 2 R30.0 Dysuria C61 Malignant neoplasm of prostate Facility Procedures : CPT4 Code Description: [id number] G0277-(Facility Use Only) HBOT full body chamber, , ICD-10 Diagnosis Description N30.40 Irradiation cystitis  without hematuria R35.0 Frequency of micturition R30.0 Dysuria C61 Malignant neoplasm of prostate Modifier: Quantity: 4 Physician Procedures : CPT4 Code Description Modifier [id number] - WC PHYS HYPERBARIC OXYGEN THERAPY ICD-10 Diagnosis Description 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

## 2022-12-25 NOTE — Progress Notes (Addendum)
KHAMARION, LEAK [name] ([id number]) [id number] Page 1 of 3 Visit Report for 12/25/2022 HBO Details Patient Name: Date of Service: Jose [name], Jose [name]. 12/25/2022 12:00 PM Medical Record Number: [id number] Patient Account Number: [id number] Date of Birth/Sex: Treating RN: Jun 03, 1950 (72 y.o. Jose [name] Primary Care Zanovia Rotz: [name] Other Clinician: Haywood Pao Referring Auna Mikkelsen: Treating Lael Pilch/Extender: [name] in Treatment: 3 HBO Treatment Course Details Treatment Course Number: 1 Ordering Joden Bonsall: Baltazar Najjar T Treatments Ordered: otal 40 HBO Treatment Start Date: 12/17/2022 HBO Indication: Late Effect of Radiation HBO Treatment Details Treatment Number: 6 Patient Type: Outpatient Chamber Type: Monoplace Chamber Serial #: [id number] Treatment Protocol: 2.0 ATA with 90 minutes oxygen, and no air breaks Treatment Details Compression Rate Down: 1.0 psi / minute De-Compression Rate Up: 1.5 psi / minute Air breaks and breathing Decompress Decompress Compress Tx Pressure Begins Reached periods Begins Ends (leave unused spaces blank) Chamber Pressure (ATA 1 2 ------2 1 ) Clock Time (24 hr) 12:26 12:41 - - - - - - 14:11 14:23 Treatment Length: 117 (minutes) Treatment Segments: 4 Vital Signs Capillary Blood Glucose Reference Range: 80 - 120 mg / dl HBO Diabetic Blood Glucose Intervention Range: <131 mg/dl or >962 mg/dl Type: Time Vitals Blood Respiratory Capillary Blood Glucose Pulse Action Pulse: Temperature: Taken: Pressure: Rate: Glucose (mg/dl): Meter #: Oximetry (%) Taken: Pre 11:50 190/78 62 18 98.6 145 none per protocol Post 14:26 209/101 62 18 98.6 114 re-measured BP. Post 14:28 208/88 manual BP Post 14:38 200/88 informed physician Treatment Response Treatment Toleration: Well Treatment Completion Status: Treatment Completed without Adverse Event Treatment Notes [name] arrived  with normal vital signs with the exception of systolic BP of 190 mmHg. He was asymptomatic for hypertension.He showed a list of what he ate for meals today with fried egg, bacon, grits, english muffin with butter and strawberry jam, coffee, orange juice (0830) and 1030 meal of potted meat sandwich, water, glucerna. He prepared for treatment. After performing a safety check, he was placed in the chamber which was compressed with 100% oxygen at a rate of 1.25 psi/min after confirming normal ear equalization at approximately 5 psig and increased again to 1.5 psi/min at approximately 10 psig. He tolerated said travel. Patient tolerated treatment and subsequent decompression of the chamber at a rate of 1.5 psi/min. His post-treatment vitals were normal except for BP. Initially BP was 209/101 mmHg. Using a manual cuff, BP was remeasured with result of 208/88 mmHg. His blood pressure was remeasured again after preparing for departure with result of 200/88 mmHg. Dr. Leanord Hawking informed of BP, who requested information on BP at home. Mr. Pae showed this tech the results of BP measurements at home with examples of 128/84 and 132/68. Patient was asymptomatic for hypertension and was stable upon discharge. Jose [name] Notes Treatment well well although the patient is hypertensive. I did not see him for this today I will try to talk to him on Thursday. I wonder whether he has a home blood pressure monitor. He seems anxious before treatment Physician HBO Attestation: I certify that I supervised this HBO treatment in accordance with Medicare Jose [name], Jose [name] ([id number]) [id number] Page 2 of 3 guidelines. A trained emergency response team is readily available per Yes hospital policies and procedures. Continue HBOT as ordered. Yes Electronic Signature(s) Signed: 12/25/2022 4:45:38 PM By: Baltazar Najjar MD Previous Signature: 12/25/2022 4:22:55 PM Version By: Haywood Pao CHT EMT BS ,  , Previous Signature: 12/25/2022 4:20:11 PM Version By: Haywood Pao  CHT EMT BS , , Entered By: Baltazar Najjar on 12/25/2022 16:44:01 -------------------------------------------------------------------------------- HBO Safety Checklist Details Patient Name: Date of Service: Jose [name], Jose [name]. 12/25/2022 12:00 PM Medical Record Number: [id number] Patient Account Number: [id number] Date of Birth/Sex: Treating RN: 22-Jan-1951 (72 y.o. Jose [name] Primary Care Brace Welte: [name] Other Clinician: Haywood Pao Referring Damesha Lawler: Treating Satoria Dunlop/Extender: [name] in Treatment: 3 HBO Safety Checklist Items Safety Checklist Consent Form Signed Patient voided / foley secured and emptied When did you last eato 0815/1030 Last dose of injectable or oral agent glimepiride at 0930 Ostomy pouch emptied and vented if applicable NA All implantable devices assessed, documented and approved NA Intravenous access site secured and place NA Valuables secured Linens and cotton and cotton/polyester blend (less than 51% polyester) Personal oil-based products / skin lotions / body lotions removed Wigs or hairpieces removed NA Smoking or tobacco materials removed NA Books / newspapers / magazines / loose paper removed Cologne, aftershave, perfume and deodorant removed Jewelry removed (may wrap wedding band) NA Make-up removed NA Hair care products removed Battery operated devices (external) removed Heating patches and chemical warmers removed Titanium eyewear removed Nail polish cured greater than 10 hours NA Casting material cured greater than 10 hours NA Hearing aids removed NA Loose dentures or partials removed NA Prosthetics have been removed NA Patient demonstrates correct use of air break device (if applicable) Patient concerns have been addressed Patient grounding bracelet on and cord attached to chamber Specifics for  Inpatients (complete in addition to above) Medication sheet sent with patient NA Intravenous medications needed or due during therapy sent with patient NA Drainage tubes (e.g. nasogastric tube or chest tube secured and vented) NA Endotracheal or Tracheotomy tube secured NA Cuff deflated of air and inflated with saline NA Airway suctioned NA Notes Paper version used prior to treatment start. Electronic Signature(s) Jose [name], Jose [name] ([id number]) [id number] Page 3 of 3 Signed: 12/25/2022 4:10:27 PM By: Haywood Pao CHT EMT BS , , Entered By: Haywood Pao on 12/25/2022 16:10:27

## 2022-12-25 NOTE — Progress Notes (Signed)
LUMEN, GIRARD [name] ([id number]) [id number] Page 1 of 1 Visit Report for 12/25/2022 SuperBill Details Patient Name: Date of Service: Jose [name], Jose [name]. 12/25/2022 Medical Record Number: [id number] Patient Account Number: [id number] Date of Birth/Sex: Treating RN: 19-Jun-1950 (72 y.o. 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

## 2022-12-25 NOTE — Progress Notes (Signed)
SHIZUO, GIACHETTI ([id number]) [id number] Page 1 of 1 Visit Report for 12/24/2022 SuperBill Details Patient Name: Date of Service: Jose [name], Jose [name] MES D. 12/24/2022 Medical Record Number: [id number] Patient Account Number: [id number] Date of Birth/Sex: Treating RN: 07-28-1950 (72 y.o. 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

## 2022-12-25 NOTE — Progress Notes (Signed)
NATHANYAL, MCMAHILL D ([id number]) [id number] Page 1 of 2 Visit Report for 12/25/2022 Arrival Information Details Patient Name: Date of Service: VIRGE, SOHL MES D. 12/25/2022 12:00 PM Medical Record Number: [id number] Patient Account Number: [id number] Date of Birth/Sex: Treating RN: [birth year] (72 y.o. [name] Primary Care Vannie Hochstetler: [name] Other Clinician: Haywood Pao Referring Melvin Marmo: Treating Kerrington Sova/Extender: [name] in Treatment: 3 Visit Information History Since Last Visit All ordered tests and consults were completed: Yes Patient Arrived: Ambulatory Added or deleted any medications: No Arrival Time: 11:44 Any new allergies or adverse reactions: No Accompanied By: self Had a fall or experienced change in No Transfer Assistance: None activities of daily living that may affect Patient Identification Verified: Yes risk of falls: Secondary Verification Process Completed: Yes Signs or symptoms of abuse/neglect since last visito No Patient Requires Transmission-Based Precautions: No Hospitalized since last visit: No Patient Has Alerts: No Implantable device outside of the clinic excluding No cellular tissue based products placed in the center since last visit: Pain Present Now: No Electronic Signature(s) Signed: 12/25/2022 4:08:02 PM By: Haywood Pao CHT EMT BS , , Entered By: Haywood Pao on 12/25/2022 13:08:01 -------------------------------------------------------------------------------- Encounter Discharge Information Details Patient Name: Date of Service: [name] 12/25/2022 12:00 PM Medical Record Number: [id number] Patient Account Number: [id number] Date of Birth/Sex: Treating RN: April 13, 1950 (72 y.o. [name] Primary Care Leiah Giannotti: [name] Other Clinician: Haywood Pao Referring Meshell Abdulaziz: Treating Demontay Grantham/Extender: [name] in Treatment: 3 Encounter Discharge Information Items Discharge Condition: Stable Ambulatory Status: Ambulatory Discharge Destination: Home Transportation: Private Auto Accompanied By: self Schedule Follow-up Appointment: No Clinical Summary of Care: Electronic Signature(s) Signed: 12/25/2022 4:23:43 PM By: Haywood Pao CHT EMT BS , , Entered By: Haywood Pao on 12/25/2022 13:23:43 [name] ([id number]) [id number].pdf Page 2 of 2 -------------------------------------------------------------------------------- Vitals Details Patient Name: Date of Service: DEYSHAWN, PICASSO MES D. 12/25/2022 12:00 PM Medical Record Number: [id number] Patient Account Number: [id number] Date of Birth/Sex: Treating RN: May 17, 1950 (72 y.o. [name] Primary Care Guinevere Stephenson: [name] Other Clinician: Haywood Pao Referring Issabella Rix: Treating Tiah Heckel/Extender: [name] in Treatment: 3 Vital Signs Time Taken: 11:50 Temperature (F): 98.6 Height (in): 66 Pulse (bpm): 62 Weight (lbs): 184.6 Respiratory Rate (breaths/min): 18 Body Mass Index (BMI): 29.8 Blood Pressure (mmHg): 190/78 Capillary Blood Glucose (mg/dl): 166 Reference Range: 80 - 120 mg / dl Electronic Signature(s) Signed: 12/25/2022 4:08:40 PM By: Haywood Pao CHT EMT BS , , Entered By: Haywood Pao on 12/25/2022 13:08:40

## 2022-12-26 ENCOUNTER — Encounter (HOSPITAL_BASED_OUTPATIENT_CLINIC_OR_DEPARTMENT_OTHER): Payer: Medicare Other | Admitting: Physician Assistant

## 2022-12-26 DIAGNOSIS — C61 Malignant neoplasm of prostate: Secondary | ICD-10-CM | POA: Diagnosis not present

## 2022-12-26 LAB — GLUCOSE, CAPILLARY
Glucose-Capillary: 118 mg/dL — ABNORMAL HIGH (ref 70–99)
Glucose-Capillary: 94 mg/dL (ref 70–99)
Glucose-Capillary: 106 mg/dL — ABNORMAL HIGH (ref 70–99)
Glucose-Capillary: 83 mg/dL (ref 70–99)
Glucose-Capillary: 97 mg/dL (ref 70–99)

## 2022-12-26 NOTE — Progress Notes (Signed)
GARRY, DIETL [name] ([id number]) [id number] Page 1 of 2 Visit Report for 12/26/2022 HBO Details Patient Name: Date of Service: Jose [name], Jose [name]. 12/26/2022 12:00 PM Medical Record Number: [id number] Patient Account Number: [id number] Date of Birth/Sex: Treating RN: 07-20-1950 (72 y.o. [name] Primary Care Chozen Latulippe: [name] Other Clinician: Haywood Pao Referring Iraida Cragin: Treating Aanyah Loa/Extender: Rhett Bannister Weeks in Treatment: 3 HBO Treatment Course Details Treatment Course Number: 1 Ordering Amon Costilla: Baltazar Najjar T Treatments Ordered: otal 40 HBO Treatment Start Date: 12/17/2022 HBO Indication: Late Effect of Radiation HBO Treatment Details Treatment Number: 7 Patient Type: Outpatient Chamber Type: Monoplace Chamber Serial #: [id number] Treatment Protocol: 2.0 ATA with 90 minutes oxygen, and no air breaks Treatment Details Compression Rate Down: 1.5 psi / minute De-Compression Rate Up: 2.0 psi / minute Air breaks and breathing Decompress Decompress Compress Tx Pressure Begins Reached periods Begins Ends (leave unused spaces blank) Chamber Pressure (ATA 1 2 ------2 1 ) Clock Time (24 hr) 13:01 13:16 - - - - - - 14:46 14:54 Treatment Length: 113 (minutes) Treatment Segments: 4 Vital Signs Capillary Blood Glucose Reference Range: 80 - 120 mg / dl HBO Diabetic Blood Glucose Intervention Range: <131 mg/dl or >401 mg/dl Type: Time Vitals Blood Respiratory Capillary Blood Glucose Pulse Action Pulse: Temperature: Taken: Pressure: Rate: Glucose (mg/dl): Meter #: Oximetry (%) Taken: Pre 11:51 183/80 79 18 98.6 118 none per protocol Post 14:57 18 98.1 97 Treatment Response Treatment Toleration: Well Treatment Completion Status: Treatment Completed without Adverse Event Treatment Notes Jose [name] arrived with blood pressure of 183/80. He stated that he felt fine. Blood glucose level was 118 mg/dL.  Patient had a list showing his meals for today. 0830: 1 Fried Egg, 2 Pieces of bacon, 1 bowl of grits, 1 english muffin with strawberry jam. 1045: Coffee and OJ, Lunch Dynegy, Water, Glucerna. He stated that he had just consumed a Glucerna type drink and we agreed to re-check glucose level after a short while. Secondary blood glucose level was taken at 1207 with result of 94 mg/dL. He was given 8 oz Glucerna and two orange juices, per orders. Blood glucose was remeasured at 1232 with result of 83 mg/dL. Jose [name] was informed. Blood glucose level was re-taken at 1249 with result of 106 mg/dL. After performing a safety check, patient was placed in the chamber which was compressed with 100% oxygen at a rate of 1 psi/min until reaching 6 psig, at which time patient confirmed normal ear equalization. Chamber was compressed at 1.5 psi/min until reaching approximately 12 psig. At that time, the travel rate was increased to 2 psi/min after confirming normal ear equalization. He tolerated the treatment fairly well and the subsequent decompression at the rate of 2 psi/min. He denied issues with ear equalization and/or pain associated with barotrauma. His post-treatment blood glucose level was 97 mg/dL. After discussion with Ivis Henneman patient was permitted to be discharged without extra glycemia intervention. Patient left prior to measuring post-treatment blood pressure. Followed up with phone call to patient for an update on how he was feeling. Patient stated that he felt fine and was at home with family. Electronic Signature(s) Signed: 12/26/2022 5:02:24 PM By: Haywood Pao CHT EMT BS , , Signed: 12/27/2022 8:02:01 AM By: Allen Derry PA-C Previous Signature: 12/26/2022 4:58:21 PM Version By: Haywood Pao CHT EMT BS , , Previous Signature: 12/26/2022 4:58:00 PM Version By: Haywood Pao CHT EMT BS , , Previous Signature: 12/26/2022 4:48:56 PM Version By: Flossie Dibble  [address] CHT EMT  BS , , [place], [place] [name] ([id number]) [id number] Page 2 of 2 Previous Signature: 12/26/2022 4:48:56 PM Version By: Haywood Pao CHT EMT BS , , Previous Signature: 12/26/2022 4:47:58 PM Version By: Haywood Pao CHT EMT BS , , Entered By: Haywood Pao on 12/26/2022 14:02:24 -------------------------------------------------------------------------------- HBO Safety Checklist Details Patient Name: Date of Service: Jose [name]. 12/26/2022 12:00 PM Medical Record Number: [id number] Patient Account Number: [id number] Date of Birth/Sex: Treating RN: 02-Aug-1950 (72 y.o. [name] Primary Care Sheree Lalla: [name] Other Clinician: Haywood Pao Referring Alyn Riedinger: Treating Amarie Tarte/Extender: Rhett Bannister Weeks in Treatment: 3 HBO Safety Checklist Items Safety Checklist Consent Form Signed Patient voided / foley secured and emptied When did you last eato 1045 Last dose of injectable or oral agent 0845, glimepiride Ostomy pouch emptied and vented if applicable NA All implantable devices assessed, documented and approved NA Intravenous access site secured and place NA Valuables secured Linens and cotton and cotton/polyester blend (less than 51% polyester) Personal oil-based products / skin lotions / body lotions removed Wigs or hairpieces removed NA Smoking or tobacco materials removed NA Books / newspapers / magazines / loose paper removed Cologne, aftershave, perfume and deodorant removed Jewelry removed (may wrap wedding band) Make-up removed Hair care products removed Battery operated devices (external) removed Heating patches and chemical warmers removed Titanium eyewear removed Nail polish cured greater than 10 hours NA Casting material cured greater than 10 hours NA Hearing aids removed removed Loose dentures or partials removed NA Prosthetics have been removed NA Patient demonstrates  correct use of air break device (if applicable) Patient concerns have been addressed Patient grounding bracelet on and cord attached to chamber Specifics for Inpatients (complete in addition to above) Medication sheet sent with patient NA Intravenous medications needed or due during therapy sent with patient NA Drainage tubes (e.g. nasogastric tube or chest tube secured and vented) NA Endotracheal or Tracheotomy tube secured NA Cuff deflated of air and inflated with saline NA Airway suctioned NA Notes Paper version used prior to treatment start. Patient had a list showing his meals for today. 0830: 1 Fried Egg, 2 Pieces of bacon, 1 bowl of grits, 1 english muffin with strawberry jam. 1045: Coffee and OJ, Lunch Dynegy, Water, Glucerna. Electronic Signature(s) Signed: 12/26/2022 5:01:51 PM By: Haywood Pao CHT EMT BS , , Previous Signature: 12/26/2022 4:32:13 PM Version By: Haywood Pao CHT EMT BS , , Entered By: Haywood Pao on 12/26/2022 14:01:51

## 2022-12-27 ENCOUNTER — Encounter (HOSPITAL_BASED_OUTPATIENT_CLINIC_OR_DEPARTMENT_OTHER): Payer: Medicare Other | Admitting: Internal Medicine

## 2022-12-27 DIAGNOSIS — C61 Malignant neoplasm of prostate: Secondary | ICD-10-CM | POA: Diagnosis not present

## 2022-12-27 LAB — GLUCOSE, CAPILLARY
Glucose-Capillary: 86 mg/dL (ref 70–99)
Glucose-Capillary: 72 mg/dL (ref 70–99)

## 2022-12-27 NOTE — Progress Notes (Signed)
HOSEY, HARPS ([id number]) [id number] Page 1 of 1 Visit Report for 12/26/2022 SuperBill Details Patient Name: Date of Service: Jose [name], [name] 12/26/2022 Medical Record Number: [id number] Patient Account Number: [id number] Date of Birth/Sex: Treating RN: 27-Jan-1951 (72 y.o. 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

## 2022-12-27 NOTE — Progress Notes (Signed)
MANNING, WILBANKS D ([id number]) [id number] Page 1 of 2 Visit Report for 12/26/2022 Arrival Information Details Patient Name: Date of Service: FRANKO, ATHAS MES D. 12/26/2022 12:00 PM Medical Record Number: [id number] Patient Account Number: [id number] Date of Birth/Sex: Treating RN: 1950/10/20 (72 y.o. [name] Primary Care Jettie Mannor: [name] Other Clinician: Haywood Pao Referring Arnesia Vincelette: Treating Shellene Sweigert/Extender: Rhett Bannister Weeks in Treatment: 3 Visit Information History Since Last Visit All ordered tests and consults were completed: Yes Patient Arrived: Ambulatory Added or deleted any medications: No Arrival Time: 11:47 Any new allergies or adverse reactions: No Accompanied By: self Had a fall or experienced change in No Transfer Assistance: None activities of daily living that may affect Patient Identification Verified: Yes risk of falls: Secondary Verification Process Completed: Yes Signs or symptoms of abuse/neglect since last visito No Patient Requires Transmission-Based Precautions: No Hospitalized since last visit: No Patient Has Alerts: No Implantable device outside of the clinic excluding No cellular tissue based products placed in the center since last visit: Pain Present Now: No Electronic Signature(s) Signed: 12/26/2022 4:27:40 PM By: Haywood Pao CHT EMT BS , , Entered By: Haywood Pao on 12/26/2022 13:27:40 -------------------------------------------------------------------------------- Encounter Discharge Information Details Patient Name: Date of Service: [name] 12/26/2022 12:00 PM Medical Record Number: [id number] Patient Account Number: [id number] Date of Birth/Sex: Treating RN: [birth year] (72 y.o. [name] Primary Care Ramey Ketcherside: [name] Other Clinician: Haywood Pao Referring Lakeysha Slutsky: Treating Arra Connaughton/Extender: Metta Clines in Treatment: 3 Encounter Discharge Information Items Discharge Condition: Stable Ambulatory Status: Ambulatory Discharge Destination: Home Transportation: Private Auto Accompanied By: self Schedule Follow-up Appointment: No Clinical Summary of Care: Electronic Signature(s) Signed: 12/26/2022 4:59:16 PM By: Haywood Pao CHT EMT BS , , Entered By: Haywood Pao on 12/26/2022 13:59:15 Marvis Repress D ([id number]) [phone].pdf Page 2 of 2 -------------------------------------------------------------------------------- Vitals Details Patient Name: Date of Service: BRAYDYN, DELAROCHA MES D. 12/26/2022 12:00 PM Medical Record Number: [id number] Patient Account Number: [id number] Date of Birth/Sex: Treating RN: 1950-08-03 (72 y.o. [name] Primary Care Dondi Aime: [name] Other Clinician: Haywood Pao Referring Annie Saephan: Treating Kristoph Sattler/Extender: Rhett Bannister Weeks in Treatment: 3 Vital Signs Time Taken: 11:51 Temperature (F): 98.6 Height (in): 66 Pulse (bpm): 79 Weight (lbs): 184.6 Respiratory Rate (breaths/min): 18 Body Mass Index (BMI): 29.8 Blood Pressure (mmHg): 183/80 Capillary Blood Glucose (mg/dl): 433 Reference Range: 80 - 120 mg / dl Electronic Signature(s) Signed: 12/26/2022 4:30:05 PM By: Haywood Pao CHT EMT BS , , Entered By: Haywood Pao on 12/26/2022 13:30:04

## 2022-12-28 ENCOUNTER — Encounter (HOSPITAL_BASED_OUTPATIENT_CLINIC_OR_DEPARTMENT_OTHER): Payer: Medicare Other | Admitting: General Surgery

## 2022-12-28 DIAGNOSIS — C61 Malignant neoplasm of prostate: Secondary | ICD-10-CM | POA: Diagnosis not present

## 2022-12-28 LAB — GLUCOSE, CAPILLARY
Glucose-Capillary: 110 mg/dL — ABNORMAL HIGH (ref 70–99)
Glucose-Capillary: 98 mg/dL (ref 70–99)

## 2022-12-28 NOTE — Progress Notes (Signed)
INNIS, CAMIRE D ([id number]) [id number] Page 1 of 2 Visit Report for 12/27/2022 Arrival Information Details Patient Name: Date of Service: KEYLER, GRIGSBY MES D. 12/27/2022 12:00 PM Medical Record Number: [id number] Patient Account Number: [id number] Date of Birth/Sex: Treating RN: 1950/09/26 (72 y.o. [name] Primary Care Bona Hubbard: [name] Other Clinician: Haywood Pao Referring Makyiah Lie: Treating Elena Cothern/Extender: [name] in Treatment: 3 Visit Information History Since Last Visit All ordered tests and consults were completed: Yes Patient Arrived: Ambulatory Added or deleted any medications: No Arrival Time: 11:51 Any new allergies or adverse reactions: No Accompanied By: spouse Had a fall or experienced change in No Transfer Assistance: None activities of daily living that may affect Patient Identification Verified: Yes risk of falls: Secondary Verification Process Completed: Yes Signs or symptoms of abuse/neglect since last visito No Patient Requires Transmission-Based Precautions: No Hospitalized since last visit: No Patient Has Alerts: No Implantable device outside of the clinic excluding No cellular tissue based products placed in the center since last visit: Pain Present Now: No Electronic Signature(s) Signed: 12/27/2022 5:00:01 PM By: Haywood Pao CHT EMT BS , , Entered By: Haywood Pao on 12/27/2022 14:00:00 -------------------------------------------------------------------------------- Encounter Discharge Information Details Patient Name: Date of Service: [name] 12/27/2022 12:00 PM Medical Record Number: [id number] Patient Account Number: [id number] Date of Birth/Sex: Treating RN: [birth year] (72 y.o. [name] Primary Care Racquelle Hyser: [name] Other Clinician: Haywood Pao Referring Zhuri Krass: Treating Jillisa Harris/Extender: [name] in Treatment: 3 Encounter Discharge Information Items Discharge Condition: Stable Ambulatory Status: Ambulatory Discharge Destination: Home Transportation: Private Auto Accompanied By: spouse Schedule Follow-up Appointment: No Clinical Summary of Care: Electronic Signature(s) Signed: 12/27/2022 5:49:12 PM By: Haywood Pao CHT EMT BS , , Entered By: Haywood Pao on 12/27/2022 14:49:12 Marvis Repress D ([id number]) [phone].pdf Page 2 of 2 -------------------------------------------------------------------------------- Vitals Details Patient Name: Date of Service: OLIVA, BERDAN MES D. 12/27/2022 12:00 PM Medical Record Number: [id number] Patient Account Number: [id number] Date of Birth/Sex: Treating RN: 30-Nov-1950 (72 y.o. [name] Primary Care Revere Maahs: [name] Other Clinician: Karl Bales Referring Leib Elahi: Treating Ajeet Casasola/Extender: [name] in Treatment: 3 Vital Signs Time Taken: 12:09 Temperature (F): 97.9 Height (in): 66 Pulse (bpm): 63 Weight (lbs): 184.6 Respiratory Rate (breaths/min): 18 Body Mass Index (BMI): 29.8 Blood Pressure (mmHg): 175/77 Capillary Blood Glucose (mg/dl): 86 Reference Range: 80 - 120 mg / dl Electronic Signature(s) Signed: 12/27/2022 5:00:48 PM By: Haywood Pao CHT EMT BS , , Entered By: Haywood Pao on 12/27/2022 14:00:48

## 2022-12-28 NOTE — Progress Notes (Signed)
TOBIN, ROGALSKI D ([id number]) [id number] Page 1 of 2 Visit Report for 12/28/2022 HBO Details Patient Name: Date of Service: Jose, VIEYRA MES D. 12/28/2022 9:30 A M Medical Record Number: [id number] Patient Account Number: [id number] Date of Birth/Sex: Treating RN: [birth year] (72 y.o. Jose [name], [name] Primary Care Nneka Blanda: [name] Other Clinician: Haywood Pao Referring Nekayla Heider: Treating Courtnee Myer/Extender: Delena Bali Weeks in Treatment: 3 HBO Treatment Course Details Treatment Course Number: 1 Ordering Bracy Pepper: Baltazar Najjar T Treatments Ordered: otal 40 HBO Treatment Start Date: 12/17/2022 HBO Indication: Late Effect of Radiation HBO Treatment Details Treatment Number: 9 Patient Type: Outpatient Chamber Type: Monoplace Chamber Serial #: [id number] Treatment Protocol: 2.0 ATA with 90 minutes oxygen, and no air breaks Treatment Details Compression Rate Down: 1.0 psi / minute De-Compression Rate Up: 1.5 psi / minute Air breaks and breathing Decompress Decompress Compress Tx Pressure Begins Reached periods Begins Ends (leave unused spaces blank) Chamber Pressure (ATA 1 2 ------2 1 ) Clock Time (24 hr) 09:38 09:50 - - - - - - 11:20 11:30 Treatment Length: 112 (minutes) Treatment Segments: 4 Vital Signs Capillary Blood Glucose Reference Range: 80 - 120 mg / dl HBO Diabetic Blood Glucose Intervention Range: <131 mg/dl or >308 mg/dl Type: Time Vitals Blood Pulse: Respiratory Temperature: Capillary Blood Glucose Pulse Action Taken: Pressure: Rate: Glucose (mg/dl): Meter #: Oximetry (%) Taken: Pre 09:19 187/83 70 18 98 110 informed physician, see note Post 11:30 204/78 55 18 98.6 informed physician, see note. Treatment Response Treatment Toleration: Well Treatment Completion Status: Treatment Completed without Adverse Event Treatment Notes Jose [name] arrived with blood pressure of 187/83 mmHg. His blood glucose  was 110 mg/dL. Per verbal physician order from Dr. Leanord Hawking from 12/27/2022, glycemic intervention protocol is modified. Due to the medication, glimepiride, patient is able to successfully manage his diabetes so as to require tremendous intervention to increase CBG to levels required by glycemic intervention protocol. Patient sent with 8 oz juice for safety/intervention in the chamber. Dr. Lady Gary agreed with this plan. After performing a safety check, patient was placed in the chamber which was compressed with 100% oxygen at a rate of 1.5 psi/min after confirmation of normal ear equalization. He tolerated the treatment and subsequent decompression at the rate of 1.5 psi/min. He did drink a small amount of his apple juice in the chamber. His blood glucose level was 98 mg/dL. Patient asymptomatic for hypoglycemia, stating that he felt okay. His blood pressure was 204/78 mmHg. Dr. Lady Gary was informed and wanted Jose [name] to speak to his physician about his high blood pressures. Patient agreed. He was stable upon discharge. Physician HBO Attestation: I certify that I supervised this HBO treatment in accordance with Medicare guidelines. A trained emergency response team is readily available per Yes hospital policies and procedures. Continue HBOT as ordered. [address] HAYDAN, KAPLA ([id number]) [id number] Page 2 of 2 Signed: 12/31/2022 8:07:50 AM By: Duanne Guess MD FACS Previous Signature: 12/28/2022 12:35:40 PM Version By: Haywood Pao CHT EMT BS , , Entered By: Duanne Guess on 12/31/2022 05:07:50 -------------------------------------------------------------------------------- HBO Safety Checklist Details Patient Name: Date of Service: Jose Sousa MES D. 12/28/2022 9:30 A M Medical Record Number: [id number] Patient Account Number: [id number] Date of Birth/Sex: Treating RN: 10-17-1950 (72 y.o. [name] Primary Care Jeny Nield: [name] Other Clinician: Haywood Pao Referring Jaymison Luber: Treating Darian Ace/Extender: Delena Bali Weeks in Treatment: 3 HBO Safety Checklist Items Safety Checklist Consent Form Signed Patient voided / foley secured  and emptied When did you last eato Listed Breakfast Last dose of injectable or oral agent 0730 Ostomy pouch emptied and vented if applicable NA All implantable devices assessed, documented and approved NA Intravenous access site secured and place NA Valuables secured Linens and cotton and cotton/polyester blend (less than 51% polyester) Personal oil-based products / skin lotions / body lotions removed Wigs or hairpieces removed NA Smoking or tobacco materials removed NA Books / newspapers / magazines / loose paper removed Cologne, aftershave, perfume and deodorant removed Jewelry removed (may wrap wedding band) Make-up removed NA Hair care products removed Battery operated devices (external) removed Heating patches and chemical warmers removed Titanium eyewear removed Nail polish cured greater than 10 hours NA Casting material cured greater than 10 hours NA Hearing aids removed at home Loose dentures or partials removed NA Prosthetics have been removed NA Patient demonstrates correct use of air break device (if applicable) Patient concerns have been addressed Patient grounding bracelet on and cord attached to chamber Specifics for Inpatients (complete in addition to above) Medication sheet sent with patient NA Intravenous medications needed or due during therapy sent with patient NA Drainage tubes (e.g. nasogastric tube or chest tube secured and vented) NA Endotracheal or Tracheotomy tube secured NA Cuff deflated of air and inflated with saline NA Airway suctioned NA Notes Paper version used prior to treatment start. Patient had a list showing his meals for today. Electronic Signature(s) Signed: 12/28/2022 12:19:15  PM By: Haywood Pao CHT EMT BS , , Entered By: Haywood Pao on 12/28/2022 09:19:15

## 2022-12-28 NOTE — Progress Notes (Signed)
VALLEY, MENELEY D ([id number]) [id number] Page 1 of 2 Visit Report for 12/28/2022 Arrival Information Details Patient Name: Date of Service: JATERRIUS, GERLITZ MES D. 12/28/2022 9:30 A M Medical Record Number: [id number] Patient Account Number: [id number] Date of Birth/Sex: Treating RN: 05-Jan-1951 (73 y.o. [name], [name] Primary Care Jimmi Sidener: [name] Other Clinician: Haywood Pao Referring Lady Wisham: Treating Karlis Cregg/Extender: Delena Bali Weeks in Treatment: 3 Visit Information History Since Last Visit All ordered tests and consults were completed: Yes Patient Arrived: Ambulatory Added or deleted any medications: No Arrival Time: 09:12 Any new allergies or adverse reactions: No Accompanied By: self Had a fall or experienced change in No Transfer Assistance: None activities of daily living that may affect Patient Identification Verified: Yes risk of falls: Secondary Verification Process Completed: Yes Signs or symptoms of abuse/neglect since last visito No Patient Requires Transmission-Based Precautions: No Hospitalized since last visit: No Patient Has Alerts: No Implantable device outside of the clinic excluding No cellular tissue based products placed in the center since last visit: Pain Present Now: No Electronic Signature(s) Signed: 12/28/2022 12:16:22 PM By: Haywood Pao CHT EMT BS , , Entered By: Haywood Pao on 12/28/2022 12:16:22 -------------------------------------------------------------------------------- Encounter Discharge Information Details Patient Name: Date of Service: [name] 12/28/2022 9:30 A M Medical Record Number: [id number] Patient Account Number: [id number] Date of Birth/Sex: Treating RN: Mar 30, 1950 (72 y.o. [name] Primary Care Chanika Byland: [name] Other Clinician: Haywood Pao Referring Fayne Mcguffee: Treating Clemente Dewey/Extender: Sigmund Hazel in Treatment: 3 Encounter Discharge Information Items Discharge Condition: Stable Ambulatory Status: Ambulatory Discharge Destination: Home Transportation: Private Auto Accompanied By: self Schedule Follow-up Appointment: No Clinical Summary of Care: Electronic Signature(s) Signed: 12/28/2022 12:36:31 PM By: Haywood Pao CHT EMT BS , , Entered By: Haywood Pao on 12/28/2022 12:36:31 [name] ([id number]) [phone].pdf Page 2 of 2 -------------------------------------------------------------------------------- Vitals Details Patient Name: Date of Service: SEATON, OAKLEY MES D. 12/28/2022 9:30 A M Medical Record Number: [id number] Patient Account Number: [id number] Date of Birth/Sex: Treating RN: 05/13/1950 (72 y.o. [name], [name] Primary Care Saharah Sherrow: [name] Other Clinician: Haywood Pao Referring Jonah Nestle: Treating Imraan Wendell/Extender: Delena Bali Weeks in Treatment: 3 Vital Signs Time Taken: 09:19 Temperature (F): 98.0 Height (in): 66 Pulse (bpm): 70 Weight (lbs): 184.6 Respiratory Rate (breaths/min): 18 Body Mass Index (BMI): 29.8 Blood Pressure (mmHg): 187/83 Capillary Blood Glucose (mg/dl): 601 Reference Range: 80 - 120 mg / dl Electronic Signature(s) Signed: 12/28/2022 12:16:46 PM By: Haywood Pao CHT EMT BS , , Entered By: Haywood Pao on 12/28/2022 12:16:46

## 2022-12-28 NOTE — Progress Notes (Signed)
CINDY, AUSTGEN ([id number]) [id number] Page 1 of 1 Visit Report for 12/27/2022 SuperBill Details Patient Name: Date of Service: Jose [name], Jose [name]. 12/27/2022 Medical Record Number: [id number] Patient Account Number: [id number] Date of Birth/Sex: Treating RN: 03/10/1950 (72 y.o. 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

## 2022-12-28 NOTE — Progress Notes (Addendum)
DONTRAVIOUS, DONIA D ([id number]) [id number] Page 1 of 2 Visit Report for 12/27/2022 HBO Details Patient Name: Date of Service: Jose [name], Jose [name] MES D. 12/27/2022 12:00 PM Medical Record Number: [id number] Patient Account Number: [id number] Date of Birth/Sex: Treating RN: [birth year] (72 y.o. Jose [name] Primary Care Jose [name]: Jose [name] Other Clinician: Haywood [name] Referring Jose [name]: Treating Jose [name]/Extender: Jose [name] in Treatment: 3 HBO Treatment Course Details Treatment Course Number: 1 Ordering Jose [name]: Jose [name] T Treatments Ordered: otal 40 HBO Treatment Start Date: 12/17/2022 HBO Indication: Late Effect of Radiation HBO Treatment Details Treatment Number: 8 Patient Type: Outpatient Chamber Type: Monoplace Chamber Serial #: [id number] Treatment Protocol: 2.0 ATA with 90 minutes oxygen, and no air breaks Treatment Details Compression Rate Down: 1.0 psi / minute De-Compression Rate Up: 2.5 psi / minute Air breaks and breathing Decompress Decompress Compress Tx Pressure Begins Reached periods Begins Ends (leave unused spaces blank) Chamber Pressure (ATA 1 2 ------2 1 ) Clock Time (24 hr) 12:46 13:03 - - - - - - 13:39 13:50 Treatment Length: 64 (minutes) Treatment Segments: 2 Vital Signs Capillary Blood Glucose Reference Range: 80 - 120 mg / dl HBO Diabetic Blood Glucose Intervention Range: <131 mg/dl or >962 mg/dl Type: Time Vitals Blood Pulse: Respiratory Capillary Blood Glucose Pulse Action Temperature: Taken: Pressure: Rate: Glucose (mg/dl): Meter #: Oximetry (%) Taken: Pre 12:09 175/77 63 18 97.9 86 informed physician of CBG Post 14:01 213/89 61 18 97.8 72 Patient drank 8 oz OJ Treatment Response Treatment Toleration: Well Treatment Completion Status: Treatment Completed without Adverse Event Treatment Notes Jose [name] arrived with blood pressure of normal vital signs per protocol. His  blood glucose level was 86 mg/dL. Patient had a list showing his meals for today. 0845 Scrambled Eggs, 1 bacon strip, english muffin with strawberry jam, OJ, Coffee, Premier Protein Shake 1045: [place] Sausage 1100: Glucerna. Informed Dr. Leanord [name] of CBG. Due to the medication, glimepiride, patient is able to successfully manage his diabetes so as to require tremendous intervention to increase CBG to levels required by glycemic intervention protocol. Per Dr. Jannetta [name] orders today patient was allowed to undergo treatment and an 8 oz orange juice was sent in with patient for safety/intervention in the chamber. Patient stated that he was having some ear/sinus stuffiness today, so he was given and self-administered Afrin. After performing a safety check, the patient was placed in the chamber, which was compressed with 100% oxygen at a rate of 1.5 psi/min after confirming normal ear equalization (approximately 10 psig). He tolerated the treatment until a fire alarm caused evacuation. Decompression of the chamber occurred at the rate of 2.5 psi/min. Patient denied any issues with ear equalization and/or pain caused by barotrauma. He was stable upon discharge with spouse. Jose [name] Notes No concerns with treatment given. Once again presenting blood sugar is reasonably low however the patient is asymptomatic. He does not become hypoglycemic and HBO Electronic Signature(s) Signed: 12/28/2022 12:59:16 PM By: Jose Najjar MD Previous Signature: 12/27/2022 5:36:44 PM Version By: Jose [name] CHT EMT BS , , Jose [name] ([id number]) [id number] Page 2 of 2 Previous Signature: 12/27/2022 5:36:44 PM Version By: Jose [name] CHT EMT BS , , Entered By: Jose [name] on 12/28/2022 09:57:14 -------------------------------------------------------------------------------- HBO Safety Checklist Details Patient Name: Date of Service: Jose [name], Jose [name] MES D. 12/27/2022 12:00 PM Medical Record  Number: [id number] Patient Account Number: [id number] Date of Birth/Sex: Treating RN: 16-Mar-1950 (72 y.o. Jose [name] Primary Care Jose [name]: Jose [name] Other Clinician: Haywood [name]  Referring Jose [name]: Treating Jose [name]/Extender: Jose [name] in Treatment: 3 HBO Safety Checklist Items Safety Checklist Consent Form Signed Patient voided / foley secured and emptied When did you last eato 1045 Last dose of injectable or oral agent 0830 Ostomy pouch emptied and vented if applicable NA All implantable devices assessed, documented and approved NA Intravenous access site secured and place NA Valuables secured Linens and cotton and cotton/polyester blend (less than 51% polyester) Personal oil-based products / skin lotions / body lotions removed Wigs or hairpieces removed NA Smoking or tobacco materials removed NA Books / newspapers / magazines / loose paper removed Cologne, aftershave, perfume and deodorant removed Jewelry removed (may wrap wedding band) Make-up removed Hair care products removed Battery operated devices (external) removed Heating patches and chemical warmers removed NA Titanium eyewear removed NA Nail polish cured greater than 10 hours NA Casting material cured greater than 10 hours NA Hearing aids removed at home Loose dentures or partials removed NA Prosthetics have been removed NA Patient demonstrates correct use of air break device (if applicable) Patient concerns have been addressed Patient grounding bracelet on and cord attached to chamber Specifics for Inpatients (complete in addition to above) Medication sheet sent with patient NA Intravenous medications needed or due during therapy sent with patient NA Drainage tubes (e.g. nasogastric tube or chest tube secured and vented) NA Endotracheal or Tracheotomy tube secured NA Cuff deflated of air and inflated with saline NA Airway  suctioned NA Notes Paper version used prior to treatment start. Patient had a list showing his meals for today. 0845 Scrambled Eggs, 1 bacon strip, english muffin with strawberry jam, OJ, Coffee, Premier Protein Shake 1045: [place] Sausage 1100: Glucerna Electronic Signature(s) Signed: 12/27/2022 6:17:47 PM By: Jose [name] CHT EMT BS , , Previous Signature: 12/27/2022 5:10:52 PM Version By: Jose [name] CHT EMT BS , , Entered By: Jose [name] on 12/27/2022 15:17:47

## 2022-12-31 ENCOUNTER — Encounter (HOSPITAL_BASED_OUTPATIENT_CLINIC_OR_DEPARTMENT_OTHER): Payer: Medicare Other | Admitting: General Surgery

## 2022-12-31 DIAGNOSIS — C61 Malignant neoplasm of prostate: Secondary | ICD-10-CM | POA: Diagnosis not present

## 2022-12-31 LAB — GLUCOSE, CAPILLARY
Glucose-Capillary: 161 mg/dL — ABNORMAL HIGH (ref 70–99)
Glucose-Capillary: 103 mg/dL — ABNORMAL HIGH (ref 70–99)

## 2022-12-31 NOTE — Progress Notes (Signed)
VIREN, SCHENONE [name] ([id number]) [id number] Page 1 of 2 Visit Report for 12/31/2022 Arrival Information Details Patient Name: Date of Service: Jose [name], Jose [name]. 12/31/2022 10:00 A M Medical Record Number: [id number] Patient Account Number: [id number] Date of Birth/Sex: Treating RN: Apr 12, 1950 (72 y.o. [name], [name] Primary Care Shalom Mcguiness: [name] Other Clinician: Haywood Pao Referring Nivedita Mirabella: Treating Kashena Novitski/Extender: [name] in Treatment: 3 Visit Information History Since Last Visit All ordered tests and consults were completed: Yes Patient Arrived: Ambulatory Added or deleted any medications: No Arrival Time: 09:16 Any new allergies or adverse reactions: No Accompanied By: self Had a fall or experienced change in No Transfer Assistance: None activities of daily living that may affect Patient Identification Verified: Yes risk of falls: Secondary Verification Process Completed: Yes Signs or symptoms of abuse/neglect since last visito No Patient Requires Transmission-Based Precautions: No Hospitalized since last visit: No Patient Has Alerts: No Implantable device outside of the clinic excluding No cellular tissue based products placed in the center since last visit: Pain Present Now: No Electronic Signature(s) Signed: 12/31/2022 1:16:41 PM By: Haywood Pao CHT EMT BS , , Entered By: Haywood Pao on 12/31/2022 10:16:41 -------------------------------------------------------------------------------- Encounter Discharge Information Details Patient Name: Date of Service: Jose [name]. 12/31/2022 10:00 A M Medical Record Number: [id number] Patient Account Number: [id number] Date of Birth/Sex: Treating RN: 03-Sep-1950 (72 y.o. [name] Primary Care Tag Wurtz: [name] Other Clinician: Haywood Pao Referring Thomos Domine: Treating Deane Wattenbarger/Extender: [name] in Treatment: 3 Encounter Discharge Information Items Discharge Condition: Stable Ambulatory Status: Ambulatory Discharge Destination: Home Transportation: Private Auto Accompanied By: self Schedule Follow-up Appointment: No Clinical Summary of Care: Electronic Signature(s) Signed: 12/31/2022 1:49:58 PM By: Haywood Pao CHT EMT BS , , Entered By: Haywood Pao on 12/31/2022 10:49:58 [name] ([id number]) [id number] Page 2 of 2 -------------------------------------------------------------------------------- Vitals Details Patient Name: Date of Service: Jose [name], Jose [name]. 12/31/2022 10:00 A M Medical Record Number: [id number] Patient Account Number: [id number] Date of Birth/Sex: Treating RN: 1950/04/06 (72 y.o. [name] Primary Care Alenah Sarria: [name] Other Clinician: Haywood Pao Referring Skyler Carel: Treating Malori Myers/Extender: [name] in Treatment: 3 Vital Signs Time Taken: 09:34 Temperature (F): 97.5 Height (in): 66 Pulse (bpm): 68 Weight (lbs): 184.6 Respiratory Rate (breaths/min): 18 Body Mass Index (BMI): 29.8 Blood Pressure (mmHg): 198/75 Capillary Blood Glucose (mg/dl): 106 Reference Range: 80 - 120 mg / dl Electronic Signature(s) Signed: 12/31/2022 1:17:36 PM By: Haywood Pao CHT EMT BS , , Entered By: Haywood Pao on 12/31/2022 10:17:35

## 2022-12-31 NOTE — Progress Notes (Signed)
ORAS, SPLITT [name] ([id number]) [id number] Page 1 of 2 Visit Report for 12/28/2022 Physician Orders Details Patient Name: Date of Service: Jose [name], Jose [name]. 12/28/2022 9:30 A M Medical Record Number: [id number] Patient Account Number: [id number] Date of Birth/Sex: Treating RN: 1950-05-06 (72 y.o. [name] Primary Care Provider: [name] Other Clinician: Haywood Pao Referring Provider: Treating Provider/Extender: Delena Bali Weeks in Treatment: 3 The following information was scribed by: Shawn Stall The information was scribed for: Baltazar Najjar Verbal / Phone Orders: No Diagnosis Coding Follow-up Appointments Other: - needs 30 day eval once HBO begins. We will call you to schedule your HBO once insurance approves. Hyperbaric Oxygen Therapy Evaluate for HBO Therapy - 40 treatments Indication: - Radiation cystitis If appropriate for treatment, begin HBOT per protocol: 2.0 ATA for 90 Minutes without A Breaks ir Total Number of Treatments: - 40 One treatments per day (delivered Monday through Friday unless otherwise specified in Special Instructions below): Finger stick Blood Glucose Pre- and Post- HBOT Treatment. A frin (Oxymetazoline HCL) 0.05% nasal spray - 1 spray in both nostrils daily as needed prior to HBO treatment for difficulty clearing ears Electronic Signature(s) Signed: 12/28/2022 2:17:02 PM By: Shawn Stall RN, BSN Signed: 12/31/2022 8:10:17 AM By: Duanne Guess MD FACS Entered By: Shawn Stall on 12/28/2022 09:38:33 -------------------------------------------------------------------------------- SuperBill Details Patient Name: Date of Service: Jose [name] MES [name]. 12/28/2022 Medical Record Number: [id number] Patient Account Number: [id number] Date of Birth/Sex: Treating RN: 23-Mar-1950 (72 y.o. 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

## 2022-12-31 NOTE — Progress Notes (Signed)
[name], [name] [name] ([id number]) [id number] Page 1 of 2 Visit Report for 12/31/2022 HBO Details Patient Name: Date of Service: Jose [name], Jose [name]. 12/31/2022 10:00 A M Medical Record Number: [id number] Patient Account Number: [id number] Date of Birth/Sex: Treating RN: 04-Jun-1950 (72 y.o. Jose [name] Primary Care Jose [name]: Jose [name] Other Clinician: Haywood [name] Referring Jose [name]: Treating Jose [name]/Extender: Jose [name] in Treatment: 3 HBO Treatment Course Details Treatment Course Number: 1 Ordering Jose [name]: Jose [name] T Treatments Ordered: otal 40 HBO Treatment Start Date: 12/17/2022 HBO Indication: Late Effect of Radiation HBO Treatment Details Treatment Number: 10 Patient Type: Outpatient Chamber Type: Monoplace Chamber Serial #: [id number] Treatment Protocol: 2.0 ATA with 90 minutes oxygen, and no air breaks Treatment Details Compression Rate Down: 1.5 psi / minute De-Compression Rate Up: 1.5 psi / minute Air breaks and breathing Decompress Decompress Compress Tx Pressure Begins Reached periods Begins Ends (leave unused spaces blank) Chamber Pressure (ATA 1 2 ------2 1 ) Clock Time (24 hr) 10:22 10:34 - - - - - - 12:04 12:14 Treatment Length: 112 (minutes) Treatment Segments: 4 Vital Signs Capillary Blood Glucose Reference Range: 80 - 120 mg / dl HBO Diabetic Blood Glucose Intervention Range: <131 mg/dl or >102 mg/dl Type: Time Vitals Blood Pulse: Respiratory Temperature: Capillary Blood Glucose Pulse Action Taken: Pressure: Rate: Glucose (mg/dl): Meter #: Oximetry (%) Taken: Pre 09:34 198/75 68 18 97.5 161 denies symptoms of hypertension Post 12:17 212/87 57 18 97.8 103 denies symptoms of hypertension Treatment Response Treatment Toleration: Well Treatment Completion Status: Treatment Completed without Adverse Event Treatment Notes Mr. Jose [name] arrived with BP of 198/75 mmHg. Patient denies any  symptoms related to hypertension. Blood glucose level was 161 mg/dL. He ate breakfast today. He prepared for treatment. After performing a safety check, patient was placed in the chamber which was compressed with 100% oxygen at a rate of 1.8 psi/min after confirmation of normal ear equalization. He tolerated the treatment and subsequent decompression at the rate of 1.8 psi/min. His post-treatment BP was still high at 212/87 mmHg. He tracks his blood pressure at home and shows blood pressure readings below 140 mmHg systolic. Heart rate was below 60 bpm, but is asymptomatic for bradycardia. He was stable upon discharge. Jose [name] Notes No concerns with treatment given Physician HBO Attestation: I certify that I supervised this HBO treatment in accordance with Medicare guidelines. A trained emergency response team is readily available per [facility] hospital policies and procedures. Continue HBOT as ordered. [address] Jose [name], Jose [name] ([id number]) [id number] Page 2 of 2 Signed: 01/01/2023 8:53:04 AM By: Jose Najjar MD Previous Signature: 12/31/2022 1:28:41 PM Version By: Jose [name] CHT EMT BS , , Entered By: Jose [name] on 12/31/2022 13:42:56 -------------------------------------------------------------------------------- HBO Safety Checklist Details Patient Name: Date of Service: Jose [name]. 12/31/2022 10:00 A M Medical Record Number: [id number] Patient Account Number: [id number] Date of Birth/Sex: Treating RN: [birth year] (72 y.o. Jose [name] Primary Care Jose [name]: Jose [name] Other Clinician: Haywood [name] Referring Jose [name]: Treating Jose [name]/Extender: Jose [name] in Treatment: 3 HBO Safety Checklist Items Safety Checklist Consent Form Signed Patient voided / foley secured and emptied 0700 - Egg, bacon, buttered toast, oj, When did you last eato glucerna, OJ 0815 Last dose of injectable  or oral agent 0715 - glimepiride Ostomy pouch emptied and vented if applicable NA All implantable devices assessed, documented and approved NA Intravenous access site secured and place NA Valuables secured Linens and cotton and cotton/polyester blend (  less than 51% polyester) Personal oil-based products / skin lotions / body lotions removed Wigs or hairpieces removed NA Smoking or tobacco materials removed NA Books / newspapers / magazines / loose paper removed Cologne, aftershave, perfume and deodorant removed Jewelry removed (may wrap wedding band) Make-up removed NA Hair care products removed Battery operated devices (external) removed Heating patches and chemical warmers removed Titanium eyewear removed Nail polish cured greater than 10 hours NA Casting material cured greater than 10 hours NA Hearing aids removed NA Loose dentures or partials removed NA Prosthetics have been removed NA Patient demonstrates correct use of air break device (if applicable) Patient concerns have been addressed Patient grounding bracelet on and cord attached to chamber Specifics for Inpatients (complete in addition to above) Medication sheet sent with patient NA Intravenous medications needed or due during therapy sent with patient NA Drainage tubes (e.g. nasogastric tube or chest tube secured and vented) NA Endotracheal or Tracheotomy tube secured NA Cuff deflated of air and inflated with saline NA Airway suctioned NA Notes Paper version used prior to treatment start. Electronic Signature(s) Signed: 12/31/2022 1:20:43 PM By: Jose [name] CHT EMT BS , , Entered By: Jose [name] on 12/31/2022 10:20:43

## 2023-01-01 ENCOUNTER — Other Ambulatory Visit: Payer: Self-pay | Admitting: Internal Medicine

## 2023-01-01 ENCOUNTER — Encounter: Payer: Self-pay | Admitting: Internal Medicine

## 2023-01-01 ENCOUNTER — Encounter (HOSPITAL_BASED_OUTPATIENT_CLINIC_OR_DEPARTMENT_OTHER): Payer: Medicare Other | Admitting: General Surgery

## 2023-01-01 DIAGNOSIS — C61 Malignant neoplasm of prostate: Secondary | ICD-10-CM | POA: Diagnosis not present

## 2023-01-01 LAB — GLUCOSE, CAPILLARY
Glucose-Capillary: 97 mg/dL (ref 70–99)
Glucose-Capillary: 79 mg/dL (ref 70–99)

## 2023-01-01 NOTE — Progress Notes (Signed)
LOWEN, WIRTANEN [name] ([id number]) [id number] Page 1 of 2 Visit Report for 01/01/2023 HBO Details Patient Name: Date of Service: Jose [name], Jose [name]. 01/01/2023 10:00 A M Medical Record Number: [id number] Patient Account Number: [id number] Date of Birth/Sex: Treating RN: 02-19-1951 (72 y.o. [name] Primary Care Fatiha Guzy: [name] Other Clinician: Haywood Pao Referring [name]: Treating [name]/Extender: [name] in Treatment: 4 HBO Treatment Course Details Treatment Course Number: 1 Ordering Anish Vana: Baltazar Najjar T Treatments Ordered: otal 40 HBO Treatment Start Date: 12/17/2022 HBO Indication: Late Effect of Radiation HBO Treatment Details Treatment Number: 11 Patient Type: Outpatient Chamber Type: Monoplace Chamber Serial #: [id number] Treatment Protocol: 2.0 ATA with 90 minutes oxygen, and no air breaks Treatment Details Compression Rate Down: 1.5 psi / minute De-Compression Rate Up: 2.0 psi / minute Air breaks and breathing Decompress Decompress Compress Tx Pressure Begins Reached periods Begins Ends (leave unused spaces blank) Chamber Pressure (ATA 1 2 ------2 1 ) Clock Time (24 hr) 10:12 10:26 - - - - - - 11:56 12:08 Treatment Length: 116 (minutes) Treatment Segments: 4 Vital Signs Capillary Blood Glucose Reference Range: 80 - 120 mg / dl HBO Diabetic Blood Glucose Intervention Range: <131 mg/dl or >308 mg/dl Type: Time Vitals Blood Respiratory Capillary Blood Glucose Pulse Action Pulse: Temperature: Taken: Pressure: Rate: Glucose (mg/dl): Meter #: Oximetry (%) Taken: Pre 09:34 151/67 70 18 98.2 97 none per protocol Post 12:11 188/80 59 18 98.2 79 none per protocol Treatment Response Treatment Toleration: Well Treatment Completion Status: Treatment Completed without Adverse Event Treatment Notes [name] arrived with normal vital signs. Blood glucose level 97 mg/dL. See safety  checklist for meal details. He ate breakfast. After performing a safety check, patient was placed in the chamber which was compressed with 100% oxygen at a rate of 1.8 psi/min after confirmation of normal ear equalization. He tolerated the treatment and subsequent decompression at the rate of 1.8 psi/min. Post-treatment vital signs were normal. Blood glucose level was 79 mg/dL. Per Jasir Rother orders, since patient is asymptomatic for hypoglycemia, he is ready for discharge. He was stable upon discharge. [name] Notes No concern with treatment given Physician HBO Attestation: I certify that I supervised this HBO treatment in accordance with Medicare guidelines. A trained emergency response team is readily available per Yes hospital policies and procedures. Continue HBOT as ordered. Yes Electronic Signature(s) Signed: 01/01/2023 5:13:18 PM By: Baltazar Najjar MD [name] ([id number]) [id number] Page 2 of 2 Signed: 01/01/2023 5:13:18 PM By: Baltazar Najjar MD Previous Signature: 01/01/2023 1:53:00 PM Version By: Haywood Pao CHT EMT BS , , Previous Signature: 01/01/2023 12:05:24 PM Version By: Haywood Pao CHT EMT BS , , Entered By: Baltazar Najjar on 01/01/2023 17:11:03 -------------------------------------------------------------------------------- HBO Safety Checklist Details Patient Name: Date of Service: Jose [name]. 01/01/2023 10:00 A M Medical Record Number: [id number] Patient Account Number: [id number] Date of Birth/Sex: Treating RN: Oct 16, 1950 (72 y.o. [name] Primary Care [name]: [name] Other Clinician: Haywood Pao Referring [name]: Treating [name]/Extender: [name] in Treatment: 4 HBO Safety Checklist Items Safety Checklist Consent Form Signed Patient voided / foley secured and emptied When did you last eato Breakfast Last dose of injectable or oral agent  0730 Ostomy pouch emptied and vented if applicable All implantable devices assessed, documented and approved Intravenous access site secured and place Valuables secured Linens and cotton and cotton/polyester blend (less than 51% polyester) Personal oil-based products / skin lotions / body lotions  removed Wigs or hairpieces removed NA Smoking or tobacco materials removed NA Books / newspapers / magazines / loose paper removed Cologne, aftershave, perfume and deodorant removed Jewelry removed (may wrap wedding band) Make-up removed NA Hair care products removed Battery operated devices (external) removed Heating patches and chemical warmers removed Titanium eyewear removed NA Nail polish cured greater than 10 hours NA Casting material cured greater than 10 hours NA Hearing aids removed Loose dentures or partials removed NA Prosthetics have been removed NA Patient demonstrates correct use of air break device (if applicable) Patient concerns have been addressed Patient grounding bracelet on and cord attached to chamber Specifics for Inpatients (complete in addition to above) Medication sheet sent with patient NA Intravenous medications needed or due during therapy sent with patient NA Drainage tubes (e.g. nasogastric tube or chest tube secured and vented) NA Endotracheal or Tracheotomy tube secured NA Cuff deflated of air and inflated with saline NA Airway suctioned NA Notes Paper version used prior to treatment start. 0630 OJ, 0700 1 fried egg, 2 bacon slices, 1 toast with strawberry jam coffee, 0730 OJ, 0830 Glucerna. Electronic Signature(s) Signed: 01/01/2023 12:02:22 PM By: Haywood Pao CHT EMT BS , , Entered By: Haywood Pao on 01/01/2023 12:02:22

## 2023-01-01 NOTE — Progress Notes (Addendum)
BRAYAN, VOTAW D ([id number]) [id number] Page 1 of 1 Visit Report for 12/31/2022 SuperBill Details Patient Name: Date of Service: Jose [name], [name] 12/31/2022 Medical Record Number: [id number] Patient Account Number: [id number] Date of Birth/Sex: Treating RN: [birth year] (72 y.o. 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 R35.0 Frequency of micturition R30.0 Dysuria C61 Malignant neoplasm of prostate Physician Procedures Quantity CPT4 Code Description Modifier [id number] - WC PHYS HYPERBARIC OXYGEN THERAPY 1 ICD-10 Diagnosis Description 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

## 2023-01-01 NOTE — Progress Notes (Signed)
[name], Jose D ([id number]) [id number] Page 1 of 2 Visit Report for 01/01/2023 Arrival Information Details Patient Name: Date of Service: [name], [name] 01/01/2023 10:00 A M Medical Record Number: [id number] Patient Account Number: [id number] Date of Birth/Sex: Treating RN: 01-11-1951 (72 y.o. Jose [name] Primary Care Anterio Scheel: [name] Other Clinician: Haywood Pao Referring Louan Base: Treating Jamahl Lemmons/Extender: [name] in Treatment: 4 Visit Information History Since Last Visit All ordered tests and consults were completed: Yes Patient Arrived: Ambulatory Added or deleted any medications: No Arrival Time: 09:18 Any new allergies or adverse reactions: No Accompanied By: self Had a fall or experienced change in No Transfer Assistance: None activities of daily living that may affect Patient Identification Verified: Yes risk of falls: Secondary Verification Process Completed: Yes Signs or symptoms of abuse/neglect since last visito No Patient Requires Transmission-Based Precautions: No Hospitalized since last visit: No Patient Has Alerts: No Implantable device outside of the clinic excluding No cellular tissue based products placed in the center since last visit: Pain Present Now: No Electronic Signature(s) Signed: 01/01/2023 11:57:49 AM By: Haywood Pao CHT EMT BS , , Entered By: Haywood Pao on 01/01/2023 11:57:49 -------------------------------------------------------------------------------- Encounter Discharge Information Details Patient Name: Date of Service: [name] 01/01/2023 10:00 A M Medical Record Number: [id number] Patient Account Number: [id number] Date of Birth/Sex: Treating RN: 03/03/1951 (72 y.o. Jose [name] Primary Care Chiyeko Ferre: [name] Other Clinician: Haywood Pao Referring Davis Vannatter: Treating Ersie Savino/Extender: [name] in Treatment: 4 Encounter Discharge Information Items Discharge Condition: Stable Ambulatory Status: Ambulatory Discharge Destination: Home Transportation: Private Auto Accompanied By: spouse Schedule Follow-up Appointment: No Clinical Summary of Care: Electronic Signature(s) Signed: 01/01/2023 1:47:45 PM By: Haywood Pao CHT EMT BS , , Entered By: Haywood Pao on 01/01/2023 13:47:44 Marvis Repress D ([id number]) [phone].pdf Page 2 of 2 -------------------------------------------------------------------------------- Vitals Details Patient Name: Date of Service: Jose, [name] MES D. 01/01/2023 10:00 A M Medical Record Number: [id number] Patient Account Number: [id number] Date of Birth/Sex: Treating RN: [birth year] (72 y.o. Jose [name] Primary Care Ahrianna Siglin: [name] Other Clinician: Haywood Pao Referring Haidyn Kilburg: Treating Yitta Gongaware/Extender: [name] in Treatment: 4 Vital Signs Time Taken: 09:34 Temperature (F): 98.2 Height (in): 66 Pulse (bpm): 70 Weight (lbs): 184.6 Respiratory Rate (breaths/min): 18 Body Mass Index (BMI): 29.8 Blood Pressure (mmHg): 151/67 Capillary Blood Glucose (mg/dl): 97 Reference Range: 80 - 120 mg / dl Electronic Signature(s) Signed: 01/01/2023 11:58:38 AM By: Haywood Pao CHT EMT BS , , Entered By: Haywood Pao on 01/01/2023 11:58:38

## 2023-01-02 ENCOUNTER — Encounter (HOSPITAL_BASED_OUTPATIENT_CLINIC_OR_DEPARTMENT_OTHER): Payer: Medicare Other | Admitting: Physician Assistant

## 2023-01-02 DIAGNOSIS — C61 Malignant neoplasm of prostate: Secondary | ICD-10-CM | POA: Diagnosis not present

## 2023-01-02 LAB — GLUCOSE, CAPILLARY
Glucose-Capillary: 86 mg/dL (ref 70–99)
Glucose-Capillary: 83 mg/dL (ref 70–99)

## 2023-01-02 NOTE — Progress Notes (Addendum)
Jose, HENNION [name] ([id number]) [id number] Page 1 of 2 Visit Report for 01/02/2023 HBO Details Patient Name: Date of Service: Jose [name], Jose MES [name]. 01/02/2023 10:00 A M Medical Record Number: [id number] Patient Account Number: [id number] Date of Birth/Sex: Treating RN: 01/15/1951 (72 y.o. [name], [name] Primary Care Carlson Belland: [name] Other Clinician: Haywood Pao Referring Welby Montminy: Treating Ellyn Rubiano/Extender: Rhett Bannister Weeks in Treatment: 4 HBO Treatment Course Details Treatment Course Number: 1 Ordering Felis Quillin: Baltazar Najjar T Treatments Ordered: otal 40 HBO Treatment Start Date: 12/17/2022 HBO Indication: Late Effect of Radiation HBO Treatment Details Treatment Number: 12 Patient Type: Outpatient Chamber Type: Monoplace Chamber Serial #: [id number] Treatment Protocol: 2.0 ATA with 90 minutes oxygen, and no air breaks Treatment Details Compression Rate Down: 1.5 psi / minute De-Compression Rate Up: 2.0 psi / minute Air breaks and breathing Decompress Decompress Compress Tx Pressure Begins Reached periods Begins Ends (leave unused spaces blank) Chamber Pressure (ATA 1 2 ------2 1 ) Clock Time (24 hr) 09:40 09:52 - - - - - - 11:22 11:33 Treatment Length: 113 (minutes) Treatment Segments: 4 Vital Signs Capillary Blood Glucose Reference Range: 80 - 120 mg / dl HBO Diabetic Blood Glucose Intervention Range: <131 mg/dl or >132 mg/dl Type: Time Vitals Blood Respiratory Capillary Blood Glucose Pulse Action Pulse: Temperature: Taken: Pressure: Rate: Glucose (mg/dl): Meter #: Oximetry (%) Taken: Pre 09:30 126/64 74 18 98.4 86 none per protocol Post 11:36 155/84 60 18 97.9 83 none per protocol Treatment Response Treatment Toleration: Well Treatment Completion Status: Treatment Completed without Adverse Event Treatment Notes Mr. Dircks arrived with normal vital signs. Blood glucose level 86 mg/dL. He ate breakfast.  See safety checklist for meal details. After performing a safety check, patient was placed in the chamber which was compressed with 100% oxygen at a rate of 2 psi/min after confirmation of normal ear equalization. He tolerated the treatment and subsequent decompression at the rate of 2 psi/min. Post-treatment vital signs were normal. Blood glucose level was 83 mg/dL. Per Lilou Kneip orders, as patient was asymptomatic for hypoglycemia, he was ready for discharge. He prepared for departure. He was stable upon discharge. Electronic Signature(s) Signed: 01/02/2023 1:55:27 PM By: Haywood Pao CHT EMT BS , , Signed: 01/02/2023 4:33:40 PM By: Allen Derry PA-C Entered By: Haywood Pao on 01/02/2023 10:55:27 [name] ([id number]) [id number] Page 2 of 2 -------------------------------------------------------------------------------- HBO Safety Checklist Details Patient Name: Date of Service: Jose, [name] MES [name]. 01/02/2023 10:00 A M Medical Record Number: [id number] Patient Account Number: [id number] Date of Birth/Sex: Treating RN: 1950/10/04 (72 y.o. [name], [name] Primary Care Genevia Bouldin: [name] Other Clinician: Haywood Pao Referring Makinzie Considine: Treating Donn Wilmot/Extender: Rhett Bannister Weeks in Treatment: 4 HBO Safety Checklist Items Safety Checklist Consent Form Signed Patient voided / foley secured and emptied When did you last eato Breakfast Last dose of injectable or oral agent 0730 Ostomy pouch emptied and vented if applicable All implantable devices assessed, documented and approved Intravenous access site secured and place Valuables secured NA Linens and cotton and cotton/polyester blend (less than 51% polyester) Personal oil-based products / skin lotions / body lotions removed Wigs or hairpieces removed NA Smoking or tobacco materials removed NA Books / newspapers / magazines / loose paper removed Cologne,  aftershave, perfume and deodorant removed Jewelry removed (may wrap wedding band) Make-up removed NA Hair care products removed Battery operated devices (external) removed Heating patches and chemical warmers removed Titanium eyewear removed Nail polish cured greater than  10 hours NA Casting material cured greater than 10 hours NA Hearing aids removed NA Loose dentures or partials removed NA Prosthetics have been removed NA Patient demonstrates correct use of air break device (if applicable) Patient concerns have been addressed Patient grounding bracelet on and cord attached to chamber Specifics for Inpatients (complete in addition to above) Medication sheet sent with patient NA Intravenous medications needed or due during therapy sent with patient NA Drainage tubes (e.g. nasogastric tube or chest tube secured and vented) NA Endotracheal or Tracheotomy tube secured NA Cuff deflated of air and inflated with saline NA Airway suctioned NA Notes Paper version used prior to treatment start. OJ at 0630. sausage patty, scrambled eggs, buttered toast, coffee at 0700. Glucerna 0800 Glucose 157 mg/dL@0824 . Electronic Signature(s) Signed: 01/02/2023 1:48:54 PM By: Haywood Pao CHT EMT BS , , Entered By: Haywood Pao on 01/02/2023 10:48:54

## 2023-01-02 NOTE — Progress Notes (Addendum)
Jose [name], Jose [name] ([id number]) [id number] Page 1 of 1 Visit Report for 01/02/2023 SuperBill Details Patient Name: Date of Service: Jose [name], Jose [name]. 01/02/2023 Medical Record Number: [id number] Patient Account Number: [id number] Date of Birth/Sex: Treating RN: Jul 30, 1950 (72 y.o. 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 R35.0 Frequency of micturition R30.0 Dysuria C61 Malignant neoplasm of prostate Physician Procedures Quantity CPT4 Code Description Modifier [id number] 99183 - WC PHYS HYPERBARIC OXYGEN THERAPY 1 ICD-10 Diagnosis Description 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

## 2023-01-02 NOTE — Progress Notes (Signed)
HASAAN, TREVOR D ([id number]) [id number] Page 1 of 2 Visit Report for 01/02/2023 Arrival Information Details Patient Name: Date of Service: JAYMEN, CHIMA MES D. 01/02/2023 10:00 A M Medical Record Number: [id number] Patient Account Number: [id number] Date of Birth/Sex: Treating RN: 1950/04/25 (72 y.o. [name], [name] Primary Care Gema Ringold: [name] Other Clinician: Haywood Pao Referring Ethyl Vila: Treating Markesha Hannig/Extender: Rhett Bannister Weeks in Treatment: 4 Visit Information History Since Last Visit All ordered tests and consults were completed: Yes Patient Arrived: Ambulatory Added or deleted any medications: No Arrival Time: 10:00 Any new allergies or adverse reactions: No Accompanied By: self Had a fall or experienced change in No Transfer Assistance: None activities of daily living that may affect Patient Identification Verified: Yes risk of falls: Secondary Verification Process Completed: Yes Signs or symptoms of abuse/neglect since last visito No Patient Requires Transmission-Based Precautions: No Hospitalized since last visit: No Patient Has Alerts: No Implantable device outside of the clinic excluding No cellular tissue based products placed in the center since last visit: Pain Present Now: No Electronic Signature(s) Signed: 01/02/2023 1:43:01 PM By: Haywood Pao CHT EMT BS , , Entered By: Haywood Pao on 01/02/2023 10:43:01 -------------------------------------------------------------------------------- Encounter Discharge Information Details Patient Name: Date of Service: [name] 01/02/2023 10:00 A M Medical Record Number: [id number] Patient Account Number: [id number] Date of Birth/Sex: Treating RN: 21-Jun-1950 (72 y.o. [name] Primary Care Jeani Fassnacht: [name] Other Clinician: Haywood Pao Referring Luismiguel Lamere: Treating Lariah Fleer/Extender: Metta Clines in Treatment: 4 Encounter Discharge Information Items Discharge Condition: Stable Ambulatory Status: Ambulatory Discharge Destination: Home Transportation: Private Auto Accompanied By: self Schedule Follow-up Appointment: No Clinical Summary of Care: Electronic Signature(s) Signed: 01/02/2023 2:06:32 PM By: Haywood Pao CHT EMT BS , , Previous Signature: 01/02/2023 2:06:17 PM Version By: Haywood Pao CHT EMT BS , , Entered By: Haywood Pao on 01/02/2023 11:06:32 [name] ([id number]) [id number] Page 2 of 2 -------------------------------------------------------------------------------- Vitals Details Patient Name: Date of Service: UEL, FRUIT MES D. 01/02/2023 10:00 A M Medical Record Number: [id number] Patient Account Number: [id number] Date of Birth/Sex: Treating RN: April 23, 1950 (72 y.o. [name], [name] Primary Care Olyver Hawes: [name] Other Clinician: Haywood Pao Referring Camya Haydon: Treating Soua Lenk/Extender: Abigail Miyamoto , Minerva Ends Weeks in Treatment: 4 Vital Signs Time Taken: 09:30 Temperature (F): 98.4 Height (in): 66 Pulse (bpm): 74 Weight (lbs): 184.6 Respiratory Rate (breaths/min): 18 Body Mass Index (BMI): 29.8 Blood Pressure (mmHg): 126/64 Capillary Blood Glucose (mg/dl): 86 Reference Range: 80 - 120 mg / dl Electronic Signature(s) Signed: 01/02/2023 1:43:36 PM By: Haywood Pao CHT EMT BS , , Entered By: Haywood Pao on 01/02/2023 10:43:36

## 2023-01-02 NOTE — Progress Notes (Addendum)
SLEVIN, GUNBY D ([id number]) [id number] Page 1 of 1 Visit Report for 01/01/2023 SuperBill Details Patient Name: Date of Service: MARQUARIUS, LOFTON MES D. 01/01/2023 Medical Record Number: [id number] Patient Account Number: [id number] Date of Birth/Sex: Treating RN: 07/14/1950 (72 y.o. 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 R35.0 Frequency of micturition R30.0 Dysuria C61 Malignant neoplasm of prostate Physician Procedures Quantity CPT4 Code Description Modifier [id number] 99183 - WC PHYS HYPERBARIC OXYGEN THERAPY 1 ICD-10 Diagnosis Description 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

## 2023-01-03 ENCOUNTER — Encounter (HOSPITAL_BASED_OUTPATIENT_CLINIC_OR_DEPARTMENT_OTHER): Payer: Medicare Other | Admitting: General Surgery

## 2023-01-03 DIAGNOSIS — C61 Malignant neoplasm of prostate: Secondary | ICD-10-CM | POA: Diagnosis not present

## 2023-01-03 LAB — GLUCOSE, CAPILLARY
Glucose-Capillary: 102 mg/dL — ABNORMAL HIGH (ref 70–99)
Glucose-Capillary: 87 mg/dL (ref 70–99)

## 2023-01-03 NOTE — Progress Notes (Signed)
DETERRIUS, HEINS D ([id number]) [id number] Page 1 of 2 Visit Report for 01/03/2023 Arrival Information Details Patient Name: Date of Service: Jose [name], [name] 01/03/2023 10:00 A M Medical Record Number: [id number] Patient Account Number: [id number] Date of Birth/Sex: Treating RN: March 13, 1950 (72 y.o. [name] Primary Care [place]: [name] Other Clinician: Haywood Pao Referring Jorden Mahl: Treating Shi Blankenship/Extender: [name] in Treatment: 4 Visit Information History Since Last Visit All ordered tests and consults were completed: Yes Patient Arrived: Ambulatory Added or deleted any medications: No Arrival Time: 09:24 Any new allergies or adverse reactions: No Accompanied By: self Had a fall or experienced change in No Transfer Assistance: None activities of daily living that may affect Patient Identification Verified: Yes risk of falls: Secondary Verification Process Completed: Yes Signs or symptoms of abuse/neglect since last visito No Patient Requires Transmission-Based Precautions: No Hospitalized since last visit: No Patient Has Alerts: No Implantable device outside of the clinic excluding No cellular tissue based products placed in the center since last visit: Pain Present Now: No Electronic Signature(s) Signed: 01/03/2023 3:58:02 PM By: Haywood Pao CHT EMT BS , , Entered By: Haywood Pao on 01/03/2023 15:58:02 -------------------------------------------------------------------------------- Encounter Discharge Information Details Patient Name: Date of Service: Jose Sousa MES D. 01/03/2023 10:00 A M Medical Record Number: [id number] Patient Account Number: [id number] Date of Birth/Sex: Treating RN: 10-07-1950 (72 y.o. [name] Primary Care Ishi Danser: [name] Other Clinician: Haywood Pao Referring Nollie Shiflett: Treating Patricie Geeslin/Extender: [name] in Treatment: 4 Encounter Discharge Information Items Discharge Condition: Stable Ambulatory Status: Ambulatory Discharge Destination: Home Transportation: Private Auto Accompanied By: self Schedule Follow-up Appointment: No Clinical Summary of Care: Electronic Signature(s) Signed: 01/03/2023 4:12:45 PM By: Haywood Pao CHT EMT BS , , Entered By: Haywood Pao on 01/03/2023 16:12:45 [name] ([id number]) [phone].pdf Page 2 of 2 -------------------------------------------------------------------------------- Vitals Details Patient Name: Date of Service: Jose [name], [name] 01/03/2023 10:00 A M Medical Record Number: [id number] Patient Account Number: [id number] Date of Birth/Sex: Treating RN: December 24, 1950 (72 y.o. [name] Primary Care Kenidy Crossland: [name] Other Clinician: Haywood Pao Referring Makara Lanzo: Treating Dena Esperanza/Extender: [name] in Treatment: 4 Vital Signs Time Taken: 09:48 Temperature (F): 97.2 Height (in): 66 Pulse (bpm): 80 Weight (lbs): 184.6 Respiratory Rate (breaths/min): 18 Body Mass Index (BMI): 29.8 Blood Pressure (mmHg): 190/90 Capillary Blood Glucose (mg/dl): 109 Reference Range: 80 - 120 mg / dl Electronic Signature(s) Signed: 01/03/2023 3:58:40 PM By: Haywood Pao CHT EMT BS , , Entered By: Haywood Pao on 01/03/2023 15:58:40

## 2023-01-04 ENCOUNTER — Encounter (HOSPITAL_BASED_OUTPATIENT_CLINIC_OR_DEPARTMENT_OTHER): Payer: Medicare Other | Attending: General Surgery | Admitting: General Surgery

## 2023-01-04 DIAGNOSIS — I2510 Atherosclerotic heart disease of native coronary artery without angina pectoris: Secondary | ICD-10-CM | POA: Diagnosis not present

## 2023-01-04 DIAGNOSIS — I10 Essential (primary) hypertension: Secondary | ICD-10-CM | POA: Diagnosis not present

## 2023-01-04 DIAGNOSIS — R300 Dysuria: Secondary | ICD-10-CM | POA: Diagnosis not present

## 2023-01-04 DIAGNOSIS — N3040 Irradiation cystitis without hematuria: Secondary | ICD-10-CM | POA: Insufficient documentation

## 2023-01-04 DIAGNOSIS — Z955 Presence of coronary angioplasty implant and graft: Secondary | ICD-10-CM | POA: Diagnosis not present

## 2023-01-04 DIAGNOSIS — Z8546 Personal history of malignant neoplasm of prostate: Secondary | ICD-10-CM | POA: Insufficient documentation

## 2023-01-04 DIAGNOSIS — R350 Frequency of micturition: Secondary | ICD-10-CM | POA: Diagnosis not present

## 2023-01-04 DIAGNOSIS — E119 Type 2 diabetes mellitus without complications: Secondary | ICD-10-CM | POA: Diagnosis not present

## 2023-01-04 DIAGNOSIS — Z923 Personal history of irradiation: Secondary | ICD-10-CM | POA: Diagnosis not present

## 2023-01-04 LAB — GLUCOSE, CAPILLARY
Glucose-Capillary: 112 mg/dL — ABNORMAL HIGH (ref 70–99)
Glucose-Capillary: 99 mg/dL (ref 70–99)

## 2023-01-04 NOTE — Progress Notes (Addendum)
TAVIUS, TURGEON D ([id number]) [id number] Page 1 of 2 Visit Report for 01/04/2023 Arrival Information Details Patient Name: Date of Service: SHAUNTE, WEISSINGER MES D. 01/04/2023 9:00 A M Medical Record Number: [id number] Patient Account Number: [id number] Date of Birth/Sex: Treating RN: June 22, 1950 (72 y.o. [name] Primary Care Joie Reamer: [name] Other Clinician: Haywood Pao Referring Ebenezer Mccaskey: Treating Badr Piedra/Extender: Delena Bali Weeks in Treatment: 4 Visit Information History Since Last Visit All ordered tests and consults were completed: Yes Patient Arrived: Ambulatory Added or deleted any medications: No Arrival Time: 08:21 Any new allergies or adverse reactions: No Accompanied By: self Had a fall or experienced change in No Transfer Assistance: None activities of daily living that may affect Patient Identification Verified: Yes risk of falls: Secondary Verification Process Completed: Yes Signs or symptoms of abuse/neglect since last visito No Patient Requires Transmission-Based Precautions: No Hospitalized since last visit: No Patient Has Alerts: No Implantable device outside of the clinic excluding No cellular tissue based products placed in the center since last visit: Pain Present Now: No Electronic Signature(s) Signed: 01/04/2023 9:30:28 AM By: Haywood Pao CHT EMT BS , , Entered By: Haywood Pao on 01/04/2023 09:30:28 -------------------------------------------------------------------------------- Encounter Discharge Information Details Patient Name: Date of Service: [name] 01/04/2023 9:00 A M Medical Record Number: [id number] Patient Account Number: [id number] Date of Birth/Sex: Treating RN: [birth year] (72 y.o. [name] Primary Care Caeleigh Prohaska: [name] Other Clinician: Haywood Pao Referring Denim Kalmbach: Treating Almando Brawley/Extender: Sigmund Hazel in Treatment: 4 Encounter Discharge Information Items Discharge Condition: Stable Ambulatory Status: Ambulatory Discharge Destination: Home Transportation: Private Auto Accompanied By: self Schedule Follow-up Appointment: No Clinical Summary of Care: Electronic Signature(s) Signed: 01/04/2023 11:32:20 AM By: Haywood Pao CHT EMT BS , , Entered By: Haywood Pao on 01/04/2023 11:32:20 [name] ([id number]) [id number] Page 2 of 2 -------------------------------------------------------------------------------- Vitals Details Patient Name: Date of Service: KAINOAH, BARTOSIEWICZ MES D. 01/04/2023 9:00 A M Medical Record Number: [id number] Patient Account Number: [id number] Date of Birth/Sex: Treating RN: [birth year] (72 y.o. [name] Primary Care Youa Deloney: [name] Other Clinician: Haywood Pao Referring Kylene Zamarron: Treating Ocean Kearley/Extender: Delena Bali Weeks in Treatment: 4 Vital Signs Time Taken: 08:30 Temperature (F): 98.0 Height (in): 66 Pulse (bpm): 63 Weight (lbs): 184.6 Respiratory Rate (breaths/min): 18 Body Mass Index (BMI): 29.8 Blood Pressure (mmHg): 170/78 Capillary Blood Glucose (mg/dl): 732 Reference Range: 80 - 120 mg / dl Electronic Signature(s) Signed: 01/04/2023 9:31:23 AM By: Haywood Pao CHT EMT BS , , Entered By: Haywood Pao on 01/04/2023 09:31:22

## 2023-01-04 NOTE — Progress Notes (Signed)
RIDDICK, NUON ([id number]) [id number] Page 1 of 1 Visit Report for 01/03/2023 SuperBill Details Patient Name: Date of Service: Jose [name], Jose [name] MES D. 01/03/2023 Medical Record Number: [id number] Patient Account Number: [id number] Date of Birth/Sex: Treating RN: January 24, 1951 (72 y.o. 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

## 2023-01-04 NOTE — Progress Notes (Addendum)
Jose [name] [name] ([id number]) [id number] Page 1 of 2 Visit Report for 01/04/2023 HBO Details Patient Name: Date of Service: Jose [name], Jose [name]. 01/04/2023 9:00 A M Medical Record Number: [id number] Patient Account Number: [id number] Date of Birth/Sex: Treating RN: December 09, 1950 (72 y.o. [name] Primary Care Tasheema Perrone: [name] Other Clinician: Haywood Pao Referring Brandn Mcgath: Treating Ephriam Turman/Extender: Delena Bali Weeks in Treatment: 4 HBO Treatment Course Details Treatment Course Number: 1 Ordering Aviyah Swetz: Baltazar Najjar T Treatments Ordered: otal 40 HBO Treatment Start Date: 12/17/2022 HBO Indication: Late Effect of Radiation HBO Treatment Details Treatment Number: 14 Patient Type: Outpatient Chamber Type: Monoplace Chamber Serial #: [id number] Treatment Protocol: 2.0 ATA with 90 minutes oxygen, and no air breaks Treatment Details Compression Rate Down: 1.5 psi / minute De-Compression Rate Up: 2.0 psi / minute Air breaks and breathing Decompress Decompress Compress Tx Pressure Begins Reached periods Begins Ends (leave unused spaces blank) Chamber Pressure (ATA 1 2 ------2 1 ) Clock Time (24 hr) 08:40 08:58 - - - - - - 10:28 10:40 Treatment Length: 120 (minutes) Treatment Segments: 4 Vital Signs Capillary Blood Glucose Reference Range: 80 - 120 mg / dl HBO Diabetic Blood Glucose Intervention Range: <131 mg/dl or >784 mg/dl Type: Time Vitals Blood Pulse: Respiratory Temperature: Capillary Blood Glucose Pulse Action Taken: Pressure: Rate: Glucose (mg/dl): Meter #: Oximetry (%) Taken: Pre 08:30 170/78 63 18 98 112 none per protocol Post 10:43 176/82 58 18 97.9 99 asymptomatic for bradycardia, none per orders. Treatment Response Treatment Toleration: Well Treatment Completion Status: Treatment Completed without Adverse Event Treatment Notes [name] arrived with vitals within protocol limits. His blood  glucose is being monitored by physician order as his medication is highly effective in controlling blood glucose level to the extent of causing patient to consume high amounts of food in order to comply with glycemia intervention protocol. He ate breakfast detailed in safety check form. After performing a safety check, patient was placed in the chamber which was compressed at a rate of 2 psi/min after confirming normal ear equalization. He tolerated the treatment and subsequent decompression of the chamber at a rate of 2 psi/min. He denied issues with ear equalization and pain associated with barotrauma. His post-treatment vitals were within limits at 176/82 mmHg. He stated that he felt fine and was stable upon discharge. Physician HBO Attestation: I certify that I supervised this HBO treatment in accordance with Medicare guidelines. A trained emergency response team is readily available per Yes hospital policies and procedures. Continue HBOT as ordered. Yes Electronic Signature(s) Signed: 01/04/2023 12:12:51 PM By: Jose Guess MD FACS Previous Signature: 01/04/2023 11:31:34 AM Version By: Haywood Pao CHT EMT BS , , [name] ([id number]) [id number] Page 2 of 2 Entered By: Jose [name] on 01/04/2023 12:12:50 -------------------------------------------------------------------------------- HBO Safety Checklist Details Patient Name: Date of Service: Jose [name], Jose [name]. 01/04/2023 9:00 A M Medical Record Number: [id number] Patient Account Number: [id number] Date of Birth/Sex: Treating RN: 1950/12/13 (72 y.o. [name] Primary Care Kenlyn Lose: [name] Other Clinician: Haywood Pao Referring Rashada Klontz: Treating Salah Nakamura/Extender: Delena Bali Weeks in Treatment: 4 HBO Safety Checklist Items Safety Checklist Consent Form Signed Patient voided / foley secured and emptied When did you last eato Breakfast Last dose  of injectable or oral agent 0645 Ostomy pouch emptied and vented if applicable NA All implantable devices assessed, documented and approved NA Intravenous access site secured and place NA Valuables secured Linens and cotton and cotton/polyester blend (  less than 51% polyester) Personal oil-based products / skin lotions / body lotions removed Wigs or hairpieces removed NA Smoking or tobacco materials removed NA Books / newspapers / magazines / loose paper removed Cologne, aftershave, perfume and deodorant removed Jewelry removed (may wrap wedding band) Make-up removed NA Hair care products removed Battery operated devices (external) removed Heating patches and chemical warmers removed Titanium eyewear removed Nail polish cured greater than 10 hours NA Casting material cured greater than 10 hours NA Hearing aids removed NA Loose dentures or partials removed NA Prosthetics have been removed NA Patient demonstrates correct use of air break device (if applicable) Patient concerns have been addressed Patient grounding bracelet on and cord attached to chamber Specifics for Inpatients (complete in addition to above) Medication sheet sent with patient NA Intravenous medications needed or due during therapy sent with patient NA Drainage tubes (e.g. nasogastric tube or chest tube secured and vented) NA Endotracheal or Tracheotomy tube secured NA Cuff deflated of air and inflated with saline NA Airway suctioned NA Notes Paper version used prior to treatment start. OJ at 0600, Breakfast of 1 fried egg, sausage patty, buttered toast at 0630, Glucerna 0645 Electronic Signature(s) Signed: 01/04/2023 9:33:11 AM By: Haywood Pao CHT EMT BS , , Entered By: Haywood Pao on 01/04/2023 09:33:11

## 2023-01-04 NOTE — Progress Notes (Signed)
[name], [name] [name] ([id number]) [id number] Page 1 of 1 Visit Report for 01/04/2023 SuperBill Details Patient Name: Date of Service: Jose [name], Jose [name]. 01/04/2023 Medical Record Number: [id number] Patient Account Number: [id number] Date of Birth/Sex: Treating RN: 1950-12-15 (72 y.o. 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

## 2023-01-07 ENCOUNTER — Encounter (HOSPITAL_BASED_OUTPATIENT_CLINIC_OR_DEPARTMENT_OTHER): Payer: Medicare Other | Admitting: Internal Medicine

## 2023-01-08 ENCOUNTER — Encounter (HOSPITAL_BASED_OUTPATIENT_CLINIC_OR_DEPARTMENT_OTHER): Payer: Medicare Other | Admitting: Internal Medicine

## 2023-01-08 DIAGNOSIS — N3040 Irradiation cystitis without hematuria: Secondary | ICD-10-CM | POA: Diagnosis not present

## 2023-01-08 LAB — GLUCOSE, CAPILLARY
Glucose-Capillary: 123 mg/dL — ABNORMAL HIGH (ref 70–99)
Glucose-Capillary: 99 mg/dL (ref 70–99)

## 2023-01-08 NOTE — Progress Notes (Signed)
ZIGMUND, LINSE ([id number]) [id number] Page 1 of 2 Visit Report for 01/08/2023 Arrival Information Details Patient Name: Date of Service: CORNELIS, KLUVER MES D. 01/08/2023 10:00 A M Medical Record Number: [id number] Patient Account Number: [id number] Date of Birth/Sex: Treating RN: [birth year] (72 y.o. [name], [name] Primary Care Edwards Mckelvie: [name] Other Clinician: Karl Bales Referring Lamark Schue: Treating Breanah Faddis/Extender: [name] in Treatment: 5 Visit Information History Since Last Visit All ordered tests and consults were completed: Yes Patient Arrived: Ambulatory Added or deleted any medications: No Arrival Time: 09:21 Any new allergies or adverse reactions: No Accompanied By: Wife Had a fall or experienced change in No Transfer Assistance: None activities of daily living that may affect Patient Identification Verified: Yes risk of falls: Secondary Verification Process Completed: Yes Signs or symptoms of abuse/neglect since last visito No Patient Requires Transmission-Based Precautions: No Hospitalized since last visit: No Patient Has Alerts: No Implantable device outside of the clinic excluding No cellular tissue based products placed in the center since last visit: Pain Present Now: No Electronic Signature(s) Signed: 01/08/2023 2:44:53 PM By: Karl Bales EMT Entered By: Karl Bales on 01/08/2023 11:44:53 -------------------------------------------------------------------------------- Encounter Discharge Information Details Patient Name: Date of Service: [name] 01/08/2023 10:00 A M Medical Record Number: [id number] Patient Account Number: [id number] Date of Birth/Sex: Treating RN: Jul 20, 1950 (72 y.o. [name] Primary Care Renay Crammer: [name] Other Clinician: Karl Bales Referring Rendell Thivierge: Treating Joseph Johns/Extender: [name] in  Treatment: 5 Encounter Discharge Information Items Discharge Condition: Stable Ambulatory Status: Ambulatory Discharge Destination: Home Transportation: Private Auto Accompanied By: Wife Schedule Follow-up Appointment: Yes Clinical Summary of Care: Electronic Signature(s) Signed: 01/08/2023 2:50:05 PM By: Karl Bales EMT Entered By: Karl Bales on 01/08/2023 11:50:05 [name] ([id number]) [id number] Page 2 of 2 -------------------------------------------------------------------------------- Vitals Details Patient Name: Date of Service: KETHAN, PAPADOPOULOS MES D. 01/08/2023 10:00 A M Medical Record Number: [id number] Patient Account Number: [id number] Date of Birth/Sex: Treating RN: 01-30-1951 (72 y.o. [name] Primary Care Booker Bhatnagar: [name] Other Clinician: Karl Bales Referring Esther Bradstreet: Treating Tameyah Koch/Extender: [name] in Treatment: 5 Vital Signs Time Taken: 09:40 Temperature (F): 97.9 Height (in): 66 Pulse (bpm): 68 Weight (lbs): 184.6 Respiratory Rate (breaths/min): 18 Body Mass Index (BMI): 29.8 Blood Pressure (mmHg): 173/92 Capillary Blood Glucose (mg/dl): 536 Reference Range: 80 - 120 mg / dl Electronic Signature(s) Signed: 01/08/2023 2:45:37 PM By: Karl Bales EMT Entered By: Karl Bales on 01/08/2023 11:45:37

## 2023-01-08 NOTE — Progress Notes (Signed)
Jose [name] [name] ([id number]) [id number] Page 1 of 2 Visit Report for 01/08/2023 HBO Details Patient Name: Date of Service: Jose [name], Jose [name]. 01/08/2023 10:00 A M Medical Record Number: [id number] Patient Account Number: [id number] Date of Birth/Sex: Treating RN: 1950/06/21 (72 y.o. [name] Primary Care Mikaele Stecher: [name] Other Clinician: Karl Bales Referring Adara Kittle: Treating Jailen Coward/Extender: [name] in Treatment: 5 HBO Treatment Course Details Treatment Course Number: 1 Ordering Nazli Penn: Baltazar Najjar T Treatments Ordered: otal 40 HBO Treatment Start Date: 12/17/2022 HBO Indication: Late Effect of Radiation HBO Treatment Details Treatment Number: 15 Patient Type: Outpatient Chamber Type: Monoplace Chamber Serial #: [id number] Treatment Protocol: 2.0 ATA with 90 minutes oxygen, and no air breaks Treatment Details Compression Rate Down: 2.0 psi / minute De-Compression Rate Up: 2.0 psi / minute Air breaks and breathing Decompress Decompress Compress Tx Pressure Begins Reached periods Begins Ends (leave unused spaces blank) Chamber Pressure (ATA 1 2 ------2 1 ) Clock Time (24 hr) 10:36 10:44 - - - - - - 12:14 12:23 Treatment Length: 107 (minutes) Treatment Segments: 4 Vital Signs Capillary Blood Glucose Reference Range: 80 - 120 mg / dl HBO Diabetic Blood Glucose Intervention Range: <131 mg/dl or >308 mg/dl Time Vitals Blood Respiratory Capillary Blood Glucose Pulse Action Type: Pulse: Temperature: Taken: Pressure: Rate: Glucose (mg/dl): Meter #: Oximetry (%) Taken: Pre 09:40 173/92 68 18 97.9 123 Post 12:32 179/84 58 18 98.1 99 Treatment Response Treatment Toleration: Well Treatment Completion Status: Treatment Completed without Adverse Event Tillman Kazmierski Notes No concerns with treatment given Physician HBO Attestation: I certify that I supervised this HBO treatment in accordance with  Medicare guidelines. A trained emergency response team is readily available per Yes hospital policies and procedures. Continue HBOT as ordered. Yes Electronic Signature(s) Signed: 01/08/2023 4:32:14 PM By: Baltazar Najjar MD Previous Signature: 01/08/2023 2:49:09 PM Version By: Karl Bales EMT Entered By: Baltazar Najjar on 01/08/2023 16:29:38 Jose [name] ([id number]) [id number] Page 2 of 2 -------------------------------------------------------------------------------- HBO Safety Checklist Details Patient Name: Date of Service: Jose [name], Jose [name]. 01/08/2023 10:00 A M Medical Record Number: [id number] Patient Account Number: [id number] Date of Birth/Sex: Treating RN: 1950-03-19 (72 y.o. [name] Primary Care Alura Olveda: [name] Other Clinician: Karl Bales Referring Cloie Wooden: Treating Johnnette Laux/Extender: [name] in Treatment: 5 HBO Safety Checklist Items Safety Checklist Consent Form Signed Patient voided / foley secured and emptied When did you last eato 0700 Last dose of injectable or oral agent 0800 Ostomy pouch emptied and vented if applicable NA All implantable devices assessed, documented and approved NA Intravenous access site secured and place NA Valuables secured Linens and cotton and cotton/polyester blend (less than 51% polyester) Personal oil-based products / skin lotions / body lotions removed Wigs or hairpieces removed NA Smoking or tobacco materials removed Books / newspapers / magazines / loose paper removed Cologne, aftershave, perfume and deodorant removed Jewelry removed (may wrap wedding band) Make-up removed NA Hair care products removed Battery operated devices (external) removed Heating patches and chemical warmers removed Titanium eyewear removed NA Nail polish cured greater than 10 hours NA Casting material cured greater than 10 hours NA Hearing aids  removed NA Loose dentures or partials removed NA Prosthetics have been removed NA Patient demonstrates correct use of air break device (if applicable) Patient concerns have been addressed Patient grounding bracelet on and cord attached to chamber Specifics for Inpatients (complete in addition to above) Medication sheet sent with patient NA Intravenous  medications needed or due during therapy sent with patient NA Drainage tubes (e.g. nasogastric tube or chest tube secured and vented) NA Endotracheal or Tracheotomy tube secured NA Cuff deflated of air and inflated with saline NA Airway suctioned NA Notes The safety checklist was done before the treatment was started. Electronic Signature(s) Signed: 01/08/2023 2:46:38 PM By: Karl Bales EMT Entered By: Karl Bales on 01/08/2023 14:46:38

## 2023-01-09 ENCOUNTER — Encounter (HOSPITAL_BASED_OUTPATIENT_CLINIC_OR_DEPARTMENT_OTHER): Payer: Medicare Other | Admitting: Internal Medicine

## 2023-01-09 DIAGNOSIS — N3040 Irradiation cystitis without hematuria: Secondary | ICD-10-CM | POA: Diagnosis not present

## 2023-01-09 LAB — GLUCOSE, CAPILLARY
Glucose-Capillary: 201 mg/dL — ABNORMAL HIGH (ref 70–99)
Glucose-Capillary: 123 mg/dL — ABNORMAL HIGH (ref 70–99)

## 2023-01-09 NOTE — Progress Notes (Addendum)
ASHUTOSH, DIEGUEZ [name] ([id number]) [id number] Page 1 of 2 Visit Report for 01/09/2023 HBO Details Patient Name: Date of Service: Jose [name], Jose [name]. 01/09/2023 7:30 A M Medical Record Number: [id number] Patient Account Number: [id number] Date of Birth/Sex: Treating RN: 12-09-1950 (72 y.o. [name] Primary Care Gael Delude: [name] Other Clinician: Haywood Pao Referring Jode Lippe: Treating Ofelia Podolski/Extender: [name] in Treatment: 5 HBO Treatment Course Details Treatment Course Number: 1 Ordering Jeff Mccallum: Baltazar Najjar T Treatments Ordered: otal 40 HBO Treatment Start Date: 12/17/2022 HBO Indication: Late Effect of Radiation HBO Treatment Details Treatment Number: 16 Patient Type: Outpatient Chamber Type: Monoplace Chamber Serial #: [id number] Treatment Protocol: 2.0 ATA with 90 minutes oxygen, and no air breaks Treatment Details Compression Rate Down: 1.5 psi / minute De-Compression Rate Up: 2.0 psi / minute Air breaks and breathing Decompress Decompress Compress Tx Pressure Begins Reached periods Begins Ends (leave unused spaces blank) Chamber Pressure (ATA 1 2 ------2 1 ) Clock Time (24 hr) 07:52 08:05 - - - - - - 09:35 09:43 Treatment Length: 111 (minutes) Treatment Segments: 4 Vital Signs Capillary Blood Glucose Reference Range: 80 - 120 mg / dl HBO Diabetic Blood Glucose Intervention Range: <131 mg/dl or >962 mg/dl Type: Time Vitals Blood Respiratory Capillary Blood Glucose Pulse Action Pulse: Temperature: Taken: Pressure: Rate: Glucose (mg/dl): Meter #: Oximetry (%) Taken: Pre 07:32 144/80 71 18 98.2 201 none per protocol Post 09:47 184/86 55 18 98.3 123 none per protocol Post 09:56 183/92 60 Treatment Response Treatment Toleration: Well Treatment Completion Status: Treatment Completed without Adverse Event Treatment Notes Jose [name] arrived with vital signs within normal range. Only his  blood glucose is being monitored by physician order as his medication is highly effective in controlling blood glucose level to the extent of causing patient to consume high amounts of food in order to comply with glycemia intervention protocol. Glycemia intervention protocol is no longer included by physician order. He prepared for treatment. After performing a safety check, patient was placed in the chamber which was compressed with 100% oxygen at a rate of 2 psi/min after confirming normal ear equalization. He tolerated the treatment and subsequent decompression of the chamber at 2 psi/min. Post-treatment blood pressure was above normal limits set by protocol. He has meetings today and stated that he felt fine but may be stressed. Blood pressure at home is within normal range. He has been informed to speak to his PCP about his blood pressure. He was stable upon discharge. Michaeljoseph Revolorio Notes No concerns with treatment given Physician HBO Attestation: I certify that I supervised this HBO treatment in accordance with Medicare guidelines. A trained emergency response team is readily available per Yes hospital policies and procedures. Continue HBOT as ordered. [address] OCTAVIA, MOTTOLA ([id number]) [id number] Page 2 of 2 Electronic Signature(s) Signed: 01/09/2023 4:54:50 PM By: Baltazar Najjar MD Previous Signature: 01/09/2023 2:25:35 PM Version By: Haywood Pao CHT EMT BS , , Entered By: Baltazar Najjar on 01/09/2023 16:50:28 -------------------------------------------------------------------------------- HBO Safety Checklist Details Patient Name: Date of Service: Jose [name]. 01/09/2023 7:30 A M Medical Record Number: [id number] Patient Account Number: [id number] Date of Birth/Sex: Treating RN: [birth year] (72 y.o. [name] Primary Care Marillyn Goren: [name] Other Clinician: Haywood Pao Referring Riley Hallum: Treating Saul Dorsi/Extender: [name] in Treatment: 5 HBO Safety Checklist Items Safety Checklist Consent Form Signed Patient voided / foley secured and emptied When did you last eato Breakfast Last dose of injectable or  oral agent 0600 Ostomy pouch emptied and vented if applicable NA All implantable devices assessed, documented and approved NA Intravenous access site secured and place NA Valuables secured Linens and cotton and cotton/polyester blend (less than 51% polyester) Personal oil-based products / skin lotions / body lotions removed Wigs or hairpieces removed NA Smoking or tobacco materials removed NA Books / newspapers / magazines / loose paper removed Cologne, aftershave, perfume and deodorant removed Jewelry removed (may wrap wedding band) Make-up removed NA Hair care products removed Battery operated devices (external) removed Heating patches and chemical warmers removed Titanium eyewear removed Nail polish cured greater than 10 hours NA Casting material cured greater than 10 hours NA Hearing aids removed NA Loose dentures or partials removed NA Prosthetics have been removed NA Patient demonstrates correct use of air break device (if applicable) Patient concerns have been addressed Patient grounding bracelet on and cord attached to chamber Specifics for Inpatients (complete in addition to above) Medication sheet sent with patient NA Intravenous medications needed or due during therapy sent with patient NA Drainage tubes (e.g. nasogastric tube or chest tube secured and vented) NA Endotracheal or Tracheotomy tube secured NA Cuff deflated of air and inflated with saline NA Airway suctioned NA Notes Paper version used prior to treatment start. Electronic Signature(s) Signed: 01/09/2023 2:08:53 PM By: Haywood Pao CHT EMT BS , , Entered By: Haywood Pao on 01/09/2023 14:08:53

## 2023-01-09 NOTE — Progress Notes (Signed)
Jose [name], Jose [name] ([id number]) [id number] Page 1 of 1 Visit Report for 01/09/2023 SuperBill Details Patient Name: Date of Service: [name], [name] 01/09/2023 Medical Record Number: [id number] Patient Account Number: [id number] Date of Birth/Sex: Treating RN: 01-09-1951 (72 y.o. 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 R35.0 Frequency of micturition R30.0 Dysuria C61 Malignant neoplasm of prostate Physician Procedures Quantity CPT4 Code Description Modifier [id number] - WC PHYS HYPERBARIC OXYGEN THERAPY 1 ICD-10 Diagnosis Description 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

## 2023-01-09 NOTE — Progress Notes (Signed)
LEAN, FAYSON ([id number]) [id number] Page 1 of 2 Visit Report for 01/09/2023 Arrival Information Details Patient Name: Date of Service: Jose [name], [name] 01/09/2023 7:30 A M Medical Record Number: [id number] Patient Account Number: [id number] Date of Birth/Sex: Treating RN: 31-Jan-1951 (72 y.o. [name] Primary Care Capone Schwinn: [name] Other Clinician: Haywood Pao Referring Lincon Sahlin: Treating Dorann Davidson/Extender: [name] in Treatment: 5 Visit Information History Since Last Visit All ordered tests and consults were completed: Yes Patient Arrived: Ambulatory Added or deleted any medications: No Arrival Time: 07:30 Any new allergies or adverse reactions: No Accompanied By: self Had a fall or experienced change in No Transfer Assistance: None activities of daily living that may affect Patient Identification Verified: Yes risk of falls: Secondary Verification Process Completed: Yes Signs or symptoms of abuse/neglect since last visito No Patient Requires Transmission-Based Precautions: No Hospitalized since last visit: No Patient Has Alerts: No Implantable device outside of the clinic excluding No cellular tissue based products placed in the center since last visit: Pain Present Now: No Electronic Signature(s) Signed: 01/09/2023 2:06:40 PM By: Haywood Pao CHT EMT BS , , Entered By: Haywood Pao on 01/09/2023 11:06:40 -------------------------------------------------------------------------------- Encounter Discharge Information Details Patient Name: Date of Service: Jose Sousa MES D. 01/09/2023 7:30 A M Medical Record Number: [id number] Patient Account Number: [id number] Date of Birth/Sex: Treating RN: Jul 09, 1950 (72 y.o. [name] Primary Care Marieclaire Bettenhausen: [name] Other Clinician: Haywood Pao Referring Sharalee Witman: Treating Myliyah Rebuck/Extender: [name] in Treatment: 5 Encounter Discharge Information Items Discharge Condition: Stable Ambulatory Status: Ambulatory Discharge Destination: Home Transportation: Private Auto Accompanied By: self Schedule Follow-up Appointment: No Clinical Summary of Care: Electronic Signature(s) Signed: 01/09/2023 2:33:04 PM By: Haywood Pao CHT EMT BS , , Entered By: Haywood Pao on 01/09/2023 11:33:04 Jose [name] ([id number]) [id number] Page 2 of 2 -------------------------------------------------------------------------------- Vitals Details Patient Name: Date of Service: Jose [name], [name] 01/09/2023 7:30 A M Medical Record Number: [id number] Patient Account Number: [id number] Date of Birth/Sex: Treating RN: August 20, 1950 (72 y.o. [name] Primary Care Hydeia Mcatee: [name] Other Clinician: Haywood Pao Referring Jessikah Dicker: Treating Hall Birchard/Extender: [name] in Treatment: 5 Vital Signs Time Taken: 07:32 Temperature (F): 98.2 Height (in): 66 Pulse (bpm): 71 Weight (lbs): 184.6 Respiratory Rate (breaths/min): 18 Body Mass Index (BMI): 29.8 Blood Pressure (mmHg): 144/80 Capillary Blood Glucose (mg/dl): 536 Reference Range: 80 - 120 mg / dl Electronic Signature(s) Signed: 01/09/2023 2:07:27 PM By: Haywood Pao CHT EMT BS , , Entered By: Haywood Pao on 01/09/2023 11:07:27

## 2023-01-09 NOTE — Progress Notes (Signed)
[name], [name] ([id number]) [id number] Page 1 of 2 Visit Report for 01/08/2023 Problem List Details Patient Name: Date of Service: [name], [name] 01/08/2023 10:00 A M Medical Record Number: [id number] Patient Account Number: [id number] Date of Birth/Sex: Treating RN: [birth year] (72 y.o. 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 01/08/2023 Medical Record Number: [id number] Patient Account Number: [id number] Date of Birth/Sex: Treating RN: [birth year] (72 y.o. 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 R35.0 Frequency of micturition COURVOISIER, HAMBLEN ([id number]) [phone].pdf Page 2 of 2 R30.0 Dysuria C61 Malignant neoplasm of prostate Facility Procedures : CPT4 Code Description: [id number] G0277-(Facility Use Only) HBOT full body chamber, , ICD-10 Diagnosis Description N30.40 Irradiation cystitis without  hematuria R35.0 Frequency of micturition R30.0 Dysuria C61 Malignant neoplasm of prostate Modifier: Quantity: 4 Physician Procedures : CPT4 Code Description Modifier [id number] 99183 - WC PHYS HYPERBARIC OXYGEN THERAPY ICD-10 Diagnosis Description 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

## 2023-01-10 ENCOUNTER — Encounter (HOSPITAL_BASED_OUTPATIENT_CLINIC_OR_DEPARTMENT_OTHER): Payer: Medicare Other | Admitting: Internal Medicine

## 2023-01-10 DIAGNOSIS — N3040 Irradiation cystitis without hematuria: Secondary | ICD-10-CM | POA: Diagnosis not present

## 2023-01-10 LAB — GLUCOSE, CAPILLARY
Glucose-Capillary: 157 mg/dL — ABNORMAL HIGH (ref 70–99)
Glucose-Capillary: 77 mg/dL (ref 70–99)

## 2023-01-10 NOTE — Progress Notes (Addendum)
Jose, HANAK [name] ([id number]) [id number] Page 1 of 2 Visit Report for 01/10/2023 HBO Details Patient Name: Date of Service: Jose [name], Jose MES [name]. 01/10/2023 10:00 A M Medical Record Number: [id number] Patient Account Number: [id number] Date of Birth/Sex: Treating RN: 1950-11-29 (72 y.o. [name] Primary Care Ahan Eisenberger: [name] Other Clinician: Referring Nandini Bogdanski: Treating Arnie Maiolo/Extender: [name] in Treatment: 5 HBO Treatment Course Details Treatment Course Number: 1 Ordering Cade Dashner: Baltazar Najjar T Treatments Ordered: otal 40 HBO Treatment Start Date: 12/17/2022 HBO Indication: Late Effect of Radiation HBO Treatment Details Treatment Number: 17 Patient Type: Outpatient Chamber Type: Monoplace Chamber Serial #: [id number] Treatment Protocol: 2.0 ATA with 90 minutes oxygen, and no air breaks Treatment Details Compression Rate Down: 1.5 psi / minute De-Compression Rate Up: 2.0 psi / minute Air breaks and breathing Decompress Decompress Compress Tx Pressure Begins Reached periods Begins Ends (leave unused spaces blank) Chamber Pressure (ATA 1 2 ------2 1 ) Clock Time (24 hr) 10:31 10:40 - - - - - - 12:10 12:20 Treatment Length: 109 (minutes) Treatment Segments: 4 Vital Signs Capillary Blood Glucose Reference Range: 80 - 120 mg / dl HBO Diabetic Blood Glucose Intervention Range: <131 mg/dl or >962 mg/dl Type: Time Vitals Blood Pulse: Respiratory Temperature: Capillary Blood Glucose Pulse Action Taken: Pressure: Rate: Glucose (mg/dl): Meter #: Oximetry (%) Taken: Pre 09:33 108/70 78 18 97.9 157 none per protocol Post 12:25 123/75 53 18 97.9 77 asymptomatic for bradycardia Treatment Response Treatment Toleration: Well Treatment Completion Status: Treatment Completed without Adverse Event Treatment Notes Mr. Fecher arrived with normal vital signs. He prepared for treatment. After performing a safety  check, patient was placed in the chamber which was compressed with 100% oxygen at a rate of 2 psi/min after confirming normal ear equalization. He tolerated the treatment and subsequent decompression at the rate of 2 psi/min. He denied any issues with ear equalization and/or pain related to barotrauma. Post-treatment vital signs were within normal range except heart rate of 53 bpm. He denied symptoms related to bradycardia. He was stable upon discharge. Ivey Nembhard Notes No concerns with treatment given Physician HBO Attestation: I certify that I supervised this HBO treatment in accordance with Medicare guidelines. A trained emergency response team is readily available per Yes hospital policies and procedures. Continue HBOT as ordered. Yes Electronic Signature(s) Signed: 01/10/2023 4:34:18 PM By: Baltazar Najjar MD [name] ([id number]) [id number] Page 2 of 2 Signed: 01/10/2023 4:34:18 PM By: Baltazar Najjar MD Previous Signature: 01/10/2023 1:39:01 PM Version By: Haywood Pao CHT EMT BS , , Previous Signature: 01/10/2023 12:54:54 PM Version By: Haywood Pao CHT EMT BS , , Previous Signature: 01/10/2023 11:01:30 AM Version By: Haywood Pao CHT EMT BS , , Entered By: Baltazar Najjar on 01/10/2023 13:21:36 -------------------------------------------------------------------------------- HBO Safety Checklist Details Patient Name: Date of Service: Jose [name] MES [name]. 01/10/2023 10:00 A M Medical Record Number: [id number] Patient Account Number: [id number] Date of Birth/Sex: Treating RN: Jun 17, 1950 (72 y.o. [name] Primary Care Meggen Spaziani: [name] Other Clinician: Haywood Pao Referring Joliyah Lippens: Treating Shaniquia Brafford/Extender: [name] in Treatment: 5 HBO Safety Checklist Items Safety Checklist Consent Form Signed Patient voided / foley secured and emptied When did you last eato Breakfast 0700 Last  dose of injectable or oral agent 0720 Ostomy pouch emptied and vented if applicable NA All implantable devices assessed, documented and approved NA Intravenous access site secured and place NA Valuables secured Linens and cotton and cotton/polyester blend (less than  51% polyester) Personal oil-based products / skin lotions / body lotions removed Wigs or hairpieces removed NA Smoking or tobacco materials removed NA Books / newspapers / magazines / loose paper removed Cologne, aftershave, perfume and deodorant removed Jewelry removed (may wrap wedding band) Make-up removed NA Hair care products removed Battery operated devices (external) removed Heating patches and chemical warmers removed Titanium eyewear removed Nail polish cured greater than 10 hours NA Casting material cured greater than 10 hours NA Hearing aids removed NA Loose dentures or partials removed NA Prosthetics have been removed NA Patient demonstrates correct use of air break device (if applicable) Patient concerns have been addressed Patient grounding bracelet on and cord attached to chamber Specifics for Inpatients (complete in addition to above) Medication sheet sent with patient NA Intravenous medications needed or due during therapy sent with patient NA Drainage tubes (e.g. nasogastric tube or chest tube secured and vented) NA Endotracheal or Tracheotomy tube secured NA Cuff deflated of air and inflated with saline NA Airway suctioned NA Notes Paper version used prior to treatment start. Electronic Signature(s) Signed: 01/10/2023 11:19:41 AM By: Haywood Pao CHT EMT BS , , Previous Signature: 01/10/2023 11:00:05 AM Version By: Haywood Pao CHT EMT BS , , Entered By: Haywood Pao on 01/10/2023 08:19:41

## 2023-01-10 NOTE — Progress Notes (Signed)
OSVALDO, LAMPING ([id number]) [id number] Page 1 of 1 Visit Report for 01/10/2023 SuperBill Details Patient Name: Date of Service: Jose [name], Jose [name] D. 01/10/2023 Medical Record Number: [id number] Patient Account Number: [id number] Date of Birth/Sex: Treating RN: [birth year] (72 y.o. 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

## 2023-01-10 NOTE — Progress Notes (Addendum)
[name], HU ([id number]) [id number] Page 1 of 2 Visit Report for 01/10/2023 Arrival Information Details Patient Name: Date of Service: Jose [name], [name] 01/10/2023 10:00 A M Medical Record Number: [id number] Patient Account Number: [id number] Date of Birth/Sex: Treating RN: August 16, 1950 (72 y.o. [name] Primary Care Zhuri Krass: [name] Other Clinician: Karl Bales Referring Jadarion Halbig: Treating Letcher Schweikert/Extender: [name] in Treatment: 5 Visit Information History Since Last Visit All ordered tests and consults were completed: Yes Patient Arrived: Ambulatory Added or deleted any medications: No Arrival Time: 09:22 Any new allergies or adverse reactions: No Accompanied By: self Had a fall or experienced change in No Transfer Assistance: None activities of daily living that may affect Patient Identification Verified: Yes risk of falls: Secondary Verification Process Completed: Yes Signs or symptoms of abuse/neglect since last visito No Patient Requires Transmission-Based Precautions: No Hospitalized since last visit: No Patient Has Alerts: No Implantable device outside of the clinic excluding No cellular tissue based products placed in the center since last visit: Pain Present Now: No Electronic Signature(s) Signed: 01/10/2023 10:57:08 AM By: Haywood Pao CHT EMT BS , , Entered By: Haywood Pao on 01/10/2023 07:57:08 -------------------------------------------------------------------------------- Encounter Discharge Information Details Patient Name: Date of Service: Jose [name] MES D. 01/10/2023 10:00 A M Medical Record Number: [id number] Patient Account Number: [id number] Date of Birth/Sex: Treating RN: 1950-07-04 (72 y.o. [name] Primary Care Vora Clover: [name] Other Clinician: Haywood Pao Referring Reuben Knoblock: Treating Elowyn Raupp/Extender: [name] in Treatment: 5 Encounter Discharge Information Items Discharge Condition: Stable Ambulatory Status: Ambulatory Discharge Destination: Home Transportation: Private Auto Accompanied By: self Schedule Follow-up Appointment: No Clinical Summary of Care: Electronic Signature(s) Signed: 01/10/2023 12:55:45 PM By: Haywood Pao CHT EMT BS , , Entered By: Haywood Pao on 01/10/2023 09:55:45 [name] ([id number]) [id number] Page 2 of 2 -------------------------------------------------------------------------------- Vitals Details Patient Name: Date of Service: Jose [name], [name] 01/10/2023 10:00 A M Medical Record Number: [id number] Patient Account Number: [id number] Date of Birth/Sex: Treating RN: Dec 27, 1950 (72 y.o. [name] Primary Care Jose [name]: [name] Other Clinician: Karl Bales Referring Journee Kohen: Treating Adiana Smelcer/Extender: [name] in Treatment: 5 Vital Signs Time Taken: 09:33 Temperature (F): 97.9 Height (in): 66 Pulse (bpm): 78 Weight (lbs): 184.6 Respiratory Rate (breaths/min): 18 Body Mass Index (BMI): 29.8 Blood Pressure (mmHg): 108/70 Capillary Blood Glucose (mg/dl): 536 Reference Range: 80 - 120 mg / dl Electronic Signature(s) Signed: 01/10/2023 10:58:42 AM By: Haywood Pao CHT EMT BS , , Entered By: Haywood Pao on 01/10/2023 07:58:42

## 2023-01-11 ENCOUNTER — Encounter (HOSPITAL_BASED_OUTPATIENT_CLINIC_OR_DEPARTMENT_OTHER): Payer: Medicare Other | Admitting: General Surgery

## 2023-01-11 DIAGNOSIS — N3040 Irradiation cystitis without hematuria: Secondary | ICD-10-CM | POA: Diagnosis not present

## 2023-01-11 LAB — GLUCOSE, CAPILLARY
Glucose-Capillary: 84 mg/dL (ref 70–99)
Glucose-Capillary: 98 mg/dL (ref 70–99)

## 2023-01-11 NOTE — Progress Notes (Signed)
YACOB, CORTES D ([id number]) [id number] Page 1 of 2 Visit Report for 01/11/2023 HBO Details Patient Name: Date of Service: Jose [name], [name] 01/11/2023 9:30 A M Medical Record Number: [id number] Patient Account Number: [id number] Date of Birth/Sex: Treating RN: 1950-10-27 (72 y.o. Jose [name] Primary Care Onnie Hatchel: [name] Other Clinician: Karl Bales Referring Aeva Posey: Treating Lezli Danek/Extender: Delena Bali Weeks in Treatment: 5 HBO Treatment Course Details Treatment Course Number: 1 Ordering Reia Viernes: Baltazar Najjar T Treatments Ordered: otal 40 HBO Treatment Start Date: 12/17/2022 HBO Indication: Late Effect of Radiation HBO Treatment Details Treatment Number: 18 Patient Type: Outpatient Chamber Type: Monoplace Chamber Serial #: [id number] Treatment Protocol: 2.0 ATA with 90 minutes oxygen, and no air breaks Treatment Details Compression Rate Down: 2.0 psi / minute De-Compression Rate Up: 2.0 psi / minute Air breaks and breathing Decompress Decompress Compress Tx Pressure Begins Reached periods Begins Ends (leave unused spaces blank) Chamber Pressure (ATA 1 2 ------2 1 ) Clock Time (24 hr) 10:22 10:30 - - - - - - 12:00 12:07 Treatment Length: 105 (minutes) Treatment Segments: 3 Vital Signs Capillary Blood Glucose Reference Range: 80 - 120 mg / dl HBO Diabetic Blood Glucose Intervention Range: <131 mg/dl or >952 mg/dl Type: Time Vitals Blood Pulse: Respiratory Temperature: Capillary Blood Glucose Pulse Action Taken: Pressure: Rate: Glucose (mg/dl): Meter #: Oximetry (%) Taken: Pre 09:35 136/77 68 18 98.1 84 Patient given 8 oz orange juice per doctor order Post 12:12 185/85 58 18 98.2 98 Treatment Response Treatment Toleration: Well Treatment Completion Status: Treatment Completed without Adverse Event Treatment Notes On arrival the patient blood sugar was 84. The patient was given 8 oz orange juice  per doctor orders. The patient treatment was started after speaking with Dr. Lady Gary. Physician HBO Attestation: I certify that I supervised this HBO treatment in accordance with Medicare guidelines. A trained emergency response team is readily available per Yes hospital policies and procedures. Continue HBOT as ordered. Yes Electronic Signature(s) Signed: 01/14/2023 7:41:04 AM By: Duanne Guess MD FACS Previous Signature: 01/11/2023 12:23:49 PM Version By: Karl Bales EMT Previous Signature: 01/11/2023 12:23:17 PM Version By: Karl Bales EMT Entered By: Duanne Guess on 01/14/2023 04:41:03 Marvis Repress D ([id number]) [id number] Page 2 of 2 -------------------------------------------------------------------------------- HBO Safety Checklist Details Patient Name: Date of Service: [name], [name] MES D. 01/11/2023 9:30 A M Medical Record Number: [id number] Patient Account Number: [id number] Date of Birth/Sex: Treating RN: [birth year] (72 y.o. Jose [name] Primary Care Adaiah Morken: [name] Other Clinician: Karl Bales Referring Noemi [name]: Treating Dainel Arcidiacono/Extender: Delena Bali Weeks in Treatment: 5 HBO Safety Checklist Items Safety Checklist Consent Form Signed Patient voided / foley secured and emptied When did you last eato 0630 Last dose of injectable or oral agent 0745 Ostomy pouch emptied and vented if applicable NA All implantable devices assessed, documented and approved NA Intravenous access site secured and place NA Valuables secured Linens and cotton and cotton/polyester blend (less than 51% polyester) Personal oil-based products / skin lotions / body lotions removed Wigs or hairpieces removed NA Smoking or tobacco materials removed Books / newspapers / magazines / loose paper removed Cologne, aftershave, perfume and deodorant removed Jewelry removed (may wrap wedding band) Make-up removed NA Hair care  products removed Battery operated devices (external) removed Heating patches and chemical warmers removed Titanium eyewear removed NA Nail polish cured greater than 10 hours NA Casting material cured greater than 10 hours NA Hearing aids removed NA Loose dentures or partials removed NA  Prosthetics have been removed NA Patient demonstrates correct use of air break device (if applicable) Patient concerns have been addressed Patient grounding bracelet on and cord attached to chamber Specifics for Inpatients (complete in addition to above) Medication sheet sent with patient NA Intravenous medications needed or due during therapy sent with patient NA Drainage tubes (e.g. nasogastric tube or chest tube secured and vented) NA Endotracheal or Tracheotomy tube secured NA Cuff deflated of air and inflated with saline NA Airway suctioned NA Notes The safety checklist was done before the treatment was started. Electronic Signature(s) Signed: 01/11/2023 11:10:42 AM By: Karl Bales EMT Entered By: Karl Bales on 01/11/2023 08:10:41

## 2023-01-11 NOTE — Progress Notes (Addendum)
MAC, DEFORE D ([id number]) [id number] Page 1 of 2 Visit Report for 01/11/2023 Arrival Information Details Patient Name: Date of Service: NEELY, BESTER MES D. 01/11/2023 9:30 A M Medical Record Number: [id number] Patient Account Number: [id number] Date of Birth/Sex: Treating RN: 10-Jan-1951 (72 y.o. [name], [name] Primary Care Jenayah Antu: [name] Other Clinician: Karl Bales Referring Tayon Parekh: Treating Keyana Guevara/Extender: Delena Bali Weeks in Treatment: 5 Visit Information History Since Last Visit All ordered tests and consults were completed: Yes Patient Arrived: Ambulatory Added or deleted any medications: No Arrival Time: 09:19 Any new allergies or adverse reactions: No Accompanied By: None Had a fall or experienced change in No Transfer Assistance: None activities of daily living that may affect Patient Identification Verified: Yes risk of falls: Secondary Verification Process Completed: Yes Signs or symptoms of abuse/neglect since last visito No Patient Requires Transmission-Based Precautions: No Hospitalized since last visit: No Patient Has Alerts: No Implantable device outside of the clinic excluding No cellular tissue based products placed in the center since last visit: Pain Present Now: No Electronic Signature(s) Signed: 01/11/2023 11:09:16 AM By: Karl Bales EMT Entered By: Karl Bales on 01/11/2023 11:09:16 -------------------------------------------------------------------------------- Encounter Discharge Information Details Patient Name: Date of Service: [name] 01/11/2023 9:30 A M Medical Record Number: [id number] Patient Account Number: [id number] Date of Birth/Sex: Treating RN: [birth year] (72 y.o. [name] Primary Care Daksha Koone: [name] Other Clinician: Karl Bales Referring Lanier Felty: Treating Quantavia Frith/Extender: Sigmund Hazel in  Treatment: 5 Encounter Discharge Information Items Discharge Condition: Stable Ambulatory Status: Ambulatory Discharge Destination: Home Transportation: Private Auto Accompanied By: None Schedule Follow-up Appointment: Yes Clinical Summary of Care: Electronic Signature(s) Signed: 01/11/2023 12:25:01 PM By: Karl Bales EMT Entered By: Karl Bales on 01/11/2023 12:25:01 [name] ([id number]) [id number] Page 2 of 2 -------------------------------------------------------------------------------- Vitals Details Patient Name: Date of Service: ARDAN, ASTA MES D. 01/11/2023 9:30 A M Medical Record Number: [id number] Patient Account Number: [id number] Date of Birth/Sex: Treating RN: Jun 08, 1950 (72 y.o. [name] Primary Care Hawke Villalpando: [name] Other Clinician: Karl Bales Referring Stevon Gough: Treating Mort Smelser/Extender: Delena Bali Weeks in Treatment: 5 Vital Signs Time Taken: 09:35 Temperature (F): 98.1 Height (in): 66 Pulse (bpm): 68 Weight (lbs): 184.6 Respiratory Rate (breaths/min): 18 Body Mass Index (BMI): 29.8 Blood Pressure (mmHg): 136/77 Capillary Blood Glucose (mg/dl): 84 Reference Range: 80 - 120 mg / dl Electronic Signature(s) Signed: 01/11/2023 11:09:44 AM By: Karl Bales EMT Entered By: Karl Bales on 01/11/2023 11:09:44

## 2023-01-11 NOTE — Progress Notes (Signed)
PHUNG, BUSSA ([id number]) [id number] Page 1 of 2 Visit Report for 01/11/2023 Problem List Details Patient Name: Date of Service: COSTELLO, WINKOWSKI MES D. 01/11/2023 9:30 A M Medical Record Number: [id number] Patient Account Number: [id number] Date of Birth/Sex: Treating RN: 1950/09/15 (72 y.o. 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 01/11/2023 Medical Record Number: [id number] Patient Account Number: [id number] Date of Birth/Sex: Treating RN: [birth year] (72 y.o. 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 R35.0 Frequency of micturition WYAT, KOSICK ([id number]) [phone].pdf Page 2 of 2 R30.0 Dysuria C61 Malignant neoplasm of prostate Facility Procedures : CPT4 Code Description: [id number] G0277-(Facility Use Only) HBOT full body chamber, , ICD-10 Diagnosis Description N30.40 Irradiation cystitis  without hematuria R35.0 Frequency of micturition R30.0 Dysuria Modifier: Quantity: 3 Physician Procedures : CPT4 Code Description Modifier [id number] - WC PHYS HYPERBARIC OXYGEN THERAPY ICD-10 Diagnosis Description 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

## 2023-01-14 ENCOUNTER — Encounter (HOSPITAL_BASED_OUTPATIENT_CLINIC_OR_DEPARTMENT_OTHER): Payer: Medicare Other | Admitting: Internal Medicine

## 2023-01-14 DIAGNOSIS — N3040 Irradiation cystitis without hematuria: Secondary | ICD-10-CM | POA: Diagnosis not present

## 2023-01-14 LAB — GLUCOSE, CAPILLARY
Glucose-Capillary: 171 mg/dL — ABNORMAL HIGH (ref 70–99)
Glucose-Capillary: 111 mg/dL — ABNORMAL HIGH (ref 70–99)

## 2023-01-14 NOTE — Progress Notes (Addendum)
Jose [name], Jose [name] ([id number]) [id number] Page 1 of 2 Visit Report for 01/14/2023 Arrival Information Details Patient Name: Date of Service: Jose [name], Jose [name] MES D. 01/14/2023 10:00 A M Medical Record Number: [id number] Patient Account Number: [id number] Date of Birth/Sex: Treating RN: [birth year] (72 y.o. [name], [name] Primary Care [place] Dube: [name] Other Clinician: Haywood Pao Referring Berma Harts: Treating Mitzy Naron/Extender: [name] in Treatment: 5 Visit Information History Since Last Visit All ordered tests and consults were completed: Yes Patient Arrived: Ambulatory Added or deleted any medications: No Arrival Time: 09:17 Any new allergies or adverse reactions: No Accompanied By: self Had a fall or experienced change in No Transfer Assistance: None activities of daily living that may affect Patient Identification Verified: Yes risk of falls: Secondary Verification Process Completed: Yes Signs or symptoms of abuse/neglect since last visito No Patient Requires Transmission-Based Precautions: No Hospitalized since last visit: No Patient Has Alerts: No Implantable device outside of the clinic excluding No cellular tissue based products placed in the center since last visit: Pain Present Now: No Electronic Signature(s) Signed: 01/14/2023 11:38:50 AM By: Haywood Pao CHT EMT BS , , Entered By: Haywood Pao on 01/14/2023 11:38:49 -------------------------------------------------------------------------------- Encounter Discharge Information Details Patient Name: Date of Service: Jose [name] MES D. 01/14/2023 10:00 A M Medical Record Number: [id number] Patient Account Number: [id number] Date of Birth/Sex: Treating RN: [birth year] (72 y.o. [name] Primary Care [name]: [name] Other Clinician: Haywood Pao Referring Danuta Huseman: Treating Kaiyan Luczak/Extender: [name] in Treatment: 5 Encounter Discharge Information Items Discharge Condition: Stable Ambulatory Status: Ambulatory Discharge Destination: Home Transportation: Private Auto Accompanied By: self Schedule Follow-up Appointment: No Clinical Summary of Care: Electronic Signature(s) Signed: 01/14/2023 12:36:17 PM By: Haywood Pao CHT EMT BS , , Entered By: Haywood Pao on 01/14/2023 12:36:17 [name] ([id number]) [id number] Page 2 of 2 -------------------------------------------------------------------------------- Vitals Details Patient Name: Date of Service: Jose [name], Jose [name] MES D. 01/14/2023 10:00 A M Medical Record Number: [id number] Patient Account Number: [id number] Date of Birth/Sex: Treating RN: [birth year] (71 y.o. [name] Primary Care [name]: [name] Other Clinician: Haywood Pao Referring Ardella Chhim: Treating Darell Saputo/Extender: [name] in Treatment: 5 Vital Signs Time Taken: 09:43 Temperature (F): 98.2 Height (in): 66 Pulse (bpm): 59 Weight (lbs): 184.6 Respiratory Rate (breaths/min): 18 Body Mass Index (BMI): 29.8 Blood Pressure (mmHg): 166/72 Capillary Blood Glucose (mg/dl): 563 Reference Range: 80 - 120 mg / dl Electronic Signature(s) Signed: 01/14/2023 11:39:31 AM By: Haywood Pao CHT EMT BS , , Entered By: Haywood Pao on 01/14/2023 11:39:31

## 2023-01-14 NOTE — Progress Notes (Addendum)
[name], [name] ([id number]) [id number] Page 1 of 1 Visit Report for 01/14/2023 SuperBill Details Patient Jose [name]: Date of Service: Jose [name], [name] 01/14/2023 Medical Record Number: [id number] Patient Account Number: [id number] Date of Birth/Sex: Treating RN: 06-14-1950 (72 y.o. 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 R35.0 Frequency of micturition R30.0 Dysuria C61 Malignant neoplasm of prostate Physician Procedures Quantity CPT4 Code Description Modifier [id number] 99183 - WC PHYS HYPERBARIC OXYGEN THERAPY 1 ICD-10 Diagnosis Description 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

## 2023-01-14 NOTE — Progress Notes (Addendum)
LEXTON, HEARN D ([id number]) [id number] Page 1 of 2 Visit Report for 01/14/2023 HBO Details Patient Name: Date of Service: Jose [name], [name] 01/14/2023 10:00 A M Medical Record Number: [id number] Patient Account Number: [id number] Date of Birth/Sex: Treating RN: 06-17-1950 (72 y.o. Jose [name] Primary Care Betsaida [place]: [name] Other Clinician: Haywood Pao Referring Corley Kohls: Treating Darel Ricketts/Extender: [name] in Treatment: 5 HBO Treatment Course Details Treatment Course Number: 1 Ordering Genaro Bekker: Baltazar Najjar T Treatments Ordered: otal 40 HBO Treatment Start Date: 12/17/2022 HBO Indication: Late Effect of Radiation HBO Treatment Details Treatment Number: 19 Patient Type: Outpatient Chamber Type: Monoplace Chamber Serial #: [id number] Treatment Protocol: 2.0 ATA with 90 minutes oxygen, and no air breaks Treatment Details Compression Rate Down: 1.5 psi / minute De-Compression Rate Up: 2.0 psi / minute Air breaks and breathing Decompress Decompress Compress Tx Pressure Begins Reached periods Begins Ends (leave unused spaces blank) Chamber Pressure (ATA 1 2 ------2 1 ) Clock Time (24 hr) 09:52 10:04 - - - - - - 11:34 11:46 Treatment Length: 114 (minutes) Treatment Segments: 4 Vital Signs Capillary Blood Glucose Reference Range: 80 - 120 mg / dl HBO Diabetic Blood Glucose Intervention Range: <131 mg/dl or >324 mg/dl Type: Time Vitals Blood Pulse: Respiratory Temperature: Capillary Blood Glucose Pulse Action Taken: Pressure: Rate: Glucose (mg/dl): Meter #: Oximetry (%) Taken: Pre 09:43 166/72 59 18 98.2 171 none per protocol Post 11:49 190/76 53 18 98.3 111 asymptomatic for bradycardia, hypertension Treatment Response Treatment Toleration: Well Treatment Completion Status: Treatment Completed without Adverse Event Treatment Notes Mr. Obenour arrived and prepared for treatment. Blood glucose level  was 171 mg/dL. After performing a safety check, patient was placed in the chamber which was compressed with 100% oxygen at a rate of 2 psi/min after confirming normal ear equalization. 4 oz cranberry juice was sent in for safety/intervention. He tolerated the treatment and subsequent decompression of the chamber at a rate of 2 psi/min. He denied any issues with ear equalization. Post-treatment vital signs were out of range with 190/76 mmHg blood pressure and heart rate of 53 bpm. He denied symptoms associated with bradycardia and/or hypertension. He said he felt fine. He was stable upon discharge. Audelia Knape Notes No concern with treatment given Physician HBO Attestation: I certify that I supervised this HBO treatment in accordance with Medicare guidelines. A trained emergency response team is readily available per Yes hospital policies and procedures. Continue HBOT as ordered. [address] EUSTACIO, LUTH ([id number]) [id number] Page 2 of 2 Signed: 01/14/2023 4:37:17 PM By: Baltazar Najjar MD Previous Signature: 01/14/2023 12:34:17 PM Version By: Haywood Pao CHT EMT BS , , Entered By: Baltazar Najjar on 01/14/2023 13:34:48 -------------------------------------------------------------------------------- HBO Safety Checklist Details Patient Name: Date of Service: Jose [name] MES D. 01/14/2023 10:00 A M Medical Record Number: [id number] Patient Account Number: [id number] Date of Birth/Sex: Treating RN: 1950/03/11 (72 y.o. Jose [name] Primary Care Sparkle Aube: [name] Other Clinician: Haywood Pao Referring Ostin Mathey: Treating Gerron Guidotti/Extender: [name] in Treatment: 5 HBO Safety Checklist Items Safety Checklist Consent Form Signed Patient voided / foley secured and emptied When did you last eato 0700 - List of food. Last dose of injectable or oral agent 0715 - glimepiride Ostomy pouch emptied and  vented if applicable NA All implantable devices assessed, documented and approved NA Intravenous access site secured and place NA Valuables secured Linens and cotton and cotton/polyester blend (less than 51% polyester) Personal oil-based products / skin  lotions / body lotions removed Wigs or hairpieces removed NA Smoking or tobacco materials removed NA Books / newspapers / magazines / loose paper removed Cologne, aftershave, perfume and deodorant removed Jewelry removed (may wrap wedding band) Make-up removed NA Hair care products removed Battery operated devices (external) removed Heating patches and chemical warmers removed Titanium eyewear removed Nail polish cured greater than 10 hours NA Casting material cured greater than 10 hours NA Hearing aids removed NA Loose dentures or partials removed NA Prosthetics have been removed NA Patient demonstrates correct use of air break device (if applicable) Patient concerns have been addressed Patient grounding bracelet on and cord attached to chamber Specifics for Inpatients (complete in addition to above) Medication sheet sent with patient NA Intravenous medications needed or due during therapy sent with patient NA Drainage tubes (e.g. nasogastric tube or chest tube secured and vented) NA Endotracheal or Tracheotomy tube secured NA Cuff deflated of air and inflated with saline NA Airway suctioned NA Notes Paper version used prior to treatment start. 0630 Coffee, 0700 1 boiled egg, 1 slice bacon, toast with strawberry jam, 1 glass of OJ, 0715 Premier Protein Shake. Electronic Signature(s) Signed: 01/14/2023 11:42:04 AM By: Haywood Pao CHT EMT BS , , Entered By: Haywood Pao on 01/14/2023 08:42:03

## 2023-01-15 ENCOUNTER — Encounter (HOSPITAL_BASED_OUTPATIENT_CLINIC_OR_DEPARTMENT_OTHER): Payer: Medicare Other | Admitting: Internal Medicine

## 2023-01-15 DIAGNOSIS — N3040 Irradiation cystitis without hematuria: Secondary | ICD-10-CM | POA: Diagnosis not present

## 2023-01-15 LAB — GLUCOSE, CAPILLARY
Glucose-Capillary: 110 mg/dL — ABNORMAL HIGH (ref 70–99)
Glucose-Capillary: 108 mg/dL — ABNORMAL HIGH (ref 70–99)

## 2023-01-15 NOTE — Progress Notes (Signed)
CASEN, HOUSMAN D ([id number]) [id number] Page 1 of 2 Visit Report for 01/15/2023 HBO Details Patient Name: Date of Service: Jose [name], [name] 01/15/2023 10:00 A M Medical Record Number: [id number] Patient Account Number: [id number] Date of Birth/Sex: Treating RN: [birth year] (72 y.o. Jose [name] Primary Care Jose [name]: [name] Other Clinician: Haywood [name] Referring Jose [name]: Treating Jose [name]: Jose [name]: 6 HBO [name] Course Details [name] Course Number: 1 Ordering Jose [name]: Jose [name] T Treatments Ordered: otal 40 HBO [name] Start Date: 12/17/2022 HBO Indication: Late Effect of Radiation HBO [name] Details [name] Number: 20 Patient Type: Outpatient Chamber Type: Monoplace Chamber Serial #: [id number] [name] Protocol: 2.0 ATA with 90 minutes oxygen, and no air breaks [name] Details Compression Rate Down: 1.5 psi / minute De-Compression Rate Up: 2.0 psi / minute Air breaks and breathing Decompress Decompress Compress Tx Pressure Begins Reached periods Begins Ends (leave unused spaces blank) Chamber Pressure (ATA 1 2 ------2 1 ) Clock Time (24 hr) 09:46 09:59 - - - - - - 11:29 11:40 [name] Length: 114 (minutes) [name] Segments: 4 Vital Signs Capillary Blood Glucose Reference Range: 80 - 120 mg / dl HBO Diabetic Blood Glucose Intervention Range: <131 mg/dl or >253 mg/dl Type: Time Vitals Blood Respiratory Capillary Blood Glucose Pulse Action Pulse: Temperature: Taken: Pressure: Rate: Glucose (mg/dl): Meter #: Oximetry (%) Taken: Pre 09:32 160/81 72 18 98.2 110 none per protocol Post 11:43 205/75 56 18 97.8 108 none per protocol [name] Response [name] Toleration: Well [name] Completion Status: [name] Completed without Adverse Event [name] Notes Mr. Montreuil arrived and prepared for [name]. Blood glucose level was 110 mg/dL.  Patient's blood glucose level is well-controlled with his medication to the point that he has difficulty maintaining higher glucose level for HBOT long enough to achieve test levels for the glycemic intervention protocol. Per physician orders, patient only undergoes a blood glucose level test and as long as he is asymptomatic for hypoglycemia, patient is cleared for [name]. Patient prepared for [name]. After performing a safety check, patient was placed in the chamber which was compressed with 100% oxygen at a rate of 2 psi/min after confirming normal ear equalization. 4 oz cranberry juice was sent in for safety/intervention. He tolerated the [name] and subsequent decompression of the chamber at a rate of 2 psi/min. He denied any issues with ear equalization and/or pain associated with barotrauma. Post-[name] vital signs were out of range with 205/75 mmHg blood pressure and heart rate of 56 bpm. He denied symptoms associated with bradycardia and/or hypertension. He stated that he felt fine. He was stable upon discharge. Jose [name] Notes No concerns with [name] given Physician HBO Attestation: I certify that I supervised this HBO [name] in accordance with Medicare guidelines. A trained emergency response team is readily available per Yes hospital policies and procedures. Continue HBOT as ordered. [address] Jose [name], Jose [name] ([id number]) [id number] Page 2 of 2 Electronic Signature(s) Signed: 01/16/2023 10:44:47 AM By: Jose Najjar MD Previous Signature: 01/15/2023 2:08:37 PM Version By: Jose [name] CHT EMT BS , , Entered By: Jose [name] on 01/15/2023 12:54:39 -------------------------------------------------------------------------------- HBO Safety Checklist Details Patient Name: Date of Service: Jose [name] MES D. 01/15/2023 10:00 A M Medical Record Number: [id number] Patient Account Number: [id number] Date of Birth/Sex: Treating RN: [birth year] (72 y.o.  Jose [name] Primary Care Jose [name]: [name] Other Clinician: Haywood [name] Referring Jose [name]: Treating Shrika [name]: Jose [name]: 6 HBO Safety Checklist Items Safety Checklist Consent Form Signed  Patient voided / foley secured and emptied When did you last eato 0700 - Breakfast Last dose of injectable or oral agent 0710 Ostomy pouch emptied and vented if applicable NA All implantable devices assessed, documented and approved NA Intravenous access site secured and place NA Valuables secured Linens and cotton and cotton/polyester blend (less than 51% polyester) Personal oil-based products / skin lotions / body lotions removed Wigs or hairpieces removed NA Smoking or tobacco materials removed NA Books / newspapers / magazines / loose paper removed Cologne, aftershave, perfume and deodorant removed Jewelry removed (may wrap wedding band) Make-up removed NA Hair care products removed Battery operated devices (external) removed Heating patches and chemical warmers removed Titanium eyewear removed Nail polish cured greater than 10 hours NA Casting material cured greater than 10 hours NA Hearing aids removed NA Loose dentures or partials removed NA Prosthetics have been removed NA Patient demonstrates correct use of air break device (if applicable) Patient concerns have been addressed Patient grounding bracelet on and cord attached to chamber Specifics for Inpatients (complete in addition to above) Medication sheet sent with patient NA Intravenous medications needed or due during therapy sent with patient NA Drainage tubes (e.g. nasogastric tube or chest tube secured and vented) NA Endotracheal or Tracheotomy tube secured NA Cuff deflated of air and inflated with saline NA Airway suctioned NA Notes Paper version used prior to [name] start. [address], [address] [employer], [place], [address] of  grits, Raisin English Muffin, Buttered, OJ Protein shake. Electronic Signature(s) Signed: 01/15/2023 2:02:54 PM By: Jose [name] CHT EMT BS , , Entered By: Jose [name] on 01/15/2023 11:02:54

## 2023-01-15 NOTE — Progress Notes (Addendum)
RAMSAY, HALLOWAY ([id number]) [id number] Page 1 of 2 Visit Report for 01/15/2023 Arrival Information Details Patient Name: Date of Service: Jose [name], [name] 01/15/2023 10:00 A M Medical Record Number: [id number] Patient Account Number: [id number] Date of Birth/Sex: Treating RN: 1950-07-28 (72 y.o. Jose [name] Primary Care Jose [name]: Jose [name] Other Clinician: Haywood [name] Referring Jose [name]: Treating Jose [name]/Extender: Jose [name] in Treatment: 6 Visit Information History Since Last Visit All ordered tests and consults were completed: Yes Patient Arrived: Ambulatory Added or deleted any medications: No Arrival Time: 09:19 Any new allergies or adverse reactions: No Accompanied By: self Had a fall or experienced change in No Transfer Assistance: None activities of daily living that may affect Patient Identification Verified: Yes risk of falls: Secondary Verification Process Completed: Yes Signs or symptoms of abuse/neglect since last visito No Patient Requires Transmission-Based Precautions: No Hospitalized since last visit: No Patient Has Alerts: No Implantable device outside of the clinic excluding No cellular tissue based products placed in the center since last visit: Pain Present Now: No Electronic Signature(s) Signed: 01/15/2023 1:57:27 PM By: Jose [name] CHT EMT BS , , Entered By: Jose [name] on 01/15/2023 10:57:27 -------------------------------------------------------------------------------- Encounter Discharge Information Details Patient Name: Date of Service: Jose [name] MES D. 01/15/2023 10:00 A M Medical Record Number: [id number] Patient Account Number: [id number] Date of Birth/Sex: Treating RN: 06-19-1950 (72 y.o. Jose [name] Primary Care Jose [name]: Jose [name] Other Clinician: Haywood [name] Referring Jose [name]: Treating Jose [name]/Extender: Jose [name] in Treatment: 6 Encounter Discharge Information Items Discharge Condition: Stable Ambulatory Status: Ambulatory Discharge Destination: Home Transportation: Private Auto Accompanied By: self Schedule Follow-up Appointment: No Clinical Summary of Care: Electronic Signature(s) Signed: 01/15/2023 3:56:30 PM By: Jose [name] CHT EMT BS , , Entered By: Jose [name] on 01/15/2023 12:56:30 [name] ([id number]) [id number] Page 2 of 2 -------------------------------------------------------------------------------- Vitals Details Patient Name: Date of Service: Jose [name], [name] 01/15/2023 10:00 A M Medical Record Number: [id number] Patient Account Number: [id number] Date of Birth/Sex: Treating RN: 12/09/1950 (72 y.o. Jose [name] Primary Care Ayaansh Smail: Jose [name] Other Clinician: Haywood [name] Referring Jose [name]: Treating Jose [name]/Extender: Jose [name] in Treatment: 6 Vital Signs Time Taken: 09:32 Temperature (F): 98.2 Height (in): 66 Pulse (bpm): 72 Weight (lbs): 184.6 Respiratory Rate (breaths/min): 18 Body Mass Index (BMI): 29.8 Blood Pressure (mmHg): 160/81 Capillary Blood Glucose (mg/dl): 244 Reference Range: 80 - 120 mg / dl Electronic Signature(s) Signed: 01/15/2023 1:58:54 PM By: Jose [name] CHT EMT BS , , Entered By: Jose [name] on 01/15/2023 10:58:54

## 2023-01-16 ENCOUNTER — Encounter (HOSPITAL_BASED_OUTPATIENT_CLINIC_OR_DEPARTMENT_OTHER): Payer: Medicare Other | Admitting: Internal Medicine

## 2023-01-16 ENCOUNTER — Encounter (HOSPITAL_BASED_OUTPATIENT_CLINIC_OR_DEPARTMENT_OTHER): Payer: Medicare Other | Admitting: General Surgery

## 2023-01-16 DIAGNOSIS — N3040 Irradiation cystitis without hematuria: Secondary | ICD-10-CM | POA: Diagnosis not present

## 2023-01-16 LAB — GLUCOSE, CAPILLARY: Glucose-Capillary: 92 mg/dL (ref 70–99)

## 2023-01-16 NOTE — Progress Notes (Signed)
Jose [name] ([id number]) [id number] Page 1 of 2 Visit Report for 01/16/2023 Arrival Information Details Patient Name: Date of Service: Jose [name], [name] 01/16/2023 10:30 A M Medical Record Number: [id number] Patient Account Number: [id number] Date of Birth/Sex: Treating RN: 1950-10-15 (72 y.o. Jose [name], Jose [name] Primary Care Riham Polyakov: [name] Other Clinician: Karl Bales Referring Pamla Pangle: Treating Daje Stark/Extender: [name] in Treatment: 6 Visit Information History Since Last Visit All ordered tests and consults were completed: Yes Patient Arrived: Ambulatory Added or deleted any medications: No Arrival Time: 09:22 Any new allergies or adverse reactions: No Accompanied By: Wife Had a fall or experienced change in No Transfer Assistance: None activities of daily living that may affect Patient Identification Verified: Yes risk of falls: Secondary Verification Process Completed: Yes Signs or symptoms of abuse/neglect since last visito No Patient Requires Transmission-Based Precautions: No Hospitalized since last visit: No Patient Has Alerts: No Implantable device outside of the clinic excluding No cellular tissue based products placed in the center since last visit: Pain Present Now: No Electronic Signature(s) Signed: 01/16/2023 1:51:42 PM By: Karl Bales EMT Entered By: Karl Bales on 01/16/2023 10:51:42 -------------------------------------------------------------------------------- Encounter Discharge Information Details Patient Name: Date of Service: Jose [name] MES D. 01/16/2023 10:30 A M Medical Record Number: [id number] Patient Account Number: [id number] Date of Birth/Sex: Treating RN: [birth year] (72 y.o. Jose [name] Primary Care Michie Molnar: [name] Other Clinician: Karl Bales Referring Arlicia Paquette: Treating Lacoya Wilbanks/Extender: [name] in  Treatment: 6 Encounter Discharge Information Items Discharge Condition: Stable Ambulatory Status: Ambulatory Discharge Destination: Home Transportation: Private Auto Accompanied By: None Schedule Follow-up Appointment: Yes Clinical Summary of Care: Electronic Signature(s) Signed: 01/16/2023 2:04:59 PM By: Karl Bales EMT Entered By: Karl Bales on 01/16/2023 11:04:59 Jose [name] ([id number]) [id number] Page 2 of 2 -------------------------------------------------------------------------------- Vitals Details Patient Name: Date of Service: Jose [name], [name] 01/16/2023 10:30 A M Medical Record Number: [id number] Patient Account Number: [id number] Date of Birth/Sex: Treating RN: 1950/03/19 (72 y.o. Jose [name] Primary Care Correy Weidner: [name] Other Clinician: Karl Bales Referring Jameyah Fennewald: Treating Wiatt Mahabir/Extender: [name] in Treatment: 6 Vital Signs Time Taken: 09:56 Temperature (F): 98.8 Height (in): 66 Pulse (bpm): 59 Weight (lbs): 184.6 Respiratory Rate (breaths/min): 16 Body Mass Index (BMI): 29.8 Blood Pressure (mmHg): 129/67 Capillary Blood Glucose (mg/dl): 92 Reference Range: 80 - 120 mg / dl Electronic Signature(s) Signed: 01/16/2023 2:00:55 PM By: Karl Bales EMT Entered By: Karl Bales on 01/16/2023 11:00:55

## 2023-01-16 NOTE — Progress Notes (Addendum)
LIM, JANET ([id number]) [id number] Page 1 of 1 Visit Report for 01/15/2023 SuperBill Details Patient Name: Date of Service: Jose [name], Jose [name] MES D. 01/15/2023 Medical Record Number: [id number] Patient Account Number: [id number] Date of Birth/Sex: Treating RN: Nov 05, 1950 (72 y.o. 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 R35.0 Frequency of micturition R30.0 Dysuria C61 Malignant neoplasm of prostate Physician Procedures Quantity CPT4 Code Description Modifier [id number] - WC PHYS HYPERBARIC OXYGEN THERAPY 1 ICD-10 Diagnosis Description 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

## 2023-01-16 NOTE — Progress Notes (Signed)
Jose, [name] ([id number]) [id number] Page 1 of 2 Visit Report for 01/16/2023 HBO Details Patient Name: Date of Service: Jose [name], Jose [name]. 01/16/2023 10:30 A M Medical Record Number: [id number] Patient Account Number: [id number] Date of Birth/Sex: Treating RN: February 27, 1951 (72 y.o. Jose [name], Jose [name] Primary Care Jose [name]: Jose [name] Other Clinician: Karl [name] Referring Jose [name]: Treating Jose [name]/Extender: Jose [name] in Treatment: 6 HBO Treatment Course Details Treatment Course Number: 1 Ordering Jose [name]: Jose [name] T Treatments Ordered: otal 40 HBO Treatment Start Date: 12/17/2022 HBO Indication: Late Effect of Radiation HBO Treatment Details Treatment Number: 21 Patient Type: Outpatient Chamber Type: Monoplace Chamber Serial #: [id number] Treatment Protocol: 2.0 ATA with 90 minutes oxygen, and no air breaks Treatment Details Compression Rate Down: 2.0 psi / minute De-Compression Rate Up: 2.0 psi / minute Air breaks and breathing Decompress Decompress Compress Tx Pressure Begins Reached periods Begins Ends (leave unused spaces blank) Chamber Pressure (ATA 1 2 ------2 1 ) Clock Time (24 hr) 10:31 10:40 - - - - - - 12:10 12:18 Treatment Length: 107 (minutes) Treatment Segments: 4 Vital Signs Capillary Blood Glucose Reference Range: 80 - 120 mg / dl HBO Diabetic Blood Glucose Intervention Range: <131 mg/dl or >308 mg/dl Time Vitals Blood Respiratory Capillary Blood Glucose Pulse Action Type: Pulse: Temperature: Taken: Pressure: Rate: Glucose (mg/dl): Meter #: Oximetry (%) Taken: Pre 09:56 129/67 59 16 98.8 92 Post 12:27 167/74 55 16 98.1 83 Treatment Response Treatment Toleration: Well Treatment Completion Status: Treatment Completed without Adverse Event Jose [name] Notes No concerns with treatment given. Patient was also seen for wound care evaluation Physician HBO Attestation: I certify that I  supervised this HBO treatment in accordance with Medicare guidelines. A trained emergency response team is readily available per Yes hospital policies and procedures. Continue HBOT as ordered. Yes Electronic Signature(s) Signed: 01/17/2023 4:06:32 PM By: Jose Najjar MD Previous Signature: 01/16/2023 2:03:35 PM Version By: Jose [name] EMT Entered By: Jose [name] on 01/16/2023 12:34:18 Jose [name] ([id number]) [id number] Page 2 of 2 -------------------------------------------------------------------------------- HBO Safety Checklist Details Patient Name: Date of Service: Jose [name], Jose [name]. 01/16/2023 10:30 A M Medical Record Number: [id number] Patient Account Number: [id number] Date of Birth/Sex: Treating RN: [birth year] (72 y.o. Jose [name], Jose [name] Primary Care Jose [name]: Jose [name] Other Clinician: Karl [name] Referring Jose [name]: Treating Jose [name]/Extender: Jose [name] in Treatment: 6 HBO Safety Checklist Items Safety Checklist Consent Form Signed Patient voided / foley secured and emptied When did you last eato 0645 Last dose of injectable or oral agent 0730 Ostomy pouch emptied and vented if applicable NA All implantable devices assessed, documented and approved NA Intravenous access site secured and place NA Valuables secured Linens and cotton and cotton/polyester blend (less than 51% polyester) Personal oil-based products / skin lotions / body lotions removed Wigs or hairpieces removed NA Smoking or tobacco materials removed Books / newspapers / magazines / loose paper removed Cologne, aftershave, perfume and deodorant removed Jewelry removed (may wrap wedding band) Make-up removed NA Hair care products removed Battery operated devices (external) removed Heating patches and chemical warmers removed Titanium eyewear removed NA Nail polish cured greater than 10 hours NA Casting material cured  greater than 10 hours NA Hearing aids removed NA Loose dentures or partials removed NA Prosthetics have been removed NA Patient demonstrates correct use of air break device (if applicable) Patient concerns have been addressed Patient grounding bracelet on and cord attached to chamber Specifics for Inpatients (complete in addition to  above) Medication sheet sent with patient NA Intravenous medications needed or due during therapy sent with patient NA Drainage tubes (e.g. nasogastric tube or chest tube secured and vented) NA Endotracheal or Tracheotomy tube secured NA Cuff deflated of air and inflated with saline NA Airway suctioned NA Notes The safety checklist was done before treatment was started. Electronic Signature(s) Signed: 01/16/2023 2:01:56 PM By: Jose [name] EMT Entered By: Jose [name] on 01/16/2023 11:01:55

## 2023-01-17 ENCOUNTER — Encounter (HOSPITAL_BASED_OUTPATIENT_CLINIC_OR_DEPARTMENT_OTHER): Payer: Medicare Other | Admitting: Internal Medicine

## 2023-01-17 DIAGNOSIS — N3040 Irradiation cystitis without hematuria: Secondary | ICD-10-CM | POA: Diagnosis not present

## 2023-01-17 LAB — GLUCOSE, CAPILLARY
Glucose-Capillary: 83 mg/dL (ref 70–99)
Glucose-Capillary: 74 mg/dL (ref 70–99)
Glucose-Capillary: 88 mg/dL (ref 70–99)

## 2023-01-17 NOTE — Progress Notes (Addendum)
KHALIK, HAYLEY ([id number]) [id number] Page 1 of 2 Visit Report for 01/16/2023 Problem List Details Patient Name: Date of Service: Jose [name], [name] 01/16/2023 10:30 A M Medical Record Number: [id number] Patient Account Number: [id number] Date of Birth/Sex: Treating RN: 30-Mar-1950 (72 y.o. 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 01/16/2023 Medical Record Number: [id number] Patient Account Number: [id number] Date of Birth/Sex: Treating RN: [birth year] (72 y.o. 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 R35.0 Frequency of micturition Jose [name], Jose [name] ([id number]) [id number] Page 2 of 2 R30.0 Dysuria C61 Malignant neoplasm of prostate Facility Procedures : CPT4 Code Description: [id number] G0277-(Facility Use Only) HBOT full body chamber, , ICD-10 Diagnosis Description N30.40 Irradiation cystitis without  hematuria R35.0 Frequency of micturition R30.0 Dysuria C61 Malignant neoplasm of prostate Modifier: Quantity: 4 Physician Procedures : CPT4 Code Description Modifier [id number] 99183 - WC PHYS HYPERBARIC OXYGEN THERAPY ICD-10 Diagnosis Description 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

## 2023-01-17 NOTE — Progress Notes (Signed)
GRABIEL, FINUCAN ([id number]) [id number] Page 1 of 6 Visit Report for 01/16/2023 Arrival Information Details Patient Name: Date of Service: Jose [name], [name] 01/16/2023 9:45 A M Medical Record Number: [id number] Patient Account Number: [id number] Date of Birth/Sex: Treating RN: [birth year] (72 y.o. Jose [name] Primary Care Jose [name]: Jose [name] Other Clinician: Referring Kalise Fickett: Treating Lacinda Curvin/Extender: [name] in Treatment: 6 Visit Information History Since Last Visit Added or deleted any medications: No Patient Arrived: Ambulatory Any new allergies or adverse reactions: No Arrival Time: 09:29 Had a fall or experienced change in No Accompanied By: wife activities of daily living that may affect Transfer Assistance: None risk of falls: Patient Identification Verified: Yes Signs or symptoms of abuse/neglect since last visito No Secondary Verification Process Completed: Yes Hospitalized since last visit: No Patient Requires Transmission-Based Precautions: No Implantable device outside of the clinic excluding No Patient Has Alerts: No cellular tissue based products placed in the center since last visit: Pain Present Now: No Electronic Signature(s) Signed: 01/16/2023 3:50:36 PM By: Redmond Pulling RN, BSN Entered By: Redmond Pulling on 01/16/2023 06:30:10 -------------------------------------------------------------------------------- Clinic Level of Care Assessment Details Patient Name: Date of Service: Jose Sousa MES D. 01/16/2023 9:45 A M Medical Record Number: [id number] Patient Account Number: [id number] Date of Birth/Sex: Treating RN: Feb 15, 1951 (72 y.o. Jose [name] Primary Care Melaysia Streed: Jose [name] Other Clinician: Referring Sung Renton: Treating Vivia Rosenburg/Extender: [name] in Treatment: 6 Clinic Level of Care Assessment Items TOOL 4 Quantity Score X- 1 0 Use  when only an EandM is performed on FOLLOW-UP visit ASSESSMENTS - Nursing Assessment / Reassessment X- 1 10 Reassessment of Co-morbidities (includes updates in patient status) X- 1 5 Reassessment of Adherence to Treatment Plan ASSESSMENTS - Wound and Skin A ssessment / Reassessment []  - 0 Simple Wound Assessment / Reassessment - one wound []  - 0 Complex Wound Assessment / Reassessment - multiple wounds []  - 0 Dermatologic / Skin Assessment (not related to wound area) ASSESSMENTS - Focused Assessment []  - 0 Circumferential Edema Measurements - multi extremities []  - 0 Nutritional Assessment / Counseling / Intervention []  - 0 Lower Extremity Assessment (monofilament, tuning fork, pulses) Jose [name], Jose [name] ([id number]) [id number] Page 2 of 6 []  - 0 Peripheral Arterial Disease Assessment (using hand held doppler) ASSESSMENTS - Ostomy and/or Continence Assessment and Care []  - 0 Incontinence Assessment and Management []  - 0 Ostomy Care Assessment and Management (repouching, etc.) PROCESS - Coordination of Care X - Simple Patient / Family Education for ongoing care 1 15 []  - 0 Complex (extensive) Patient / Family Education for ongoing care X- 1 10 Staff obtains Chiropractor, Records, T Results / Process Orders est []  - 0 Staff telephones HHA, Nursing Homes / Clarify orders / etc []  - 0 Routine Transfer to another Facility (non-emergent condition) []  - 0 Routine Hospital Admission (non-emergent condition) []  - 0 New Admissions / Manufacturing engineer / Ordering NPWT Apligraf, etc. , []  - 0 Emergency Hospital Admission (emergent condition) []  - 0 Simple Discharge Coordination []  - 0 Complex (extensive) Discharge Coordination PROCESS - Special Needs []  - 0 Pediatric / Minor Patient Management []  - 0 Isolation Patient Management []  - 0 Hearing / Language / Visual special needs []  - 0 Assessment of Community assistance (transportation, D/C planning,  etc.) []  - 0 Additional assistance / Altered mentation []  - 0 Support Surface(s) Assessment (bed, cushion, seat, etc.) INTERVENTIONS - Wound Cleansing / Measurement []  - 0 Simple Wound Cleansing -  one wound []  - 0 Complex Wound Cleansing - multiple wounds []  - 0 Wound Imaging (photographs - any number of wounds) []  - 0 Wound Tracing (instead of photographs) []  - 0 Simple Wound Measurement - one wound []  - 0 Complex Wound Measurement - multiple wounds INTERVENTIONS - Wound Dressings []  - 0 Small Wound Dressing one or multiple wounds []  - 0 Medium Wound Dressing one or multiple wounds []  - 0 Large Wound Dressing one or multiple wounds []  - 0 Application of Medications - topical []  - 0 Application of Medications - injection INTERVENTIONS - Miscellaneous []  - 0 External ear exam []  - 0 Specimen Collection (cultures, biopsies, blood, body fluids, etc.) []  - 0 Specimen(s) / Culture(s) sent or taken to Lab for analysis []  - 0 Patient Transfer (multiple staff / Nurse, adult / Similar devices) []  - 0 Simple Staple / Suture removal (25 or less) []  - 0 Complex Staple / Suture removal (26 or more) []  - 0 Hypo / Hyperglycemic Management (close monitor of Blood Glucose) []  - 0 Ankle / Brachial Index (ABI) - do not check if billed separately Jose [name], Jose [name] ([id number]) [id number] Page 3 of 6 X- 1 5 Vital Signs Has the patient been seen at the hospital within the last three years: Yes Total Score: 45 Level Of Care: New/Established - Level 2 Electronic Signature(s) Signed: 01/16/2023 3:50:36 PM By: Redmond Pulling RN, BSN Entered By: Redmond Pulling on 01/16/2023 06:48:58 -------------------------------------------------------------------------------- Encounter Discharge Information Details Patient Name: Date of Service: Jose Sousa MES D. 01/16/2023 9:45 A M Medical Record Number: [id number] Patient Account Number: [id number] Date of Birth/Sex: Treating  RN: 26-Oct-1950 (72 y.o. Jose [name] Primary Care Eidan Muellner: Jose [name] Other Clinician: Referring Dhriti Fales: Treating Jamarea Selner/Extender: [name] in Treatment: 6 Encounter Discharge Information Items Discharge Condition: Stable Ambulatory Status: Ambulatory Discharge Destination: Other (Note Required) Telephoned: No Orders Sent: No Transportation: Private Auto Accompanied By: wife Schedule Follow-up Appointment: Yes Clinical Summary of Care: Patient Declined Notes pt going for HBO treatment after nurse visit Electronic Signature(s) Signed: 01/16/2023 3:50:36 PM By: Redmond Pulling RN, BSN Entered By: Redmond Pulling on 01/16/2023 06:50:00 -------------------------------------------------------------------------------- Multi Wound Chart Details Patient Name: Date of Service: Jose Sousa MES D. 01/16/2023 9:45 A M Medical Record Number: [id number] Patient Account Number: [id number] Date of Birth/Sex: Treating RN: 01-02-1951 (72 y.o. M) Primary Care Lancelot Alyea: Jose [name] Other Clinician: Referring Nguyet Mercer: Treating Yennifer Segovia/Extender: [name] in Treatment: 6 Vital Signs Height(in): 66 Capillary Blood Glucose(mg/dl): 016 Weight(lbs): 010.9 Pulse(bpm): 64 Body Mass Index(BMI): 29.8 Blood Pressure(mmHg): 138/86 Temperature(F): 98.4 Respiratory Rate(breaths/min): [address] ([id number]) [Treatment Notes:Electronic Signature(s) Signed: 01/17/2023 4:06:32 PM By: Baltazar Najjar MD] -------------------------------------------------------------------------------- Multi-Disciplinary Care Plan Details Patient Name: Date of Service: Jose Sousa MES D. 01/16/2023 9:45 A M Medical Record Number: [id number] Patient Account Number: [id number] Date of Birth/Sex: Treating RN: 04-13-1950 (72 y.o. Jose [name] Primary Care Denim Start: Jose [name] Other Clinician: Referring Ludia Gartland: Treating  Gabriele Loveland/Extender: [name] in Treatment: 6 Active Inactive HBO Nursing Diagnoses: Anxiety related to feelings of confinement associated with the hyperbaric oxygen chamber Anxiety related to knowledge deficit of hyperbaric oxygen therapy and treatment procedures Discomfort related to temperature and humidity changes inside hyperbaric chamber Potential for barotraumas to ears, sinuses, teeth, and lungs or cerebral gas embolism related to changes in atmospheric pressure inside hyperbaric oxygen chamber Potential for oxygen toxicity seizures related to delivery of 100% oxygen at an  increased atmospheric pressure Potential for pulmonary oxygen toxicity related to delivery of 100% oxygen at an increased atmospheric pressure Goals: Barotrauma will be prevented during HBO2 Date Initiated: 12/04/2022 Target Resolution Date: 02/01/2023 Goal Status: Active Patient and/or family will be able to state/discuss factors appropriate to the management of their disease process during treatment Date Initiated: 12/04/2022 Date Inactivated: 01/16/2023 Target Resolution Date: 01/10/2023 Goal Status: Met Patient will tolerate the hyperbaric oxygen therapy treatment Date Initiated: 12/04/2022 Target Resolution Date: 02/01/2023 Goal Status: Active Patient will tolerate the internal climate of the chamber Date Initiated: 12/04/2022 Target Resolution Date: 02/01/2023 Goal Status: Active Interventions: Administer a five (5) minute air break for patient if signs and symptoms of seizure appear and notify the hyperbaric physician Administer a ten (10) minute air break for patient if signs and symptoms of seizure appear and notify the hyperbaric physician Administer decongestants, per physician orders, prior to HBO2 Administer the correct therapeutic gas delivery based on the patients needs and limitations, per physician order Assess and provide for patients comfort related to the  hyperbaric environment and equalization of middle ear Assess for signs and symptoms related to adverse events, including but not limited to confinement anxiety, pneumothorax, oxygen toxicity and baurotrauma Notes: Electronic Signature(s) Signed: 01/16/2023 3:50:36 PM By: Redmond Pulling RN, BSN Entered By: Redmond Pulling on 01/16/2023 06:40:56 Pain Assessment Details -------------------------------------------------------------------------------- Jose [name] ([id number]) [id number] Page 5 of 6 Patient Name: Date of Service: Jose [name], [name] 01/16/2023 9:45 A M Medical Record Number: [id number] Patient Account Number: [id number] Date of Birth/Sex: Treating RN: Aug 13, 1950 (72 y.o. Jose [name] Primary Care Anaira Seay: Jose [name] Other Clinician: Referring Najae Filsaime: Treating Boston Cookson/Extender: [name] in Treatment: 6 Active Problems Location of Pain Severity and Description of Pain Patient Has Paino No Site Locations Pain Management and Medication Current Pain Management: Electronic Signature(s) Signed: 01/16/2023 3:50:36 PM By: Redmond Pulling RN, BSN Entered By: Redmond Pulling on 01/16/2023 06:31:24 -------------------------------------------------------------------------------- Patient/Caregiver Education Details Patient Name: Date of Service: Jose Sousa MES D. 11/13/2024andnbsp9:45 A M Medical Record Number: [id number] Patient Account Number: [id number] Date of Birth/Gender: Treating RN: [birth year] (72 y.o. Jose [name] Primary Care Physician: Jose [name] Other Clinician: Referring Physician: Treating Physician/Extender: [name] in Treatment: 6 Education Assessment Education Provided To: Patient Education Topics Provided Wound/Skin Impairment: Methods: Explain/Verbal Responses: State content correctly Electronic Signature(s) Signed: 01/16/2023 3:50:36 PM By:  Redmond Pulling RN, BSN Entered By: Redmond Pulling on 01/16/2023 06:41:51 Jose [name] ([id number]) [id number] Page 6 of 6 -------------------------------------------------------------------------------- Vitals Details Patient Name: Date of Service: Jose [name], Jose [name] MES D. 01/16/2023 9:45 A M Medical Record Number: [id number] Patient Account Number: [id number] Date of Birth/Sex: Treating RN: 25-Apr-1950 (72 y.o. Jose [name] Primary Care Lania Zawistowski: Jose [name] Other Clinician: Referring Bolden Hagerman: Treating Jose [name]/Extender: [name] in Treatment: 6 Vital Signs Time Taken: 09:30 Temperature (F): 98.4 Height (in): 66 Pulse (bpm): 64 Weight (lbs): 184.6 Respiratory Rate (breaths/min): 18 Body Mass Index (BMI): 29.8 Blood Pressure (mmHg): 138/86 Capillary Blood Glucose (mg/dl): 016 Reference Range: 80 - 120 mg / dl Electronic Signature(s) Signed: 01/16/2023 3:50:36 PM By: Redmond Pulling RN, BSN Entered By: Redmond Pulling on 01/16/2023 06:31:18

## 2023-01-17 NOTE — Progress Notes (Signed)
IZZIAH, [name] ([id number]) [id number] Page 1 of 5 Visit Report for 01/16/2023 HPI Details Patient Name: Date of Service: Jose [name], Jose [name] MES D. 01/16/2023 9:45 A M Medical Record Number: [id number] Patient Account Number: [id number] Date of Birth/Sex: Treating RN: 29-Mar-1950 (72 y.o. 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 He underwent radiation treatment for 5 weeks from 04/25/2021 through 06/02/2021 receiving 70 GY in 28 fractions of 2.5GY. He tolerated this well. He had some hematuria after the radiation but hematuria has not been the major part of his symptom complex. Basically he does have significant urinary frequency and difficulty emptying his bladder. He does In-N-Out caths twice a day obtaining about 100 cc of urine. But he says even that does not help the urinary frequency at night and when she is up every 15 minutes or so. Earlier this year he underwent urodynamic testing showing that his bladder capacity was 120 cc he had detrusor instability. He is also bothered by suprapubic and pelvic pain with voiding or sometimes without it. He has had no fever no chills no signs of an upper urinary tract infection. He has had continued significant significant symptoms in spite of trials of tamsulosin. Because of the voiding difficulties he underwent a cystoscopy on 11/22/2022. 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 The bladder itself had mild trabeculations some hypervascularity and inflammation consistent with radiation cystitis no tumors  and no stones. He was put on antibiotics. Note from his urologist Dr. 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 Significant inflammatory change of the bladder neck which was felt to be possibly contributing to his pain and discomfort. Urinary frequency, and he lists intermittent gross hematuria. He was referred here for hyperbaric oxygen. Past medical history includes type 2 diabetes, hypertension, coronary artery disease with stent followed by Dr. Excell Seltzer, prostate CA in 2023. ADDENDUM; 12/05/2022 I have reviewed epic. Recent note from Dr. Excell Seltzer shows a previous echocardiogram on 02/21/2022 with an ejection fraction of 70 to 75% no additional issues. 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 The patient does not currently have any anginal symptoms although he has a very limited activity. ALSO; I see nothing about the cause any history of seizures that he describes this was in the early 2000's. The patient did not say he saw neurology but he states there was a quandary between the possibility of seizures and TIAs. He was treated with anticonvulsants but they are not sure what which 1. Not currently on any medications for seizures and he has not had any symptoms since then 11/13; patient is completing his 21st dive today which she will do after this visit. 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 He does not have a major bleeding issue, his predominant symptoms are significant lower abdominal pressure/pain as well as urinary frequency. He is up 3-4 times at night to void. He does an In-N-Out catheter in the morning but only gets a quarter of a cup. Sometimes he does this before he goes to bed. He thinks his frequency is somewhat better with the HBO  has not really noticed a major change with the discomfort Electronic Signature(s) Signed: 01/17/2023 4:06:32 PM By: Baltazar Najjar MD Entered By: Baltazar Najjar on 01/16/2023  07:17:28 -------------------------------------------------------------------------------- Physical Exam Details Patient Name: Date of Service: Jose [name] MES D. 01/16/2023 9:45 A M Medical Record Number: [id number] Patient Account Number: [id number] Date of Birth/Sex: Treating RN: [birth year] (72 y.o. M) Primary Care Provider: [name] Other Clinician: Referring Provider: Treating Provider/Extender: Leane Call , Jacquelin Hawking in Treatment: 6 Constitutional Sitting or standing Blood Pressure is within target range for patient.. Pulse regular and within target range for patient.Marland Kitchen Respirations regular, non-labored and within target range.. Temperature is normal and within the target range for the patient.Marland Kitchen Appears in no distress. MAKARI, ROPER ([id number]) [id number] Page 2 of 5 Electronic Signature(s) Signed: 01/17/2023 4:06:32 PM By: Baltazar Najjar MD Entered By: Baltazar Najjar on 01/16/2023 07:17:46 -------------------------------------------------------------------------------- Physician Orders Details Patient Name: Date of Service: Jose [name] MES D. 01/16/2023 9:45 A M Medical Record Number: [id number] Patient Account Number: [id number] Date of Birth/Sex: Treating RN: [birth year] (72 y.o. [name] Primary Care Provider: [name] Other Clinician: Referring Provider: Treating Provider/Extender: [name] in Treatment: 6 Verbal / Phone Orders: No Diagnosis Coding Follow-up Appointments Other: - Dr Leanord Hawking will speak with you at HBO to re-evaluate at 30 treatments Hyperbaric Oxygen Therapy Evaluate for HBO Therapy - 40 treatments Indication: - Radiation cystitis If appropriate for treatment, begin HBOT per protocol: 2.0 ATA for 90 Minutes without A Breaks ir Total Number of Treatments: - 40 One treatments per day (delivered Monday through Friday unless otherwise specified in Special  Instructions below): Finger stick Blood Glucose Pre- and Post- HBOT Treatment. A frin (Oxymetazoline HCL) 0.05% nasal spray - 1 spray in both nostrils daily as needed prior to HBO treatment for difficulty clearing ears Electronic Signature(s) Signed: 01/16/2023 3:50:36 PM By: Redmond Pulling RN, BSN Signed: 01/17/2023 4:06:32 PM By: Baltazar Najjar MD Entered By: Redmond Pulling on 01/16/2023 06:47:20 -------------------------------------------------------------------------------- Problem List Details Patient Name: Date of Service: Jose [name] MES D. 01/16/2023 9:45 A M Medical Record Number: [id number] Patient Account Number: [id number] Date of Birth/Sex: Treating RN: 02/20/1951 (72 y.o. 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 01/16/2023 9:45 A M Medical Record Number: [id number] Patient Account Number: [id number] Date of Birth/Sex: Treating RN: [birth year] (72 y.o. 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 He underwent radiation treatment for 5 weeks from 04/25/2021 through 06/02/2021 receiving 70 GY in 28 fractions of 2.5GY. He tolerated this well. He had some hematuria after the radiation but hematuria has not been the major part of his symptom  complex. Basically he does have significant urinary frequency and difficulty emptying his bladder. He does In-N-Out caths twice a day obtaining about 100 cc of urine. But he says even that does not help the urinary frequency at night and when she is up every 15 minutes or so. Earlier this year he underwent urodynamic testing showing that his bladder capacity was 120 cc he had detrusor instability. He is also bothered by suprapubic and pelvic pain with voiding or sometimes without it. He has had no fever no chills no signs of an upper urinary tract infection. He has had continued significant significant symptoms in spite of trials of tamsulosin. Because of the voiding difficulties he underwent a cystoscopy on 11/22/2022. 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 The bladder itself had mild trabeculations some hypervascularity and inflammation consistent with radiation cystitis no tumors and no stones. He was put on antibiotics. Note from his urologist Dr. 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 Significant inflammatory change of the bladder neck which was felt to be possibly contributing to his pain and discomfort. Urinary frequency, and he lists intermittent gross hematuria. He was referred here for hyperbaric oxygen. Past medical history includes type 2 diabetes, hypertension, coronary artery disease with stent followed by Dr. Excell Seltzer, prostate CA in 2023. ADDENDUM; 12/05/2022 I have reviewed  epic. Recent note from Dr. Excell Seltzer shows a previous echocardiogram on 02/21/2022 with an ejection fraction of 70 to 75% no additional issues. 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 The patient does not currently have any anginal symptoms although he has a very limited activity. ALSO; I see nothing about the cause any history of seizures that he describes this was in the early 2000's. The patient did not say he saw neurology but he states there was a quandary between the possibility of seizures and TIAs. He was treated with anticonvulsants but they are not sure what which 1. Not currently on any medications for seizures and he has not had any symptoms since then 11/13; patient is completing his 21st dive today which she will do after this visit. 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 He does not have a major bleeding issue, his predominant symptoms are significant lower abdominal pressure/pain as well as urinary frequency. He is up 3-4 times at night to void. He does an In-N-Out catheter in the morning but only gets a quarter of a cup. Sometimes he does this before he goes to bed. He thinks his frequency is somewhat better with the HBO has not really noticed a major change with the discomfort Objective Constitutional Sitting or standing Blood Pressure is within target range for patient.. Pulse regular and within target range for patient.Marland Kitchen Respirations regular, non-labored and within target range.. Temperature is normal and within the target range for the patient.Marland Kitchen Appears in no distress. Vitals Time Taken: 9:30 AM, Height: 66 in, Weight: 184.6 lbs, BMI: 29.8, Temperature: 98.4 F, Pulse: 64 bpm, Respiratory Rate: 18 breaths/min, Blood Pressure: 138/86 mmHg, Capillary Blood Glucose: 164 mg/dl. TREW, DESORBO ([id number]) [id number] Page 4 of 5 Assessment Active Problems ICD-10 Irradiation cystitis without hematuria Frequency of  micturition Dysuria Malignant neoplasm of prostate Plan Follow-up Appointments: Other: - Dr Leanord Hawking will speak with you at HBO to re-evaluate at 30 treatments Hyperbaric Oxygen Therapy: Evaluate for HBO Therapy - 40 treatments Indication: - Radiation cystitis  If appropriate for treatment, begin HBOT per protocol: 2.0 ATA for 90 Minutes without Air Breaks T Number of Treatments: - 40 otal One treatments per day (delivered Monday through Friday unless otherwise specified in Special Instructions below): Finger stick Blood Glucose Pre- and Post- HBOT Treatment. Afrin (Oxymetazoline HCL) 0.05% nasal spray - 1 spray in both nostrils daily as needed prior to HBO treatment for difficulty clearing ears 1. Patient is tolerating HBO well. 21st dive today. 2. I explained that this is still too early to have a final thought on the efficacy of HBO. 3. He is tolerating HBO well has no significant issues 4. In terms of his symptoms of radiation cystitis this is predominantly lower abdominal pressure/pain as well as urinary frequency. There not has not been a major change so far. Maybe too early however Electronic Signature(s) Signed: 01/17/2023 4:06:32 PM By: Baltazar Najjar MD Entered By: Baltazar Najjar on 01/16/2023 07:19:19 -------------------------------------------------------------------------------- SuperBill Details Patient Name: Date of Service: Jose [name] MES D. 01/16/2023 Medical Record Number: [id number] Patient Account Number: [id number] Date of Birth/Sex: Treating RN: 1950/12/11 (72 y.o. [name] Primary Care Provider: [name] Other Clinician: Referring Provider: Treating Provider/Extender: [name] in Treatment: 6 Diagnosis Coding ICD-10 Codes Code Description N30.40 Irradiation cystitis without hematuria R35.0 Frequency of micturition R30.0 Dysuria C61 Malignant neoplasm of prostate Facility Procedures : IZAIAH, SKATES Code:  [id number] ES D ([id number]) Description: [phone] - WOUND CARE VISIT-LEV 2 EST PT [phone] Modifier: [phone].pdf Pag Quantity: 1 e 5 of 5 Physician Procedures : CPT4 Code Description Modifier [id number] - WC PHYS LEVEL 3 - EST PT ICD-10 Diagnosis Description N30.40 Irradiation cystitis without hematuria R35.0 Frequency of micturition R30.0 Dysuria Quantity: 1 Electronic Signature(s) Signed: 01/17/2023 4:06:32 PM By: Baltazar Najjar MD Entered By: Baltazar Najjar on 01/16/2023 07:19:50

## 2023-01-17 NOTE — Progress Notes (Addendum)
ROOSVELT, AFRIDI ([id number]) [id number] Page 1 of 1 Visit Report for 01/17/2023 SuperBill Details Patient Name: Date of Service: Jose [name], Jose [name]. 01/17/2023 Medical Record Number: [id number] Patient Account Number: [id number] Date of Birth/Sex: Treating RN: [birth year] (72 y.o. [name] Primary Care Provider: [name] Other Clinician: Haywood Pao Referring Provider: Treating Provider/Extender: [name] in Treatment: 6 Diagnosis Coding ICD-10 Codes Code Description N30.40 Irradiation cystitis without hematuria R35.0 Frequency of micturition R30.0 Dysuria C61 Malignant neoplasm of prostate Facility Procedures CPT4 Code Description Modifier Quantity [id number]-(Facility Use Only) HBOT full body chamber, , 4 ICD-10 Diagnosis Description N30.40 Irradiation cystitis without hematuria R35.0 Frequency of micturition R30.0 Dysuria C61 Malignant neoplasm of prostate Physician Procedures Quantity CPT4 Code Description Modifier [id number] - WC PHYS HYPERBARIC OXYGEN THERAPY 1 ICD-10 Diagnosis Description N30.40 Irradiation cystitis without hematuria R35.0 Frequency of micturition R30.0 Dysuria C61 Malignant neoplasm of prostate Electronic Signature(s) Signed: 01/28/2023 3:22:54 PM By: Pearletha Alfred Signed: 01/28/2023 3:45:26 PM By: Baltazar Najjar MD Previous Signature: 01/17/2023 2:07:28 PM Version By: Haywood Pao CHT EMT BS , , Previous Signature: 01/17/2023 4:06:32 PM Version By: Baltazar Najjar MD Entered By: Pearletha Alfred on 01/28/2023 15:22:54

## 2023-01-17 NOTE — Progress Notes (Signed)
ALECZANDER, MAHON ([id number]) [id number] Page 1 of 2 Visit Report for 01/17/2023 Arrival Information Details Patient Name: Date of Service: Jose [name], [name] 01/17/2023 10:00 A M Medical Record Number: [id number] Patient Account Number: [id number] Date of Birth/Sex: Treating RN: 08-07-1950 (72 y.o. [name] Primary Care Hayzen Lorenson: [name] Other Clinician: Haywood Pao Referring Zandyr Barnhill: Treating Johannes Everage/Extender: [name] in Treatment: 6 Visit Information History Since Last Visit All ordered tests and consults were completed: Yes Patient Arrived: Ambulatory Added or deleted any medications: No Arrival Time: 09:30 Any new allergies or adverse reactions: No Accompanied By: self Had a fall or experienced change in No Transfer Assistance: None activities of daily living that may affect Patient Identification Verified: Yes risk of falls: Secondary Verification Process Completed: Yes Signs or symptoms of abuse/neglect since last visito No Patient Requires Transmission-Based Precautions: No Hospitalized since last visit: No Patient Has Alerts: No Implantable device outside of the clinic excluding No cellular tissue based products placed in the center since last visit: Pain Present Now: No Electronic Signature(s) Signed: 01/17/2023 1:54:01 PM By: Haywood Pao CHT EMT BS , , Entered By: Haywood Pao on 01/17/2023 10:54:01 -------------------------------------------------------------------------------- Encounter Discharge Information Details Patient Name: Date of Service: Jose Sousa MES D. 01/17/2023 10:00 A M Medical Record Number: [id number] Patient Account Number: [id number] Date of Birth/Sex: Treating RN: [birth year] (72 y.o. [name] Primary Care Jermal Dismuke: [name] Other Clinician: Haywood Pao Referring Dareion Kneece: Treating Rima Blizzard/Extender: [name] in Treatment: 6 Encounter Discharge Information Items Discharge Condition: Stable Ambulatory Status: Ambulatory Discharge Destination: Home Transportation: Private Auto Accompanied By: self Schedule Follow-up Appointment: No Clinical Summary of Care: Electronic Signature(s) Signed: 01/17/2023 2:07:55 PM By: Haywood Pao CHT EMT BS , , Entered By: Haywood Pao on 01/17/2023 11:07:55 Jose [name] ([id number]) [id number] Page 2 of 2 -------------------------------------------------------------------------------- Vitals Details Patient Name: Date of Service: Jose [name], [name] 01/17/2023 10:00 A M Medical Record Number: [id number] Patient Account Number: [id number] Date of Birth/Sex: Treating RN: 02/28/1951 (72 y.o. [name] Primary Care Maisey Deandrade: [name] Other Clinician: Haywood Pao Referring Fantashia Shupert: Treating Keliah Harned/Extender: [name] in Treatment: 6 Vital Signs Time Taken: 09:57 Temperature (F): 97.9 Height (in): 66 Pulse (bpm): 63 Weight (lbs): 184.6 Respiratory Rate (breaths/min): 18 Body Mass Index (BMI): 29.8 Blood Pressure (mmHg): 123/78 Capillary Blood Glucose (mg/dl): 74 Reference Range: 80 - 120 mg / dl Electronic Signature(s) Signed: 01/17/2023 1:55:22 PM By: Haywood Pao CHT EMT BS , , Entered By: Haywood Pao on 01/17/2023 10:55:21

## 2023-01-17 NOTE — Progress Notes (Addendum)
DIAZ, MATUSIK D ([id number]) [id number] Page 1 of 3 Visit Report for 01/17/2023 HBO Details Patient Name: Date of Service: [name] [name] MES D. 01/17/2023 10:00 A M Medical Record Number: [id number] Patient Account Number: [id number] Date of Birth/Sex: Treating RN: 03/20/1950 (72 y.o. [name] [name] Primary Care Ariz Terrones: [name] Other Clinician: Haywood Pao Referring Justice Milliron: Treating Autrey Human/Extender: [name] in Treatment: 6 HBO Treatment Course Details Treatment Course Number: 1 Ordering Benney [place]: Baltazar Najjar T Treatments Ordered: otal 40 HBO Treatment Start Date: 12/17/2022 HBO Indication: Late Effect of Radiation HBO Treatment Details Treatment Number: 22 Patient Type: Outpatient Chamber Type: Monoplace Chamber Serial #: [id number] Treatment Protocol: 2.0 ATA with 90 minutes oxygen, and no air breaks Treatment Details Compression Rate Down: 1.5 psi / minute De-Compression Rate Up: 2.0 psi / minute Air breaks and breathing Decompress Decompress Compress Tx Pressure Begins Reached periods Begins Ends (leave unused spaces blank) Chamber Pressure (ATA 1 2 ------2 1 ) Clock Time (24 hr) 10:38 10:50 - - - - - - 12:20 12:28 Treatment Length: 110 (minutes) Treatment Segments: 4 Vital Signs Capillary Blood Glucose Reference Range: 80 - 120 mg / dl HBO Diabetic Blood Glucose Intervention Range: <131 mg/dl or >841 mg/dl Type: Time Vitals Blood Respiratory Capillary Blood Glucose Pulse Action Pulse: Temperature: Taken: Pressure: Rate: Glucose (mg/dl): Meter #: Oximetry (%) Taken: Pre 09:57 123/78 63 18 97.9 74 none per protocol Post 12:30 142/71 50 18 98 88 none per protocol Treatment Response Treatment Toleration: Well Treatment Completion Status: Treatment Completed without Adverse Event Treatment Notes Mr. Macgillivray arrived and prepared for treatment. Blood glucose level was 74 mg/dL. Patient's  blood glucose level is well-controlled with his medication to the point that he has difficulty maintaining higher glucose level for HBOT long enough to achieve test levels for the glycemic intervention protocol. Per physician orders, patient only undergoes a blood glucose level test and as long as he is asymptomatic for hypoglycemia, patient is cleared for treatment. Patient prepared for treatment. After performing a safety check, patient was placed in the chamber which was compressed with 100% oxygen at a rate of 2 psi/min after confirming normal ear equalization. 4 oz orange juice was sent in for safety/intervention. He tolerated the treatment and subsequent decompression of the chamber at a rate of 2 psi/min. He denied any issues with ear equalization and/or pain associated with barotrauma. Post-treatment vital signs were within range with and heart rate of 50 bpm. Blood glucose was 88 mg/dL without symptoms of hypoglycemia. He denied symptoms associated with bradycardia, stating that he felt fine. He was stable upon discharge. [name] [name] Notes No concern with treatment given Physician HBO Attestation: I certify that I supervised this HBO treatment in accordance with Medicare guidelines. A trained emergency response team is readily available per Yes hospital policies and procedures. Continue HBOT as ordered. [address] SIRIUS, LUMPP ([id number]) [id number] Page 2 of 3 Electronic Signature(s) Signed: 01/17/2023 4:06:32 PM By: Baltazar Najjar MD Previous Signature: 01/17/2023 2:07:04 PM Version By: Haywood Pao CHT EMT BS , , Entered By: Baltazar Najjar on 01/17/2023 12:25:52 -------------------------------------------------------------------------------- HBO Safety Checklist Details Patient Name: Date of Service: [name] [name] MES D. 01/17/2023 10:00 A M Medical Record Number: [id number] Patient Account Number: [id number] Date of Birth/Sex: Treating RN: [birth year] (72 y.o. [name] [name] Primary Care Gabbi Whetstone: [name] Other Clinician: Haywood Pao Referring Quoc Tome: Treating Carney Saxton/Extender: [name] in Treatment: 6 HBO Safety Checklist Items Safety Checklist Consent Form  Signed Patient voided / foley secured and emptied When did you last eato Breakfast Last dose of injectable or oral agent 0700 Ostomy pouch emptied and vented if applicable NA All implantable devices assessed, documented and approved NA Intravenous access site secured and place NA Valuables secured Linens and cotton and cotton/polyester blend (less than 51% polyester) Personal oil-based products / skin lotions / body lotions removed Wigs or hairpieces removed NA Smoking or tobacco materials removed NA Books / newspapers / magazines / loose paper removed Cologne, aftershave, perfume and deodorant removed Jewelry removed (may wrap wedding band) Make-up removed Hair care products removed Battery operated devices (external) removed Heating patches and chemical warmers removed Titanium eyewear removed Nail polish cured greater than 10 hours Casting material cured greater than 10 hours NA Hearing aids removed NA Loose dentures or partials removed NA Prosthetics have been removed NA Patient demonstrates correct use of air break device (if applicable) Patient concerns have been addressed Patient grounding bracelet on and cord attached to chamber Specifics for Inpatients (complete in addition to above) Medication sheet sent with patient NA Intravenous medications needed or due during therapy sent with patient NA Drainage tubes (e.g. nasogastric tube or chest tube secured and vented) NA Endotracheal or Tracheotomy tube secured NA Cuff deflated of air and inflated with saline NA Airway suctioned NA Notes Paper version used prior to treatment start. Meal: 0515 OJ, 0615 Coffee, 0645 tomato juice, boiled egg, slice of  bacon, buttered toast with strawberry jam, 0745 OJ, 0810 Coffee, 0840 Glucerna. Electronic Signature(s) Signed: 01/17/2023 3:07:23 PM By: Haywood Pao CHT EMT BS , , Previous Signature: 01/17/2023 2:01:13 PM Version By: Haywood Pao CHT EMT BS , , Entered By: Haywood Pao on 01/17/2023 12:07:23 [name] ([id number]) [id number] Page 3 of 3

## 2023-01-18 ENCOUNTER — Encounter (HOSPITAL_BASED_OUTPATIENT_CLINIC_OR_DEPARTMENT_OTHER): Payer: Medicare Other | Admitting: General Surgery

## 2023-01-18 DIAGNOSIS — N3040 Irradiation cystitis without hematuria: Secondary | ICD-10-CM | POA: Diagnosis not present

## 2023-01-18 LAB — GLUCOSE, CAPILLARY
Glucose-Capillary: 87 mg/dL (ref 70–99)
Glucose-Capillary: 60 mg/dL — ABNORMAL LOW (ref 70–99)

## 2023-01-18 NOTE — Progress Notes (Signed)
RYATT, CARODINE ([id number]) [id number] Page 1 of 2 Visit Report for 01/18/2023 Arrival Information Details Patient Name: Date of Service: LARYAN, BOWERSOCK MES D. 01/18/2023 7:30 A M Medical Record Number: [id number] Patient Account Number: [id number] Date of Birth/Sex: Treating RN: [birth year] (72 y.o. M) Primary Care Tarryn Bogdan: [name] Other Clinician: Referring Karlita Lichtman: Treating Marvens Hollars/Extender: Delena Bali Weeks in Treatment: 6 Visit Information History Since Last Visit Added or deleted any medications: No Patient Arrived: Ambulatory Any new allergies or adverse reactions: No Arrival Time: 07:30 Had a fall or experienced change in No Accompanied By: self activities of daily living that may affect Transfer Assistance: None risk of falls: Patient Identification Verified: Yes Signs or symptoms of abuse/neglect since last visito No Secondary Verification Process Completed: Yes Hospitalized since last visit: No Patient Requires Transmission-Based Precautions: No Implantable device outside of the clinic excluding No Patient Has Alerts: No cellular tissue based products placed in the center since last visit: Pain Present Now: No Electronic Signature(s) Signed: 01/18/2023 12:44:54 PM By: Demetria Pore Entered By: Demetria Pore on 01/18/2023 08:43:35 -------------------------------------------------------------------------------- Encounter Discharge Information Details Patient Name: Date of Service: [name] 01/18/2023 7:30 A M Medical Record Number: [id number] Patient Account Number: [id number] Date of Birth/Sex: Treating RN: 10/13/1950 (72 y.o. [name] Primary Care Brittin Belnap: [name] Other Clinician: Daryll Brod Referring Quetzally Callas: Treating Wania Longstreth/Extender: Sigmund Hazel in Treatment: 6 Encounter Discharge Information Items Discharge Condition: Stable Ambulatory  Status: Ambulatory Discharge Destination: Home Transportation: Private Auto Accompanied By: self Schedule Follow-up Appointment: Yes Clinical Summary of Care: Electronic Signature(s) Signed: 01/18/2023 12:44:54 PM By: Francoise Ceo By: Demetria Pore on 01/18/2023 09:08:24 [name] ([id number]) [id number] Page 2 of 2 -------------------------------------------------------------------------------- Vitals Details Patient Name: Date of Service: ZORAIZ, NEGLEY MES D. 01/18/2023 7:30 A M Medical Record Number: [id number] Patient Account Number: [id number] Date of Birth/Sex: Treating RN: 15-Jul-1950 (72 y.o. M) Primary Care Jannah Guardiola: [name] Other Clinician: Daryll Brod Referring Kailen Hinkle: Treating Haadiya Frogge/Extender: Delena Bali Weeks in Treatment: 6 Vital Signs Time Taken: 07:45 Temperature (F): 97.9 Height (in): 66 Pulse (bpm): 66 Weight (lbs): 184.6 Respiratory Rate (breaths/min): 16 Body Mass Index (BMI): 29.8 Blood Pressure (mmHg): 128/60 Capillary Blood Glucose (mg/dl): 87 Reference Range: 80 - 120 mg / dl Electronic Signature(s) Signed: 01/18/2023 12:44:54 PM By: Demetria Pore Entered By: Demetria Pore on 01/18/2023 08:43:45

## 2023-01-18 NOTE — Progress Notes (Signed)
LUSIANO, BOEH [name] ([id number]) [id number] Page 1 of 2 Visit Report for 01/18/2023 HBO Details Patient Name: Date of Service: Jose [name], Jose [name]. 01/18/2023 7:30 A M Medical Record Number: [id number] Patient Account Number: [id number] Date of Birth/Sex: Treating RN: [birth year] (72 y.o. M) Primary Care Kalianne Fetting: [name] Other Clinician: Referring Tito Ausmus: Treating Allene Furuya/Extender: Delena Bali Weeks in Treatment: 6 HBO Treatment Course Details Treatment Course Number: 1 Ordering Davinder Haff: Baltazar Najjar T Treatments Ordered: otal 40 HBO Treatment Start Date: 12/17/2022 HBO Indication: Late Effect of Radiation HBO Treatment Details Treatment Number: 23 Patient Type: Outpatient Chamber Type: Monoplace Chamber Serial #: [id number] Treatment Protocol: 2.0 ATA with 90 minutes oxygen, and no air breaks Treatment Details Compression Rate Down: 2.0 psi / minute De-Compression Rate Up: 2.0 psi / minute Air breaks and breathing Decompress Decompress Compress Tx Pressure Begins Reached periods Begins Ends (leave unused spaces blank) Chamber Pressure (ATA 1 2 ------2 1 ) Clock Time (24 hr) 7:52 8:02 - - - - - - 9:32 9:38 Treatment Length: 106 (minutes) Treatment Segments: 4 Vital Signs Capillary Blood Glucose Reference Range: 80 - 120 mg / dl HBO Diabetic Blood Glucose Intervention Range: <131 mg/dl or >595 mg/dl Time Vitals Blood Respiratory Capillary Blood Glucose Pulse Action Type: Pulse: Temperature: Taken: Pressure: Rate: Glucose (mg/dl): Meter #: Oximetry (%) Taken: Pre 07:45 128/60 66 16 97.9 87 1 non per protocol Post 09:38 157/74 61 16 98 60 1 apple juice given Treatment Response Treatment Completion Status: Treatment Completed without Adverse Event Treatment Notes Apple juice was given to patient after last glucose readings. Kathlene November HBO Psychologist, occupational followed up with patient afterwards Physician HBO Attestation: I  certify that I supervised this HBO treatment in accordance with Medicare guidelines. A trained emergency response team is readily available per Yes hospital policies and procedures. Continue HBOT as ordered. Yes Electronic Signature(s) Signed: 01/18/2023 12:55:39 PM By: Duanne Guess MD FACS Previous Signature: 01/18/2023 12:44:54 PM Version By: Demetria Pore Entered By: Duanne Guess on 01/18/2023 09:55:38 [name] ([id number]) [id number] Page 2 of 2 -------------------------------------------------------------------------------- HBO Safety Checklist Details Patient Name: Date of Service: Jose [name], Jose [name]. 01/18/2023 7:30 A M Medical Record Number: [id number] Patient Account Number: [id number] Date of Birth/Sex: Treating RN: Jun 25, 1950 (72 y.o. M) Primary Care Muranda Coye: [name] Other Clinician: Referring Koni Kannan: Treating Livio Ledwith/Extender: Delena Bali Weeks in Treatment: 6 HBO Safety Checklist Items Safety Checklist Consent Form Signed Patient voided / foley secured and emptied 6 am protein shake sausage egg and cheese When did you last eato biscuit Last dose of injectable or oral agent 6am Ostomy pouch emptied and vented if applicable NA All implantable devices assessed, documented and approved NA Intravenous access site secured and place NA Valuables secured Linens and cotton and cotton/polyester blend (less than 51% polyester) Personal oil-based products / skin lotions / body lotions removed Wigs or hairpieces removed NA Smoking or tobacco materials removed NA Books / newspapers / magazines / loose paper removed Cologne, aftershave, perfume and deodorant removed Jewelry removed (may wrap wedding band) Make-up removed NA Hair care products removed Battery operated devices (external) removed NA Heating patches and chemical warmers removed NA Titanium eyewear removed NA Nail polish cured greater  than 10 hours NA Casting material cured greater than 10 hours NA Hearing aids removed NA Loose dentures or partials removed NA Prosthetics have been removed NA Patient demonstrates correct use of air break device (if applicable) Patient concerns have been addressed Patient grounding  bracelet on and cord attached to chamber Specifics for Inpatients (complete in addition to above) Medication sheet sent with patient NA Intravenous medications needed or due during therapy sent with patient NA Drainage tubes (e.g. nasogastric tube or chest tube secured and vented) NA Endotracheal or Tracheotomy tube secured NA Cuff deflated of air and inflated with saline NA Airway suctioned NA Electronic Signature(s) Signed: 01/18/2023 12:44:54 PM By: Demetria Pore Entered By: Demetria Pore on 01/18/2023 08:44:11

## 2023-01-18 NOTE — Progress Notes (Signed)
AMEER, CABALLEROS ([id number]) [id number] Page 1 of 1 Visit Report for 01/18/2023 SuperBill Details Patient Name: Date of Service: SIMAO, SAPUTO MES D. 01/18/2023 Medical Record Number: [id number] Patient Account Number: [id number] Date of Birth/Sex: Treating RN: Apr 19, 1950 (72 y.o. 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

## 2023-01-21 ENCOUNTER — Encounter (HOSPITAL_BASED_OUTPATIENT_CLINIC_OR_DEPARTMENT_OTHER): Payer: Medicare Other | Admitting: Internal Medicine

## 2023-01-21 DIAGNOSIS — N3040 Irradiation cystitis without hematuria: Secondary | ICD-10-CM | POA: Diagnosis not present

## 2023-01-21 LAB — GLUCOSE, CAPILLARY: Glucose-Capillary: 124 mg/dL — ABNORMAL HIGH (ref 70–99)

## 2023-01-21 NOTE — Progress Notes (Addendum)
JOHM, ADAMIAN ([id number]) [id number] Page 1 of 1 Visit Report for 01/21/2023 SuperBill Details Patient Name: Date of Service: DINH, SAWICKI MES D. 01/21/2023 Medical Record Number: [id number] Patient Account Number: [id number] Date of Birth/Sex: Treating RN: 04-16-1950 (72 y.o. 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 R35.0 Frequency of micturition R30.0 Dysuria C61 Malignant neoplasm of prostate Physician Procedures Quantity CPT4 Code Description Modifier [id number] 99183 - WC PHYS HYPERBARIC OXYGEN THERAPY 1 ICD-10 Diagnosis Description 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

## 2023-01-21 NOTE — Progress Notes (Addendum)
PERFECTO, BUR D ([id number]) [id number] Page 1 of 3 Visit Report for 01/21/2023 HBO Details Patient Name: Date of Service: Jose [name], [name] 01/21/2023 10:00 A M Medical Record Number: [id number] Patient Account Number: [id number] Date of Birth/Sex: Treating RN: 02/03/1951 (72 y.o. [name] Primary Care Haizlee Henton: [name] Other Clinician: Haywood Pao Referring Jacqlyn Marolf: Treating Glenyce Randle/Extender: [name] in Treatment: 6 HBO Treatment Course Details Treatment Course Number: 1 Ordering Rula [place]: Baltazar Najjar T Treatments Ordered: otal 40 HBO Treatment Start Date: 12/17/2022 HBO Indication: Late Effect of Radiation HBO Treatment Details Treatment Number: 24 Patient Type: Outpatient Chamber Type: Monoplace Chamber Serial #: [id number] Treatment Protocol: 2.0 ATA with 90 minutes oxygen, and no air breaks Treatment Details Compression Rate Down: 1.5 psi / minute De-Compression Rate Up: 2.0 psi / minute Air breaks and breathing Decompress Decompress Compress Tx Pressure Begins Reached periods Begins Ends (leave unused spaces blank) Chamber Pressure (ATA 1 2 ------2 1 ) Clock Time (24 hr) 10:21 10:33 - - - - - - 12:03 12:11 Treatment Length: 110 (minutes) Treatment Segments: 4 Vital Signs Capillary Blood Glucose Reference Range: 80 - 120 mg / dl HBO Diabetic Blood Glucose Intervention Range: <131 mg/dl or >308 mg/dl Type: Time Vitals Blood Respiratory Capillary Blood Glucose Pulse Action Pulse: Temperature: Taken: Pressure: Rate: Glucose (mg/dl): Meter #: Oximetry (%) Taken: Pre 09:53 162/95 70 18 97.7 124 none per protocol Post 12:15 194/75 47 18 97.3 89 none per protocol Treatment Response Treatment Toleration: Well Treatment Completion Status: Treatment Completed without Adverse Event Treatment Notes Mr. Solin arrived with normal vital signs. He prepared for treatment. Patient's  blood glucose level is well-controlled with his medication to the point that he has difficulty maintaining higher glucose level for HBOT long enough to achieve test levels for the glycemic intervention protocol. Per physician orders, patient only undergoes a blood glucose level test and as long as he is asymptomatic for hypoglycemia, patient is cleared for treatment. 4 oz of Apple Juice was sent in with patient for safety/intervention. After performing a safety check, he was placed in the chamber which was compressed with 100% oxygen at a rate of 2 psi/min after confirming normal ear equalization. He tolerated the treatment and subsequent decompression of the chamber at a rate of 2 psi/min. Post- treatment vital signs included BP of 194/75 mmHg, heart rate of 47 bpm and glucose of 89 mg/dL. He denied symptoms related to bradycardia and/or hypertension. He denied symptoms related to hypoglycemia as well. He was stable upon discharge. Goldie Dimmer Notes No concerns with treatment given Physician HBO Attestation: I certify that I supervised this HBO treatment in accordance with Medicare guidelines. A trained emergency response team is readily available per Yes hospital policies and procedures. Continue HBOT as ordered. [address] MATVIY, SORANNO ([id number]) [id number] Page 2 of 3 Electronic Signature(s) Signed: 01/23/2023 3:42:58 PM By: Haywood Pao CHT EMT BS , , Signed: 01/23/2023 4:37:54 PM By: Baltazar Najjar MD Previous Signature: 01/21/2023 4:31:26 PM Version By: Baltazar Najjar MD Previous Signature: 01/21/2023 2:54:41 PM Version By: Haywood Pao CHT EMT BS , , Previous Signature: 01/21/2023 2:17:02 PM Version By: Haywood Pao CHT EMT BS , , Entered By: Haywood Pao on 01/23/2023 12:42:58 -------------------------------------------------------------------------------- HBO Safety Checklist Details Patient Name: Date of Service: Jose [name] MES D. 01/21/2023  10:00 A M Medical Record Number: [id number] Patient Account Number: [id number] Date of Birth/Sex: Treating RN: [birth year] (72 y.o. [name] Primary Care Lestat Golob: [name] Other Clinician:  Haywood Pao Referring Madgeline Rayo: Treating Yer Castello/Extender: [name] in Treatment: 6 HBO Safety Checklist Items Safety Checklist Consent Form Signed Patient voided / foley secured and emptied When did you last eato 0700 - Breakfast Last dose of injectable or oral agent 0730 glimepiride Ostomy pouch emptied and vented if applicable NA All implantable devices assessed, documented and approved NA Intravenous access site secured and place NA Valuables secured Linens and cotton and cotton/polyester blend (less than 51% polyester) Personal oil-based products / skin lotions / body lotions removed Wigs or hairpieces removed NA Smoking or tobacco materials removed NA Books / newspapers / magazines / loose paper removed Cologne, aftershave, perfume and deodorant removed Jewelry removed (may wrap wedding band) Make-up removed NA Hair care products removed Battery operated devices (external) removed Heating patches and chemical warmers removed Titanium eyewear removed Nail polish cured greater than 10 hours NA Casting material cured greater than 10 hours NA Hearing aids removed NA Loose dentures or partials removed NA Prosthetics have been removed NA Patient demonstrates correct use of air break device (if applicable) Patient concerns have been addressed Patient grounding bracelet on and cord attached to chamber Specifics for Inpatients (complete in addition to above) Medication sheet sent with patient NA Intravenous medications needed or due during therapy sent with patient NA Drainage tubes (e.g. nasogastric tube or chest tube secured and vented) NA Endotracheal or Tracheotomy tube secured NA Cuff deflated of air and  inflated with saline NA Airway suctioned NA Notes Paper version used prior to treatment start. 0630- coffee, 0700 fried egg, slice of bacon, english muffin with raisins and strawberry jam, 0715 OJ, 0800 Glucerna Electronic Signature(s) Signed: 01/21/2023 2:57:24 PM By: Haywood Pao CHT EMT BS , , Previous Signature: 01/21/2023 2:07:56 PM Version By: Haywood Pao CHT EMT BS , , Marvis Repress D ([id number]) [id number] Page 3 of 3 Entered By: Haywood Pao on 01/21/2023 11:57:24

## 2023-01-21 NOTE — Progress Notes (Addendum)
DERAY, KIMBRO ([id number]) [id number] Page 1 of 2 Visit Report for 01/21/2023 Arrival Information Details Patient Name: Date of Service: Jose [name], [name] 01/21/2023 10:00 A M Medical Record Number: [id number] Patient Account Number: [id number] Date of Birth/Sex: Treating RN: 1951-02-21 (72 y.o. [name] Primary Care Radha Coggins: [name] Other Clinician: Haywood Pao Referring Rosea Dory: Treating Twania Bujak/Extender: [name] in Treatment: 6 Visit Information History Since Last Visit All ordered tests and consults were completed: Yes Patient Arrived: Ambulatory Added or deleted any medications: No Arrival Time: 09:21 Any new allergies or adverse reactions: No Accompanied By: self Had a fall or experienced change in No Transfer Assistance: None activities of daily living that may affect Patient Identification Verified: Yes risk of falls: Secondary Verification Process Completed: Yes Signs or symptoms of abuse/neglect since last visito No Patient Requires Transmission-Based Precautions: No Hospitalized since last visit: No Patient Has Alerts: No Implantable device outside of the clinic excluding No cellular tissue based products placed in the center since last visit: Pain Present Now: No Electronic Signature(s) Signed: 01/21/2023 1:43:13 PM By: Haywood Pao CHT EMT BS , , Entered By: Haywood Pao on 01/21/2023 10:43:13 -------------------------------------------------------------------------------- Encounter Discharge Information Details Patient Name: Date of Service: Jose [name] MES D. 01/21/2023 10:00 A M Medical Record Number: [id number] Patient Account Number: [id number] Date of Birth/Sex: Treating RN: 1950-05-24 (72 y.o. [name] Primary Care Yesennia Hirota: [name] Other Clinician: Haywood Pao Referring Jeter Tomey: Treating Criss Bartles/Extender: [name] in Treatment: 6 Encounter Discharge Information Items Discharge Condition: Stable Ambulatory Status: Ambulatory Discharge Destination: Home Transportation: Private Auto Accompanied By: self Schedule Follow-up Appointment: No Clinical Summary of Care: Electronic Signature(s) Signed: 01/21/2023 2:55:29 PM By: Haywood Pao CHT EMT BS , , Entered By: Haywood Pao on 01/21/2023 11:55:28 [name] ([id number]) [phone].pdf Page 2 of 2 -------------------------------------------------------------------------------- Vitals Details Patient Name: Date of Service: Jose [name], Jose [name] MES D. 01/21/2023 10:00 A M Medical Record Number: [id number] Patient Account Number: [id number] Date of Birth/Sex: Treating RN: November 12, 1950 (72 y.o. [name] Primary Care Denya Buckingham: [name] Other Clinician: Haywood Pao Referring Aileen Amore: Treating Joshlyn Beadle/Extender: [name] in Treatment: 6 Vital Signs Time Taken: 09:53 Temperature (F): 97.7 Height (in): 66 Pulse (bpm): 70 Weight (lbs): 184.6 Respiratory Rate (breaths/min): 18 Body Mass Index (BMI): 29.8 Blood Pressure (mmHg): 162/95 Capillary Blood Glucose (mg/dl): 433 Reference Range: 80 - 120 mg / dl Electronic Signature(s) Signed: 01/21/2023 1:44:09 PM By: Haywood Pao CHT EMT BS , , Entered By: Haywood Pao on 01/21/2023 10:44:09

## 2023-01-22 ENCOUNTER — Encounter (HOSPITAL_BASED_OUTPATIENT_CLINIC_OR_DEPARTMENT_OTHER): Payer: Medicare Other | Admitting: Internal Medicine

## 2023-01-22 DIAGNOSIS — N3040 Irradiation cystitis without hematuria: Secondary | ICD-10-CM | POA: Diagnosis not present

## 2023-01-22 LAB — GLUCOSE, CAPILLARY
Glucose-Capillary: 89 mg/dL (ref 70–99)
Glucose-Capillary: 116 mg/dL — ABNORMAL HIGH (ref 70–99)
Glucose-Capillary: 84 mg/dL (ref 70–99)

## 2023-01-22 NOTE — Progress Notes (Addendum)
Jose, [name] [name] ([id number]) [id number] Page 1 of 2 Visit Report for 01/22/2023 HBO Details Patient Name: Date of Service: Jose [name], Jose [name]. 01/22/2023 10:00 A M Medical Record Number: [id number] Patient Account Number: [id number] Date of Birth/Sex: Treating RN: 16-Oct-1950 (72 y.o. Jose [name] Primary Care Jose [name]: Jose [name] Other Clinician: Karl [name] Referring Jose [name]: Treating Jose [name]/Extender: Jose [name] in Treatment: 7 HBO Treatment Course Details Treatment Course Number: 1 Ordering Jose [name]: Jose [name] T Treatments Ordered: otal 40 HBO Treatment Start Date: 12/17/2022 HBO Indication: Late Effect of Radiation HBO Treatment Details Treatment Number: 25 Patient Type: Outpatient Chamber Type: Monoplace Chamber Serial #: [id number] Treatment Protocol: 2.0 ATA with 90 minutes oxygen, and no air breaks Treatment Details Compression Rate Down: 2.0 psi / minute De-Compression Rate Up: 2.0 psi / minute Air breaks and breathing Decompress Decompress Compress Tx Pressure Begins Reached periods Begins Ends (leave unused spaces blank) Chamber Pressure (ATA 1 2 ------2 1 ) Clock Time (24 hr) 09:44 09:53 - - - - - - 11:23 11:35 Treatment Length: 111 (minutes) Treatment Segments: 4 Vital Signs Capillary Blood Glucose Reference Range: 80 - 120 mg / dl HBO Diabetic Blood Glucose Intervention Range: <131 mg/dl or >629 mg/dl Type: Time Vitals Blood Pulse: Respiratory Temperature: Capillary Blood Glucose Pulse Action Taken: Pressure: Rate: Glucose (mg/dl): Meter #: Oximetry (%) Taken: Pre 09:26 146/77 69 18 97.3 116 Post 11:39 191/84 57 18 98.6 84 Patient given 8 oz apple juice. Treatment Response Treatment Toleration: Well Treatment Completion Status: Treatment Completed without Adverse Event Jose [name] Notes No concerns with treatment given Physician HBO Attestation: I certify that I supervised this  HBO treatment in accordance with Medicare guidelines. A trained emergency response team is readily available per Yes hospital policies and procedures. Continue HBOT as ordered. Yes Electronic Signature(s) Signed: 01/22/2023 4:19:19 PM By: Jose Najjar MD Previous Signature: 01/22/2023 12:38:32 PM Version By: Jose [name] EMT Entered By: Jose [name] on 01/22/2023 12:56:23 Jose [name] ([id number]) [id number] Page 2 of 2 -------------------------------------------------------------------------------- HBO Safety Checklist Details Patient Name: Date of Service: Jose, SANCHO [name] [name]. 01/22/2023 10:00 A M Medical Record Number: [id number] Patient Account Number: [id number] Date of Birth/Sex: Treating RN: October 23, 1950 (72 y.o. Jose [name] Primary Care Jose [name]: Jose [name] Other Clinician: Karl [name] Referring Jose [name]: Treating Jose [name]/Extender: Jose [name] in Treatment: 7 HBO Safety Checklist Items Safety Checklist Consent Form Signed Patient voided / foley secured and emptied When did you last eato 0715 Last dose of injectable or oral agent 0730 Ostomy pouch emptied and vented if applicable NA All implantable devices assessed, documented and approved NA Intravenous access site secured and place NA Valuables secured Linens and cotton and cotton/polyester blend (less than 51% polyester) Personal oil-based products / skin lotions / body lotions removed Wigs or hairpieces removed NA Smoking or tobacco materials removed Books / newspapers / magazines / loose paper removed Cologne, aftershave, perfume and deodorant removed Jewelry removed (may wrap wedding band) Make-up removed NA Hair care products removed Battery operated devices (external) removed Heating patches and chemical warmers removed Titanium eyewear removed NA Nail polish cured greater than 10 hours NA Casting material cured greater than 10  hours NA Hearing aids removed NA Loose dentures or partials removed NA Prosthetics have been removed NA Patient demonstrates correct use of air break device (if applicable) Patient concerns have been addressed Patient grounding bracelet on and cord attached to chamber Specifics for Inpatients (complete in addition to above) Medication  sheet sent with patient NA Intravenous medications needed or due during therapy sent with patient NA Drainage tubes (e.g. nasogastric tube or chest tube secured and vented) NA Endotracheal or Tracheotomy tube secured NA Cuff deflated of air and inflated with saline NA Airway suctioned NA Notes The safety checklist was done before the treatment was started. The patient ate @ 0715 Orange juice, eggs, sausage, cheese biscuit. Electronic Signature(s) Signed: 01/22/2023 12:36:36 PM By: Jose [name] EMT Entered By: Jose [name] on 01/22/2023 09:36:36

## 2023-01-22 NOTE — Progress Notes (Signed)
Jose [name], Jose [name] ([id number]) [id number] Page 1 of 2 Visit Report for 01/22/2023 Arrival Information Details Patient Name: Date of Service: Jose [name], [name] 01/22/2023 10:00 A M Medical Record Number: [id number] Patient Account Number: [id number] Date of Birth/Sex: Treating RN: [birth year] (72 y.o. [name], [name] Primary Care Jermale Crass: [name] Other Clinician: Karl Bales Referring Zachary Nole: Treating Chrstopher Malenfant/Extender: [name] in Treatment: 7 Visit Information History Since Last Visit All ordered tests and consults were completed: Yes Patient Arrived: Ambulatory Added or deleted any medications: No Arrival Time: 09:19 Any new allergies or adverse reactions: No Accompanied By: Wife Had a fall or experienced change in No Transfer Assistance: None activities of daily living that may affect Patient Identification Verified: Yes risk of falls: Secondary Verification Process Completed: Yes Signs or symptoms of abuse/neglect since last visito No Patient Requires Transmission-Based Precautions: No Hospitalized since last visit: No Patient Has Alerts: No Implantable device outside of the clinic excluding No cellular tissue based products placed in the center since last visit: Pain Present Now: No Electronic Signature(s) Signed: 01/22/2023 12:33:11 PM By: Karl Bales EMT Entered By: Karl Bales on 01/22/2023 12:33:10 -------------------------------------------------------------------------------- Encounter Discharge Information Details Patient Name: Date of Service: Jose Sousa MES D. 01/22/2023 10:00 A M Medical Record Number: [id number] Patient Account Number: [id number] Date of Birth/Sex: Treating RN: 07/14/1950 (72 y.o. [name] Primary Care Venba Zenner: [name] Other Clinician: Karl Bales Referring Luka Stohr: Treating Semaja Lymon/Extender: [name] in  Treatment: 7 Encounter Discharge Information Items Discharge Condition: Stable Ambulatory Status: Ambulatory Discharge Destination: Home Transportation: Private Auto Accompanied By: Wife Schedule Follow-up Appointment: Yes Clinical Summary of Care: Electronic Signature(s) Signed: 01/22/2023 12:39:42 PM By: Karl Bales EMT Entered By: Karl Bales on 01/22/2023 12:39:42 [name] ([id number]) [id number] Page 2 of 2 -------------------------------------------------------------------------------- Vitals Details Patient Name: Date of Service: Jose [name], [name] 01/22/2023 10:00 A M Medical Record Number: [id number] Patient Account Number: [id number] Date of Birth/Sex: Treating RN: 07-07-1950 (72 y.o. [name] Primary Care Mikena Masoner: [name] Other Clinician: Karl Bales Referring Dunia Pringle: Treating Airon Sahni/Extender: [name] in Treatment: 7 Vital Signs Time Taken: 09:26 Temperature (F): 97.3 Height (in): 66 Pulse (bpm): 69 Weight (lbs): 184.6 Respiratory Rate (breaths/min): 18 Body Mass Index (BMI): 29.8 Blood Pressure (mmHg): 146/77 Capillary Blood Glucose (mg/dl): 102 Reference Range: 80 - 120 mg / dl Electronic Signature(s) Signed: 01/22/2023 12:33:46 PM By: Karl Bales EMT Entered By: Karl Bales on 01/22/2023 12:33:45

## 2023-01-22 NOTE — Progress Notes (Addendum)
DEVON, COZZA ([id number]) [id number] Page 1 of 2 Visit Report for 01/22/2023 Problem List Details Patient Name: Date of Service: Jose [name], [name] 01/22/2023 10:00 A M Medical Record Number: [id number] Patient Account Number: [id number] Date of Birth/Sex: Treating RN: Apr 18, 1950 (72 y.o. 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 01/22/2023 Medical Record Number: [id number] Patient Account Number: [id number] Date of Birth/Sex: Treating RN: 02/14/1951 (72 y.o. 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 R35.0 Frequency of micturition Jose [name], Jose [name] ([id number]) [phone].pdf Page 2 of 2 R30.0 Dysuria C61 Malignant neoplasm of prostate Facility Procedures : CPT4 Code Description: [id number] G0277-(Facility Use Only) HBOT full body chamber, , ICD-10 Diagnosis Description N30.40 Irradiation cystitis  without hematuria R35.0 Frequency of micturition R30.0 Dysuria C61 Malignant neoplasm of prostate Modifier: Quantity: 4 Physician Procedures : CPT4 Code Description Modifier [id number] - WC PHYS HYPERBARIC OXYGEN THERAPY ICD-10 Diagnosis Description 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

## 2023-01-23 ENCOUNTER — Encounter (HOSPITAL_BASED_OUTPATIENT_CLINIC_OR_DEPARTMENT_OTHER): Payer: Medicare Other | Admitting: Internal Medicine

## 2023-01-23 DIAGNOSIS — N3040 Irradiation cystitis without hematuria: Secondary | ICD-10-CM | POA: Diagnosis not present

## 2023-01-23 LAB — GLUCOSE, CAPILLARY
Glucose-Capillary: 196 mg/dL — ABNORMAL HIGH (ref 70–99)
Glucose-Capillary: 109 mg/dL — ABNORMAL HIGH (ref 70–99)

## 2023-01-23 NOTE — Progress Notes (Signed)
Jose [name], Jose [name] [name] ([id number]) [id number] Page 1 of 3 Visit Report for 01/23/2023 HBO Details Patient Name: Date of Service: Jose [name], Jose [name]. 01/23/2023 9:30 A M Medical Record Number: [id number] Patient Account Number: [id number] Date of Birth/Sex: Treating RN: [birth year] (72 y.o. [name] Primary Care [name]: [name] Other Clinician: Daryll Brod Referring [name]: Treating [name]/Extender: [name] in Treatment: 7 HBO Treatment Course Details Treatment Course Number: 1 Ordering Solveig Fangman: Baltazar Najjar T Treatments Ordered: otal 40 HBO Treatment Start Date: 12/17/2022 HBO Indication: Late Effect of Radiation HBO Treatment Details Treatment Number: 26 Patient Type: Outpatient Chamber Type: Monoplace Chamber Serial #: [id number] Treatment Protocol: 2.0 ATA with 90 minutes oxygen, and no air breaks Treatment Details Compression Rate Down: 2.0 psi / minute De-Compression Rate Up: 2.0 psi / minute Air breaks and breathing Decompress Decompress Compress Tx Pressure Begins Reached periods Begins Ends (leave unused spaces blank) Chamber Pressure (ATA 1 2 ------2 1 ) Clock Time (24 hr) 9:48 9:57 - - - - - - 11:27 11:33 Treatment Length: 105 (minutes) Treatment Segments: 3 Vital Signs Capillary Blood Glucose Reference Range: 80 - 120 mg / dl HBO Diabetic Blood Glucose Intervention Range: <131 mg/dl or >629 mg/dl Type: Time Vitals Blood Respiratory Capillary Blood Glucose Pulse Action Pulse: Temperature: Taken: Pressure: Rate: Glucose (mg/dl): Meter #: Oximetry (%) Taken: Pre 09:45 167/73 66 18 98.2 196 1 none per protocol Post 11:33 199/90 51 18 109 1 non per protocol Treatment Response Treatment Toleration: Well Treatment Completion Status: Treatment Completed without Adverse Event Treatment Notes I certify that I directed and performed operation of said chamber for this treatment.  MScammell Jose [name] arrived with normal vital signs. He prepared for treatment. Patient's blood glucose level is well-controlled with his medication to the point that he has difficulty maintaining higher glucose level for HBOT long enough to achieve test levels for the glycemic intervention protocol. Per physician orders, patient only undergoes a blood glucose level test and as long as he is asymptomatic for hypoglycemia, patient is cleared for treatment. 4 oz of Apple Juice was sent in with patient for safety/intervention. After performing a safety check, he was placed in the chamber which was compressed with 100% oxygen at a rate of 2 psi/min after confirming normal ear equalization. He tolerated the treatment and subsequent decompression of the chamber at a rate of 2 psi/min. Post- treatment vital signs included BP of 199/90 mmHg, heart rate of 50 bpm. He denied symptoms related to bradycardia and/or hypertension, stating that he felt fine. He was stable upon discharge. Jose [name] Notes No concerns with treatment given Physician HBO Attestation: I certify that I supervised this HBO treatment in accordance with Medicare guidelines. A trained emergency response team is readily available per Yes hospital policies and procedures. Continue HBOT as ordered. [address] Jose [name], Jose [name] ([id number]) [id number] Page 2 of 3 Electronic Signature(s) Signed: 01/23/2023 4:37:54 PM By: Baltazar Najjar MD Previous Signature: 01/23/2023 3:40:33 PM Version By: Haywood Pao CHT EMT BS , , Previous Signature: 01/23/2023 2:42:57 PM Version By: Demetria Pore Previous Signature: 01/23/2023 11:01:18 AM Version By: Haywood Pao CHT EMT BS , , Entered By: Baltazar Najjar on 01/23/2023 13:36:01 -------------------------------------------------------------------------------- HBO Safety Checklist Details Patient Name: Date of Service: Jose [name]. 01/23/2023 9:30 A M Medical Record Number:  [id number] Patient Account Number: [id number] Date of Birth/Sex: Treating RN: 07/05/1950 (72 y.o. [name] Primary Care Annalaura Sauseda: [name] Other Clinician: Daryll Brod Referring Kendy Haston: Treating  Izaah Westman/Extender: Marti Sleigh Weeks in Treatment: 7 HBO Safety Checklist Items Safety Checklist Consent Form Signed Patient voided / foley secured and emptied When did you last eato 8:00 sausage egg cheese biscuit, orange juice Last dose of injectable or oral agent 7:20 Ostomy pouch emptied and vented if applicable NA All implantable devices assessed, documented and approved NA Intravenous access site secured and place NA Valuables secured Linens and cotton and cotton/polyester blend (less than 51% polyester) Personal oil-based products / skin lotions / body lotions removed Wigs or hairpieces removed Smoking or tobacco materials removed NA Books / newspapers / magazines / loose paper removed Cologne, aftershave, perfume and deodorant removed Jewelry removed (may wrap wedding band) Make-up removed NA Hair care products removed Battery operated devices (external) removed NA Heating patches and chemical warmers removed NA Titanium eyewear removed NA Nail polish cured greater than 10 hours NA Casting material cured greater than 10 hours NA Hearing aids removed NA Loose dentures or partials removed NA Prosthetics have been removed NA Patient demonstrates correct use of air break device (if applicable) Patient concerns have been addressed Patient grounding bracelet on and cord attached to chamber Specifics for Inpatients (complete in addition to above) Medication sheet sent with patient NA Intravenous medications needed or due during therapy sent with patient NA Drainage tubes (e.g. nasogastric tube or chest tube secured and vented) NA Endotracheal or Tracheotomy tube secured NA Cuff deflated of air and inflated with  saline NA Airway suctioned NA Notes Paper version used prior to treatment start. Electronic Signature(s) Signed: 01/23/2023 10:59:42 AM By: Haywood Pao CHT EMT BS , , Entered By: Haywood Pao on 01/23/2023 07:59:42 Jose [name] ([id number]) [id number] Page 3 of 3

## 2023-01-23 NOTE — Progress Notes (Addendum)
VERNELL, GATES ([id number]) [id number] Page 1 of 1 Visit Report for 01/23/2023 SuperBill Details Patient Name: Date of Service: KINNITH, OMDAHL MES D. 01/23/2023 Medical Record Number: [id number] Patient Account Number: [id number] Date of Birth/Sex: Treating RN: 1950/11/06 (72 y.o. 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 R35.0 Frequency of micturition R30.0 Dysuria C61 Malignant neoplasm of prostate Physician Procedures Quantity CPT4 Code Description Modifier [id number] 99183 - WC PHYS HYPERBARIC OXYGEN THERAPY 1 ICD-10 Diagnosis Description 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

## 2023-01-23 NOTE — Progress Notes (Signed)
Jose [name] ([id number]) [id number] Page 1 of 2 Visit Report for 01/23/2023 Arrival Information Details Patient Name: Date of Service: Jose [name], [name] 01/23/2023 9:30 A M Medical Record Number: [id number] Patient Account Number: [id number] Date of Birth/Sex: Treating RN: Jose [name] (72 y.o. Jose [name] Primary Care Jose [name]: Jose [name] Other Clinician: Daryll [name] Referring Jose [name]: Treating Jose [name]/Extender: Jose [name] in Treatment: 7 Visit Information History Since Last Visit Added or deleted any medications: No Patient Arrived: Ambulatory Any new allergies or adverse reactions: No Arrival Time: 09:45 Had a fall or experienced change in No Accompanied By: self activities of daily living that Jose affect Transfer Assistance: None risk of falls: Patient Identification Verified: Yes Signs or symptoms of abuse/neglect since last visito No Secondary Verification Process Completed: Yes Hospitalized since last visit: No Patient Requires Transmission-Based Precautions: No Implantable device outside of the clinic excluding No Patient Has Alerts: No cellular tissue based products placed in the center since last visit: Pain Present Now: No Electronic Signature(s) Signed: 01/23/2023 10:59:08 AM By: Jose [name] , , Entered By: Jose [name] on 01/23/2023 07:59:08 -------------------------------------------------------------------------------- Encounter Discharge Information Details Patient Name: Date of Service: Jose Sousa MES D. 01/23/2023 9:30 A M Medical Record Number: [id number] Patient Account Number: [id number] Date of Birth/Sex: Treating RN: Jose [name] (72 y.o. Jose [name] Primary Care Jose [name]: Jose [name] Other Clinician: Daryll [name] Referring Jose [name]: Treating Jose [name]/Extender: Jose [name] in Treatment: 7 Encounter Discharge  Information Items Discharge Condition: Stable Ambulatory Status: Ambulatory Discharge Destination: Home Transportation: Private Auto Accompanied By: self Schedule Follow-up Appointment: Yes Clinical Summary of Care: Electronic Signature(s) Signed: 01/23/2023 2:42:57 PM By: Jose [name] By: Jose [name] on 01/23/2023 09:39:02 Jose Dome ([id number]) [id number] Page 2 of 2 -------------------------------------------------------------------------------- Vitals Details Patient Name: Date of Service: Jose [name], [name] 01/23/2023 9:30 A M Medical Record Number: [id number] Patient Account Number: [id number] Date of Birth/Sex: Treating RN: [name] (72 y.o. Jose [name] Primary Care Jose [name]: Jose [name] Other Clinician: Daryll [name] Referring Jose [name]: Treating Jose [name]/Extender: Jose [name] in Treatment: 7 Vital Signs Time Taken: 09:45 Temperature (F): 98.2 Height (in): 66 Pulse (bpm): 66 Weight (lbs): 184.6 Respiratory Rate (breaths/min): 18 Body Mass Index (BMI): 29.8 Blood Pressure (mmHg): 167/73 Capillary Blood Glucose (mg/dl): 536 Reference Range: 80 - 120 mg / dl Electronic Signature(s) Signed: 01/23/2023 2:42:57 PM By: Jose [name] Entered By: Jose [name] on 01/23/2023 07:01:51

## 2023-01-24 ENCOUNTER — Encounter (HOSPITAL_BASED_OUTPATIENT_CLINIC_OR_DEPARTMENT_OTHER): Payer: Medicare Other | Admitting: General Surgery

## 2023-01-24 DIAGNOSIS — N3040 Irradiation cystitis without hematuria: Secondary | ICD-10-CM | POA: Diagnosis not present

## 2023-01-24 LAB — GLUCOSE, CAPILLARY
Glucose-Capillary: 175 mg/dL — ABNORMAL HIGH (ref 70–99)
Glucose-Capillary: 73 mg/dL (ref 70–99)

## 2023-01-24 NOTE — Progress Notes (Signed)
MARVIN, CUCINELLA D ([id number]) [id number] Page 1 of 2 Visit Report for 01/24/2023 HBO Details Patient Name: Date of Service: Jose [name], [name] 01/24/2023 9:30 A M Medical Record Number: [id number] Patient Account Number: [id number] Date of Birth/Sex: Treating RN: Jul 27, 1950 (72 y.o. [name] Primary Care Danny Zimny: [name] Other Clinician: Daryll Brod Referring Keigo Whalley: Treating Brason Berthelot/Extender: Delena Bali Weeks in Treatment: 7 HBO Treatment Course Details Treatment Course Number: 1 Ordering Nathanel Tallman: Baltazar Najjar T Treatments Ordered: otal 40 HBO Treatment Start Date: 12/17/2022 HBO Indication: Late Effect of Radiation HBO Treatment Details Treatment Number: 27 Patient Type: Outpatient Chamber Type: Monoplace Chamber Serial #: [id number] Treatment Protocol: 2.0 ATA with 90 minutes oxygen, and no air breaks Treatment Details Compression Rate Down: 2.0 psi / minute De-Compression Rate Up: 2.0 psi / minute Air breaks and breathing Decompress Decompress Compress Tx Pressure Begins Reached periods Begins Ends (leave unused spaces blank) Chamber Pressure (ATA 1 2 ------2 1 ) Clock Time (24 hr) 10:04 10:18 - - - - - - 11:48 11:56 Treatment Length: 112 (minutes) Treatment Segments: 4 Vital Signs Capillary Blood Glucose Reference Range: 80 - 120 mg / dl HBO Diabetic Blood Glucose Intervention Range: <131 mg/dl or >308 mg/dl Time Vitals Blood Respiratory Capillary Blood Glucose Pulse Action Type: Pulse: Temperature: Taken: Pressure: Rate: Glucose (mg/dl): Meter #: Oximetry (%) Taken: Pre 09:40 128/67 61 18 97.2 175 1 Post 11:56 183/81 73 18 97.2 73 1 juice given Treatment Response Treatment Toleration: Well Treatment Completion Status: Treatment Completed without Adverse Event Treatment Notes I certify that I directed and performed operation of said chamber for this treatment. MScammell Jose [name]  arrived with normal vital signs. He prepared for treatment. Patient's blood glucose level is well-controlled with his medication to the point that he has difficulty maintaining higher glucose level for HBOT long enough to achieve test levels for the glycemic intervention protocol. Per physician orders, patient only undergoes a blood glucose level test and as long as he is asymptomatic for hypoglycemia, patient is cleared for treatment. 4 oz of Apple Juice was sent in with patient for safety/intervention. After performing a safety check, he was placed in the chamber which was compressed with 100% oxygen at a rate of 2 psi/min after confirming normal ear equalization. He tolerated the treatment and subsequent decompression of the chamber at a rate of 2 psi/min. Post- treatment vital signs included BP of 183/81 mmHg. He denied symptoms related to hypertension, stating that he felt fine. He states that blood pressure at home is normal. He was stable upon discharge. Physician HBO Attestation: I certify that I supervised this HBO treatment in accordance with Medicare guidelines. A trained emergency response team is readily available per Yes hospital policies and procedures. Continue HBOT as ordered. [address] Jose [name], Jose [name] ([id number]) [id number] Page 2 of 2 Signed: 01/24/2023 4:47:55 PM By: Duanne Guess MD FACS Previous Signature: 01/24/2023 4:42:12 PM Version By: Haywood Pao CHT EMT BS , , Previous Signature: 01/24/2023 4:41:23 PM Version By: Haywood Pao CHT EMT BS , , Entered By: Duanne Guess on 01/24/2023 16:47:55 -------------------------------------------------------------------------------- HBO Safety Checklist Details Patient Name: Date of Service: Jose Sousa MES D. 01/24/2023 9:30 A M Medical Record Number: [id number] Patient Account Number: [id number] Date of Birth/Sex: Treating RN: 1950-08-10 (72 y.o. [name] Primary  Care Dadrian Ballantine: [name] Other Clinician: Daryll Brod Referring Nic Lampe: Treating Navina Wohlers/Extender: Delena Bali Weeks in Treatment: 7 HBO Safety Checklist Items Safety Checklist  Consent Form Signed Patient voided / foley secured and emptied 8:00 sausage egg cheese biscuit, grits, orange When did you last eato juice Last dose of injectable or oral agent 7:45 Ostomy pouch emptied and vented if applicable NA All implantable devices assessed, documented and approved NA Intravenous access site secured and place NA Valuables secured Linens and cotton and cotton/polyester blend (less than 51% polyester) Personal oil-based products / skin lotions / body lotions removed Wigs or hairpieces removed NA Smoking or tobacco materials removed NA Books / newspapers / magazines / loose paper removed Cologne, aftershave, perfume and deodorant removed Jewelry removed (may wrap wedding band) Make-up removed NA Hair care products removed Battery operated devices (external) removed NA Heating patches and chemical warmers removed NA Titanium eyewear removed NA Nail polish cured greater than 10 hours NA Casting material cured greater than 10 hours NA Hearing aids removed NA Loose dentures or partials removed NA Prosthetics have been removed NA Patient demonstrates correct use of air break device (if applicable) Patient concerns have been addressed Patient grounding bracelet on and cord attached to chamber Specifics for Inpatients (complete in addition to above) Medication sheet sent with patient NA Intravenous medications needed or due during therapy sent with patient NA Drainage tubes (e.g. nasogastric tube or chest tube secured and vented) NA Endotracheal or Tracheotomy tube secured NA Cuff deflated of air and inflated with saline NA Airway suctioned NA Notes Paper version used prior to treatment. Electronic Signature(s) Signed:  01/24/2023 1:11:57 PM By: Haywood Pao CHT EMT BS , , Entered By: Haywood Pao on 01/24/2023 13:11:57

## 2023-01-25 ENCOUNTER — Encounter (HOSPITAL_BASED_OUTPATIENT_CLINIC_OR_DEPARTMENT_OTHER): Payer: Medicare Other | Admitting: General Surgery

## 2023-01-25 DIAGNOSIS — N3040 Irradiation cystitis without hematuria: Secondary | ICD-10-CM | POA: Diagnosis not present

## 2023-01-25 LAB — GLUCOSE, CAPILLARY
Glucose-Capillary: 153 mg/dL — ABNORMAL HIGH (ref 70–99)
Glucose-Capillary: 90 mg/dL (ref 70–99)

## 2023-01-25 NOTE — Progress Notes (Signed)
TAJON, PRESTAGE ([id number]) [id number] Page 1 of 2 Visit Report for 01/25/2023 Problem List Details Patient Name: Date of Service: KIP, CALLWOOD MES D. 01/25/2023 7:30 A M Medical Record Number: [id number] Patient Account Number: [id number] Date of Birth/Sex: Treating RN: 1950-06-04 (72 y.o. 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 01/25/2023 Medical Record Number: [id number] Patient Account Number: [id number] Date of Birth/Sex: Treating RN: [birth year] (72 y.o. 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 R35.0 Frequency of micturition ULIS, STREETS ([id number]) [phone].pdf Page 2 of 2 R30.0 Dysuria C61 Malignant neoplasm of prostate Facility Procedures : CPT4 Code Description: 51884166 G0277-(Facility Use Only) HBOT full body chamber, , Modifier: Quantity: 4 Physician Procedures : CPT4 Code  Description Modifier [id number] - WC PHYS HYPERBARIC OXYGEN THERAPY ICD-10 Diagnosis Description 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

## 2023-01-25 NOTE — Progress Notes (Signed)
[name], [name] ([id number]) [id number] Page 1 of 1 Visit Report for 01/24/2023 SuperBill Details Patient Name: Date of Service: Jose [name], Jose [name] MES D. 01/24/2023 Medical Record Number: [id number] Patient Account Number: [id number] Date of Birth/Sex: Treating RN: June 19, 1950 (72 y.o. 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

## 2023-01-25 NOTE — Progress Notes (Signed)
SHAHAN, DEPTULA ([id number]) [id number] Page 1 of 2 Visit Report for 01/25/2023 Arrival Information Details Patient Name: Date of Service: Jose [name], Jose [name] Jose D. 01/25/2023 7:30 A M Medical Record Number: [id number] Patient Account Number: [id number] Date of Birth/Sex: Treating RN: 1950/04/29 (72 y.o. [name], [name] Primary Care Davari Lopes: [name] Other Clinician: Daryll Brod Referring Shmiel Morton: Treating Kambrie Eddleman/Extender: Delena Bali Weeks in Treatment: 7 Visit Information History Since Last Visit Added or deleted any medications: No Patient Arrived: Ambulatory Any new allergies or adverse reactions: No Arrival Time: 07:35 Had a fall or experienced change in No Accompanied By: self activities of daily living that may affect Transfer Assistance: None risk of falls: Patient Identification Verified: Yes Signs or symptoms of abuse/neglect since last visito No Secondary Verification Process Completed: Yes Hospitalized since last visit: No Patient Requires Transmission-Based Precautions: No Implantable device outside of the clinic excluding No Patient Has Alerts: No cellular tissue based products placed in the center since last visit: Pain Present Now: No Electronic Signature(s) Signed: 01/25/2023 12:13:00 PM By: Demetria Pore Entered By: Demetria Pore on 01/25/2023 10:30:52 -------------------------------------------------------------------------------- Encounter Discharge Information Details Patient Name: Date of Service: Jose Sousa Jose D. 01/25/2023 7:30 A M Medical Record Number: [id number] Patient Account Number: [id number] Date of Birth/Sex: Treating RN: 03/15/1950 (72 y.o. [name] Primary Care Prince Couey: [name] Other Clinician: Daryll Brod Referring Delane Stalling: Treating Ori Kreiter/Extender: Sigmund Hazel in Treatment: 7 Encounter Discharge Information Items Discharge  Condition: Stable Ambulatory Status: Ambulatory Discharge Destination: Home Transportation: Other Accompanied By: self Schedule Follow-up Appointment: No Clinical Summary of Care: Electronic Signature(s) Signed: 01/25/2023 12:13:00 PM By: Demetria Pore Entered By: Demetria Pore on 01/25/2023 10:37:49 [name] ([id number]) [id number] Page 2 of 2 -------------------------------------------------------------------------------- Vitals Details Patient Name: Date of Service: Jose [name], Jose Jose D. 01/25/2023 7:30 A M Medical Record Number: [id number] Patient Account Number: [id number] Date of Birth/Sex: Treating RN: [birth year] (72 y.o. [name] Primary Care Gerlad Pelzel: [name] Other Clinician: Daryll Brod Referring Kishon Garriga: Treating Catheryn Slifer/Extender: Delena Bali Weeks in Treatment: 7 Vital Signs Time Taken: 07:35 Temperature (F): 97.3 Height (in): 66 Pulse (bpm): 65 Weight (lbs): 184.6 Respiratory Rate (breaths/min): 18 Body Mass Index (BMI): 29.8 Blood Pressure (mmHg): 190/86 Capillary Blood Glucose (mg/dl): 244 Reference Range: 80 - 120 mg / dl Electronic Signature(s) Signed: 01/25/2023 12:13:00 PM By: Demetria Pore Entered By: Demetria Pore on 01/25/2023 10:31:28

## 2023-01-25 NOTE — Progress Notes (Signed)
BURR, CLEMENTZ [name] ([id number]) [id number] Page 1 of 2 Visit Report for 01/25/2023 HBO Details Patient Name: Date of Service: Jose [name], Jose [name]. 01/25/2023 7:30 A M Medical Record Number: [id number] Patient Account Number: [id number] Date of Birth/Sex: Treating RN: 06/24/1950 (72 y.o. Jose [name] Primary Care Jose [name]: [name] Other Clinician: Daryll Brod Referring Jose [name]: Treating Jose [name]/Extender: Delena Bali Weeks in Treatment: 7 HBO Treatment Course Details Treatment Course Number: 1 Ordering Loistine Eberlin: Baltazar Najjar T Treatments Ordered: otal 40 HBO Treatment Start Date: 12/17/2022 HBO Indication: Late Effect of Radiation HBO Treatment Details Treatment Number: 28 Patient Type: Outpatient Chamber Type: Monoplace Chamber Serial #: [id number] Treatment Protocol: 2.0 ATA with 90 minutes oxygen, and no air breaks Treatment Details Compression Rate Down: 2.0 psi / minute De-Compression Rate Up: 2.0 psi / minute Air breaks and breathing Decompress Decompress Compress Tx Pressure Begins Reached periods Begins Ends (leave unused spaces blank) Chamber Pressure (ATA 1 2 ------2 1 ) Clock Time (24 hr) 7:41 7:53 - - - - - - 9:24 9:31 Treatment Length: 110 (minutes) Treatment Segments: 4 Vital Signs Capillary Blood Glucose Reference Range: 80 - 120 mg / dl HBO Diabetic Blood Glucose Intervention Range: <131 mg/dl or >259 mg/dl Type: Time Vitals Blood Pulse: Respiratory Capillary Blood Glucose Pulse Action Temperature: Taken: Pressure: Rate: Glucose (mg/dl): Meter #: Oximetry (%) Taken: Pre 07:35 190/86 65 18 97.3 153 1 none per protocol Post 09:31 224/93 61 18 97.2 90 1 re-measure BP manually Post 09:47 172/94 manual BP. Treatment Response Treatment Toleration: Well Treatment Completion Status: Treatment Completed without Adverse Event Treatment Notes I certify that I directed and performed operation of  said chamber for this treatment. MScammell Mr. Reber arrived, prepared for treatment, and exhibited normal vital signs. After performing a safety check, he was placed in the chamber which was compressed with 100% oxygen at a rate of 2 psi/min. He tolerated the treatment and subsequent decompression of the chamber at the rate of 2psi/min. Post treatment BP was measured at 224/93 mmHg. He prepared for departure and then a manual BP was taken with result of 172/94 mmHg. He was stable upon discharge. Physician HBO Attestation: I certify that I supervised this HBO treatment in accordance with Medicare guidelines. A trained emergency response team is readily available per Yes hospital policies and procedures. Continue HBOT as ordered. Yes Electronic Signature(s) Signed: 01/25/2023 2:46:52 PM By: Haywood Pao CHT EMT BS , , Signed: 01/28/2023 8:43:29 AM By: Duanne Guess MD FACS Jose [name] ([id number]) [id number] Page 2 of 2 Signed: 01/28/2023 8:43:29 AM By: Duanne Guess MD FACS Previous Signature: 01/25/2023 12:46:46 PM Version By: Duanne Guess MD FACS Previous Signature: 01/25/2023 12:44:55 PM Version By: Duanne Guess MD FACS Previous Signature: 01/25/2023 12:13:00 PM Version By: Demetria Pore Entered By: Haywood Pao on 01/25/2023 11:46:51 -------------------------------------------------------------------------------- HBO Safety Checklist Details Patient Name: Date of Service: Jose [name]. 01/25/2023 7:30 A M Medical Record Number: [id number] Patient Account Number: [id number] Date of Birth/Sex: Treating RN: August 23, 1950 (72 y.o. Jose [name] Primary Care Bahja Bence: [name] Other Clinician: Daryll Brod Referring Jose [name]: Treating Jose [name]/Extender: Delena Bali Weeks in Treatment: 7 HBO Safety Checklist Items Safety Checklist Consent Form Signed Patient voided / foley secured and emptied When  did you last eato 6:15am sausage eggs and cheese croissant Last dose of injectable or oral agent 6:30 am glucerna Ostomy pouch emptied and vented if applicable NA All implantable devices assessed, documented and approved NA Intravenous  access site secured and place NA Valuables secured Linens and cotton and cotton/polyester blend (less than 51% polyester) Personal oil-based products / skin lotions / body lotions removed Wigs or hairpieces removed NA Smoking or tobacco materials removed NA Books / newspapers / magazines / loose paper removed Cologne, aftershave, perfume and deodorant removed Jewelry removed (may wrap wedding band) Make-up removed NA Hair care products removed Battery operated devices (external) removed NA Heating patches and chemical warmers removed NA Titanium eyewear removed NA Nail polish cured greater than 10 hours NA Casting material cured greater than 10 hours NA Hearing aids removed Loose dentures or partials removed NA Prosthetics have been removed NA Patient demonstrates correct use of air break device (if applicable) Patient concerns have been addressed Patient grounding bracelet on and cord attached to chamber Specifics for Inpatients (complete in addition to above) Medication sheet sent with patient NA Intravenous medications needed or due during therapy sent with patient NA Drainage tubes (e.g. nasogastric tube or chest tube secured and vented) NA Endotracheal or Tracheotomy tube secured NA Cuff deflated of air and inflated with saline NA Airway suctioned NA Electronic Signature(s) Signed: 01/25/2023 12:13:00 PM By: Demetria Pore Entered By: Demetria Pore on 01/25/2023 07:36:54

## 2023-01-25 NOTE — Progress Notes (Signed)
KHAEL, LINO D ([id number]) [id number] Page 1 of 2 Visit Report for 01/24/2023 Arrival Information Details Patient Name: Date of Service: DVONTA, CALTRIDER MES D. 01/24/2023 9:30 A M Medical Record Number: [id number] Patient Account Number: [id number] Date of Birth/Sex: Treating RN: 10-08-1950 (72 y.o. [name], [name] Primary Care Chau Savell: [name] Other Clinician: Daryll Brod Referring Cohen Doleman: Treating Annette Bertelson/Extender: Delena Bali Weeks in Treatment: 7 Visit Information History Since Last Visit Added or deleted any medications: No Patient Arrived: Ambulatory Any new allergies or adverse reactions: No Arrival Time: 09:40 Had a fall or experienced change in No Accompanied By: self activities of daily living that may affect Transfer Assistance: None risk of falls: Patient Identification Verified: Yes Signs or symptoms of abuse/neglect since last visito No Secondary Verification Process Completed: Yes Hospitalized since last visit: No Patient Requires Transmission-Based Precautions: No Implantable device outside of the clinic excluding No Patient Has Alerts: No cellular tissue based products placed in the center since last visit: Pain Present Now: No Electronic Signature(s) Signed: 01/24/2023 1:11:20 PM By: Haywood Pao CHT EMT BS , , Entered By: Haywood Pao on 01/24/2023 13:11:20 -------------------------------------------------------------------------------- Encounter Discharge Information Details Patient Name: Date of Service: [name] 01/24/2023 9:30 A M Medical Record Number: [id number] Patient Account Number: [id number] Date of Birth/Sex: Treating RN: 1950-06-25 (72 y.o. M) Primary Care Rivers Hamrick: [name] Other Clinician: Referring Jaheim Canino: Treating Salah Nakamura/Extender: Sigmund Hazel in Treatment: 7 Encounter Discharge Information Items Discharge  Condition: Stable Ambulatory Status: Ambulatory Discharge Destination: Home Transportation: Private Auto Accompanied By: self Schedule Follow-up Appointment: Yes Clinical Summary of Care: Electronic Signature(s) Signed: 01/25/2023 12:13:00 PM By: Francoise Ceo By: Demetria Pore on 01/24/2023 13:07:23 [name] ([id number]) [id number] Page 2 of 2 -------------------------------------------------------------------------------- Vitals Details Patient Name: Date of Service: CAELUM, NYGARD MES D. 01/24/2023 9:30 A M Medical Record Number: [id number] Patient Account Number: [id number] Date of Birth/Sex: Treating RN: [birth year] (72 y.o. [name] Primary Care Ahuva Poynor: [name] Other Clinician: Daryll Brod Referring Beadie Matsunaga: Treating Naeem Quillin/Extender: Delena Bali Weeks in Treatment: 7 Vital Signs Time Taken: 09:40 Temperature (F): 97.2 Height (in): 66 Pulse (bpm): 61 Weight (lbs): 184.6 Respiratory Rate (breaths/min): 18 Body Mass Index (BMI): 29.8 Blood Pressure (mmHg): 128/67 Capillary Blood Glucose (mg/dl): 433 Reference Range: 80 - 120 mg / dl Electronic Signature(s) Signed: 01/24/2023 1:11:30 PM By: Haywood Pao CHT EMT BS , , Entered By: Haywood Pao on 01/24/2023 13:11:29

## 2023-01-28 ENCOUNTER — Encounter (HOSPITAL_BASED_OUTPATIENT_CLINIC_OR_DEPARTMENT_OTHER): Payer: Medicare Other | Admitting: General Surgery

## 2023-01-28 DIAGNOSIS — N3040 Irradiation cystitis without hematuria: Secondary | ICD-10-CM | POA: Diagnosis not present

## 2023-01-28 LAB — GLUCOSE, CAPILLARY
Glucose-Capillary: 160 mg/dL — ABNORMAL HIGH (ref 70–99)
Glucose-Capillary: 116 mg/dL — ABNORMAL HIGH (ref 70–99)

## 2023-01-28 NOTE — Progress Notes (Addendum)
SYERE, GOLDNER D ([id number]) [id number] Page 1 of 2 Visit Report for 01/28/2023 HBO Details Patient Name: Date of Service: Jose [name], [name] 01/28/2023 9:30 A M Medical Record Number: [id number] Patient Account Number: [id number] Date of Birth/Sex: Treating RN: Apr 13, 1950 (72 y.o. [name], [name] Primary Care Lashanna Angelo: [name] Other Clinician: Haywood Pao Referring Rayshaun Needle: Treating Lokelani Lutes/Extender: Delena Bali Weeks in Treatment: 7 HBO Treatment Course Details Treatment Course Number: 1 Ordering Arvie Bartholomew: Baltazar Najjar T Treatments Ordered: otal 40 HBO Treatment Start Date: 12/17/2022 HBO Indication: Late Effect of Radiation HBO Treatment Details Treatment Number: 29 Patient Type: Outpatient Chamber Type: Monoplace Chamber Serial #: [id number] Treatment Protocol: 2.0 ATA with 90 minutes oxygen, and no air breaks Treatment Details Compression Rate Down: 1.0 psi / minute De-Compression Rate Up: 1.0 psi / minute Air breaks and breathing Decompress Decompress Compress Tx Pressure Begins Reached periods Begins Ends (leave unused spaces blank) Chamber Pressure (ATA 1 2 ------2 1 ) Clock Time (24 hr) 09:45 - - - - - - - 09:58 10:11 Treatment Length: 26 (minutes) Treatment Segments: 1 Vital Signs Capillary Blood Glucose Reference Range: 80 - 120 mg / dl HBO Diabetic Blood Glucose Intervention Range: <131 mg/dl or >440 mg/dl Type: Time Vitals Blood Respiratory Capillary Blood Glucose Pulse Action Pulse: Temperature: Taken: Pressure: Rate: Glucose (mg/dl): Meter #: Oximetry (%) Taken: Pre 09:38 198/94 73 18 97.5 160 take manual BP Pre 09:40 188/88 manual BP Post 10:14 201/87 66 18 96.3 116 informed physician Treatment Response Treatment Toleration: Well Treatment Completion Status: Treatment Aborted/Not Restarted Reason: Rheya [place] Choice Treatment Notes Mr. Wackerman arrived for treatment, BP was 198/94 mmHg.  He prepared for treatment. Patient stated that his ears were a little stuffy today and wanted to make sure he took Afrin. Patient self-administered Afrin. Manual BP taken at 188/88 mmHg. Informed Dr. Lady Gary. After performing a safety check, this tech asked patient if he wanted to wait a few minutes to allow Afrin to take effect as he stated that his ears were "stuffy" / "clogged." He opted to go ahead and try as his ears were just slightly stuffy. He was placed in the chamber which was compressed with 100% oxygen at approximately 0.74 psi/min by having rate set at 1 psi/min and ventilation rate at 450 L/min. Ear equalization status was checked at approximately 3 psig. This tech explained that if his ears do not clear up not to "tough through it" let [place] know so we can stop travel and avoid hurting ears. He verbalized understanding. At approximately 10 psig, patient stated that his ears had not "popped" once. The explanation of what he was experiencing was unclear, so I informed Dr. Lady Gary. Pressure reduced to 8 psig. Patient said that his ears were popping continually. Opted to remove patient to avoid harming tympanic membranes. Dr. Lady Gary examined his ears and recommended decongestants for next treatment. Patient's post-treatment vital signs included BP of 201/87 mmHg. He was stable upon discharge. Physician HBO Attestation: I certify that I supervised this HBO treatment in accordance with Medicare guidelines. A trained emergency response team is readily available per [facility] hospital policies and procedures. Continue HBOT as ordered. [address] RANDOL, BERMEL ([id number]) [id number] Page 2 of 2 Electronic Signature(s) Signed: 01/28/2023 1:24:50 PM By: Duanne Guess MD FACS Previous Signature: 01/28/2023 11:13:29 AM Version By: Haywood Pao CHT EMT BS , , Previous Signature: 01/28/2023 10:57:53 AM Version By: Haywood Pao CHT EMT BS , , Entered By: Duanne Guess on  01/28/2023 13:24:50 --------------------------------------------------------------------------------  HBO Safety Checklist Details Patient Name: Date of Service: Jose [name], Jose [name] MES D. 01/28/2023 9:30 A M Medical Record Number: [id number] Patient Account Number: [id number] Date of Birth/Sex: Treating RN: [birth year] (72 y.o. [name], [name] Primary Care Montario Zilka: [name] Other Clinician: Haywood Pao Referring Leotha Westermeyer: Treating Briawna Carver/Extender: Delena Bali Weeks in Treatment: 7 HBO Safety Checklist Items Safety Checklist Consent Form Signed Patient voided / foley secured and emptied When did you last eato 0700 Last dose of injectable or oral agent 0630 Ostomy pouch emptied and vented if applicable NA All implantable devices assessed, documented and approved NA Intravenous access site secured and place NA Valuables secured Linens and cotton and cotton/polyester blend (less than 51% polyester) Personal oil-based products / skin lotions / body lotions removed Wigs or hairpieces removed NA Smoking or tobacco materials removed NA Books / newspapers / magazines / loose paper removed Cologne, aftershave, perfume and deodorant removed Jewelry removed (may wrap wedding band) Make-up removed NA Hair care products removed Battery operated devices (external) removed Heating patches and chemical warmers removed Titanium eyewear removed Nail polish cured greater than 10 hours NA Casting material cured greater than 10 hours NA Hearing aids removed NA Loose dentures or partials removed NA Prosthetics have been removed NA Patient demonstrates correct use of air break device (if applicable) Patient concerns have been addressed Patient grounding bracelet on and cord attached to chamber Specifics for Inpatients (complete in addition to above) Medication sheet sent with patient NA Intravenous medications needed or due during therapy sent with  patient NA Drainage tubes (e.g. nasogastric tube or chest tube secured and vented) NA Endotracheal or Tracheotomy tube secured NA Cuff deflated of air and inflated with saline NA Airway suctioned NA Notes Paper version used prior to treatment start. Meal: 0630 Coffee, 0700 [employer] and Sausage pattie, buttered toast, OJ, 0815 Glucerna. Electronic Signature(s) Signed: 01/28/2023 10:56:04 AM By: Haywood Pao CHT EMT BS , , Entered By: Haywood Pao on 01/28/2023 10:56:04

## 2023-01-28 NOTE — Progress Notes (Signed)
CLIFFORD, WESTMORELAND ([id number]) [id number] Page 1 of 1 Visit Report for 01/28/2023 SuperBill Details Patient Name: Date of Service: Jose [name], Jose [name] MES D. 01/28/2023 Medical Record Number: [id number] Patient Account Number: [id number] Date of Birth/Sex: Treating RN: [birth year] (72 y.o. 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

## 2023-01-28 NOTE — Progress Notes (Addendum)
CASE, LAMMERS D ([id number]) [id number] Page 1 of 2 Visit Report for 01/28/2023 Arrival Information Details Patient Name: Date of Service: DANYAL, KENNON MES D. 01/28/2023 9:30 A M Medical Record Number: [id number] Patient Account Number: [id number] Date of Birth/Sex: Treating RN: [birth year] (72 y.o. [name], [name] Primary Care Mariyah Upshaw: [name] Other Clinician: Haywood Pao Referring Zyree Traynham: Treating Farida Mcreynolds/Extender: Delena Bali Weeks in Treatment: 7 Visit Information History Since Last Visit All ordered tests and consults were completed: Yes Patient Arrived: Ambulatory Added or deleted any medications: No Arrival Time: 09:20 Any new allergies or adverse reactions: No Accompanied By: self Had a fall or experienced change in No Transfer Assistance: None activities of daily living that may affect Patient Identification Verified: Yes risk of falls: Secondary Verification Process Completed: Yes Signs or symptoms of abuse/neglect since last visito No Patient Requires Transmission-Based Precautions: No Hospitalized since last visit: No Patient Has Alerts: No Implantable device outside of the clinic excluding No cellular tissue based products placed in the center since last visit: Pain Present Now: No Electronic Signature(s) Signed: 01/28/2023 10:53:18 AM By: Haywood Pao CHT EMT BS , , Entered By: Haywood Pao on 01/28/2023 10:53:18 -------------------------------------------------------------------------------- Encounter Discharge Information Details Patient Name: Date of Service: [name] 01/28/2023 9:30 A M Medical Record Number: [id number] Patient Account Number: [id number] Date of Birth/Sex: Treating RN: Apr 27, 1950 (72 y.o. [name] Primary Care Jaquae Rieves: [name] Other Clinician: Haywood Pao Referring Creedence Kunesh: Treating Genieve Ramaswamy/Extender: Sigmund Hazel in Treatment: 7 Encounter Discharge Information Items Discharge Condition: Stable Ambulatory Status: Ambulatory Discharge Destination: Home Transportation: Private Auto Accompanied By: self Schedule Follow-up Appointment: No Clinical Summary of Care: Electronic Signature(s) Signed: 01/28/2023 1:20:47 PM By: Haywood Pao CHT EMT BS , , Entered By: Haywood Pao on 01/28/2023 13:20:46 [name] ([id number]) [id number] Page 2 of 2 -------------------------------------------------------------------------------- Vitals Details Patient Name: Date of Service: AMMIE, BARRILLEAUX MES D. 01/28/2023 9:30 A M Medical Record Number: [id number] Patient Account Number: [id number] Date of Birth/Sex: Treating RN: 10/08/1950 (72 y.o. [name] Primary Care Lauris Serviss: [name] Other Clinician: Daryll Brod Referring Chosen Garron: Treating Sholom Dulude/Extender: Delena Bali Weeks in Treatment: 7 Vital Signs Time Taken: 09:38 Temperature (F): 97.5 Height (in): 66 Pulse (bpm): 73 Weight (lbs): 184.6 Respiratory Rate (breaths/min): 18 Body Mass Index (BMI): 29.8 Blood Pressure (mmHg): 198/94 Capillary Blood Glucose (mg/dl): 433 Reference Range: 80 - 120 mg / dl Electronic Signature(s) Signed: 01/28/2023 10:53:40 AM By: Haywood Pao CHT EMT BS , , Entered By: Haywood Pao on 01/28/2023 10:53:40

## 2023-01-29 ENCOUNTER — Encounter (HOSPITAL_BASED_OUTPATIENT_CLINIC_OR_DEPARTMENT_OTHER): Payer: Medicare Other | Admitting: General Surgery

## 2023-01-29 DIAGNOSIS — N3040 Irradiation cystitis without hematuria: Secondary | ICD-10-CM | POA: Diagnosis not present

## 2023-01-29 LAB — GLUCOSE, CAPILLARY: Glucose-Capillary: 150 mg/dL — ABNORMAL HIGH (ref 70–99)

## 2023-01-30 NOTE — Progress Notes (Signed)
[name], [name] ([id number]) [id number] Page 1 of 1 Visit Report for 01/29/2023 SuperBill Details Patient Name: Date of Service: Jose [name], Jose [name] MES D. 01/29/2023 Medical Record Number: [id number] Patient Account Number: [id number] Date of Birth/Sex: Treating RN: November 17, 1950 (72 y.o. 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 R35.0 Frequency of micturition R30.0 Dysuria C61 Malignant neoplasm of prostate Physician Procedures Quantity CPT4 Code Description Modifier [id number] - WC PHYS HYPERBARIC OXYGEN THERAPY 1 ICD-10 Diagnosis Description 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

## 2023-02-04 ENCOUNTER — Encounter (HOSPITAL_BASED_OUTPATIENT_CLINIC_OR_DEPARTMENT_OTHER): Payer: Medicare Other | Attending: Internal Medicine | Admitting: Internal Medicine

## 2023-02-04 DIAGNOSIS — N3040 Irradiation cystitis without hematuria: Secondary | ICD-10-CM | POA: Diagnosis present

## 2023-02-04 DIAGNOSIS — R350 Frequency of micturition: Secondary | ICD-10-CM | POA: Diagnosis not present

## 2023-02-04 DIAGNOSIS — Y842 Radiological procedure and radiotherapy as the cause of abnormal reaction of the patient, or of later complication, without mention of misadventure at the time of the procedure: Secondary | ICD-10-CM | POA: Insufficient documentation

## 2023-02-04 DIAGNOSIS — C61 Malignant neoplasm of prostate: Secondary | ICD-10-CM | POA: Insufficient documentation

## 2023-02-04 LAB — GLUCOSE, CAPILLARY
Glucose-Capillary: 92 mg/dL (ref 70–99)
Glucose-Capillary: 123 mg/dL — ABNORMAL HIGH (ref 70–99)
Glucose-Capillary: 134 mg/dL — ABNORMAL HIGH (ref 70–99)

## 2023-02-04 NOTE — Progress Notes (Addendum)
ARL, VANDORN ([id number]) [id number] Page 1 of 2 Visit Report for 02/04/2023 Arrival Information Details Patient Name: Date of Service: Jose [name], [name] 02/04/2023 9:30 A M Medical Record Number: [id number] Patient Account Number: [id number] Date of Birth/Sex: Treating RN: 09/22/1950 (72 y.o. [name], [name] Primary Care Larwence Tu: [name] Other Clinician: Haywood Pao Referring Gabrelle Roca: Treating Fadil Macmaster/Extender: [name] in Treatment: 8 Visit Information History Since Last Visit All ordered tests and consults were completed: Yes Patient Arrived: Ambulatory Added or deleted any medications: Yes Arrival Time: 09:15 Any new allergies or adverse reactions: No Accompanied By: self Had a fall or experienced change in No Transfer Assistance: None activities of daily living that may affect Patient Identification Verified: Yes risk of falls: Secondary Verification Process Completed: Yes Signs or symptoms of abuse/neglect since last visito No Patient Requires Transmission-Based Precautions: No Hospitalized since last visit: No Patient Has Alerts: No Implantable device outside of the clinic excluding No cellular tissue based products placed in the center since last visit: Pain Present Now: No Electronic Signature(s) Signed: 02/04/2023 1:32:13 PM By: Haywood Pao CHT EMT BS , , Previous Signature: 02/04/2023 10:32:12 AM Version By: Haywood Pao CHT EMT BS , , Entered By: Haywood Pao on 02/04/2023 13:32:13 -------------------------------------------------------------------------------- Encounter Discharge Information Details Patient Name: Date of Service: Jose Sousa MES D. 02/04/2023 9:30 A M Medical Record Number: [id number] Patient Account Number: [id number] Date of Birth/Sex: Treating RN: Sep 29, 1950 (72 y.o. Jose [name] Primary Care Hildreth Orsak: [name] Other Clinician: Daryll Brod Referring Daquisha Clermont: Treating Abrina Petz/Extender: [name] in Treatment: 8 Encounter Discharge Information Items Discharge Condition: Stable Ambulatory Status: Ambulatory Discharge Destination: Home Transportation: Private Auto Accompanied By: self Schedule Follow-up Appointment: No Clinical Summary of Care: Electronic Signature(s) Signed: 02/04/2023 1:31:36 PM By: Haywood Pao CHT EMT BS , , Entered By: Haywood Pao on 02/04/2023 13:31:35 [name] ([id number]) [id number] Page 2 of 2 -------------------------------------------------------------------------------- Vitals Details Patient Name: Date of Service: Jose [name], [name] 02/04/2023 9:30 A M Medical Record Number: [id number] Patient Account Number: [id number] Date of Birth/Sex: Treating RN: 1950/04/08 (72 y.o. Jose [name] Primary Care Darenda Fike: [name] Other Clinician: Daryll Brod Referring Vaneta Hammontree: Treating Rydan Gulyas/Extender: [name] in Treatment: 8 Vital Signs Time Taken: 09:15 Temperature (F): 97.9 Height (in): 66 Pulse (bpm): 71 Weight (lbs): 184.6 Respiratory Rate (breaths/min): 18 Body Mass Index (BMI): 29.8 Blood Pressure (mmHg): 171/87 Capillary Blood Glucose (mg/dl): 301 Reference Range: 80 - 120 mg / dl Electronic Signature(s) Signed: 02/04/2023 10:32:55 AM By: Haywood Pao CHT EMT BS , , Entered By: Haywood Pao on 02/04/2023 10:32:55

## 2023-02-04 NOTE — Progress Notes (Signed)
Jose, HENDREN [name] ([id number]) [id number] Page 1 of 3 Visit Report for 02/04/2023 HBO Details Patient Name: Date of Service: Jose [name], Jose MES [name]. 02/04/2023 9:30 A M Medical Record Number: [id number] Patient Account Number: [id number] Date of Birth/Sex: Treating RN: [birth year] (72 y.o. Jose [name] Primary Care Keeghan Bialy: [name] Other Clinician: Daryll Brod Referring Cyle Kenyon: Treating Lyndel Dancel/Extender: [name] in Treatment: 8 HBO Treatment Course Details Treatment Course Number: 1 Ordering Traniyah Hallett: Linard Millers Treatments Ordered: otal 40 HBO Treatment Start Date: 12/17/2022 HBO Indication: Late Effect of Radiation HBO Treatment Details Treatment Number: 31 Patient Type: Outpatient Chamber Type: Monoplace Chamber Serial #: [id number] Treatment Protocol: Other (Must Document Treatment Details in Notes) Treatment Details Compression Rate Down: 1.0 psi / minute De-Compression Rate Up: 1.5 psi / minute Air breaks and breathing Decompress Decompress Compress Tx Pressure Begins Reached periods Begins Ends (leave unused spaces blank) Chamber Pressure (ATA 1 - ------- 1 ) Clock Time (24 hr) 09:44 10:01 - - - - - - 11:31 11:43 Treatment Length: 119 (minutes) Treatment Segments: 4 Vital Signs Capillary Blood Glucose Reference Range: 80 - 120 mg / dl HBO Diabetic Blood Glucose Intervention Range: <131 mg/dl or >716 mg/dl Type: Time Vitals Blood Respiratory Capillary Blood Glucose Pulse Action Pulse: Temperature: Taken: Pressure: Rate: Glucose (mg/dl): Meter #: Oximetry (%) Taken: Pre 09:15 171/87 71 18 97.9 123 none per protocol Post 11:46 198/83 49 18 98.2 re-measured BP. Post 11:57 180/82 58 Treatment Response Treatment Toleration: Well Treatment Completion Status: Treatment Completed without Adverse Event Treatment Notes I certify that I directed and/or performed operation of said chamber for  this treatment. [place] Mr. Szeto arrived with normal vital signs. Blood glucose level was 123 mg/dL. Patient returning from change in status. Patient explained that on last Wednesday (01/28/2023), he experienced "clogged ears." He went to Urgent care who saw fluid behind the tympanic eardrum on both sides. He was prescribed prednisone and Flonase. He has finished prednisone prescription today. He prepared for treatment. Patient's blood glucose level is well-controlled with his medication to the point that he has difficulty maintaining higher glucose level for HBOT long enough to achieve test levels for the glycemic intervention protocol. Per physician orders, patient only undergoes a blood glucose level test and as long as he is asymptomatic for hypoglycemia, patient is cleared for treatment. 4 oz of Orange Juice was sent in with patient for safety/intervention. After performing a safety check, he was placed in the chamber which was compressed with 100% oxygen at a rate of 1.5 psi/min after confirming normal ear equalization. He tolerated the treatment and subsequent decompression at the rate of 1.5 psi/min. He denied any issues with ear equalization and/or pain caused by barotrauma. Post-treatment BP was 198/83 mmHg, heart rate 49 bpm. Patient prepared for departure. Secondary BP and HeartRate was 180/82 mmHg and 58 bpm respectively. Fairly borderline with protocol requirements. Patient denied symptoms of bradycardia. He was stable upon discharge. Shanee Batch Notes Tolerated HBO well. He apparently felt that he had fluid in his ears and was seen in urgent care he was prescribed prednisone and Flonase. He tolerated treatment well but I did not get a chance to see his ears DYLLEN, BRYNER ([id number]) [id number] Page 2 of 3 Physician HBO Attestation: I certify that I supervised this HBO treatment in accordance with Medicare guidelines. A trained emergency response team is readily  available per Yes hospital policies and procedures. Continue HBOT as ordered. Yes Electronic Signature(s) Signed: 02/04/2023 5:24:09  PM By: Baltazar Najjar MD Previous Signature: 02/04/2023 1:52:03 PM Version By: Haywood Pao CHT EMT BS , , Previous Signature: 02/04/2023 1:44:47 PM Version By: Haywood Pao CHT EMT BS , , Previous Signature: 02/04/2023 1:31:00 PM Version By: Haywood Pao CHT EMT BS , , Previous Signature: 02/04/2023 11:07:31 AM Version By: Haywood Pao CHT EMT BS , , Previous Signature: 02/04/2023 11:06:50 AM Version By: Haywood Pao CHT EMT BS , , Entered By: Baltazar Najjar on 02/04/2023 17:20:33 -------------------------------------------------------------------------------- HBO Safety Checklist Details Patient Name: Date of Service: Jose [name] MES [name]. 02/04/2023 9:30 A M Medical Record Number: [id number] Patient Account Number: [id number] Date of Birth/Sex: Treating RN: 02-17-1951 (72 y.o. Jose [name] Primary Care Campbell Kray: [name] Other Clinician: Daryll Brod Referring Karsten Vaughn: Treating Seher Schlagel/Extender: [name] in Treatment: 8 HBO Safety Checklist Items Safety Checklist Consent Form Signed Patient voided / foley secured and emptied When did you last eato 0700-OJ, egg, sausage cheese croissant Last dose of injectable or oral agent 0740 Ostomy pouch emptied and vented if applicable NA All implantable devices assessed, documented and approved NA Intravenous access site secured and place NA Valuables secured Linens and cotton and cotton/polyester blend (less than 51% polyester) Personal oil-based products / skin lotions / body lotions removed NA Wigs or hairpieces removed NA Smoking or tobacco materials removed Books / newspapers / magazines / loose paper removed Cologne, aftershave, perfume and deodorant removed Jewelry removed (may wrap wedding band) Make-up removed NA Hair  care products removed Battery operated devices (external) removed Heating patches and chemical warmers removed Titanium eyewear removed Nail polish cured greater than 10 hours NA Casting material cured greater than 10 hours NA Hearing aids removed NA Loose dentures or partials removed NA Prosthetics have been removed NA Patient demonstrates correct use of air break device (if applicable) Patient concerns have been addressed Patient grounding bracelet on and cord attached to chamber Specifics for Inpatients (complete in addition to above) Medication sheet sent with patient NA Intravenous medications needed or due during therapy sent with patient NA Drainage tubes (e.g. nasogastric tube or chest tube secured and vented) NA Endotracheal or Tracheotomy tube secured NA Cuff deflated of air and inflated with saline NA Airway suctioned NA SAIED, BOLDT [name] ([id number]) [id number] Page 3 of 3 Notes Paper version used prior to treatment start. Electronic Signature(s) Signed: 02/04/2023 10:39:37 AM By: Haywood Pao CHT EMT BS , , Entered By: Haywood Pao on 02/04/2023 10:39:36

## 2023-02-05 ENCOUNTER — Encounter (HOSPITAL_BASED_OUTPATIENT_CLINIC_OR_DEPARTMENT_OTHER): Payer: Medicare Other | Admitting: Internal Medicine

## 2023-02-05 DIAGNOSIS — N3040 Irradiation cystitis without hematuria: Secondary | ICD-10-CM | POA: Diagnosis not present

## 2023-02-05 LAB — GLUCOSE, CAPILLARY
Glucose-Capillary: 106 mg/dL — ABNORMAL HIGH (ref 70–99)
Glucose-Capillary: 75 mg/dL (ref 70–99)

## 2023-02-05 NOTE — Progress Notes (Signed)
TAISTO, HSIAO ([id number]) [id number] Page 1 of 1 Visit Report for 02/04/2023 SuperBill Details Patient Name: Date of Service: FLOYDE, SHAKE MES D. 02/04/2023 Medical Record Number: [id number] Patient Account Number: [id number] Date of Birth/Sex: Treating RN: 03/27/1950 (72 y.o. 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 R35.0 Frequency of micturition R30.0 Dysuria C61 Malignant neoplasm of prostate Physician Procedures Quantity CPT4 Code Description Modifier [id number] - WC PHYS HYPERBARIC OXYGEN THERAPY 1 ICD-10 Diagnosis Description 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

## 2023-02-06 ENCOUNTER — Encounter (HOSPITAL_BASED_OUTPATIENT_CLINIC_OR_DEPARTMENT_OTHER): Payer: Medicare Other | Admitting: Internal Medicine

## 2023-02-06 DIAGNOSIS — N3040 Irradiation cystitis without hematuria: Secondary | ICD-10-CM | POA: Diagnosis not present

## 2023-02-06 LAB — GLUCOSE, CAPILLARY
Glucose-Capillary: 95 mg/dL (ref 70–99)
Glucose-Capillary: 93 mg/dL (ref 70–99)

## 2023-02-06 NOTE — Progress Notes (Addendum)
KWAMEL, REGEN [name] ([id number]) [id number] Page 1 of 2 Visit Report for 02/05/2023 HBO Details Patient Name: Date of Service: Jose [name], Jose [name]. 02/05/2023 9:30 A M Medical Record Number: [id number] Patient Account Number: [id number] Date of Birth/Sex: Treating RN: Apr 24, 1950 (72 y.o. Jose [name] Primary Care Shanitra Phillippi: [name] Other Clinician: Haywood Pao Referring Sahasra Belue: Treating Ange Puskas/Extender: [name] in Treatment: 9 HBO Treatment Course Details Treatment Course Number: 1 Ordering Ryle Buscemi: Linard Millers Treatments Ordered: otal 40 HBO Treatment Start Date: 12/17/2022 HBO Indication: Late Effect of Radiation HBO Treatment Details Treatment Number: 32 Patient Type: Outpatient Chamber Type: Monoplace Chamber Serial #: [id number] Treatment Protocol: Other (Must Document Treatment Details in Notes) Treatment Details Compression Rate Down: 1.5 psi / minute De-Compression Rate Up: 1.5 psi / minute Air breaks and breathing Decompress Decompress Compress Tx Pressure Begins Reached periods Begins Ends (leave unused spaces blank) Chamber Pressure (ATA 1 - ------- 1 ) Clock Time (24 hr) 10:06 10:22 - - - - - - 11:52 12:03 Treatment Length: 117 (minutes) Treatment Segments: 4 Vital Signs Capillary Blood Glucose Reference Range: 80 - 120 mg / dl HBO Diabetic Blood Glucose Intervention Range: <131 mg/dl or >272 mg/dl Time Vitals Blood Respiratory Capillary Blood Glucose Pulse Action Type: Pulse: Temperature: Taken: Pressure: Rate: Glucose (mg/dl): Meter #: Oximetry (%) Taken: Pre 09:40 144/65 62 18 98.6 106 see note Post 12:03 175/76 55 18 98.7 75 see note Treatment Response Treatment Toleration: Well Treatment Completion Status: Treatment Completed without Adverse Event Treatment Notes Jose [name] arrived with normal vital signs. Blood glucose level was 106 mg/dL. He prepared for treatment.  Patient's blood glucose level is well-controlled with his medication to the point that he has difficulty maintaining higher glucose level for HBOT long enough to achieve test levels for the glycemic intervention protocol. Per physician orders, patient only undergoes a blood glucose level test and as long as he is asymptomatic for hypoglycemia, patient is cleared for treatment. 8 oz of appleJuice was sent in with patient for safety/intervention. After performing a safety check, he was placed in the chamber which was compressed with 100% oxygen at a rate of 1.5 psi/min after confirming normal ear equalization. He tolerated the treatment and subsequent decompression at the rate of 1.5 psi/min. He denied any issues with ear equalization and/or pain caused by barotrauma. Post-treatment blood glucose was 75 mg/dL and heart rate was 55 bpm. He drank the 8 oz Apple Juice that was taken in for intervention. He denied symptoms of bradycardia. Patient prepared for departure. Jose [name] was stable upon discharge. Mirenda Jose Notes no concerns with rx given Physician HBO Attestation: I certify that I supervised this HBO treatment in accordance with Medicare guidelines. A trained emergency response team is readily available per Yes hospital policies and procedures. Continue HBOT as ordered. [address] ANIELLO, SERVI ([id number]) [id number] Page 2 of 2 Electronic Signature(s) Signed: 02/10/2023 9:47:40 AM By: Jose Najjar MD Previous Signature: 02/06/2023 3:26:45 PM Version By: Haywood Pao CHT EMT BS , , Entered By: Jose [name] on 02/10/2023 09:41:45 -------------------------------------------------------------------------------- HBO Safety Checklist Details Patient Name: Date of Service: Jose [name]. 02/05/2023 9:30 A M Medical Record Number: [id number] Patient Account Number: [id number] Date of Birth/Sex: Treating RN: Mar 28, 1950 (72 y.o. Jose [name] Primary Care Marquese Burkland:  [name] Other Clinician: Haywood Pao Referring Lourdes Manning: Treating Izack Hoogland/Extender: [name] in Treatment: 9 HBO Safety Checklist Items Safety Checklist Consent Form Signed Patient voided /  foley secured and emptied When did you last eato 0715/0820 Last dose of injectable or oral agent 0740 Ostomy pouch emptied and vented if applicable NA All implantable devices assessed, documented and approved NA Intravenous access site secured and place NA Valuables secured Linens and cotton and cotton/polyester blend (less than 51% polyester) Personal oil-based products / skin lotions / body lotions removed Wigs or hairpieces removed NA Smoking or tobacco materials removed NA Books / newspapers / magazines / loose paper removed Cologne, aftershave, perfume and deodorant removed Jewelry removed (may wrap wedding band) Make-up removed NA Hair care products removed Battery operated devices (external) removed Heating patches and chemical warmers removed Titanium eyewear removed Nail polish cured greater than 10 hours NA Casting material cured greater than 10 hours NA Hearing aids removed NA Loose dentures or partials removed NA Prosthetics have been removed NA Patient demonstrates correct use of air break device (if applicable) Patient concerns have been addressed Patient grounding bracelet on and cord attached to chamber Specifics for Inpatients (complete in addition to above) Medication sheet sent with patient NA Intravenous medications needed or due during therapy sent with patient NA Drainage tubes (e.g. nasogastric tube or chest tube secured and vented) NA Endotracheal or Tracheotomy tube secured NA Cuff deflated of air and inflated with saline NA Airway suctioned NA Notes Paper version used prior to treatment start. Electronic Signature(s) Signed: 02/06/2023 3:16:45 PM By: Haywood Pao CHT EMT BS , , Entered  By: Haywood Pao on 02/06/2023 15:16:44

## 2023-02-06 NOTE — Progress Notes (Signed)
DEONTAY, DEIGNAN ([id number]) [id number] Page 1 of 2 Visit Report for 02/05/2023 Arrival Information Details Patient Name: Date of Service: Jose [name], Jose [name] Jose D. 02/05/2023 9:30 A M Medical Record Number: [id number] Patient Account Number: [id number] Date of Birth/Sex: Treating RN: 1950-08-30 (72 y.o. [name], [name] Primary Care Talyssa Gibas: [name] Other Clinician: Haywood Pao Referring Sam Overbeck: Treating Zacariah Belue/Extender: [name] in Treatment: 9 Visit Information History Since Last Visit All ordered tests and consults were completed: Yes Patient Arrived: Ambulatory Added or deleted any medications: No Arrival Time: 09:10 Any new allergies or adverse reactions: No Accompanied By: self Had a fall or experienced change in No Transfer Assistance: None activities of daily living that may affect Patient Identification Verified: Yes risk of falls: Secondary Verification Process Completed: Yes Signs or symptoms of abuse/neglect since last visito No Patient Requires Transmission-Based Precautions: No Hospitalized since last visit: No Patient Has Alerts: No Implantable device outside of the clinic excluding No cellular tissue based products placed in the center since last visit: Pain Present Now: No Electronic Signature(s) Signed: 02/06/2023 3:14:07 PM By: Haywood Pao CHT EMT BS , , Entered By: Haywood Pao on 02/06/2023 15:14:06 -------------------------------------------------------------------------------- Encounter Discharge Information Details Patient Name: Date of Service: Jose Sousa Jose D. 02/05/2023 9:30 A M Medical Record Number: [id number] Patient Account Number: [id number] Date of Birth/Sex: Treating RN: [birth year] (72 y.o. [name] Primary Care Aiyanah Kalama: [name] Other Clinician: Haywood Pao Referring Clebert Wenger: Treating Sharea Guinther/Extender: [name] in Treatment: 9 Encounter Discharge Information Items Discharge Condition: Stable Ambulatory Status: Ambulatory Discharge Destination: Home Transportation: Private Auto Accompanied By: self Schedule Follow-up Appointment: No Clinical Summary of Care: Electronic Signature(s) Signed: 02/06/2023 3:29:35 PM By: Haywood Pao CHT EMT BS , , Entered By: Haywood Pao on 02/06/2023 15:29:35 [name] ([id number]) [id number] Page 2 of 2 -------------------------------------------------------------------------------- Vitals Details Patient Name: Date of Service: Jose [name], Jose Jose D. 02/05/2023 9:30 A M Medical Record Number: [id number] Patient Account Number: [id number] Date of Birth/Sex: Treating RN: [birth year] (72 y.o. [name], [name] Primary Care Edelyn Heidel: [name] Other Clinician: Haywood Pao Referring Jadda Hunsucker: Treating Bettylou Frew/Extender: [name] in Treatment: 9 Vital Signs Time Taken: 09:40 Temperature (F): 98.6 Height (in): 66 Pulse (bpm): 106 Weight (lbs): 184.6 Respiratory Rate (breaths/min): 18 Body Mass Index (BMI): 29.8 Blood Pressure (mmHg): 144/65 Capillary Blood Glucose (mg/dl): 244 Reference Range: 80 - 120 mg / dl Electronic Signature(s) Signed: 02/06/2023 3:14:40 PM By: Haywood Pao CHT EMT BS , , Entered By: Haywood Pao on 02/06/2023 15:14:39

## 2023-02-07 ENCOUNTER — Encounter (HOSPITAL_BASED_OUTPATIENT_CLINIC_OR_DEPARTMENT_OTHER): Payer: Medicare Other | Admitting: Internal Medicine

## 2023-02-07 DIAGNOSIS — N3040 Irradiation cystitis without hematuria: Secondary | ICD-10-CM | POA: Diagnosis not present

## 2023-02-07 LAB — GLUCOSE, CAPILLARY
Glucose-Capillary: 108 mg/dL — ABNORMAL HIGH (ref 70–99)
Glucose-Capillary: 100 mg/dL — ABNORMAL HIGH (ref 70–99)

## 2023-02-07 NOTE — Progress Notes (Signed)
JOBE, CRISTE ([id number]) [id number] Page 1 of 2 Visit Report for 02/06/2023 Arrival Information Details Patient Name: Date of Service: Jose [name], [name] 02/06/2023 9:30 A M Medical Record Number: [id number] Patient Account Number: [id number] Date of Birth/Sex: Treating RN: Dec 21, 1950 (72 y.o. [name], [name] Primary Care Geraldina Parrott: [name] Other Clinician: Haywood Pao Referring Tali Coster: Treating Keita Demarco/Extender: [name] in Treatment: 9 Visit Information History Since Last Visit All ordered tests and consults were completed: Yes Patient Arrived: Ambulatory Added or deleted any medications: No Arrival Time: 09:19 Any new allergies or adverse reactions: No Accompanied By: self Had a fall or experienced change in No Transfer Assistance: None activities of daily living that may affect Patient Identification Verified: Yes risk of falls: Secondary Verification Process Completed: Yes Signs or symptoms of abuse/neglect since last visito No Patient Requires Transmission-Based Precautions: No Hospitalized since last visit: No Patient Has Alerts: No Implantable device outside of the clinic excluding No cellular tissue based products placed in the center since last visit: Pain Present Now: No Electronic Signature(s) Signed: 02/06/2023 4:55:02 PM By: Haywood Pao CHT EMT BS , , Entered By: Haywood Pao on 02/06/2023 16:55:02 -------------------------------------------------------------------------------- Encounter Discharge Information Details Patient Name: Date of Service: Jose [name] MES D. 02/06/2023 9:30 A M Medical Record Number: [id number] Patient Account Number: [id number] Date of Birth/Sex: Treating RN: 12-10-1950 (72 y.o. [name] Primary Care Kynesha Guerin: [name] Other Clinician: Haywood Pao Referring Naylene Foell: Treating Dariel Pellecchia/Extender: [name] in Treatment: 9 Encounter Discharge Information Items Discharge Condition: Stable Ambulatory Status: Ambulatory Discharge Destination: Home Transportation: Private Auto Accompanied By: self Schedule Follow-up Appointment: No Clinical Summary of Care: Electronic Signature(s) Signed: 02/06/2023 5:01:07 PM By: Haywood Pao CHT EMT BS , , Entered By: Haywood Pao on 02/06/2023 17:01:07 Jose [name] ([id number]) [id number] Page 2 of 2 -------------------------------------------------------------------------------- Vitals Details Patient Name: Date of Service: Jose [name], Jose [name] MES D. 02/06/2023 9:30 A M Medical Record Number: [id number] Patient Account Number: [id number] Date of Birth/Sex: Treating RN: [birth year] (72 y.o. [name] Primary Care Alizza Sacra: [name] Other Clinician: Haywood Pao Referring Sheila Gervasi: Treating Bereket Gernert/Extender: [name] in Treatment: 9 Vital Signs Time Taken: 09:24 Temperature (F): 98.6 Height (in): 66 Pulse (bpm): 70 Weight (lbs): 184.6 Respiratory Rate (breaths/min): 18 Body Mass Index (BMI): 29.8 Blood Pressure (mmHg): 170/80 Capillary Blood Glucose (mg/dl): 95 Reference Range: 80 - 120 mg / dl Electronic Signature(s) Signed: 02/06/2023 4:55:33 PM By: Haywood Pao CHT EMT BS , , Entered By: Haywood Pao on 02/06/2023 16:55:32

## 2023-02-07 NOTE — Progress Notes (Addendum)
Jose, PENNELLA [name] ([id number]) [id number] Page 1 of 2 Visit Report for 02/06/2023 HBO Details Patient Name: Date of Service: Jose [name], Jose MES [name]. 02/06/2023 9:30 A M Medical Record Number: [id number] Patient Account Number: [id number] Date of Birth/Sex: Treating RN: 1950-09-18 (72 y.o. [name] Primary Care Velencia Lenart: [name] Other Clinician: Haywood Pao Referring Iva Montelongo: Treating Isatou Agredano/Extender: [name] in Treatment: 9 HBO Treatment Course Details Treatment Course Number: 1 Ordering Shaelynn Dragos: Linard Millers Treatments Ordered: otal 40 HBO Treatment Start Date: 12/17/2022 HBO Indication: Late Effect of Radiation HBO Treatment Details Treatment Number: 33 Patient Type: Outpatient Chamber Type: Monoplace Chamber Serial #: [id number] Treatment Protocol: Other (Must Document Treatment Details in Notes) Treatment Details Compression Rate Down: 1.5 psi / minute De-Compression Rate Up: 1.5 psi / minute Air breaks and breathing Decompress Decompress Compress Tx Pressure Begins Reached periods Begins Ends (leave unused spaces blank) Chamber Pressure (ATA 1 - ------- 1 ) Clock Time (24 hr) 09:51 10:08 - - - - - - 11:38 11:49 Treatment Length: 118 (minutes) Treatment Segments: 4 Vital Signs Capillary Blood Glucose Reference Range: 80 - 120 mg / dl HBO Diabetic Blood Glucose Intervention Range: <131 mg/dl or >308 mg/dl Type: Time Vitals Blood Respiratory Capillary Blood Glucose Pulse Action Pulse: Temperature: Taken: Pressure: Rate: Glucose (mg/dl): Meter #: Oximetry (%) Taken: Pre 09:24 170/80 70 18 98.6 95 none per protocol Post 11:52 203/83 61 18 98.4 93 re-measured BP. Post 12:05 179/83 Treatment Response Treatment Toleration: Well Treatment Completion Status: Treatment Completed without Adverse Event Treatment Notes Mr. Greenia arrived with normal vital signs. Blood glucose level was 95  mg/dL. He prepared for treatment. Patient's blood glucose level is well-controlled with his medication to the point that he has difficulty maintaining higher glucose level for HBOT long enough to achieve test levels for the glycemic intervention protocol. Per physician orders, patient only undergoes a blood glucose level test and as long as he is asymptomatic for hypoglycemia, patient is cleared for treatment. 8 oz of orange Juice was sent in with patient for safety/intervention. After performing a safety check, he was placed in the chamber which was compressed with 100% oxygen at a rate of 1.5 psi/min after confirming normal ear equalization. He tolerated the treatment and subsequent decompression at the rate of 1.5 psi/min. He denied any issues with ear equalization and/or pain caused by barotrauma. Post-treatment blood glucose was 93 mg/dL. He drank the 8 oz Orange Juice that was taken in for intervention. Patient prepared for departure. Mr. Hurney was stable upon discharge. Jose [name] Notes no concerns with rx given Physician HBO Attestation: I certify that I supervised this HBO treatment in accordance with Medicare guidelines. A trained emergency response team is readily available per Yes hospital policies and procedures. Continue HBOT as ordered. [address] WESTEN, CANCELLIERI ([id number]) [id number] Page 2 of 2 Electronic Signature(s) Signed: 02/07/2023 4:45:28 AM By: Baltazar Najjar MD Previous Signature: 02/06/2023 5:00:18 PM Version By: Haywood Pao CHT EMT BS , , Entered By: Baltazar Najjar on 02/06/2023 17:52:53 -------------------------------------------------------------------------------- HBO Safety Checklist Details Patient Name: Date of Service: Jose [name] MES [name]. 02/06/2023 9:30 A M Medical Record Number: [id number] Patient Account Number: [id number] Date of Birth/Sex: Treating RN: [birth year] (72 y.o. [name] Primary Care Bulah Lurie: [name] Other  Clinician: Haywood Pao Referring Everett Ricciardelli: Treating Jacy Brocker/Extender: [name] in Treatment: 9 HBO Safety Checklist Items Safety Checklist Consent Form Signed Patient voided / foley secured and emptied 0730 -  fried egg, bacon, toast with strawberry When did you last eato jam, oj Last dose of injectable or oral agent 0730 Ostomy pouch emptied and vented if applicable NA All implantable devices assessed, documented and approved NA Intravenous access site secured and place NA Valuables secured Linens and cotton and cotton/polyester blend (less than 51% polyester) Personal oil-based products / skin lotions / body lotions removed Wigs or hairpieces removed NA Smoking or tobacco materials removed NA Books / newspapers / magazines / loose paper removed Cologne, aftershave, perfume and deodorant removed Jewelry removed (may wrap wedding band) Make-up removed NA Hair care products removed Battery operated devices (external) removed Heating patches and chemical warmers removed Titanium eyewear removed Nail polish cured greater than 10 hours NA Casting material cured greater than 10 hours NA Hearing aids removed NA Loose dentures or partials removed NA Prosthetics have been removed NA Patient demonstrates correct use of air break device (if applicable) Patient concerns have been addressed Patient grounding bracelet on and cord attached to chamber Specifics for Inpatients (complete in addition to above) Medication sheet sent with patient NA Intravenous medications needed or due during therapy sent with patient NA Drainage tubes (e.g. nasogastric tube or chest tube secured and vented) NA Endotracheal or Tracheotomy tube secured NA Cuff deflated of air and inflated with saline NA Airway suctioned NA Notes Paper version used prior to treatment start. Electronic Signature(s) Signed: 02/06/2023 4:56:47 PM By: Haywood Pao CHT  EMT BS , , Entered By: Haywood Pao on 02/06/2023 16:56:47

## 2023-02-07 NOTE — Progress Notes (Signed)
COLEY, SHEELEY ([id number]) [id number] Page 1 of 1 Visit Report for 02/06/2023 SuperBill Details Patient Name: Date of Service: Jose [name], Jose [name]. 02/06/2023 Medical Record Number: [id number] Patient Account Number: [id number] Date of Birth/Sex: Treating RN: 1950/06/10 (72 y.o. 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

## 2023-02-08 NOTE — Progress Notes (Signed)
RAMONE, BLUMENSTOCK ([id number]) [id number] Page 1 of 2 Visit Report for 01/29/2023 Arrival Information Details Patient Name: Date of Service: Jose [name], [name] 01/29/2023 9:30 A M Medical Record Number: [id number] Patient Account Number: [id number] Date of Birth/Sex: Treating RN: [birth year] (72 y.o. [name], [name] Primary Care Infant Zink: [name] Other Clinician: Daryll Brod Referring Tarnesha Ulloa: Treating Tinaya Ceballos/Extender: Delena Bali Weeks in Treatment: 8 Visit Information History Since Last Visit Added or deleted any medications: No Patient Arrived: Ambulatory Any new allergies or adverse reactions: No Arrival Time: 09:39 Had a fall or experienced change in No Accompanied By: self activities of daily living that may affect Transfer Assistance: None risk of falls: Patient Identification Verified: Yes Signs or symptoms of abuse/neglect since last visito No Secondary Verification Process Completed: Yes Hospitalized since last visit: No Patient Requires Transmission-Based Precautions: No Implantable device outside of the clinic excluding No Patient Has Alerts: No cellular tissue based products placed in the center since last visit: Pain Present Now: No Electronic Signature(s) Signed: 02/07/2023 3:18:21 PM By: Demetria Pore Entered By: Demetria Pore on 01/29/2023 09:48:42 -------------------------------------------------------------------------------- Encounter Discharge Information Details Patient Name: Date of Service: Jose Sousa MES D. 01/29/2023 9:30 A M Medical Record Number: [id number] Patient Account Number: [id number] Date of Birth/Sex: Treating RN: [birth year] (72 y.o. [name] Primary Care Essa Wenk: [name] Other Clinician: Daryll Brod Referring Arlin Sass: Treating Yaakov Saindon/Extender: Sigmund Hazel in Treatment: 8 Encounter Discharge Information  Items Discharge Condition: Stable Ambulatory Status: Ambulatory Discharge Destination: Home Transportation: Private Auto Accompanied By: self Schedule Follow-up Appointment: Yes Clinical Summary of Care: Electronic Signature(s) Signed: 02/07/2023 3:18:21 PM By: Francoise Ceo By: Demetria Pore on 01/29/2023 09:55:20 [name] ([id number]) [id number] Page 2 of 2 -------------------------------------------------------------------------------- Vitals Details Patient Name: Date of Service: Jose [name], [name] 01/29/2023 9:30 A M Medical Record Number: [id number] Patient Account Number: [id number] Date of Birth/Sex: Treating RN: [birth year] (72 y.o. [name] Primary Care Rio Taber: [name] Other Clinician: Daryll Brod Referring Magdaline Zollars: Treating Shelton Square/Extender: Delena Bali Weeks in Treatment: 8 Vital Signs Time Taken: 09:39 Temperature (F): 97.0 Height (in): 66 Pulse (bpm): 79 Weight (lbs): 184.6 Respiratory Rate (breaths/min): 18 Body Mass Index (BMI): 29.8 Blood Pressure (mmHg): 194/98 Capillary Blood Glucose (mg/dl): 151 Reference Range: 80 - 120 mg / dl Electronic Signature(s) Signed: 02/07/2023 3:18:21 PM By: Demetria Pore Entered By: Demetria Pore on 01/29/2023 09:48:47

## 2023-02-08 NOTE — Progress Notes (Signed)
Jose [name], Jose [name] ([id number]) [id number] Page 1 of 2 Visit Report for 01/29/2023 HBO Details Patient Name: Date of Service: HALLARD, MCCULLAR MES [name]. 01/29/2023 9:30 A M Medical Record Number: [id number] Patient Account Number: [id number] Date of Birth/Sex: Treating RN: 28-Jan-1951 (72 y.o. Jose [name] Primary Care Elliot Meldrum: [name] Other Clinician: Daryll Brod Referring Fermin Yan: Treating Larri Brewton/Extender: Sigmund Hazel in Treatment: 8 HBO Treatment Course Details Treatment Course Number: 1 Ordering Conny Situ: Jose [name] Treatments Ordered: otal 40 HBO Treatment Start Date: 12/17/2022 HBO Indication: Late Effect of Radiation HBO Treatment Details Treatment Number: 30 Patient Type: Outpatient Chamber Type: Monoplace Chamber Serial #: [id number] Treatment Protocol: Other (Must Document Treatment Details in Notes) Treatment Details Compression Rate Down: 2.0 psi / minute De-Compression Rate Up: 1.0 psi / minute A breaks and breathing ir Compress Tx Pressure periods Decompress Decompress Begins Reached (leave unused spaces Begins Ends blank) Chamber Pressure (ATA 1 2 2 1 1 2  --2 1 ) Clock Time (24 hr) 9:53 10:13 10:43 10:49 10:56 11:17 - - 12:16 12:30 Treatment Length: 157 (minutes) Treatment Segments: 5 Vital Signs Capillary Blood Glucose Reference Range: 80 - 120 mg / dl HBO Diabetic Blood Glucose Intervention Range: <131 mg/dl or >027 mg/dl Type: Time Vitals Blood Pulse: Respiratory Temperature: Capillary Blood Glucose Pulse Action Taken: Pressure: Rate: Glucose (mg/dl): Meter #: Oximetry (%) Taken: Pre 09:39 194/98 79 18 97 150 1 orange juice 4 oz sent for intervention Post 12:32 209/98 68 18 98 92 1 manual BP taken Post 12:50 172/100 borderline. Treatment Response Treatment Toleration: Well Treatment Completion Status: Treatment Completed without Adverse Event Treatment Notes Patient had to use  restroom. Once finished we restarted KBonham I certify that I directed and performed operation of said chamber for this treatment. M[place]ell Mr. Sera arrived with normal vital signs except BP of 194/98 mmHg. He prepared for treatment. Patient's blood glucose level is well-controlled with his medication to the point that he has difficulty maintaining higher glucose level for HBOT long enough to achieve test levels for the glycemic intervention protocol. Per physician orders, patient only undergoes a blood glucose level test and as long as he is asymptomatic for hypoglycemia, patient is cleared for treatment. 4 oz of Apple Juice was sent in with patient for safety/intervention. After performing a safety check, he was placed in the chamber which was compressed with 100% oxygen at a rate of 2 psi/min after confirming normal ear equalization. He tolerated the treatment and subsequent decompression of the chamber at a rate of 2 psi/min. Post-treatment vital signs included BP of 209/98 mmHg. He denied symptoms related to hypertension, stating that he felt fine. He states that blood pressure at home is normal. Secondary manual BP was 172/100 mmHg. He was stable upon discharge. MSCAMMELL Physician HBO Attestation: I certify that I supervised this HBO treatment in accordance with Medicare guidelines. A trained emergency response team is readily available per Yes hospital policies and procedures. Continue HBOT as ordered. Yes Electronic Signature(s) Signed: 01/29/2023 5:35:37 PM By: Duanne Guess MD FACS [name] ([id number]) [id number] Page 2 of 2 Signed: 01/29/2023 5:35:37 PM By: Duanne Guess MD FACS Previous Signature: 01/29/2023 1:54:11 PM Version By: Haywood Pao CHT EMT BS , , Entered By: Duanne Guess on 01/29/2023 14:35:37 -------------------------------------------------------------------------------- HBO Safety Checklist Details Patient Name: Date of  Service: Jose [name] MES [name]. 01/29/2023 9:30 A M Medical Record Number: [id number] Patient Account Number: [id number] Date of Birth/Sex: Treating RN: Dec 11, 1950 (72 y.o. Jose [name],  Linda Primary Care Syair Fricker: [name] Other Clinician: Daryll Brod Referring Jose [name]: Treating Jose [name]/Extender: Jose [name] Weeks in Treatment: 8 HBO Safety Checklist Items Safety Checklist Consent Form Signed Patient voided / foley secured and emptied When did you last eato 6:50 sausage eggs cheese croissant Last dose of injectable or oral agent 8:00 glucerna Ostomy pouch emptied and vented if applicable NA All implantable devices assessed, documented and approved NA Intravenous access site secured and place NA Valuables secured Linens and cotton and cotton/polyester blend (less than 51% polyester) Personal oil-based products / skin lotions / body lotions removed Wigs or hairpieces removed Smoking or tobacco materials removed NA Books / newspapers / magazines / loose paper removed Cologne, aftershave, perfume and deodorant removed Jewelry removed (may wrap wedding band) Make-up removed NA Hair care products removed Battery operated devices (external) removed NA Heating patches and chemical warmers removed NA Titanium eyewear removed NA Nail polish cured greater than 10 hours NA Casting material cured greater than 10 hours NA Hearing aids removed NA Loose dentures or partials removed NA Prosthetics have been removed NA Patient demonstrates correct use of air break device (if applicable) Patient concerns have been addressed Patient grounding bracelet on and cord attached to chamber Specifics for Inpatients (complete in addition to above) Medication sheet sent with patient NA Intravenous medications needed or due during therapy sent with patient NA Drainage tubes (e.g. nasogastric tube or chest tube secured and vented) NA Endotracheal or  Tracheotomy tube secured Cuff deflated of air and inflated with saline NA Airway suctioned NA Notes Paper version used prior to treatment start. Electronic Signature(s) Signed: 02/07/2023 3:18:21 PM By: Demetria Pore Entered By: Demetria Pore on 01/29/2023 09:48:50

## 2023-02-10 NOTE — Progress Notes (Signed)
RONIN, FARB ([id number]) [id number] Page 1 of 1 Visit Report for 02/05/2023 SuperBill Details Patient Name: Date of Service: Jose [name], Jose [name]. 02/05/2023 Medical Record Number: [id number] Patient Account Number: [id number] Date of Birth/Sex: Treating RN: [birth year] (72 y.o. 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

## 2023-02-11 ENCOUNTER — Encounter (HOSPITAL_BASED_OUTPATIENT_CLINIC_OR_DEPARTMENT_OTHER): Payer: Medicare Other | Admitting: Internal Medicine

## 2023-02-11 DIAGNOSIS — N3040 Irradiation cystitis without hematuria: Secondary | ICD-10-CM | POA: Diagnosis not present

## 2023-02-11 LAB — GLUCOSE, CAPILLARY: Glucose-Capillary: 130 mg/dL — ABNORMAL HIGH (ref 70–99)

## 2023-02-11 NOTE — Progress Notes (Signed)
BREYSON, FIEBELKORN ([id number]) [id number] Page 1 of 1 Visit Report for 02/11/2023 SuperBill Details Patient Name: Date of Service: Jose [name], Jose [name]. 02/11/2023 Medical Record Number: [id number] Patient Account Number: [id number] Date of Birth/Sex: Treating RN: 09-30-1950 (72 y.o. 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

## 2023-02-11 NOTE — Progress Notes (Addendum)
Jose [name], Jose [name] ([id number]) [id number] Page 1 of 3 Visit Report for 02/11/2023 HBO Details Patient Name: Date of Service: Jose [name], Jose [name]. 02/11/2023 9:30 A M Medical Record Number: [id number] Patient Account Number: [id number] Date of Birth/Sex: Treating RN: 1950/03/28 (72 y.o. [name] Primary Care Kayleigh Broadwell: [name] Other Clinician: Haywood Pao Referring Alize Borrayo: Treating Aianna Fahs/Extender: [name] in Treatment: 9 HBO Treatment Course Details Treatment Course Number: 1 Ordering Henleigh Robello: Baltazar Najjar T Treatments Ordered: otal 40 HBO Treatment Start Date: 12/17/2022 HBO Indication: Late Effect of Radiation HBO Treatment Details Treatment Number: 35 Patient Type: Outpatient Chamber Type: Monoplace Chamber Serial #: [id number] Treatment Protocol: 2.0 ATA with 90 minutes oxygen, and no air breaks Treatment Details Compression Rate Down: 1.5 psi / minute De-Compression Rate Up: 2.0 psi / minute Air breaks and breathing Decompress Decompress Compress Tx Pressure Begins Reached periods Begins Ends (leave unused spaces blank) Chamber Pressure (ATA 1 2 ------2 1 ) Clock Time (24 hr) 10:07 10:21 - - - - - - 11:51 12:02 Treatment Length: 115 (minutes) Treatment Segments: 4 Vital Signs Capillary Blood Glucose Reference Range: 80 - 120 mg / dl HBO Diabetic Blood Glucose Intervention Range: <131 mg/dl or >841 mg/dl Type: Time Vitals Blood Pulse: Respiratory Temperature: Capillary Blood Glucose Pulse Action Taken: Pressure: Rate: Glucose (mg/dl): Meter #: Oximetry (%) Taken: Pre 09:45 128/96 71 18 98.1 130 none per protocol Post 12:04 217/102 68 16 97.7 91 none per orders/ Remeasure BP Post 12:10 202/92 informed physician Treatment Response Treatment Toleration: Well Treatment Completion Status: Treatment Completed without Adverse Event Treatment Notes Jose [name] arrived with normal vital  signs. Blood glucose level was 130 mg/dL. He prepared for treatment. Patient's blood glucose level is well-controlled with his medication to the point that he has difficulty maintaining higher glucose level for HBOT long enough to achieve test levels for the glycemic intervention protocol. Per physician orders, patient only undergoes a blood glucose level test and as long as he is asymptomatic for hypoglycemia, patient is cleared for treatment. 4 oz of orange Juice was sent in with patient for safety/intervention. After performing a safety check, he was placed in the chamber which was compressed with 100% oxygen at a rate of 1.5 psi/min after confirming normal ear equalization. He tolerated the treatment and subsequent decompression at the rate of 1.5 psi/min. He denied any issues with ear equalization and/or pain caused by barotrauma. Post-treatment blood glucose was 91 mg/dL. He denied symptoms related to hypoglycemia. He drank the 4 oz Orange Juice that was taken in for intervention. Blood pressure was higher than protocol range. 217/102 mmHg. Patient discussed reasons for higher BP. Secondary BP was 202/92 mmHg. Patient prepared for departure. Jose [name] was stable upon discharge. Jhordan Mckibben Notes No concerns with treatment given Physician HBO Attestation: I certify that I supervised this HBO treatment in accordance with Medicare guidelines. A trained emergency response team is readily available per Yes hospital policies and procedures. EMANUELE, HOGWOOD [name] ([id number]) [id number] Page 2 of 3 Continue HBOT as ordered. Yes Electronic Signature(s) Signed: 02/11/2023 4:43:54 PM By: Baltazar Najjar MD Previous Signature: 02/11/2023 2:53:03 PM Version By: Haywood Pao CHT EMT BS , , Previous Signature: 02/11/2023 1:58:13 PM Version By: Haywood Pao CHT EMT BS , , Entered By: Baltazar Najjar on 02/11/2023  16:40:26 -------------------------------------------------------------------------------- HBO Safety Checklist Details Patient Name: Date of Service: Jose Sousa MES [name]. 02/11/2023 9:30 A M Medical Record Number: [id number] Patient Account Number: [id number] Date of Birth/Sex: Treating  RN: Aug 11, 1950 (72 y.o. [name] Primary Care Sayler Mickiewicz: [name] Other Clinician: Haywood Pao Referring Nikolette Reindl: Treating Liandra Mendia/Extender: [name] in Treatment: 9 HBO Safety Checklist Items Safety Checklist Consent Form Signed Patient voided / foley secured and emptied When did you last eato 0800 Last dose of injectable or oral agent 0745 Ostomy pouch emptied and vented if applicable NA All implantable devices assessed, documented and approved NA Intravenous access site secured and place NA Valuables secured Linens and cotton and cotton/polyester blend (less than 51% polyester) Personal oil-based products / skin lotions / body lotions removed Wigs or hairpieces removed NA Smoking or tobacco materials removed NA Books / newspapers / magazines / loose paper removed Cologne, aftershave, perfume and deodorant removed Jewelry removed (may wrap wedding band) Make-up removed NA Hair care products removed Battery operated devices (external) removed Heating patches and chemical warmers removed Titanium eyewear removed Nail polish cured greater than 10 hours NA Casting material cured greater than 10 hours NA Hearing aids removed NA Loose dentures or partials removed NA Prosthetics have been removed NA Patient demonstrates correct use of air break device (if applicable) Patient concerns have been addressed Patient grounding bracelet on and cord attached to chamber Specifics for Inpatients (complete in addition to above) Medication sheet sent with patient NA Intravenous medications needed or due during therapy sent with  patient NA Drainage tubes (e.g. nasogastric tube or chest tube secured and vented) NA Endotracheal or Tracheotomy tube secured NA Cuff deflated of air and inflated with saline NA Airway suctioned NA Notes Paper version used prior to treatment start. Electronic Signature(s) Signed: 02/11/2023 1:52:42 PM By: Haywood Pao CHT EMT BS , , Entered By: Haywood Pao on 02/11/2023 13:52:41 Jose [name] ([id number]) [id number] Page 3 of 3

## 2023-02-11 NOTE — Progress Notes (Addendum)
DEZJUAN, COPUS ([id number]) [id number] Page 1 of 2 Visit Report for 02/11/2023 Arrival Information Details Patient Name: Date of Service: CARDIN, MALSOM MES D. 02/11/2023 9:30 A M Medical Record Number: [id number] Patient Account Number: [id number] Date of Birth/Sex: Treating RN: 1951/01/29 (72 y.o. [name], [name] Primary Care Nyeshia Mysliwiec: [name] Other Clinician: Haywood Pao Referring Jendaya Gossett: Treating Macyn Remmert/Extender: [name] in Treatment: 9 Visit Information History Since Last Visit All ordered tests and consults were completed: Yes Patient Arrived: Ambulatory Added or deleted any medications: No Arrival Time: 09:45 Any new allergies or adverse reactions: No Accompanied By: self Had a fall or experienced change in No Transfer Assistance: None activities of daily living that may affect Patient Identification Verified: Yes risk of falls: Secondary Verification Process Completed: Yes Signs or symptoms of abuse/neglect since last visito No Patient Requires Transmission-Based Precautions: No Hospitalized since last visit: No Patient Has Alerts: No Implantable device outside of the clinic excluding No cellular tissue based products placed in the center since last visit: Pain Present Now: No Electronic Signature(s) Signed: 02/11/2023 1:50:39 PM By: Haywood Pao CHT EMT BS , , Entered By: Haywood Pao on 02/11/2023 10:50:39 -------------------------------------------------------------------------------- Encounter Discharge Information Details Patient Name: Date of Service: [name] 02/11/2023 9:30 A M Medical Record Number: [id number] Patient Account Number: [id number] Date of Birth/Sex: Treating RN: 1950/12/19 (72 y.o. [name] Primary Care Myriam Brandhorst: [name] Other Clinician: Haywood Pao Referring Kelly Eisler: Treating Dailin Sosnowski/Extender: [name] in Treatment: 9 Encounter Discharge Information Items Discharge Condition: Stable Ambulatory Status: Ambulatory Discharge Destination: Home Transportation: Private Auto Accompanied By: self Schedule Follow-up Appointment: No Clinical Summary of Care: Electronic Signature(s) Signed: 02/11/2023 2:54:18 PM By: Haywood Pao CHT EMT BS , , Entered By: Haywood Pao on 02/11/2023 11:54:17 [name] ([id number]) [id number] Page 2 of 2 -------------------------------------------------------------------------------- Vitals Details Patient Name: Date of Service: FARID, BERGUM MES D. 02/11/2023 9:30 A M Medical Record Number: [id number] Patient Account Number: [id number] Date of Birth/Sex: Treating RN: Jul 15, 1950 (72 y.o. [name] Primary Care Vanilla Heatherington: [name] Other Clinician: Haywood Pao Referring Jadwiga Faidley: Treating Kaytelyn Glore/Extender: [name] in Treatment: 9 Vital Signs Time Taken: 09:45 Temperature (F): 98.1 Height (in): 66 Pulse (bpm): 71 Weight (lbs): 184.6 Respiratory Rate (breaths/min): 18 Body Mass Index (BMI): 29.8 Blood Pressure (mmHg): 128/96 Capillary Blood Glucose (mg/dl): 387 Reference Range: 80 - 120 mg / dl Electronic Signature(s) Signed: 02/11/2023 1:51:07 PM By: Haywood Pao CHT EMT BS , , Entered By: Haywood Pao on 02/11/2023 10:51:07

## 2023-02-12 ENCOUNTER — Encounter (HOSPITAL_BASED_OUTPATIENT_CLINIC_OR_DEPARTMENT_OTHER): Payer: Medicare Other | Admitting: Internal Medicine

## 2023-02-12 DIAGNOSIS — N3040 Irradiation cystitis without hematuria: Secondary | ICD-10-CM | POA: Diagnosis not present

## 2023-02-12 LAB — GLUCOSE, CAPILLARY
Glucose-Capillary: 91 mg/dL (ref 70–99)
Glucose-Capillary: 158 mg/dL — ABNORMAL HIGH (ref 70–99)
Glucose-Capillary: 94 mg/dL (ref 70–99)

## 2023-02-12 NOTE — Progress Notes (Signed)
Jose, [name] ([id number]) [id number] Page 1 of 2 Visit Report for 02/12/2023 HBO Details Patient Name: Date of Service: Jose [name], Jose [name] MES D. 02/12/2023 9:30 A M Medical Record Number: [id number] Patient Account Number: [id number] Date of Birth/Sex: Treating RN: 03/18/1950 (72 y.o. [name], [name] Primary Care Dhamar Gregory: [name] Other Clinician: Haywood Pao Referring Sidney Silberman: Treating Arizona Sorn/Extender: [name] in Treatment: 10 HBO Treatment Course Details Treatment Course Number: 1 Ordering Ivyanna Sibert: Baltazar Najjar T Treatments Ordered: otal 40 HBO Treatment Start Date: 12/17/2022 HBO Indication: Late Effect of Radiation HBO Treatment Details Treatment Number: 36 Patient Type: Outpatient Chamber Type: Monoplace Chamber Serial #: [id number] Treatment Protocol: 2.0 ATA with 90 minutes oxygen, and no air breaks Treatment Details Compression Rate Down: 1.5 psi / minute De-Compression Rate Up: 2.0 psi / minute Air breaks and breathing Decompress Decompress Compress Tx Pressure Begins Reached periods Begins Ends (leave unused spaces blank) Chamber Pressure (ATA 1 2 ------2 1 ) Clock Time (24 hr) 09:48 10:01 - - - - - - 11:31 11:39 Treatment Length: 111 (minutes) Treatment Segments: 4 Vital Signs Capillary Blood Glucose Reference Range: 80 - 120 mg / dl HBO Diabetic Blood Glucose Intervention Range: <131 mg/dl or >962 mg/dl Type: Time Vitals Blood Respiratory Capillary Blood Glucose Pulse Action Pulse: Temperature: Taken: Pressure: Rate: Glucose (mg/dl): Meter #: Oximetry (%) Taken: Pre 09:32 143/86 80 12 97.6 158 none per protocol Post 11:43 184/83 67 18 98.4 97 none per orders. Treatment Response Treatment Toleration: Well Treatment Completion Status: Treatment Completed without Adverse Event Treatment Notes Mr. Melhorn arrived with normal vital signs. Blood glucose level was 158 mg/dL. He prepared  for treatment. Patient's blood glucose level is well-controlled with his medication to the point that he has difficulty maintaining higher glucose level for HBOT long enough to achieve test levels for the glycemic intervention protocol. Per physician orders, patient only undergoes a blood glucose level test and as long as he is asymptomatic for hypoglycemia, patient is cleared for treatment. 4 oz of Apple Juice was sent in with patient for safety/intervention. After performing a safety check, he was placed in the chamber which was compressed with 100% oxygen at a rate of 1.5 psi/min after confirming normal ear equalization. He tolerated the treatment and subsequent decompression at the rate of 1.5 psi/min. He denied any issues with ear equalization and/or pain caused by barotrauma. Post-treatment blood glucose was 91 mg/dL. He denied symptoms related to hypoglycemia. He drank the 4 oz Apple Juice that was taken in for intervention after exiting the chamber. Blood pressure was higher than protocol range. 184/83 mmHg. Patient discussed reasons for higher BP. Patient prepared for departure. Mr. Hood was stable upon discharge. [name] Notes No concerns with treatment given Physician HBO Attestation: I certify that I supervised this HBO treatment in accordance with Medicare guidelines. A trained emergency response team is readily available per Yes hospital policies and procedures. Continue HBOT as ordered. [address] SAHEIM, GEARY ([id number]) [id number] Page 2 of 2 Electronic Signature(s) Signed: 02/13/2023 10:03:28 AM By: Baltazar Najjar MD Previous Signature: 02/12/2023 12:34:56 PM Version By: Haywood Pao CHT EMT BS , , Entered By: Baltazar Najjar on 02/12/2023 13:26:41 -------------------------------------------------------------------------------- HBO Safety Checklist Details Patient Name: Date of Service: [name] 02/12/2023 9:30 A M Medical Record Number:  [id number] Patient Account Number: [id number] Date of Birth/Sex: Treating RN: 03-Mar-1951 (72 y.o. [name] Primary Care Artemisia Auvil: [name] Other Clinician: Haywood Pao Referring Crista Nuon: Treating Floretta Petro/Extender: Leanord Hawking  Lauretta Chester , Estela Weeks in Treatment: 10 HBO Safety Checklist Items Safety Checklist Consent Form Signed Patient voided / foley secured and emptied When did you last eato 0800 Last dose of injectable or oral agent 0730 Ostomy pouch emptied and vented if applicable NA All implantable devices assessed, documented and approved NA Intravenous access site secured and place NA Valuables secured Linens and cotton and cotton/polyester blend (less than 51% polyester) Personal oil-based products / skin lotions / body lotions removed Wigs or hairpieces removed NA Smoking or tobacco materials removed NA Books / newspapers / magazines / loose paper removed Cologne, aftershave, perfume and deodorant removed Jewelry removed (may wrap wedding band) Make-up removed NA Hair care products removed Battery operated devices (external) removed Heating patches and chemical warmers removed Titanium eyewear removed Nail polish cured greater than 10 hours NA Casting material cured greater than 10 hours NA Hearing aids removed NA Loose dentures or partials removed NA Prosthetics have been removed NA Patient demonstrates correct use of air break device (if applicable) Patient concerns have been addressed Patient grounding bracelet on and cord attached to chamber Specifics for Inpatients (complete in addition to above) Medication sheet sent with patient NA Intravenous medications needed or due during therapy sent with patient NA Drainage tubes (e.g. nasogastric tube or chest tube secured and vented) NA Endotracheal or Tracheotomy tube secured NA Cuff deflated of air and inflated with saline NA Airway suctioned NA Notes Paper version  used prior to treatment start. Electronic Signature(s) Signed: 02/12/2023 12:32:04 PM By: Haywood Pao CHT EMT BS , , Entered By: Haywood Pao on 02/12/2023 09:32:04

## 2023-02-12 NOTE — Progress Notes (Addendum)
DEMOSTHENES, HULME ([id number]) [id number] Page 1 of 2 Visit Report for 02/12/2023 Arrival Information Details Patient Name: Date of Service: Jose [name], [name] 02/12/2023 9:30 A M Medical Record Number: [id number] Patient Account Number: [id number] Date of Birth/Sex: Treating RN: [birth year] (72 y.o. [name], [name] Primary Care Kimberlin Scheel: [name] Other Clinician: Haywood Pao Referring Darcell Sabino: Treating Lounette Sloan/Extender: [name] in Treatment: 10 Visit Information History Since Last Visit All ordered tests and consults were completed: Yes Patient Arrived: Ambulatory Added or deleted any medications: No Arrival Time: 09:21 Any new allergies or adverse reactions: No Accompanied By: self Had a fall or experienced change in No Transfer Assistance: None activities of daily living that may affect Patient Identification Verified: Yes risk of falls: Secondary Verification Process Completed: Yes Signs or symptoms of abuse/neglect since last visito No Patient Requires Transmission-Based Precautions: No Hospitalized since last visit: No Patient Has Alerts: No Implantable device outside of the clinic excluding No cellular tissue based products placed in the center since last visit: Pain Present Now: No Electronic Signature(s) Signed: 02/12/2023 11:29:39 AM By: Haywood Pao CHT EMT BS , , Entered By: Haywood Pao on 02/12/2023 11:29:38 -------------------------------------------------------------------------------- Encounter Discharge Information Details Patient Name: Date of Service: Jose Sousa MES D. 02/12/2023 9:30 A M Medical Record Number: [id number] Patient Account Number: [id number] Date of Birth/Sex: Treating RN: 11/12/1950 (72 y.o. [name] Primary Care Perry Molla: [name] Other Clinician: Haywood Pao Referring Chandon Lazcano: Treating Aviya Jarvie/Extender: [name] in Treatment: 10 Encounter Discharge Information Items Discharge Condition: Stable Ambulatory Status: Ambulatory Discharge Destination: Home Transportation: Private Auto Accompanied By: self Schedule Follow-up Appointment: No Clinical Summary of Care: Electronic Signature(s) Signed: 02/12/2023 12:35:31 PM By: Haywood Pao CHT EMT BS , , Entered By: Haywood Pao on 02/12/2023 12:35:31 Jose [name] ([id number]) [id number] Page 2 of 2 -------------------------------------------------------------------------------- Vitals Details Patient Name: Date of Service: Jose [name], [name] 02/12/2023 9:30 A M Medical Record Number: [id number] Patient Account Number: [id number] Date of Birth/Sex: Treating RN: 1950/05/14 (72 y.o. [name], [name] Primary Care Tymesha Ditmore: [name] Other Clinician: Haywood Pao Referring Zoii Florer: Treating Nautika Cressey/Extender: [name] in Treatment: 10 Vital Signs Time Taken: 09:32 Temperature (F): 97.6 Height (in): 66 Pulse (bpm): 80 Weight (lbs): 184.6 Respiratory Rate (breaths/min): 12 Body Mass Index (BMI): 29.8 Blood Pressure (mmHg): 143/86 Capillary Blood Glucose (mg/dl): 416 Reference Range: 80 - 120 mg / dl Electronic Signature(s) Signed: 02/12/2023 12:30:59 PM By: Haywood Pao CHT EMT BS , , Entered By: Haywood Pao on 02/12/2023 12:30:59

## 2023-02-13 ENCOUNTER — Encounter (HOSPITAL_BASED_OUTPATIENT_CLINIC_OR_DEPARTMENT_OTHER): Payer: Medicare Other | Admitting: Internal Medicine

## 2023-02-13 DIAGNOSIS — N3040 Irradiation cystitis without hematuria: Secondary | ICD-10-CM | POA: Diagnosis not present

## 2023-02-13 LAB — GLUCOSE, CAPILLARY
Glucose-Capillary: 158 mg/dL — ABNORMAL HIGH (ref 70–99)
Glucose-Capillary: 107 mg/dL — ABNORMAL HIGH (ref 70–99)

## 2023-02-13 NOTE — Progress Notes (Signed)
ZAKERY, DAFFIN ([id number]) [id number] Page 1 of 2 Visit Report for 02/13/2023 Arrival Information Details Patient Name: Date of Service: NELS, SHADDIX MES D. 02/13/2023 9:30 A M Medical Record Number: [id number] Patient Account Number: [id number] Date of Birth/Sex: Treating RN: Jul 31, 1950 (72 y.o. [name] Primary Care Harlean Regula: [name] Other Clinician: Haywood Pao Referring Jaedyn Lard: Treating Issaac Shipper/Extender: [name] in Treatment: 10 Visit Information History Since Last Visit All ordered tests and consults were completed: Yes Patient Arrived: Ambulatory Added or deleted any medications: No Arrival Time: 09:00 Any new allergies or adverse reactions: No Accompanied By: self Had a fall or experienced change in No Transfer Assistance: None activities of daily living that may affect Patient Identification Verified: Yes risk of falls: Secondary Verification Process Completed: Yes Signs or symptoms of abuse/neglect since last visito No Patient Requires Transmission-Based Precautions: No Hospitalized since last visit: No Patient Has Alerts: No Implantable device outside of the clinic excluding No cellular tissue based products placed in the center since last visit: Pain Present Now: No Electronic Signature(s) Signed: 02/13/2023 10:43:41 AM By: Haywood Pao CHT EMT BS , , Entered By: Haywood Pao on 02/13/2023 07:43:41 -------------------------------------------------------------------------------- Encounter Discharge Information Details Patient Name: Date of Service: [name] 02/13/2023 9:30 A M Medical Record Number: [id number] Patient Account Number: [id number] Date of Birth/Sex: Treating RN: [birth year] (72 y.o. [name] Primary Care Kruz Chiu: [name] Other Clinician: Haywood Pao Referring Owenn Rothermel: Treating Takyla Kuchera/Extender: [name] in Treatment: 10 Encounter Discharge Information Items Discharge Condition: Stable Ambulatory Status: Ambulatory Discharge Destination: Home Transportation: Private Auto Accompanied By: self Schedule Follow-up Appointment: No Clinical Summary of Care: Electronic Signature(s) Signed: 02/13/2023 12:05:02 PM By: Haywood Pao CHT EMT BS , , Entered By: Haywood Pao on 02/13/2023 09:05:02 [name] ([id number]) [id number] Page 2 of 2 -------------------------------------------------------------------------------- Vitals Details Patient Name: Date of Service: TONY, SCHUBRING MES D. 02/13/2023 9:30 A M Medical Record Number: [id number] Patient Account Number: [id number] Date of Birth/Sex: Treating RN: March 01, 1951 (72 y.o. [name] Primary Care Bianca Vester: [name] Other Clinician: Haywood Pao Referring Marl Seago: Treating Dreon Pineda/Extender: [name] in Treatment: 10 Vital Signs Time Taken: 09:05 Temperature (F): 97.6 Height (in): 66 Pulse (bpm): 74 Weight (lbs): 184.6 Respiratory Rate (breaths/min): 18 Body Mass Index (BMI): 29.8 Blood Pressure (mmHg): 136/73 Capillary Blood Glucose (mg/dl): 427 Reference Range: 80 - 120 mg / dl Electronic Signature(s) Signed: 02/13/2023 10:44:32 AM By: Haywood Pao CHT EMT BS , , Entered By: Haywood Pao on 02/13/2023 07:44:32

## 2023-02-13 NOTE — Progress Notes (Addendum)
AARN, BLOCKER ([id number]) [id number] Page 1 of 2 Visit Report for 02/13/2023 HBO Details Patient Name: Date of Service: Jose [name], [name] 02/13/2023 9:30 A M Medical Record Number: [id number] Patient Account Number: [id number] Date of Birth/Sex: Treating RN: August 07, 1950 (72 y.o. [name] Primary Care Kendry Pfarr: [name] Other Clinician: Haywood Pao Referring Terrick Allred: Treating Cassady Turano/Extender: [name] in Treatment: 10 HBO Treatment Course Details Treatment Course Number: 1 Ordering Yer Castello: Baltazar Najjar T Treatments Ordered: otal 40 HBO Treatment Start Date: 12/17/2022 HBO Indication: Late Effect of Radiation HBO Treatment Details Treatment Number: 37 Patient Type: Outpatient Chamber Type: Monoplace Chamber Serial #: [id number] Treatment Protocol: 2.0 ATA with 90 minutes oxygen, and no air breaks Treatment Details Compression Rate Down: 1.5 psi / minute De-Compression Rate Up: 2.0 psi / minute Air breaks and breathing Decompress Decompress Compress Tx Pressure Begins Reached periods Begins Ends (leave unused spaces blank) Chamber Pressure (ATA 1 2 ------2 1 ) Clock Time (24 hr) 09:26 09:37 - - - - - - 11:07 11:15 Treatment Length: 109 (minutes) Treatment Segments: 4 Vital Signs Capillary Blood Glucose Reference Range: 80 - 120 mg / dl HBO Diabetic Blood Glucose Intervention Range: <131 mg/dl or >962 mg/dl Type: Time Vitals Blood Pulse: Respiratory Temperature: Capillary Blood Glucose Pulse Action Taken: Pressure: Rate: Glucose (mg/dl): Meter #: Oximetry (%) Taken: Pre 09:05 136/73 74 18 97.6 158 none per protocol Post 11:18 185/84 70 18 97.9 107 asymptomatic for hypertension Treatment Response Treatment Toleration: Well Treatment Completion Status: Treatment Completed without Adverse Event Treatment Notes [name] arrived with normal vital signs. Blood glucose level was 158  mg/dL. He prepared for treatment. Patient's blood glucose level is well-controlled with his medication to the point that he has difficulty maintaining higher glucose level for HBOT long enough to achieve test levels for the glycemic intervention protocol. Per physician orders, patient only undergoes a blood glucose level test and as long as he is asymptomatic for hypoglycemia, patient is cleared for treatment. 8 oz of Cranberry Juice was sent in with patient for safety/intervention. After performing a safety check, he was placed in the chamber which was compressed with 100% oxygen at a rate of 1.5 psi/min after confirming normal ear equalization. He tolerated the treatment and subsequent decompression at the rate of 1.8 psi/min. He denied any issues with ear equalization and/or pain caused by barotrauma. Post-treatment blood glucose was 107 mg/dL. He drank the 8 oz CranberryJuice that was taken in for intervention after exiting the chamber. Blood pressure was higher than protocol range. 185/84 mmHg. Patient discussed reasons for higher BP. Patient prepared for departure. [name] was stable upon discharge. Kameryn Tisdel Notes No concerns with treatment given Physician HBO Attestation: I certify that I supervised this HBO treatment in accordance with Medicare guidelines. A trained emergency response team is readily available per Yes hospital policies and procedures. Continue HBOT as ordered. [address] GERBER, SOLANKI ([id number]) [id number] Page 2 of 2 Electronic Signature(s) Signed: 02/13/2023 5:18:28 PM By: Baltazar Najjar MD Previous Signature: 02/13/2023 12:03:47 PM Version By: Haywood Pao CHT EMT BS , , Entered By: Baltazar Najjar on 02/13/2023 14:15:20 -------------------------------------------------------------------------------- HBO Safety Checklist Details Patient Name: Date of Service: Jose [name] MES D. 02/13/2023 9:30 A M Medical Record Number: [id number] Patient  Account Number: [id number] Date of Birth/Sex: Treating RN: Jun 22, 1950 (72 y.o. [name] Primary Care Areyanna Figeroa: [name] Other Clinician: Haywood Pao Referring Boaz Berisha: Treating Jumana Paccione/Extender: [name] in Treatment:  10 HBO Safety Checklist Items Safety Checklist Consent Form Signed Patient voided / foley secured and emptied When did you last eato 0700 Last dose of injectable or oral agent 0730 Ostomy pouch emptied and vented if applicable NA All implantable devices assessed, documented and approved NA Intravenous access site secured and place NA Valuables secured Linens and cotton and cotton/polyester blend (less than 51% polyester) Personal oil-based products / skin lotions / body lotions removed Wigs or hairpieces removed NA Smoking or tobacco materials removed NA Books / newspapers / magazines / loose paper removed Cologne, aftershave, perfume and deodorant removed Jewelry removed (may wrap wedding band) Make-up removed NA Hair care products removed Battery operated devices (external) removed Heating patches and chemical warmers removed Titanium eyewear removed Nail polish cured greater than 10 hours NA Casting material cured greater than 10 hours NA Hearing aids removed NA Loose dentures or partials removed NA Prosthetics have been removed NA Patient demonstrates correct use of air break device (if applicable) Patient concerns have been addressed Patient grounding bracelet on and cord attached to chamber Specifics for Inpatients (complete in addition to above) Medication sheet sent with patient NA Intravenous medications needed or due during therapy sent with patient NA Drainage tubes (e.g. nasogastric tube or chest tube secured and vented) NA Endotracheal or Tracheotomy tube secured NA Cuff deflated of air and inflated with saline NA Airway suctioned NA Notes Paper version used prior to  start of treatment. Electronic Signature(s) Signed: 02/13/2023 10:45:19 AM By: Haywood Pao CHT EMT BS , , Entered By: Haywood Pao on 02/13/2023 07:45:19

## 2023-02-13 NOTE — Progress Notes (Signed)
JAIVEN, SANDUSKY ([id number]) [id number] Page 1 of 1 Visit Report for 02/12/2023 SuperBill Details Patient Name: Date of Service: Jose [name], Jose [name]. 02/12/2023 Medical Record Number: [id number] Patient Account Number: [id number] Date of Birth/Sex: Treating RN: 1950/06/25 (72 y.o. 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

## 2023-02-14 ENCOUNTER — Encounter (HOSPITAL_BASED_OUTPATIENT_CLINIC_OR_DEPARTMENT_OTHER): Payer: Medicare Other | Admitting: Internal Medicine

## 2023-02-14 DIAGNOSIS — N3040 Irradiation cystitis without hematuria: Secondary | ICD-10-CM | POA: Diagnosis not present

## 2023-02-14 LAB — GLUCOSE, CAPILLARY
Glucose-Capillary: 121 mg/dL — ABNORMAL HIGH (ref 70–99)
Glucose-Capillary: 92 mg/dL (ref 70–99)

## 2023-02-14 NOTE — Progress Notes (Addendum)
OLA, LEEDER D ([id number]) [id number] Page 1 of 2 Visit Report for 02/14/2023 HBO Details Patient Name: Date of Service: Jose [name], Jose [name] MES D. 02/14/2023 9:30 A M Medical Record Number: [id number] Patient Account Number: [id number] Date of Birth/Sex: Treating RN: 01/18/1951 (72 y.o. Jose [name], Jose [name] Primary Care Jose [name]: Jose [name] Other Clinician: Haywood Pao Referring Jose [name]: Treating Jose [name]/Extender: Jose [name] in Treatment: 10 HBO Treatment Course Details Treatment Course Number: 1 Ordering Jose [name]: Jose [name] T Treatments Ordered: otal 40 HBO Treatment Start Date: 12/17/2022 HBO Indication: Late Effect of Radiation HBO Treatment Details Treatment Number: 38 Patient Type: Outpatient Chamber Type: Monoplace Chamber Serial #: [id number] Treatment Protocol: 2.0 ATA with 90 minutes oxygen, and no air breaks Treatment Details Compression Rate Down: 1.5 psi / minute De-Compression Rate Up: 2.0 psi / minute Air breaks and breathing Decompress Decompress Compress Tx Pressure Begins Reached periods Begins Ends (leave unused spaces blank) Chamber Pressure (ATA 1 2 ------2 1 ) Clock Time (24 hr) 09:38 09:48 - - - - - - 11:18 11:27 Treatment Length: 109 (minutes) Treatment Segments: 4 Vital Signs Capillary Blood Glucose Reference Range: 80 - 120 mg / dl HBO Diabetic Blood Glucose Intervention Range: <131 mg/dl or >413 mg/dl Type: Time Vitals Blood Respiratory Capillary Blood Glucose Pulse Action Pulse: Temperature: Taken: Pressure: Rate: Glucose (mg/dl): Meter #: Oximetry (%) Taken: Pre 09:25 138/75 66 18 97.5 121 none per protocol Post 11:30 145/81 63 18 98.2 92 none per protocol Treatment Response Treatment Toleration: Well Treatment Completion Status: Treatment Completed without Adverse Event Treatment Notes Mr. Jose [name] arrived with normal vital signs. Blood glucose level was 121 mg/dL. He prepared  for treatment. Patient's blood glucose level is well-controlled with his medication to the point that he has difficulty maintaining higher glucose level for HBOT long enough to achieve test levels for the glycemic intervention protocol. Per physician orders, patient only undergoes a blood glucose level test and as long as he is asymptomatic for hypoglycemia, patient is cleared for treatment. 8 oz of Apple Juice was sent in with patient for safety/intervention. After performing a safety check, he was placed in the chamber which was compressed with 100% oxygen at a rate of 2 psi/min after confirming normal ear equalization. He tolerated the treatment and subsequent decompression at the rate of 2 psi/min. He denied any issues with ear equalization and/or pain caused by barotrauma. Post-treatment blood glucose was 92 mg/dL. He denied symptoms of hypoglycemia. He drank the 8 oz Apple Juice that was taken in for intervention after exiting the chamber. Post-treatment vital signs were within normal range. Patient prepared for departure. Mr. Jose [name] was stable upon discharge. Jose [name] Notes No concerns with treatment given Physician HBO Attestation: I certify that I supervised this HBO treatment in accordance with Medicare guidelines. A trained emergency response team is readily available per Yes hospital policies and procedures. Continue HBOT as ordered. [address] Jose [name] ([id number]) [id number] Page 2 of 2 Electronic Signature(s) Signed: 02/14/2023 5:17:16 PM By: Jose Najjar MD Previous Signature: 02/14/2023 4:13:17 PM Version By: Haywood Pao CHT EMT BS , , Entered By: Jose [name] on 02/14/2023 17:11:46 -------------------------------------------------------------------------------- HBO Safety Checklist Details Patient Name: Date of Service: Jose [name] MES D. 02/14/2023 9:30 A M Medical Record Number: [id number] Patient Account Number: [id number] Date of Birth/Sex:  Treating RN: Nov 13, 1950 (72 y.o. Jose [name] Primary Care Nyliah Nierenberg: Jose [name] Other Clinician: Haywood Pao Referring Alvina Strother: Treating Lorilei Horan/Extender: Jose [name] in Treatment: 10 HBO  Safety Checklist Items Safety Checklist Consent Form Signed Patient voided / foley secured and emptied When did you last eato 0820 - sausage egg cheese biscuit Last dose of injectable or oral agent 0730 Ostomy pouch emptied and vented if applicable NA All implantable devices assessed, documented and approved NA Intravenous access site secured and place NA Valuables secured Linens and cotton and cotton/polyester blend (less than 51% polyester) Personal oil-based products / skin lotions / body lotions removed Wigs or hairpieces removed NA Smoking or tobacco materials removed NA Books / newspapers / magazines / loose paper removed Cologne, aftershave, perfume and deodorant removed Jewelry removed (may wrap wedding band) Make-up removed NA Hair care products removed Battery operated devices (external) removed Heating patches and chemical warmers removed Titanium eyewear removed Nail polish cured greater than 10 hours NA Casting material cured greater than 10 hours NA Hearing aids removed NA Loose dentures or partials removed NA Prosthetics have been removed NA Patient demonstrates correct use of air break device (if applicable) Patient concerns have been addressed Patient grounding bracelet on and cord attached to chamber Specifics for Inpatients (complete in addition to above) Medication sheet sent with patient NA Intravenous medications needed or due during therapy sent with patient NA Drainage tubes (e.g. nasogastric tube or chest tube secured and vented) NA Endotracheal or Tracheotomy tube secured NA Cuff deflated of air and inflated with saline NA Airway suctioned NA Notes Paper version used prior to start of  treatment. Electronic Signature(s) Signed: 02/14/2023 4:09:37 PM By: Haywood Pao CHT EMT BS , , Entered By: Haywood Pao on 02/14/2023 16:09:36

## 2023-02-14 NOTE — Progress Notes (Signed)
STAS, BLACKLEDGE ([id number]) [id number] Page 1 of 2 Visit Report for 02/14/2023 Arrival Information Details Patient Name: Date of Service: IMARION, BEATY MES D. 02/14/2023 9:30 A M Medical Record Number: [id number] Patient Account Number: [id number] Date of Birth/Sex: Treating RN: 05-Sep-1950 (72 y.o. [name], [name] Primary Care Milanna Kozlov: [name] Other Clinician: Haywood Pao Referring Cielo Arias: Treating Marifer Hurd/Extender: [name] in Treatment: 10 Visit Information History Since Last Visit All ordered tests and consults were completed: Yes Patient Arrived: Ambulatory Added or deleted any medications: No Arrival Time: 09:14 Any new allergies or adverse reactions: No Accompanied By: self Had a fall or experienced change in No Transfer Assistance: None activities of daily living that may affect Patient Identification Verified: Yes risk of falls: Secondary Verification Process Completed: Yes Signs or symptoms of abuse/neglect since last visito No Patient Requires Transmission-Based Precautions: No Hospitalized since last visit: No Patient Has Alerts: No Implantable device outside of the clinic excluding No cellular tissue based products placed in the center since last visit: Pain Present Now: No Electronic Signature(s) Signed: 02/14/2023 4:07:14 PM By: Haywood Pao CHT EMT BS , , Entered By: Haywood Pao on 02/14/2023 13:07:14 -------------------------------------------------------------------------------- Encounter Discharge Information Details Patient Name: Date of Service: [name] 02/14/2023 9:30 A M Medical Record Number: [id number] Patient Account Number: [id number] Date of Birth/Sex: Treating RN: 1950-04-10 (72 y.o. [name] Primary Care Atalia Litzinger: [name] Other Clinician: Haywood Pao Referring Conard Alvira: Treating Stafford Riviera/Extender: [name] in Treatment: 10 Encounter Discharge Information Items Discharge Condition: Stable Ambulatory Status: Ambulatory Discharge Destination: Home Transportation: Private Auto Accompanied By: self Schedule Follow-up Appointment: No Clinical Summary of Care: Electronic Signature(s) Signed: 02/14/2023 4:15:07 PM By: Haywood Pao CHT EMT BS , , Entered By: Haywood Pao on 02/14/2023 13:15:06 [name] ([id number]) [id number] Page 2 of 2 -------------------------------------------------------------------------------- Vitals Details Patient Name: Date of Service: GUSTAVO, HEENEY MES D. 02/14/2023 9:30 A M Medical Record Number: [id number] Patient Account Number: [id number] Date of Birth/Sex: Treating RN: [birth year] (72 y.o. [name], [name] Primary Care Kadince Boxley: [name] Other Clinician: Haywood Pao Referring Rohen Kimes: Treating Boyd Litaker/Extender: [name] in Treatment: 10 Vital Signs Time Taken: 09:25 Temperature (F): 97.5 Height (in): 66 Pulse (bpm): 66 Weight (lbs): 184.6 Respiratory Rate (breaths/min): 18 Body Mass Index (BMI): 29.8 Blood Pressure (mmHg): 138/75 Capillary Blood Glucose (mg/dl): 742 Reference Range: 80 - 120 mg / dl Electronic Signature(s) Signed: 02/14/2023 4:07:46 PM By: Haywood Pao CHT EMT BS , , Entered By: Haywood Pao on 02/14/2023 13:07:46

## 2023-02-14 NOTE — Progress Notes (Signed)
EMONTAE, ISABELLA ([id number]) [id number] Page 1 of 1 Visit Report for 02/13/2023 SuperBill Details Patient Name: Date of Service: Jose [name], Jose [name] MES D. 02/13/2023 Medical Record Number: [id number] Patient Account Number: [id number] Date of Birth/Sex: Treating RN: 1950/05/07 (72 y.o. 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

## 2023-02-15 NOTE — Progress Notes (Signed)
ALEXAVIER, ROQUEMORE ([id number]) [id number] Page 1 of 1 Visit Report for 02/14/2023 SuperBill Details Patient Name: Date of Service: Jose [name], Jose [name]. 02/14/2023 Medical Record Number: [id number] Patient Account Number: [id number] Date of Birth/Sex: Treating RN: [birth year] (72 y.o. 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

## 2023-02-18 ENCOUNTER — Encounter (HOSPITAL_BASED_OUTPATIENT_CLINIC_OR_DEPARTMENT_OTHER): Payer: Medicare Other | Admitting: Internal Medicine

## 2023-02-18 DIAGNOSIS — N3040 Irradiation cystitis without hematuria: Secondary | ICD-10-CM | POA: Diagnosis not present

## 2023-02-18 LAB — GLUCOSE, CAPILLARY
Glucose-Capillary: 158 mg/dL — ABNORMAL HIGH (ref 70–99)
Glucose-Capillary: 113 mg/dL — ABNORMAL HIGH (ref 70–99)

## 2023-02-18 NOTE — Progress Notes (Addendum)
DOREEN, BALCERZAK [name] ([id number]) [id number] Page 1 of 2 Visit Report for 02/18/2023 HBO Details Patient Name: Date of Service: Jose [name], Jose [name]. 02/18/2023 9:30 A M Medical Record Number: [id number] Patient Account Number: [id number] Date of Birth/Sex: Treating RN: August 27, 1950 (72 y.o. Jose [name] Primary Care Jose [name]: Jose [name] Other Clinician: Haywood [name] Referring Jose [name]: Treating Jose [name]/Extender: Jose [name] in Treatment: 10 HBO Treatment Course Details Treatment Course Number: 1 Ordering Jose [name]: Jose [name] T Treatments Ordered: otal 40 HBO Treatment Start Date: 12/17/2022 HBO Indication: Late Effect of Radiation HBO Treatment Details Treatment Number: 39 Patient Type: Outpatient Chamber Type: Monoplace Chamber Serial #: [id number] Treatment Protocol: 2.0 ATA with 90 minutes oxygen, and no air breaks Treatment Details Compression Rate Down: 1.5 psi / minute De-Compression Rate Up: 2.0 psi / minute Air breaks and breathing Decompress Decompress Compress Tx Pressure Begins Reached periods Begins Ends (leave unused spaces blank) Chamber Pressure (ATA 1 2 ------2 1 ) Clock Time (24 hr) 09:46 10:00 - - - - - - 11:30 11:39 Treatment Length: 113 (minutes) Treatment Segments: 4 Vital Signs Capillary Blood Glucose Reference Range: 80 - 120 mg / dl HBO Diabetic Blood Glucose Intervention Range: <131 mg/dl or >027 mg/dl Type: Time Vitals Blood Respiratory Capillary Blood Glucose Pulse Action Pulse: Temperature: Taken: Pressure: Rate: Glucose (mg/dl): Meter #: Oximetry (%) Taken: Pre 09:24 156/78 75 18 98 158 none per protocol Post 11:42 192/75 50 18 98.4 re-measured BP. Post 11:55 177/88 62 none per protocol. Treatment Response Treatment Toleration: Well Treatment Completion Status: Treatment Completed without Adverse Event Treatment Notes [name] arrived with normal vital signs. Blood  glucose level was 158 mg/dL. He prepared for treatment. Patient's blood glucose level is well-controlled with his medication to the point that he has difficulty maintaining higher glucose level for HBOT long enough to achieve test levels for the glycemic intervention protocol. Per physician orders, patient only undergoes a blood glucose level test and as long as he is asymptomatic for hypoglycemia, patient is cleared for treatment. 8 oz of Apple Juice was sent in with patient for safety/intervention. After performing a safety check, he was placed in the chamber which was compressed with 100% oxygen at a rate of 2 psi/min after confirming normal ear equalization. He tolerated the treatment and subsequent decompression at the rate of 2 psi/min. He denied any issues with ear equalization and/or pain caused by barotrauma. Post-treatment blood glucose was 113 mg/dL. Post-treatment BP was 192/75 mmHg and Pulse was 50 bpm. Patient prepared for departure. Prior to discharge, a secondary BP was taken resulting in 177/88 mmHg and Pulse of 62 bpm. He was stable upon discharge. Daud Cayer Notes No concern with treatment given Physician HBO Attestation: I certify that I supervised this HBO treatment in accordance with Medicare guidelines. A trained emergency response team is readily available per Yes hospital policies and procedures. Jose [name], Jose [name] ([id number]) [id number] Page 2 of 2 Continue HBOT as ordered. Yes Electronic Signature(s) Signed: 02/18/2023 4:04:15 PM By: Jose [name] Previous Signature: 02/18/2023 1:44:57 PM Version By: Jose [name] CHT EMT BS , , Entered By: Jose [name] on 02/18/2023 16:00:46 -------------------------------------------------------------------------------- HBO Safety Checklist Details Patient Name: Date of Service: Jose [name]. 02/18/2023 9:30 A M Medical Record Number: [id number] Patient Account Number: [id number] Date of Birth/Sex:  Treating RN: 10/19/1950 (72 y.o. Jose [name] Primary Care Anapaola Kinsel: Jose [name] Other Clinician: Haywood [name] Referring Davin Muramoto: Treating Evander Macaraeg/Extender: Jose [name] in Treatment:  10 HBO Safety Checklist Items Safety Checklist Consent Form Signed Patient voided / foley secured and emptied When did you last eato 0715 Last dose of injectable or oral agent 0730 - glimepiride Ostomy pouch emptied and vented if applicable NA All implantable devices assessed, documented and approved NA Intravenous access site secured and place NA Valuables secured Linens and cotton and cotton/polyester blend (less than 51% polyester) Personal oil-based products / skin lotions / body lotions removed Wigs or hairpieces removed NA Smoking or tobacco materials removed NA Books / newspapers / magazines / loose paper removed Cologne, aftershave, perfume and deodorant removed Jewelry removed (may wrap wedding band) Make-up removed NA Hair care products removed Battery operated devices (external) removed Heating patches and chemical warmers removed Titanium eyewear removed Nail polish cured greater than 10 hours NA Casting material cured greater than 10 hours NA Hearing aids removed NA Loose dentures or partials removed NA Prosthetics have been removed NA Patient demonstrates correct use of air break device (if applicable) Patient concerns have been addressed Patient grounding bracelet on and cord attached to chamber Specifics for Inpatients (complete in addition to above) Medication sheet sent with patient NA Intravenous medications needed or due during therapy sent with patient NA Drainage tubes (e.g. nasogastric tube or chest tube secured and vented) NA Endotracheal or Tracheotomy tube secured NA Cuff deflated of air and inflated with saline NA Airway suctioned NA Notes Paper version used prior to start of treatment. Electronic  Signature(s) Signed: 02/18/2023 1:39:28 PM By: Jose [name] CHT EMT BS , , Entered By: Jose [name] on 02/18/2023 13:39:28

## 2023-02-18 NOTE — Progress Notes (Signed)
GUSTAF, TACHIBANA ([id number]) [id number] Page 1 of 1 Visit Report for 02/18/2023 SuperBill Details Patient Name: Date of Service: Jose [name], Jose [name] D. 02/18/2023 Medical Record Number: [id number] Patient Account Number: [id number] Date of Birth/Sex: Treating RN: 12-07-1950 (72 y.o. 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

## 2023-02-18 NOTE — Progress Notes (Signed)
HERRICK, HAFEMAN ([id number]) [id number] Page 1 of 2 Visit Report for 02/18/2023 Arrival Information Details Patient Name: Date of Service: Jose [name], Jose [name]. 02/18/2023 9:30 A M Medical Record Number: [id number] Patient Account Number: [id number] Date of Birth/Sex: Treating RN: 01-22-1951 (72 y.o. [name], [name] Primary Care Cari Burgo: [name] Other Clinician: Haywood Pao Referring Keyonda Bickle: Treating Elysia Grand/Extender: [name] in Treatment: 10 Visit Information History Since Last Visit All ordered tests and consults were completed: Yes Patient Arrived: Ambulatory Added or deleted any medications: No Arrival Time: 09:22 Any new allergies or adverse reactions: No Accompanied By: self Had a fall or experienced change in No Transfer Assistance: None activities of daily living that may affect Patient Identification Verified: Yes risk of falls: Secondary Verification Process Completed: Yes Signs or symptoms of abuse/neglect since last visito No Patient Requires Transmission-Based Precautions: No Hospitalized since last visit: No Patient Has Alerts: No Implantable device outside of the clinic excluding No cellular tissue based products placed in the center since last visit: Pain Present Now: No Electronic Signature(s) Signed: 02/18/2023 1:37:07 PM By: Haywood Pao CHT EMT BS , , Entered By: Haywood Pao on 02/18/2023 13:37:07 -------------------------------------------------------------------------------- Encounter Discharge Information Details Patient Name: Date of Service: Jose [name]. 02/18/2023 9:30 A M Medical Record Number: [id number] Patient Account Number: [id number] Date of Birth/Sex: Treating RN: August 07, 1950 (72 y.o. [name] Primary Care Canio Winokur: [name] Other Clinician: Haywood Pao Referring Tandra Rosado: Treating Tressia Labrum/Extender: [name] in Treatment: 10 Encounter Discharge Information Items Discharge Condition: Stable Ambulatory Status: Ambulatory Discharge Destination: Home Transportation: Private Auto Accompanied By: self Schedule Follow-up Appointment: No Clinical Summary of Care: Electronic Signature(s) Signed: 02/18/2023 1:47:21 PM By: Haywood Pao CHT EMT BS , , Entered By: Haywood Pao on 02/18/2023 13:47:21 Jose [name] ([id number]) [id number] Page 2 of 2 -------------------------------------------------------------------------------- Vitals Details Patient Name: Date of Service: Jose [name], Jose [name]. 02/18/2023 9:30 A M Medical Record Number: [id number] Patient Account Number: [id number] Date of Birth/Sex: Treating RN: 1950-06-18 (72 y.o. [name] Primary Care Melvin Whiteford: [name] Other Clinician: Haywood Pao Referring Dillyn Menna: Treating Yatziry Deakins/Extender: [name] in Treatment: 10 Vital Signs Time Taken: 09:24 Temperature (F): 98.0 Height (in): 66 Pulse (bpm): 75 Weight (lbs): 184.6 Respiratory Rate (breaths/min): 18 Body Mass Index (BMI): 29.8 Blood Pressure (mmHg): 156/78 Capillary Blood Glucose (mg/dl): 387 Reference Range: 80 - 120 mg / dl Electronic Signature(s) Signed: 02/18/2023 1:37:47 PM By: Haywood Pao CHT EMT BS , , Entered By: Haywood Pao on 02/18/2023 13:37:46

## 2023-02-19 ENCOUNTER — Encounter (HOSPITAL_BASED_OUTPATIENT_CLINIC_OR_DEPARTMENT_OTHER): Payer: Medicare Other | Admitting: Internal Medicine

## 2023-02-19 DIAGNOSIS — N3040 Irradiation cystitis without hematuria: Secondary | ICD-10-CM | POA: Diagnosis not present

## 2023-02-19 LAB — GLUCOSE, CAPILLARY
Glucose-Capillary: 120 mg/dL — ABNORMAL HIGH (ref 70–99)
Glucose-Capillary: 142 mg/dL — ABNORMAL HIGH (ref 70–99)

## 2023-02-19 NOTE — Progress Notes (Addendum)
Jose [name] ([id number]) [id number] Page 1 of 3 Visit Report for 02/19/2023 HBO Details Patient Name: Date of Service: Jose [name], Jose [name]. 02/19/2023 9:30 A M Medical Record Number: [id number] Patient Account Number: [id number] Date of Birth/Sex: Treating RN: 1950-06-09 (72 y.o. [name], [name] Primary Care Lantz Hermann: [name] Other Clinician: Haywood Pao Referring Joanmarie Tsang: Treating Britany Callicott/Extender: [name] in Treatment: 11 HBO Treatment Course Details Treatment Course Number: 1 Ordering Bayleigh Loflin: Baltazar Najjar T Treatments Ordered: otal 40 HBO Treatment 12/17/2022 Start Date: HBO Indication: Late Effect of Radiation HBO Treatment 02/19/2023 End Date: HBO Discharge Treatment Series Complete; Non-Wound Protocol Outcome: Completed with Symptom Relief HBO Treatment Details Treatment Number: 40 Patient Type: Outpatient Chamber Type: Monoplace Chamber Serial #: [id number] Treatment Protocol: 2.0 ATA with 90 minutes oxygen, and no air breaks Treatment Details Compression Rate Down: 1.0 psi / minute De-Compression Rate Up: 2.0 psi / minute Air breaks and breathing Decompress Decompress Compress Tx Pressure Begins Reached periods Begins Ends (leave unused spaces blank) Chamber Pressure (ATA 1 2 ------2 1 ) Clock Time (24 hr) 09:35 09:51 - - - - - - 11:21 11:28 Treatment Length: 113 (minutes) Treatment Segments: 4 Vital Signs Capillary Blood Glucose Reference Range: 80 - 120 mg / dl HBO Diabetic Blood Glucose Intervention Range: <131 mg/dl or >782 mg/dl Type: Time Vitals Blood Respiratory Capillary Blood Glucose Pulse Action Pulse: Temperature: Taken: Pressure: Rate: Glucose (mg/dl): Meter #: Oximetry (%) Taken: Pre 09:18 125/69 72 18 98 120 none per protocol Post 11:28 137/83 85 18 98.2 142 Treatment Response Treatment Toleration: Well Treatment Completion Status: Treatment Completed without  Adverse Event Treatment Notes Jose [name] arrived with normal vital signs. Blood glucose level was 120 mg/dL. He prepared for treatment. Patient's blood glucose level is well-controlled with his medication to the point that he has difficulty maintaining higher glucose level for HBOT long enough to achieve test levels for the glycemic intervention protocol. Per physician orders, patient only undergoes a blood glucose level test and as long as he is asymptomatic for hypoglycemia, patient is cleared for treatment. 8 oz of cranberry juice was sent in today with patient for safety/intervention. After performing a safety check, he was placed in the chamber which was compressed with 100% oxygen at a rate of 2 psi/min after confirming normal ear equalization. He tolerated the treatment and subsequent decompression at the rate of 2 psi/min. He denied any issues with ear equalization and/or pain caused by barotrauma. He did drink the cranberry juice prior to end of treatment. Post-treatment blood glucose was 142 mg/dL. Post-treatment vital signs were within normal range. He was stable upon discharge. Jose [name] Notes No concern with treatment given. I did interview the patient's after his treatment as this was his last treatment. He tells me he has not had any further hematuria. He does have urinary frequency and nocturia which has not really changed although in discussion he seems to drink copious amounts of free fluid/water. I am wondering if this is adding that to the problem. Physician HBO Attestation: I certify that I supervised this HBO treatment in accordance with Medicare Jose [name], Jose [name] ([id number]) [id number] Page 2 of 3 guidelines. A trained emergency response team is readily available per Yes hospital policies and procedures. Continue HBOT as ordered. Yes Electronic Signature(s) Signed: 03/01/2023 11:36:37 AM By: Haywood Pao CHT EMT BS , , Signed: 03/05/2023 12:21:03 PM  By: Baltazar Najjar MD Previous Signature: 02/19/2023 4:32:13 PM Version By: Baltazar Najjar MD Previous Signature:  02/19/2023 2:35:53 PM Version By: Haywood Pao CHT EMT BS , , Entered By: Haywood Pao on 03/01/2023 08:36:37 -------------------------------------------------------------------------------- HBO Safety Checklist Details Patient Name: Date of Service: Jose [name]. 02/19/2023 9:30 A M Medical Record Number: [id number] Patient Account Number: [id number] Date of Birth/Sex: Treating RN: September 02, 1950 (72 y.o. [name], [name] Primary Care Jaidy Cottam: [name] Other Clinician: Haywood Pao Referring Loveda Colaizzi: Treating Dhwani Venkatesh/Extender: [name] in Treatment: 11 HBO Safety Checklist Items Safety Checklist Consent Form Signed Patient voided / foley secured and emptied When did you last eato 0735 Last dose of injectable or oral agent 0800 Ostomy pouch emptied and vented if applicable NA All implantable devices assessed, documented and approved NA Intravenous access site secured and place NA Valuables secured Linens and cotton and cotton/polyester blend (less than 51% polyester) Personal oil-based products / skin lotions / body lotions removed Wigs or hairpieces removed NA Smoking or tobacco materials removed NA Books / newspapers / magazines / loose paper removed Cologne, aftershave, perfume and deodorant removed Jewelry removed (may wrap wedding band) Make-up removed NA Hair care products removed Battery operated devices (external) removed Heating patches and chemical warmers removed Titanium eyewear removed Nail polish cured greater than 10 hours NA Casting material cured greater than 10 hours NA Hearing aids removed NA Loose dentures or partials removed NA Prosthetics have been removed NA Patient demonstrates correct use of air break device (if applicable) Patient concerns have been  addressed Patient grounding bracelet on and cord attached to chamber Specifics for Inpatients (complete in addition to above) Medication sheet sent with patient NA Intravenous medications needed or due during therapy sent with patient NA Drainage tubes (e.g. nasogastric tube or chest tube secured and vented) NA Endotracheal or Tracheotomy tube secured NA Cuff deflated of air and inflated with saline NA Airway suctioned NA Notes Paper version used prior to start of treatment. Jose [name], Jose [name] ([id number]) [id number] Page 3 of 3 Electronic Signature(s) Signed: 02/19/2023 2:31:51 PM By: Haywood Pao CHT EMT BS , , Entered By: Haywood Pao on 02/19/2023 11:31:51

## 2023-02-19 NOTE — Progress Notes (Signed)
ARDON, DUGGAL ([id number]) [id number] Page 1 of 2 Visit Report for 02/19/2023 Arrival Information Details Patient Name: Date of Service: VITALI, CIANFLONE MES D. 02/19/2023 9:30 A M Medical Record Number: [id number] Patient Account Number: [id number] Date of Birth/Sex: Treating RN: 1951-01-02 (72 y.o. [name], [name] Primary Care Kerilyn Cortner: [name] Other Clinician: Haywood Pao Referring Rian Koon: Treating Daymien Goth/Extender: [name] in Treatment: 11 Visit Information History Since Last Visit All ordered tests and consults were completed: Yes Patient Arrived: Ambulatory Added or deleted any medications: No Arrival Time: 09:12 Any new allergies or adverse reactions: No Accompanied By: self Had a fall or experienced change in No Transfer Assistance: None activities of daily living that may affect Patient Identification Verified: Yes risk of falls: Secondary Verification Process Completed: Yes Signs or symptoms of abuse/neglect since last visito No Patient Requires Transmission-Based Precautions: No Hospitalized since last visit: No Patient Has Alerts: No Implantable device outside of the clinic excluding No cellular tissue based products placed in the center since last visit: Pain Present Now: No Electronic Signature(s) Signed: 02/19/2023 2:30:21 PM By: Haywood Pao CHT EMT BS , , Entered By: Haywood Pao on 02/19/2023 11:30:21 -------------------------------------------------------------------------------- Encounter Discharge Information Details Patient Name: Date of Service: [name] 02/19/2023 9:30 A M Medical Record Number: [id number] Patient Account Number: [id number] Date of Birth/Sex: Treating RN: 05/13/1950 (72 y.o. [name] Primary Care Lexa Coronado: [name] Other Clinician: Haywood Pao Referring Amani Nodarse: Treating Aailyah Dunbar/Extender: [name] in Treatment: 11 Encounter Discharge Information Items Discharge Condition: Stable Ambulatory Status: Ambulatory Discharge Destination: Home Transportation: Private Auto Accompanied By: self Schedule Follow-up Appointment: No Clinical Summary of Care: Electronic Signature(s) Signed: 02/19/2023 2:36:46 PM By: Haywood Pao CHT EMT BS , , Entered By: Haywood Pao on 02/19/2023 11:36:46 [name] ([id number]) [id number] Page 2 of 2 -------------------------------------------------------------------------------- Vitals Details Patient Name: Date of Service: ANJUAN, MESTON MES D. 02/19/2023 9:30 A M Medical Record Number: [id number] Patient Account Number: [id number] Date of Birth/Sex: Treating RN: May 25, 1950 (72 y.o. [name], [name] Primary Care Nyia Tsao: [name] Other Clinician: Haywood Pao Referring Geraldine Tesar: Treating Kiyoshi Schaab/Extender: [name] in Treatment: 11 Vital Signs Time Taken: 09:18 Temperature (F): 98.0 Height (in): 66 Pulse (bpm): 72 Weight (lbs): 184.6 Respiratory Rate (breaths/min): 18 Body Mass Index (BMI): 29.8 Blood Pressure (mmHg): 125/69 Capillary Blood Glucose (mg/dl): 244 Reference Range: 80 - 120 mg / dl Electronic Signature(s) Signed: 02/19/2023 2:30:43 PM By: Haywood Pao CHT EMT BS , , Entered By: Haywood Pao on 02/19/2023 11:30:43

## 2023-02-20 NOTE — Progress Notes (Signed)
MANSEL, KIRGAN ([id number]) [id number] Page 1 of 1 Visit Report for 02/19/2023 SuperBill Details Patient Name: Date of Service: Jose [name], Jose [name]. 02/19/2023 Medical Record Number: [id number] Patient Account Number: [id number] Date of Birth/Sex: Treating RN: September 06, 1950 (72 y.o. 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

## 2023-02-24 ENCOUNTER — Other Ambulatory Visit: Payer: Self-pay | Admitting: Internal Medicine

## 2023-02-28 ENCOUNTER — Telehealth: Payer: Self-pay

## 2023-02-28 DIAGNOSIS — E119 Type 2 diabetes mellitus without complications: Secondary | ICD-10-CM

## 2023-02-28 NOTE — Telephone Encounter (Signed)
Copied from CRM [id number]. Topic: Clinical - Medication Refill >> Feb 28, 2023 11:16 AM Orinda Kenner C wrote: Most Recent Primary Care Visit:  Provider: Henderson Cloud  Department: [facility]  Visit Type: MEDICARE AWV, SEQUENTIAL  Date: 06/06/2022  Medication: glimepiride (AMARYL) 2 MG  Has the patient contacted their pharmacy? Yes (Agent: If no, request that the patient contact the pharmacy for the refill. If patient does not wish to contact the pharmacy document the reason why and proceed with request.) (Agent: If yes, when and what did the pharmacy advise?)  Is this the correct pharmacy for this prescription? Yes If no, delete pharmacy and type the correct one.  This is the patient's preferred pharmacy:  [id number] - [place], [place] - [address] [place] [address] Phone: [phone] Fax: [phone]   Has the prescription been filled recently?   Is the patient out of the medication? Yes, toke last one yesterday  Has the patient been seen for an appointment in the last year OR does the patient have an upcoming appointment?   Can we respond through MyChart? No, pls c/b [phone]   Agent: Please be advised that Rx refills may take up to 3 business days. We ask that you follow-up with your pharmacy.

## 2023-02-28 NOTE — Telephone Encounter (Signed)
Copied from CRM [id number]. Topic: Clinical - Medication Question >> Feb 28, 2023 11:19 AM Orinda Kenner C wrote: Reason for CRM: Pt saw a commercial on a monitor blood sugar device called Dexsom, pt is asking if he could [place] that? Will it need to be a presciption? Pls advise and c/b [phone]

## 2023-03-01 ENCOUNTER — Telehealth: Payer: Self-pay

## 2023-03-01 NOTE — Telephone Encounter (Unsigned)
Copied from CRM [id number]. Topic: Clinical - Medication Refill >> Mar 01, 2023 11:58 AM Fuller Mandril wrote: Most Recent Primary Care Visit:  Provider: Henderson Cloud  Department: [facility]  Visit Type: MEDICARE AWV, SEQUENTIAL  Date: 06/06/2022  Medication: glimepiride (AMARYL) 2 MG tablet - diabetes - currently out   Has the patient contacted their pharmacy? Yes - pharmacy sent request 12/22 (Agent: If no, request that the patient contact the pharmacy for the refill. If patient does not wish to contact the pharmacy document the reason why and proceed with request.) (Agent: If yes, when and what did the pharmacy advise?)  Is this the correct pharmacy for this prescription? Yes If no, delete pharmacy and type the correct one.  This is the patient's preferred pharmacy:  [id number] - [place], [place] - [address] [place] [address] Phone: [phone] Fax: [phone]   Has the prescription been filled recently? No  Is the patient out of the medication? Yes  Has the patient been seen for an appointment in the last year OR does the patient have an upcoming appointment? Yes  Can we respond through MyChart? No  Agent: Please be advised that Rx refills may take up to 3 business days. We ask that you follow-up with your pharmacy.

## 2023-03-04 ENCOUNTER — Other Ambulatory Visit: Payer: Self-pay | Admitting: Internal Medicine

## 2023-03-04 DIAGNOSIS — E119 Type 2 diabetes mellitus without complications: Secondary | ICD-10-CM

## 2023-03-04 MED ORDER — DEXCOM G7 SENSOR MISC: 1.0000 | 4 refills | Status: DC

## 2023-03-04 MED ORDER — GLIMEPIRIDE 2 MG PO TABS: 2.0000 mg | ORAL_TABLET | Freq: Every day | ORAL | 2 refills | Status: DC

## 2023-03-04 MED ORDER — DEXCOM G7 RECEIVER DEVI: 1.0000 | 0 refills | Status: DC

## 2023-03-04 NOTE — Progress Notes (Signed)
COLLEEN, LAMUNYON ([id number]) [id number] Page 1 of 2 Visit Report for 02/07/2023 Arrival Information Details Patient Name: Date of Service: ZYKEL, OZMENT MES D. 02/07/2023 9:30 A M Medical Record Number: [id number] Patient Account Number: [id number] Date of Birth/Sex: Treating RN: [birth year] (72 y.o. [name] Primary Care Ovie Cornelio: [name] Other Clinician: Daryll Brod Referring Herbert Aguinaldo: Treating Emmanual Gauthreaux/Extender: [name] in Treatment: 9 Visit Information History Since Last Visit Added or deleted any medications: No Patient Arrived: Ambulatory Any new allergies or adverse reactions: No Arrival Time: 09:40 Had a fall or experienced change in No Accompanied By: self activities of daily living that may affect Transfer Assistance: None risk of falls: Patient Identification Verified: Yes Signs or symptoms of abuse/neglect since last visito No Secondary Verification Process Completed: Yes Hospitalized since last visit: No Patient Requires Transmission-Based Precautions: No Implantable device outside of the clinic excluding No Patient Has Alerts: No cellular tissue based products placed in the center since last visit: Pain Present Now: No Electronic Signature(s) Signed: 03/04/2023 3:37:49 PM By: Demetria Pore Entered By: Demetria Pore on 02/07/2023 10:14:44 -------------------------------------------------------------------------------- Encounter Discharge Information Details Patient Name: Date of Service: [name] 02/07/2023 9:30 A M Medical Record Number: [id number] Patient Account Number: [id number] Date of Birth/Sex: Treating RN: [birth year] (72 y.o. [name] Primary Care Shaylin Blatt: [name] Other Clinician: Daryll Brod Referring Berenise Hunton: Treating Adwoa Axe/Extender: [name] in Treatment: 9 Encounter Discharge Information Items Discharge  Condition: Stable Ambulatory Status: Ambulatory Discharge Destination: Home Transportation: Private Auto Accompanied By: self Schedule Follow-up Appointment: Yes Clinical Summary of Care: Electronic Signature(s) Signed: 03/04/2023 3:37:49 PM By: Francoise Ceo By: Demetria Pore on 02/07/2023 10:16:35 [name] ([id number]) [id number] Page 2 of 2 -------------------------------------------------------------------------------- Vitals Details Patient Name: Date of Service: CHAVEZ, CHEEVER MES D. 02/07/2023 9:30 A M Medical Record Number: [id number] Patient Account Number: [id number] Date of Birth/Sex: Treating RN: 04/16/1950 (72 y.o. [name] Primary Care Helmuth Recupero: [name] Other Clinician: Daryll Brod Referring Xane Amsden: Treating Jet Traynham/Extender: [name] in Treatment: 9 Vital Signs Time Taken: 09:45 Temperature (F): 97.0 Height (in): 66 Pulse (bpm): 67 Weight (lbs): 184.6 Respiratory Rate (breaths/min): 18 Body Mass Index (BMI): 29.8 Blood Pressure (mmHg): 149/78 Capillary Blood Glucose (mg/dl): 366 Reference Range: 80 - 120 mg / dl Electronic Signature(s) Signed: 03/04/2023 3:37:49 PM By: Demetria Pore Entered By: Demetria Pore on 02/07/2023 10:14:48

## 2023-03-04 NOTE — Progress Notes (Signed)
TRAESHAWN, ALBANY D ([id number]) [id number] Page 1 of 2 Visit Report for 02/07/2023 HBO Details Patient Name: Date of Service: Jose [name], [name] 02/07/2023 9:30 A M Medical Record Number: [id number] Patient Account Number: [id number] Date of Birth/Sex: Treating RN: 1950/06/13 (72 y.o. [name] Primary Care Li Bobo: [name] Other Clinician: Daryll Brod Referring Seydou Hearns: Treating Thompson Mckim/Extender: [name] in Treatment: 9 HBO Treatment Course Details Treatment Course Number: 1 Ordering Chaelyn Bunyan: Baltazar Najjar T Treatments Ordered: otal 40 HBO Treatment Start Date: 12/17/2022 HBO Indication: Late Effect of Radiation HBO Treatment Details Treatment Number: 34 Patient Type: Outpatient Chamber Type: Monoplace Chamber Serial #: [id number] Treatment Protocol: 2.0 ATA with 90 minutes oxygen, and no air breaks Treatment Details Compression Rate Down: 2.0 psi / minute De-Compression Rate Up: 2.0 psi / minute Air breaks and breathing Decompress Decompress Compress Tx Pressure Begins Reached periods Begins Ends (leave unused spaces blank) Chamber Pressure (ATA 1 2 ------2 1 ) Clock Time (24 hr) 9:51 10:04 - - - - - - 11:34 11:42 Treatment Length: 111 (minutes) Treatment Segments: 4 Vital Signs Capillary Blood Glucose Reference Range: 80 - 120 mg / dl HBO Diabetic Blood Glucose Intervention Range: <131 mg/dl or >962 mg/dl Time Vitals Blood Respiratory Capillary Blood Glucose Pulse Action Type: Pulse: Temperature: Taken: Pressure: Rate: Glucose (mg/dl): Meter #: Oximetry (%) Taken: Pre 09:45 149/78 67 18 97 108 1 Post 11:45 192/82 58 18 98.4 100 1 Treatment Response Treatment Toleration: Well Treatment Completion Status: Treatment Completed without Adverse Event Hildur Bayer Notes No concern with treatment given Physician HBO Attestation: I certify that I supervised this HBO treatment in accordance  with Medicare guidelines. A trained emergency response team is readily available per Yes hospital policies and procedures. Continue HBOT as ordered. Yes Electronic Signature(s) Signed: 02/07/2023 5:41:20 PM By: Baltazar Najjar MD Entered By: Baltazar Najjar on 02/07/2023 14:36:05 Marvis Repress D ([id number]) [id number] Page 2 of 2 -------------------------------------------------------------------------------- HBO Safety Checklist Details Patient Name: Date of Service: Jose [name], [name] 02/07/2023 9:30 A M Medical Record Number: [id number] Patient Account Number: [id number] Date of Birth/Sex: Treating RN: 06/02/1950 (72 y.o. [name] Primary Care Deshondra Worst: [name] Other Clinician: Daryll Brod Referring Kahlyn Shippey: Treating Shannin Naab/Extender: [name] in Treatment: 9 HBO Safety Checklist Items Safety Checklist Consent Form Signed Patient voided / foley secured and emptied When did you last eato 7:15 oatmeal, bacon, coffee Last dose of injectable or oral agent 7:45 Ostomy pouch emptied and vented if applicable NA All implantable devices assessed, documented and approved NA Intravenous access site secured and place NA Valuables secured Linens and cotton and cotton/polyester blend (less than 51% polyester) Personal oil-based products / skin lotions / body lotions removed Wigs or hairpieces removed NA Smoking or tobacco materials removed Books / newspapers / magazines / loose paper removed Cologne, aftershave, perfume and deodorant removed Jewelry removed (may wrap wedding band) Make-up removed NA Hair care products removed Battery operated devices (external) removed NA Heating patches and chemical warmers removed NA Titanium eyewear removed NA Nail polish cured greater than 10 hours NA Casting material cured greater than 10 hours NA Hearing aids removed Loose dentures or partials  removed NA Prosthetics have been removed NA Patient demonstrates correct use of air break device (if applicable) Patient concerns have been addressed Patient grounding bracelet on and cord attached to chamber Specifics for Inpatients (complete in addition to above) Medication sheet sent with patient NA Intravenous medications needed or due  during therapy sent with patient NA Drainage tubes (e.g. nasogastric tube or chest tube secured and vented) NA Endotracheal or Tracheotomy tube secured NA Cuff deflated of air and inflated with saline NA Airway suctioned NA Notes Paper version used prior to treatment start. Electronic Signature(s) Signed: 03/04/2023 3:37:49 PM By: Demetria Pore Entered By: Demetria Pore on 02/07/2023 10:15:00

## 2023-03-04 NOTE — Progress Notes (Signed)
JEANNE, GAUNTLETT ([id number]) [id number] Page 1 of 1 Visit Report for 02/07/2023 SuperBill Details Patient Name: Date of Service: BRONISLAUS, JEFF MES D. 02/07/2023 Medical Record Number: [id number] Patient Account Number: [id number] Date of Birth/Sex: Treating RN: June 18, 1950 (72 y.o. 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

## 2023-03-31 ENCOUNTER — Other Ambulatory Visit: Payer: Self-pay | Admitting: Internal Medicine

## 2023-04-01 ENCOUNTER — Other Ambulatory Visit: Payer: Self-pay | Admitting: Internal Medicine

## 2023-05-13 ENCOUNTER — Telehealth: Payer: Self-pay

## 2023-05-13 NOTE — Telephone Encounter (Signed)
 Called the office.  It is closed for lunch 1 pm -2 pm.

## 2023-05-13 NOTE — Telephone Encounter (Signed)
 Copied from CRM [id number]. Topic: Clinical - Medication Question >> May 13, 2023 11:03 AM Sonny Dandy B wrote: Reason for CRM: Cynd denstist called  to speak with provider pt is currently in the chair, they a call back regarding pt medical information. Please call [phone]

## 2023-05-14 ENCOUNTER — Telehealth: Payer: Self-pay

## 2023-05-14 NOTE — Telephone Encounter (Signed)
 Copied from CRM [id number]. Topic: General - Other >> May 14, 2023 12:06 PM Jose [name] wrote: Reason for CRM: Patient called in regarding a missed call from Fleet Contras, is requesting a callback regarding this

## 2023-05-14 NOTE — Telephone Encounter (Signed)
 Spoke to someone at the dentist office.  They would like to know if the patient should continue taking his pre-med?

## 2023-05-14 NOTE — Telephone Encounter (Signed)
 Answered. See phone note.

## 2023-05-14 NOTE — Telephone Encounter (Signed)
 Patient states that he has been given antibiotics in the past before a dental procedure.  He is having dental work done next week.  Should he have a pre - dental medication?

## 2023-05-14 NOTE — Telephone Encounter (Signed)
Attempted to call the patient, but the voicemail box is full. 

## 2023-05-16 LAB — PSA: PSA: 0.23

## 2023-05-16 NOTE — Telephone Encounter (Signed)
 Patient is aware

## 2023-05-29 ENCOUNTER — Encounter: Payer: Self-pay | Admitting: Internal Medicine

## 2023-07-02 ENCOUNTER — Ambulatory Visit: Payer: Self-pay

## 2023-07-02 NOTE — Telephone Encounter (Signed)
 Chief Complaint: Bilateral leg pain from thighs to feet Symptoms: 6/10 pain, weakness to legs, affecting ability to walk Frequency: Ongoing for a while per patient Pertinent Negatives: Patient denies other symptoms Disposition: [] ED /[] Urgent Care (no appt availability in office) / [x] Appointment(In office/virtual)/ []  [facility]/ [] Home Care/ [] Refused Recommended Disposition /[] [facility]/ []  Follow-up with PCP Additional Notes: Patient says this has been ongoing pain and he's afraid that it could be neuropathy. He says the pain feels like it's in the nerves and it's all the way through his legs from thighs down through to his feet. He says it's affecting his ability to walk sometimes. Advised OV needed, he agrees. Scheduled for Thursday with PCP.   Copied from CRM [id number]. Topic: Clinical - Red Word Triage >> Jul 02, 2023  9:25 AM Orien Bird wrote: [facility] that prompted transfer to Nurse Triage: Nerve pain in his legs and stated he is not stable it goes from his upper thighs to his legs he state muscle strength is not there and he's in a lot pain Reason for Disposition  [1] MODERATE pain (e.g., interferes with normal activities, limping) AND [2] present > 3 days  Answer Assessment - Initial Assessment Questions 1. ONSET: "When did the pain start?"      It's been there "awhile" 2. LOCATION: "Where is the pain located?"      Upper thighs down to feet 3. PAIN: "How bad is the pain?"    (Scale 1-10; or mild, moderate, severe)   -  MILD (1-3): doesn't interfere with normal activities    -  MODERATE (4-7): interferes with normal activities (e.g., work or school) or awakens from sleep, limping    -  SEVERE (8-10): excruciating pain, unable to do any normal activities, unable to walk     6  4. CAUSE: "What do you think is causing the leg pain?"     Don't know possible neuropathy 6. OTHER SYMPTOMS: "Do you have any other symptoms?" (e.g., chest pain, back pain, breathing  difficulty, swelling, rash, fever, numbness, weakness)     Losing ability to stand and walk, weakness to legs  Protocols used: Leg Pain-A-AH

## 2023-07-04 ENCOUNTER — Ambulatory Visit (INDEPENDENT_AMBULATORY_CARE_PROVIDER_SITE_OTHER): Admitting: Internal Medicine

## 2023-07-04 VITALS — BP 120/70 | HR 72 | Temp 98.1°F | Wt 194.4 lb

## 2023-07-04 DIAGNOSIS — R200 Anesthesia of skin: Secondary | ICD-10-CM | POA: Diagnosis not present

## 2023-07-04 DIAGNOSIS — C61 Malignant neoplasm of prostate: Secondary | ICD-10-CM | POA: Diagnosis not present

## 2023-07-04 DIAGNOSIS — M79604 Pain in right leg: Secondary | ICD-10-CM

## 2023-07-04 DIAGNOSIS — E119 Type 2 diabetes mellitus without complications: Secondary | ICD-10-CM | POA: Diagnosis not present

## 2023-07-04 DIAGNOSIS — M79605 Pain in left leg: Secondary | ICD-10-CM

## 2023-07-04 DIAGNOSIS — Z7984 Long term (current) use of oral hypoglycemic drugs: Secondary | ICD-10-CM

## 2023-07-04 LAB — POCT GLYCOSYLATED HEMOGLOBIN (HGB A1C): Hemoglobin A1C: 5.1 % (ref 4.0–5.6)

## 2023-07-05 LAB — VITAMIN B12: Vitamin B-12: 613 pg/mL (ref 211–911)

## 2023-07-08 ENCOUNTER — Encounter: Payer: Self-pay | Admitting: Internal Medicine

## 2023-07-08 NOTE — Progress Notes (Signed)
 Established Patient Office Visit     CC/Reason for Visit: Bilateral leg pain and numbness  HPI: Jose [name] is a 73 y.o. male who is coming in today for the above mentioned reasons. 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  Ever since mid December he has had a history of progressing bilateral leg numbness and pain.  Starts from the waist down.  He is in tears in the office today.  Describes it as a pins and needle sensation from his inguinal crease bilaterally all the way down to his toes, pain as well "like electrical shocks".  Has had several falls at home without major injuries.   Past Medical/Surgical History: Past Medical History:  Diagnosis Date   ALCOHOL ABUSE 07/27/2008   Allergy     ANXIETY 07/27/2008   Arthritis    Bifascicular block    beta blocker stopped due to profound bradycardia   CAD 07/27/2008   a. s/p Endeavor DES to CFX 07/2008; residual OM1 70%, EF 45%;  b. relook cath 5/10: patent stent in CFX;  c.  LHC (8/15):  prox LAD 30%, OM1 50% at bifurcation, OM1 sub-branch 50%, mid AVCFX stent patent, EF 55-65% - no change from 2010 >>> Med Rx (after abnormal nuc)    Cataract    Diabetes mellitus without complication (HCC)    GERD 09/18/2006   History of TIA (transient ischemic attack) 07/2008   Hx of adenomatous colonic polyps    Hx of cardiovascular stress test    ETT-Myoview (09/2013):  Partially fixed defect with some reversibility - cannot r/o inf ischemia vs diaph atten, EF 51%; mod risk   Hyperlipidemia    HYPERTENSION 09/18/2006   LOW BACK PAIN 09/18/2006   Myocardial infarction (HCC) 2010   Numbness and tingling    finger tips at times   Rosacea 07/27/2008   SEIZURE DISORDER 09/18/2006   none since 2008   Squamous cell carcinoma of skin 07/19/2021   in situ - right nasal sidewall   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   Urinary tract infection  start cipro  01-07-2012    Past Surgical History:  Procedure Laterality Date   CORONARY STENT PLACEMENT     drug eluting   GOLD SEED IMPLANT N/A 04/11/2021   Procedure: GOLD SEED IMPLANT SPACE OAR;  Surgeon: Roxane Copp, MD;  Location: [facility];  Service: Urology;  Laterality: N/A;   HERNIA REPAIR     INGUINAL HERNIA REPAIR  01/14/2012   Procedure: LAPAROSCOPIC INGUINAL HERNIA;  Surgeon: Shela Derby, MD;  Location: [facility];  Service: General;  Laterality: Right;   INSERTION OF MESH  01/14/2012   Procedure: INSERTION OF MESH;  Surgeon: Shela Derby, MD;  Location: [facility];  Service: General;  Laterality: Right;   KNEE SURGERY     LEFT HEART CATHETERIZATION WITH CORONARY ANGIOGRAM N/A 10/21/2013   Procedure: LEFT HEART CATHETERIZATION WITH CORONARY ANGIOGRAM;  Surgeon: Arlander Bellman, MD;  Location: [facility];  Service: Cardiovascular;  Laterality: N/A;   MEDIAL PARTIAL KNEE REPLACEMENT Left     Social History:  reports that he has never smoked. He quit smokeless tobacco use about 11 years ago.  His smokeless tobacco use included chew. He reports that he does not drink alcohol and does not use drugs.  Allergies: No Known Allergies  Family History:  Family History  Problem Relation Age of Onset   Lung cancer  Mother    Hypertension Mother    Diabetes Mother    Hyperlipidemia Mother    Emphysema Father    Heart failure Father    Melanoma Sister    Melanoma Paternal Grandmother    Heart attack Other    Colon cancer Neg Hx    Stroke Neg Hx    Esophageal cancer Neg Hx    Liver cancer Neg Hx    Pancreatic cancer Neg Hx    Stomach cancer Neg Hx    Rectal cancer Neg Hx      Current Outpatient Medications:    acetaminophen  (TYLENOL ) 500 MG tablet, Take 500-1,000 mg by mouth as needed for moderate pain., Disp: , Rfl:    ALPRAZolam  (XANAX ) 0.5 MG tablet, Take 1 tablet (0.5 mg total) by mouth 3 (three) times daily as needed., Disp: 60 tablet, Rfl: 0    amLODipine  (NORVASC ) 5 MG tablet, Take 5 mg by mouth daily., Disp: , Rfl:    aspirin  EC 81 MG tablet, Take 81 mg by mouth at bedtime., Disp: , Rfl:    atorvastatin  (LIPITOR) 20 MG tablet, TAKE 1 TABLET BY MOUTH EVERY DAY, Disp: 90 tablet, Rfl: 1   Azelaic Acid  15 % cream, APPLY 1 APPLICATION TOPICALLY AS DIRECTED. AFTER SKIN IS THOROUGHLY WASHED AND PATTED DRY, GENTLY BUT THOROUGHLY MASSAGE A THIN FILM OF AZELAIC ACID  INTO THE AFFECTED AREA TWICE DAILY, IN THE MORNING AND EVENING. APPLIED TO FACE., Disp: 50 g, Rfl: 0   bisoprolol  (ZEBETA ) 10 MG tablet, TAKE 1 TABLET BY MOUTH EVERY DAY, Disp: 90 tablet, Rfl: 3   Continuous Glucose Receiver (DEXCOM G7 RECEIVER) DEVI, 1 APPLICATOR BY DOES NOT APPLY ROUTE AS DIRECTED. USE AS DIRECTED TO CHECK BLOOD GLUCOSE, Disp: 1 each, Rfl: 0   Continuous Glucose Sensor (DEXCOM G7 SENSOR) MISC, 1 INSERT BY DOES NOT APPLY ROUTE AS DIRECTED. USE AS DIRECTED TO CHECK BLOOD GLUCOSE, Disp: 4 each, Rfl: 4   EPINEPHrine  0.3 mg/0.3 mL IJ SOAJ injection, Inject 0.3 mg into the muscle as needed for anaphylaxis., Disp: 1 each, Rfl: 0   glimepiride  (AMARYL ) 2 MG tablet, Take 1 tablet (2 mg total) by mouth daily with breakfast., Disp: 90 tablet, Rfl: 2   halobetasol (ULTRAVATE) 0.05 % cream, Apply 1 application  topically 2 (two) times daily as needed (dry skin)., Disp: , Rfl:    Lancets (ONETOUCH DELICA PLUS LANCET30G) MISC, USE AS DIRECTED ONCE DAILY, Disp: 100 each, Rfl: 0   melatonin 3 MG TABS tablet, Take 3 mg by mouth at bedtime., Disp: , Rfl:    Multiple Vitamins-Minerals (MULTIVITAMIN WITH MINERALS) tablet, Take 1 tablet by mouth at bedtime., Disp: , Rfl:    ONETOUCH VERIO test strip, USE TO TEST BLOOD SUGAR ONCE A DAY DX E11.51 -, Disp: 100 strip, Rfl: 3   tamsulosin  (FLOMAX ) 0.4 MG CAPS capsule, Take 0.4 mg by mouth in the morning and at bedtime., Disp: , Rfl:    nitroGLYCERIN  (NITROSTAT ) 0.4 MG SL tablet, DISSOLVE 1 TABLET BY MOUTH UNDER THE TONGUE EVERY 5 MINUTES AS  NEEDED FOR CHEST PAIN (Patient not taking: Reported on 07/04/2023), Disp: 75 tablet, Rfl: 5   ondansetron  (ZOFRAN ) 8 MG tablet, Take 1 tablet (8 mg total) by mouth every 8 (eight) hours as needed for nausea or vomiting. (Patient not taking: Reported on 07/04/2023), Disp: 20 tablet, Rfl: 0   oxybutynin (DITROPAN) 5 MG tablet, Take 5 mg by mouth at bedtime. (Patient not taking: Reported on 07/04/2023), Disp: , Rfl:  sildenafil (VIAGRA) 100 MG tablet, Take 100 mg by mouth daily as needed for erectile dysfunction. (Patient not taking: Reported on 07/04/2023), Disp: , Rfl:   Review of Systems:  Negative unless indicated in HPI.   Physical Exam: Vitals:   07/04/23 1600  BP: 120/70  Pulse: 72  Temp: 98.1 F (36.7 C)  TempSrc: Oral  SpO2: 95%  Weight: 194 lb 6.4 oz (88.2 kg)    Body mass index is 30.45 kg/m.   Physical Exam   Impression and Plan:  Type 2 diabetes mellitus without complication, without long-term current use of insulin  (HCC) -     POCT glycosylated hemoglobin (Hb A1C) -     Microalbumin / creatinine urine ratio; Future  Bilateral leg numbness -     Vitamin B12; Future -     Ambulatory referral to Neurology -     MR LUMBAR SPINE WO CONTRAST; Future  Bilateral leg pain -     MR LUMBAR SPINE WO CONTRAST; Future  Malignant neoplasm of prostate (HCC) -     MR LUMBAR SPINE WO CONTRAST; Future   - A1c demonstrates well-controlled diabetes. - Check vitamin B12 to rule out deficiency. - I am concerned about possibly spinal metastases from his prostate cancer as a cause for his leg situation.  Will order an MRI of the lumbar spine.  Time spent:33 minutes reviewing chart, interviewing and examining patient and formulating plan of care.     Marguerita Shih, MD [facility]

## 2023-07-09 ENCOUNTER — Telehealth: Payer: Self-pay

## 2023-07-09 ENCOUNTER — Other Ambulatory Visit: Payer: Self-pay | Admitting: Internal Medicine

## 2023-07-09 ENCOUNTER — Other Ambulatory Visit: Payer: Self-pay

## 2023-07-09 DIAGNOSIS — E119 Type 2 diabetes mellitus without complications: Secondary | ICD-10-CM

## 2023-07-09 MED ORDER — DEXCOM G7 RECEIVER DEVI: 1.0000 | 0 refills | Status: DC

## 2023-07-09 MED ORDER — DEXCOM G7 SENSOR MISC: 1.0000 | 4 refills | Status: DC

## 2023-07-09 NOTE — Telephone Encounter (Signed)
Telephone encounter created for this

## 2023-07-09 NOTE — Telephone Encounter (Signed)
 [facility], pharmacist said Medicare part B only covers on insulin  and his other insurance tells her to file through medicare. Is insulin  something that you think/prefer him to be on for this? Or is there another device we can try?

## 2023-07-09 NOTE — Telephone Encounter (Signed)
 Copied from CRM [id number]. Topic: Clinical - Medication Question >> Jul 09, 2023  9:00 AM Jose [name] wrote: Reason for CRM: Patient stated he never had the Dexcom G7 and he is wanting to know why it's on his chart.

## 2023-07-11 MED ORDER — FREESTYLE LIBRE 3 SENSOR MISC
3 refills | Status: DC
Start: 2023-07-11 — End: 2023-09-10

## 2023-07-11 NOTE — Telephone Encounter (Signed)
 Jose [name] was sent to the pharmacy.

## 2023-07-12 ENCOUNTER — Encounter: Payer: Self-pay | Admitting: Internal Medicine

## 2023-07-12 ENCOUNTER — Ambulatory Visit (HOSPITAL_BASED_OUTPATIENT_CLINIC_OR_DEPARTMENT_OTHER)
Admission: RE | Admit: 2023-07-12 | Discharge: 2023-07-12 | Disposition: A | Discharge status: Home / Self Care | Source: Ambulatory Visit | Attending: Internal Medicine | Admitting: Internal Medicine

## 2023-07-12 DIAGNOSIS — C61 Malignant neoplasm of prostate: Secondary | ICD-10-CM | POA: Insufficient documentation

## 2023-07-12 DIAGNOSIS — R200 Anesthesia of skin: Secondary | ICD-10-CM | POA: Diagnosis present

## 2023-07-12 DIAGNOSIS — M79605 Pain in left leg: Secondary | ICD-10-CM | POA: Insufficient documentation

## 2023-07-12 DIAGNOSIS — M79604 Pain in right leg: Secondary | ICD-10-CM | POA: Insufficient documentation

## 2023-07-16 ENCOUNTER — Other Ambulatory Visit (HOSPITAL_COMMUNITY): Payer: Self-pay

## 2023-07-16 ENCOUNTER — Telehealth: Payer: Self-pay

## 2023-07-16 NOTE — Progress Notes (Signed)
 Patient states that he has had an episode of low glucose of below 50 where he became disoriented.  His wife came to his aid with a glass of orange juice and he felt much better.

## 2023-07-16 NOTE — Telephone Encounter (Signed)
 Per test claim, CGM supplies must be billed to Medicare Part B (medical coverage) as opposed to pt's pharmacy coverage. Our team does not currently handle medical PA's through part B. Thank you.

## 2023-07-16 NOTE — Telephone Encounter (Signed)
 Pharmacy Patient Advocate Encounter   Received notification from Patient Advice Request messages that prior authorization for Freestyle Libre 3 CGM supplies is required/requested.   Insurance verification completed.   The patient is insured through [facility] MEDICARE PART D .   Per test claim: Medication is not eligible for pharmacy benefits and must be billed through medical insurance. As our team only handles pharmacy related prior auths, medical PA's must be submitted by the clinic. Thank you

## 2023-07-17 NOTE — Telephone Encounter (Signed)
 Spoke to the patient and explained that the prescription is now going through [facility].

## 2023-08-04 ENCOUNTER — Other Ambulatory Visit: Payer: Self-pay | Admitting: Internal Medicine

## 2023-08-05 ENCOUNTER — Other Ambulatory Visit: Payer: Self-pay | Admitting: Internal Medicine

## 2023-08-19 ENCOUNTER — Other Ambulatory Visit (HOSPITAL_BASED_OUTPATIENT_CLINIC_OR_DEPARTMENT_OTHER): Payer: Self-pay

## 2023-08-19 ENCOUNTER — Encounter (HOSPITAL_BASED_OUTPATIENT_CLINIC_OR_DEPARTMENT_OTHER): Payer: Self-pay

## 2023-08-19 ENCOUNTER — Ambulatory Visit (HOSPITAL_BASED_OUTPATIENT_CLINIC_OR_DEPARTMENT_OTHER)
Admission: EM | Admit: 2023-08-19 | Discharge: 2023-08-19 | Disposition: A | Discharge status: Home / Self Care | Attending: Family Medicine | Admitting: Family Medicine

## 2023-08-19 DIAGNOSIS — J0110 Acute frontal sinusitis, unspecified: Secondary | ICD-10-CM

## 2023-08-19 LAB — POCT URINALYSIS DIP (MANUAL ENTRY)
Bilirubin, UA: NEGATIVE
Blood, UA: NEGATIVE
Glucose, UA: NEGATIVE mg/dL
Leukocytes, UA: NEGATIVE
Nitrite, UA: NEGATIVE
Protein Ur, POC: 100 mg/dL — AB
Spec Grav, UA: 1.025 (ref 1.010–1.025)
Urobilinogen, UA: 0.2 U/dL
pH, UA: 7.5 (ref 5.0–8.0)

## 2023-08-19 MED ORDER — AMOXICILLIN 875 MG PO TABS
875.0000 mg | ORAL_TABLET | Freq: Two times a day (BID) | ORAL | 0 refills | Status: AC
  Filled 2023-08-19: qty 14, 7d supply, fill #0

## 2023-08-19 NOTE — ED Triage Notes (Signed)
 Patient reports has been having emesis for a long time now.  (Several Months) Has developed sinus congestion that is not related to the vomiting. Vomiting happens after drinking water . After vomiting, patient states pain to low abdomen and low back.  Denies burning with urination. States has not had this evaluated in the past. 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 Patient states his reason for being here today is cough, runny nose and low back pain. Patient does self cath.

## 2023-08-19 NOTE — ED Provider Notes (Signed)
 Jose [name] CARE    CSN: [id number] Arrival date & time: 08/19/23  0836      History   Chief Complaint Chief Complaint  Patient presents with   Cough   Emesis    HPI Jose [name] is a 73 y.o. male.   73 year old male presents with sinus congestion, drainage, cough.  This has been present and worsening over the past few weeks.  He has not really taken anything for his symptoms.  Denies any fevers, chills, body aches.  Also has had some lower back pain and bladder pressure.  Does self cath but per wife does not do this as often as needed.  He is concerned questionable urinary infection.  Denies any burning or other symptoms.    Cough Emesis Associated symptoms: cough     Past Medical History:  Diagnosis Date   ALCOHOL ABUSE 07/27/2008   Allergy     ANXIETY 07/27/2008   Arthritis    Bifascicular block    beta blocker stopped due to profound bradycardia   CAD 07/27/2008   a. s/p Endeavor DES to CFX 07/2008; residual OM1 70%, EF 45%;  b. relook cath 5/10: patent stent in CFX;  c.  LHC (8/15):  prox LAD 30%, OM1 50% at bifurcation, OM1 sub-branch 50%, mid AVCFX stent patent, EF 55-65% - no change from 2010 >>> Med Rx (after abnormal nuc)    Cataract    Diabetes mellitus without complication (HCC)    GERD 09/18/2006   History of TIA (transient ischemic attack) 07/2008   Hx of adenomatous colonic polyps    Hx of cardiovascular stress test    ETT-Myoview (09/2013):  Partially fixed defect with some reversibility - cannot r/o inf ischemia vs diaph atten, EF 51%; mod risk   Hyperlipidemia    HYPERTENSION 09/18/2006   LOW BACK PAIN 09/18/2006   Myocardial infarction (HCC) 2010   Numbness and tingling    finger tips at times   Rosacea 07/27/2008   SEIZURE DISORDER 09/18/2006   none since 2008   Squamous cell carcinoma of skin 07/19/2021   in situ - right nasal sidewall   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   Urinary tract infection start cipro  01-07-2012    Patient Active  Problem List   Diagnosis Date Noted   Hypomagnesemia 03/08/2022   Hypokalemia 03/08/2022   Macrocytic anemia 03/08/2022   Acute renal failure (ARF) (HCC) 02/20/2022   Shock (HCC) 02/20/2022   Lactic acidosis 02/20/2022   Hyperkalemia 02/20/2022   Anaphylactic reaction 10/18/2021   Malignant neoplasm of prostate (HCC) 02/28/2021   Genetic testing 02/28/2021   Diabetes mellitus with peripheral vascular disease (HCC) 11/29/2015   TSH elevation 12/21/2013   Type 2 diabetes mellitus without complication, without long-term current use of insulin  (HCC) 12/21/2013   Elevated PSA 03/11/2012   Right inguinal hernia 11/30/2011   Bradycardia 06/19/2010   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   Dyslipidemia 08/05/2008   Anxiety state 07/27/2008   Alcohol abuse 07/27/2008   Coronary atherosclerosis 07/27/2008   ROSACEA 07/27/2008   FACIAL PARESTHESIA, RIGHT 07/27/2008   History of colonic polyps 01/08/2007   Essential hypertension 09/18/2006   GERD 09/18/2006   LOW BACK PAIN 09/18/2006    Past Surgical History:  Procedure Laterality Date   CORONARY STENT PLACEMENT     drug eluting   GOLD SEED IMPLANT N/A 04/11/2021   Procedure: GOLD SEED IMPLANT SPACE OAR;  Surgeon: Roxane Copp, MD;  Location: [facility];  Service: Urology;  Laterality: N/A;   HERNIA REPAIR     INGUINAL HERNIA REPAIR  01/14/2012   Procedure: LAPAROSCOPIC INGUINAL HERNIA;  Surgeon: Shela Derby, MD;  Location: [facility];  Service: General;  Laterality: Right;   INSERTION OF MESH  01/14/2012   Procedure: INSERTION OF MESH;  Surgeon: Shela Derby, MD;  Location: [facility];  Service: General;  Laterality: Right;   KNEE SURGERY     LEFT HEART CATHETERIZATION WITH CORONARY ANGIOGRAM N/A 10/21/2013   Procedure: LEFT HEART CATHETERIZATION WITH CORONARY ANGIOGRAM;  Surgeon: Arlander Bellman, MD;  Location: [facility];  Service: Cardiovascular;  Laterality: N/A;   MEDIAL  PARTIAL KNEE REPLACEMENT Left        Home Medications    Prior to Admission medications   Medication Sig Start Date End Date Taking? Authorizing Provider  amoxicillin  (AMOXIL ) 875 MG tablet Take 1 tablet (875 mg total) by mouth 2 (two) times daily for 7 days. 08/19/23 08/26/23 Yes Caty Tessler A, FNP  acetaminophen  (TYLENOL ) 500 MG tablet Take 500-1,000 mg by mouth as needed for moderate pain.    [provider]  ALPRAZolam  (XANAX ) 0.5 MG tablet Take 1 tablet (0.5 mg total) by mouth 3 (three) times daily as needed. 09/11/22   Zilphia Hilt, Charyl Coppersmith, MD  amLODipine  (NORVASC ) 5 MG tablet Take 5 mg by mouth daily.    [provider]  aspirin  EC 81 MG tablet Take 81 mg by mouth at bedtime.    [provider]  atorvastatin  (LIPITOR) 20 MG tablet TAKE 1 TABLET BY MOUTH EVERY DAY 02/25/23   Zilphia Hilt, Charyl Coppersmith, MD  Azelaic Acid  15 % cream APPLY 1 APPLICATION TOPICALLY AS DIRECTED. AFTER SKIN IS THOROUGHLY WASHED AND PATTED DRY, GENTLY BUT THOROUGHLY MASSAGE A THIN FILM OF AZELAIC ACID  INTO THE AFFECTED AREA TWICE DAILY, IN THE MORNING AND EVENING. APPLIED TO FACE. 10/22/18   Zilphia Hilt, Charyl Coppersmith, MD  bisoprolol  (ZEBETA ) 10 MG tablet TAKE 1 TABLET BY MOUTH EVERY DAY 10/30/22   Arnoldo Lapping, MD  Continuous Glucose Receiver (DEXCOM G7 RECEIVER) DEVI 1 applicator by Does not apply route as directed. Use as directed to check blood glucose 07/09/23   Zilphia Hilt, Charyl Coppersmith, MD  Continuous Glucose Sensor (FREESTYLE LIBRE 3 SENSOR) MISC Place 1 sensor on the skin every 14 days. Use to check glucose continuously 07/11/23   Zilphia Hilt, Charyl Coppersmith, MD  EPINEPHrine  0.3 mg/0.3 mL IJ SOAJ injection Inject 0.3 mg into the muscle as needed for anaphylaxis. 09/05/21   Trish Furl, MD  glimepiride  (AMARYL ) 2 MG tablet Take 1 tablet (2 mg total) by mouth daily with breakfast. 03/04/23 03/03/24  Zilphia Hilt, Charyl Coppersmith, MD  halobetasol (ULTRAVATE) 0.05 % cream Apply 1  application  topically 2 (two) times daily as needed (dry skin).    [provider]  Lancets [facility] DELICA PLUS LANCET30G) MISC USE AS DIRECTED ONCE DAILY 08/05/23   Zilphia Hilt, Charyl Coppersmith, MD  melatonin 3 MG TABS tablet Take 3 mg by mouth at bedtime.    [provider]  Multiple Vitamins-Minerals (MULTIVITAMIN WITH MINERALS) tablet Take 1 tablet by mouth at bedtime.    [provider]  nitroGLYCERIN  (NITROSTAT ) 0.4 MG SL tablet DISSOLVE 1 TABLET BY MOUTH UNDER THE TONGUE EVERY 5 MINUTES AS NEEDED FOR CHEST PAIN Patient not taking: Reported on 07/04/2023 12/05/21   Zilphia Hilt, Charyl Coppersmith, MD  ondansetron  (ZOFRAN ) 8 MG tablet Take 1 tablet (8 mg total) by  mouth every 8 (eight) hours as needed for nausea or vomiting. Patient not taking: Reported on 07/04/2023 02/25/22   Loma Rising, MD  [facility] VERIO test strip USE TO TEST BLOOD SUGAR ONCE A DAY DX E11.51 - 12/17/22   Zilphia Hilt, Charyl Coppersmith, MD  oxybutynin (DITROPAN) 5 MG tablet Take 5 mg by mouth at bedtime. Patient not taking: Reported on 07/04/2023 02/18/22   [provider]  sildenafil (VIAGRA) 100 MG tablet Take 100 mg by mouth daily as needed for erectile dysfunction. Patient not taking: Reported on 07/04/2023    [provider]  tamsulosin  (FLOMAX ) 0.4 MG CAPS capsule Take 0.4 mg by mouth in the morning and at bedtime. 05/26/21   [provider]    Family History Family History  Problem Relation Age of Onset   Lung cancer Mother    Hypertension Mother    Diabetes Mother    Hyperlipidemia Mother    Emphysema Father    Heart failure Father    Melanoma Sister    Melanoma Paternal Grandmother    Heart attack Other    Colon cancer Neg Hx    Stroke Neg Hx    Esophageal cancer Neg Hx    Liver cancer Neg Hx    Pancreatic cancer Neg Hx    Stomach cancer Neg Hx    Rectal cancer Neg Hx     Social History Social History   Tobacco Use   Smoking status: Never   Smokeless  tobacco: Former    Types: Chew    Quit date: 02/03/2012  Vaping Use   Vaping status: Never Used  Substance Use Topics   Alcohol use: No    Comment: past drinker 2-3 daily and sometimes binge drinks on the weekend   Drug use: No     Allergies   Patient has no known allergies.   Review of Systems Review of Systems  Respiratory:  Positive for cough.   Gastrointestinal:  Positive for vomiting.    See HPI Physical Exam Triage Vital Signs ED Triage Vitals  Encounter Vitals Group     BP 08/19/23 0851 (!) 161/87     Girls Systolic BP Percentile --      Girls Diastolic BP Percentile --      Boys Systolic BP Percentile --      Boys Diastolic BP Percentile --      Pulse Rate 08/19/23 0851 93     Resp 08/19/23 0851 (!) 96     Temp 08/19/23 0851 98.5 F (36.9 C)     Temp Source 08/19/23 0851 Oral     SpO2 08/19/23 0851 96 %     Weight --      Height --      Head Circumference --      Peak Flow --      Pain Score 08/19/23 0855 7     Pain Loc --      Pain Education --      Exclude from Growth Chart --    No data found.  Updated Vital Signs BP (!) 161/87 (BP Location: Right Arm)   Pulse 93   Temp 98.5 F (36.9 C) (Oral)   Resp (!) 96   SpO2 96%   Visual Acuity Right Eye Distance:   Left Eye Distance:   Bilateral Distance:    Right Eye Near:   Left Eye Near:    Bilateral Near:     Physical Exam Constitutional:      General:  He is not in acute distress.    Appearance: Normal appearance. He is not ill-appearing, toxic-appearing or diaphoretic.  HENT:     Right Ear: Tympanic membrane, ear canal and external ear normal.     Left Ear: Tympanic membrane, ear canal and external ear normal.     Nose: Congestion and rhinorrhea present.     Mouth/Throat:     Pharynx: Oropharynx is clear.   Eyes:     Conjunctiva/sclera: Conjunctivae normal.    Cardiovascular:     Rate and Rhythm: Normal rate and regular rhythm.     Pulses: Normal pulses.     Heart sounds: Normal  heart sounds.  Pulmonary:     Effort: Pulmonary effort is normal.     Breath sounds: Normal breath sounds.   Musculoskeletal:        General: Normal range of motion.   Skin:    General: Skin is warm and dry.   Neurological:     Mental Status: He is alert.   Psychiatric:        Mood and Affect: Mood normal.      UC Treatments / Results  Labs (all labs ordered are listed, but only abnormal results are displayed) Labs Reviewed  POCT URINALYSIS DIP (MANUAL ENTRY) - Abnormal; Notable for the following components:      Result Value   Ketones, POC UA small (15) (*)    Protein Ur, POC =100 (*)    All other components within normal limits    EKG   Radiology No results found.  Procedures Procedures (including critical care time)  Medications Ordered in UC Medications - No data to display  Initial Impression / Assessment and Plan / UC Course  I have reviewed the triage vital signs and the nursing notes.  Pertinent labs & imaging results that were available during my care of the patient were reviewed by me and considered in my medical decision making (see chart for details).     Acute sinusitis-most likely patient has a bacterial sinus infection.  Will treat with antibiotics.  Recommend plain Mucinex over-the-counter for congestion. Urine with any concerns of infection.  Recommend to self cath more often to avoid the bladder pressure and back pain. Patient and wife agree.  Follow-up as needed Final Clinical Impressions(s) / UC Diagnoses   Final diagnoses:  Acute non-recurrent frontal sinusitis     Discharge Instructions      Treating you for a stye infection.  Take the antibiotics as prescribed.  Recommend plain Mucinex without any additives over-the-counter for congestion No concern for urinary tract infection on testing  follow-up as needed   ED Prescriptions     Medication Sig Dispense Auth. Provider   amoxicillin  (AMOXIL ) 875 MG tablet Take 1 tablet (875  mg total) by mouth 2 (two) times daily for 7 days. 14 tablet Landa Pine, FNP      PDMP not reviewed this encounter.   Landa Pine, FNP 08/19/23 [phone]

## 2023-08-19 NOTE — Discharge Instructions (Signed)
 Treating you for a stye infection.  Take the antibiotics as prescribed.  Recommend plain Mucinex without any additives over-the-counter for congestion No concern for urinary tract infection on testing  follow-up as needed

## 2023-08-26 ENCOUNTER — Ambulatory Visit: Payer: Self-pay | Admitting: Internal Medicine

## 2023-09-10 ENCOUNTER — Telehealth: Payer: Self-pay | Admitting: Internal Medicine

## 2023-09-10 DIAGNOSIS — E119 Type 2 diabetes mellitus without complications: Secondary | ICD-10-CM

## 2023-09-10 MED ORDER — FREESTYLE LIBRE 3 SENSOR MISC: 3 refills | Status: DC

## 2023-09-10 NOTE — Telephone Encounter (Signed)
 Copied from CRM [id number]. Topic: Clinical - Medication Refill >> Sep 10, 2023 12:34 PM Suzen RAMAN wrote: Medication: Continuous Glucose Sensor (FREESTYLE LIBRE 3 SENSOR) MISC  Has the patient contacted their pharmacy? Yes  This is the patient's preferred pharmacy:  [id number] - [place], [place] - [address] [facility] [place] [address] Phone: [phone] Fax: [phone]  Is this the correct pharmacy for this prescription? Yes If no, delete pharmacy and type the correct one.   Has the prescription been filled recently? No  Is the patient out of the medication? Yes  Has the patient been seen for an appointment in the last year OR does the patient have an upcoming appointment? Yes  Can we respond through MyChart? No  Agent: Please be advised that Rx refills may take up to 3 business days. We ask that you follow-up with your pharmacy.

## 2023-09-13 ENCOUNTER — Ambulatory Visit (INDEPENDENT_AMBULATORY_CARE_PROVIDER_SITE_OTHER): Admitting: Neurology

## 2023-09-13 ENCOUNTER — Telehealth: Payer: Self-pay | Admitting: Diagnostic Neuroimaging

## 2023-09-13 ENCOUNTER — Encounter: Payer: Self-pay | Admitting: Neurology

## 2023-09-13 VITALS — BP 154/70 | HR 56 | Ht 66.0 in | Wt 189.0 lb

## 2023-09-13 DIAGNOSIS — R29898 Other symptoms and signs involving the musculoskeletal system: Secondary | ICD-10-CM | POA: Diagnosis not present

## 2023-09-13 DIAGNOSIS — M6240 Contracture of muscle, unspecified site: Secondary | ICD-10-CM

## 2023-09-13 DIAGNOSIS — R200 Anesthesia of skin: Secondary | ICD-10-CM | POA: Diagnosis not present

## 2023-09-13 DIAGNOSIS — R251 Tremor, unspecified: Secondary | ICD-10-CM

## 2023-09-13 DIAGNOSIS — M79604 Pain in right leg: Secondary | ICD-10-CM | POA: Diagnosis not present

## 2023-09-13 DIAGNOSIS — M79605 Pain in left leg: Secondary | ICD-10-CM

## 2023-09-13 DIAGNOSIS — R7309 Other abnormal glucose: Secondary | ICD-10-CM

## 2023-09-13 DIAGNOSIS — R413 Other amnesia: Secondary | ICD-10-CM

## 2023-09-13 MED ORDER — PREGABALIN 50 MG PO CAPS: ORAL_CAPSULE | ORAL | 3 refills | Status: DC

## 2023-09-13 NOTE — Patient Instructions (Signed)
 MRI pelvis Blood work today Emg/ncs(will schedule first available but likely move you sooner after we get the MRI results) Start Lyrics  Pregabalin  Capsules What is this medication? PREGABALIN  (pre GAB a lin) treats nerve pain. It may also be used to prevent and control seizures in people with epilepsy. It works by calming overactive nerves in your body. This medicine may be used for other purposes; ask your health care provider or pharmacist if you have questions. COMMON BRAND NAME(S): Lyrica  What should I tell my care team before I take this medication? They need to know if you have any of these conditions: Heart failure Kidney disease Lung disease Substance use disorder Suicidal thoughts, plans or attempt by you or a family member An unusual or allergic reaction to pregabalin , other medications, foods, dyes, or preservatives Pregnant or trying to get pregnant Breast-feeding How should I use this medication? Take this medication by mouth with water . Take it as directed on the prescription label at the same time every day. You can take it with or without food. If it upsets your stomach, take it with food. Keep taking it unless your care team tells you to stop. A special MedGuide will be given to you by the pharmacist with each prescription and refill. Be sure to read this information carefully each time. Talk to your care team about the use of this medication in children. While it may be prescribed for children as young as 1 month for selected conditions, precautions do apply. Overdosage: If you think you have taken too much of this medicine contact a poison control center or emergency room at once. NOTE: This medicine is only for you. Do not share this medicine with others. What if I miss a dose? If you miss a dose, take it as soon as you can. If it is almost time for your next dose, take only that dose. Do not take double or extra doses. What may interact with this medication? This  medication may interact with the following: Alcohol Antihistamines for allergy , cough, and cold Certain medications for anxiety or sleep Certain medications for blood pressure, heart disease Certain medications for depression like amitriptyline, fluoxetine, sertraline Certain medications for diabetes, like pioglitazone, rosiglitazone Certain medications for seizures like phenobarbital, primidone General anesthetics like halothane, isoflurane, methoxyflurane, propofol  Medications that relax muscles for surgery Opioid medications for pain Phenothiazines like chlorpromazine, mesoridazine, prochlorperazine , thioridazine This list may not describe all possible interactions. Give your health care provider a list of all the medicines, herbs, non-prescription drugs, or dietary supplements you use. Also tell them if you smoke, drink alcohol, or use illegal drugs. Some items may interact with your medicine. What should I watch for while using this medication? Visit your care team for regular checks on your progress. Tell your care team if your symptoms do not start to get better or if they get worse. Do not suddenly stop taking this medication. You may develop a severe reaction. Your care team will tell you how much medication to take. If your care team wants you to stop the medication, the dose may be slowly lowered over time to avoid any side effects. This medication may affect your coordination, reaction time, or judgment. Do not drive or operate machinery until you know how this medication affects you. Sit up or stand slowly to reduce the risk of dizzy or fainting spells. Drinking alcohol with this medication can increase the risk of these side effects. If you or your family notice any changes in  your behavior, such as new or worsening depression, thoughts of harming yourself, anxiety, other unusual or disturbing thoughts, or memory loss, call your care team right away. Wear a medical ID bracelet or  chain if you are taking this medication for seizures. Carry a card that describes your condition. List the medications and doses you take on the card. This medication may make it more difficult to father a child. Talk to your care team if you are concerned about your fertility. What side effects may I notice from receiving this medication? Side effects that you should report to your care team as soon as possible: Allergic reactions or angioedema--skin rash, itching, hives, swelling of the face, eyes, lips, tongue, arms, or legs, trouble swallowing or breathing Blurry vision Thoughts of suicide or self-harm, worsening mood, feelings of depression Trouble breathing Side effects that usually do not require medical attention (report to your care team if they continue or are bothersome): Dizziness Drowsiness Dry mouth Nausea Swelling of the ankles, feet, hands Vomiting Weight gain This list may not describe all possible side effects. Call your doctor for medical advice about side effects. You may report side effects to FDA at 1-800-FDA-1088. Where should I keep my medication? Keep out of the reach of children and pets. This medication can be abused. Keep it in a safe place to protect it from theft. Do not share it with anyone. It is only for you. Selling or giving away this medication is dangerous and against the law. Store at [employer] C (77 degrees F). Get rid of any unused medication after the expiration date. This medication may cause harm and death if it is taken by other adults, children, or pets. It is important to get rid of the medication as soon as you no longer need it, or it is expired. You can do this in two ways: Take the medication to a medication take-back program. Check with your pharmacy or law enforcement to find a location. If you cannot return the medication, check the label or package insert to see if the medication should be thrown out in the garbage or flushed down the  toilet. If you are not sure, ask your care team. If it is safe to put it in the trash, take the medication out of the container. Mix the medication with cat litter, dirt, coffee grounds, or other unwanted substance. Seal the mixture in a bag or container. Put it in the trash. NOTE: This sheet is a summary. It may not cover all possible information. If you have questions about this medicine, talk to your doctor, pharmacist, or health care provider.  2024 Elsevier/Gold Standard (2020-11-22 00:00:00)Electromyoneurogram Electromyoneurogram is a test to check how well your muscles and nerves are working. This procedure includes the combined use of electromyogram (EMG) and nerve conduction study (NCS). EMG is used to evaluate muscles and the nerves that control those muscles. NCS, which is also called electroneurogram, measures how well your nerves conduct electricity. The procedures should be done together to check if your muscles and nerves are healthy. If the results of the tests are abnormal, this may indicate disease or injury, such as a neuromuscular disease or peripheral nerve damage. Tell a health care provider about: Any allergies you have. All medicines you are taking, including vitamins, herbs, eye drops, creams, and over-the-counter medicines. Any bleeding problems you have. Any surgeries you have had. Any medical conditions you have. What are the risks? Generally, this is a safe procedure. However, problems may  occur, including: Bleeding or bruising. Infection where the electrodes were inserted. What happens before the test? Medicines Take all of your usually prescribed medications before this testing is performed. Do not stop your blood thinners unless advised by your prescribing physician. General instructions Your health care provider may ask you to warm the limb that will be checked with warm water , hot pack, or wrapping the limb in a blanket. Do not use lotions or creams on the same  day that you will be having the procedure. What happens during the test? For EMG  Your health care provider will ask you to stay in a position so that the muscle being studied can be accessed. You will be sitting or lying down. You may be given a medicine to numb the area (local anesthetic) and the skin will be disinfected. A very thin needle that has an electrode will be inserted into your muscle, one muscle at a time. Typically, multiple muscles are evaluated during a single study. Another small electrode will be placed on your skin near the muscle. Your health care provider will ask you to continue to remain still. The electrodes will record the electrical activity of your muscles. You may see this on a monitor or hear it in the room. After your muscles have been studied at rest, your health care provider will ask you to contract or flex your muscles. The electrodes will record the electrical activity of your muscles. Your health care provider will remove the electrodes and the electrode needle when the procedure is finished. The procedure may vary among health care providers and hospitals. For NCS  An electrode that records your nerve activity (recording electrode) will be placed on your skin by the muscle that is being studied. An electrode that is used as a reference (reference electrode) will be placed near the recording electrode. A paste or gel will be applied to your skin between the recording electrode and the reference electrode. Your nerve will be stimulated with a mild shock. The speed of the nerves and strength of response is recorded by the electrodes. Your health care provider will remove the electrodes and the gel when the procedure is finished. The procedure may vary among health care providers and hospitals. What can I expect after the test? It is up to you to get your test results. Ask your health care provider, or the department that is doing the test, when your results  will be ready. Your health care provider may: Give you medicines for any pain. Monitor the insertion sites to make sure that bleeding stops. You should be able to drive yourself to and from the test. Discomfort can persist for a few hours after the test, but should be better the next day. Contact a health care provider if: You have swelling, redness, or drainage at any of the insertion sites. Summary Electromyoneurogram is a test to check how well your muscles and nerves are working. If the results of the tests are abnormal, this may indicate disease or injury. This is a safe procedure. However, problems may occur, such as bleeding and infection. Your health care provider will do two tests to complete this procedure. One checks your muscles (EMG) and another checks your nerves (NCS). It is up to you to get your test results. Ask your health care provider, or the department that is doing the test, when your results will be ready. This information is not intended to replace advice given to you by your health care provider.  Make sure you discuss any questions you have with your health care provider. Document Revised: 11/02/2020 Document Reviewed: 10/02/2020 Elsevier Patient Education  2024 [employer].

## 2023-09-13 NOTE — Telephone Encounter (Signed)
 I called pharmacy back. They needed ICD10 code for the preabalin. VRP

## 2023-09-13 NOTE — Progress Notes (Unsigned)
 [facility]    Provider:  Dr [name] Requesting Provider: Theophilus [name], [name] Primary Care Provider:  Theophilus [name], Jose GRADE, MD  CC:  bilateral leg numbness  HPI:  Jose [name] is a 73 y.o. male here as requested by Jose [name], [name] for bilateral leg numbness. has Dyslipidemia; Anxiety state; Alcohol abuse; Essential hypertension; Coronary atherosclerosis; Right bundle branch block with left anterior fascicular block; GERD; ROSACEA; LOW BACK PAIN; FACIAL PARESTHESIA, RIGHT; History of colonic polyps; Bradycardia; Right inguinal hernia; Elevated PSA; TSH elevation; Type 2 diabetes mellitus without complication, without long-term current use of insulin  (HCC); Diabetes mellitus with peripheral vascular disease (HCC); Malignant neoplasm of prostate (HCC); Genetic testing; Anaphylactic reaction; Acute renal failure (ARF) (HCC); Shock (HCC); Lactic acidosis; Hyperkalemia; Hypomagnesemia; Hypokalemia; and Macrocytic anemia on their problem list.  I reviewed Jose. Theophilus [name] notes from Jul 04, 2023, patient history significant for prostate cancer who developed radiation cystitis and completed over 40 treatments of hyperbaric oxygen in December and since then he has had a history of progressing bilateral leg numbness and pain, starts from the waist down pins and needle sensation in his inguinal crease bilaterally all the way down to his toes pain as well like electrical shocks, has had several falls at home.  Past medical history is also significant for alcohol abuse, diabetes, history of TIA, MI, numbness and tingling of the fingertips.  He is here with his wife who provides information. He has twitching, cramps, toes going numb, maybe in the hands. Started about a year ago, radiation went on for several months, started after the radiation about 6 months after the radiation, he noticed the cramping in his legs and then the burning in the toes and he thought it may be  his diabetes, the symptoms continue to get worse, started in both legs at the same time, legs are symmetrical. Hands in the last few months tingling in the fingers, no neck pain, his legs are weak and he falls a lot, he has low back pain every so often, it is throbbing, he screams at night and does not get a lot of sleep. He was on gabapentin for many years they do not remember for what, he would fall a lot on the gabapentin, made his legs weak, he is not sure why he was on the gabapentin he states he may have had seizures or mini-strokes.(I see gabapentin from 2008-2012 in his chart per review) may have been on it since 2000 even.   Reviewed notes, labs and imaging from outside physicians, which showed:  MRi lumbar spine 08/11/2023: MRI LUMBAR SPINE WITHOUT CONTRAST   TECHNIQUE: Multiplanar, multisequence MR imaging of the lumbar spine was performed. No intravenous contrast was administered.   COMPARISON:  None Available.   FINDINGS: Segmentation:  Standard.   Alignment:  Physiologic.   Vertebrae:  L1 hemangioma.  No acute abnormality.   Conus medullaris and cauda equina: Conus extends to the L1 level. Conus and cauda equina appear normal.   Paraspinal and other soft tissues: Negative   Disc levels:   L1-L2: Small disc bulge. Mild spinal canal stenosis. No neural foraminal stenosis.   L2-L3: Normal disc space and facet joints. No spinal canal stenosis. No neural foraminal stenosis.   L3-L4: Normal disc space and facet joints. No spinal canal stenosis. No neural foraminal stenosis.   L4-L5: Normal disc space and facet joints. No spinal canal stenosis. No neural foraminal stenosis.   L5-S1: Normal disc space and facet joints. No  spinal canal stenosis. No neural foraminal stenosis.   Visualized sacrum: Normal.   IMPRESSION: Mild spinal canal stenosis at L1-L2 due to small disc bulge. Otherwise normal lumbar spine.  07/04/2023: B12 613 nml, hgba1c 5.1 nml,  06/06/2022 hgba1c  5.4   Review of Systems: Patient complains of symptoms per HPI as well as the following symptoms ***. Pertinent negatives and positives per HPI. All others negative.   Social History   Socioeconomic History   Marital status: Married    Spouse name: Not on file   Number of children: 2   Years of education: Not on file   Highest education level: Not on file  Occupational History   Occupation: Chief Executive Officer    Employer: [employer]  Tobacco Use   Smoking status: Never   Smokeless tobacco: Former    Types: Chew    Quit date: 02/03/2012  Vaping Use   Vaping status: Never Used  Substance and Sexual Activity   Alcohol use: No    Comment: past drinker 2-3 daily and sometimes binge drinks on the weekend   Drug use: No   Sexual activity: Yes  Other Topics Concern   Not on file  Social History Narrative   Not on file   Social Drivers of Health   Financial Resource Strain: Low Risk  (06/04/2022)   Overall Financial Resource Strain (CARDIA)    Difficulty of Paying Living Expenses: Not hard at all  Food Insecurity: No Food Insecurity (06/04/2022)   Hunger Vital Sign    Worried About Running Out of Food in the Last Year: Never true    Ran Out of Food in the Last Year: Never true  Transportation Needs: No Transportation Needs (06/04/2022)   PRAPARE - Administrator, Civil Service (Medical): No    Lack of Transportation (Non-Medical): No  Physical Activity: Insufficiently Active (06/04/2022)   Exercise Vital Sign    Days of Exercise per Week: 3 days    Minutes of Exercise per Session: 30 min  Stress: No Stress Concern Present (06/04/2022)   [employer] of Occupational Health - Occupational Stress Questionnaire    Feeling of Stress : Only a little  Social Connections: Socially Integrated (06/04/2022)   Social Connection and Isolation Panel    Frequency of Communication with Friends and Family: More than three times a week    Frequency of  Social Gatherings with Friends and Family: More than three times a week    Attends Religious Services: More than 4 times per year    Active Member of [employer] or Organizations: Yes    Attends Engineer, structural: More than 4 times per year    Marital Status: Married  Catering manager Violence: Not At Risk (06/04/2022)   Humiliation, Afraid, Rape, and Kick questionnaire    Fear of Current or Ex-Partner: No    Emotionally Abused: No    Physically Abused: No    Sexually Abused: No    Family History  Problem Relation Age of Onset   Lung cancer Mother    Hypertension Mother    Diabetes Mother    Hyperlipidemia Mother    Emphysema Father    Heart failure Father    Melanoma Sister    Melanoma Paternal Grandmother    Heart attack Other    Colon cancer Neg Hx    Stroke Neg Hx    Esophageal cancer Neg Hx    Liver cancer Neg Hx    Pancreatic cancer Neg Hx  Stomach cancer Neg Hx    Rectal cancer Neg Hx     Past Medical History:  Diagnosis Date   ALCOHOL ABUSE 07/27/2008   Allergy     ANXIETY 07/27/2008   Arthritis    Bifascicular block    beta blocker stopped due to profound bradycardia   CAD 07/27/2008   a. s/p Endeavor DES to CFX 07/2008; residual OM1 70%, EF 45%;  b. relook cath 5/10: patent stent in CFX;  c.  LHC (8/15):  prox LAD 30%, OM1 50% at bifurcation, OM1 sub-branch 50%, mid AVCFX stent patent, EF 55-65% - no change from 2010 >>> Med Rx (after abnormal nuc)    Cataract    Diabetes mellitus without complication (HCC)    GERD 09/18/2006   History of TIA (transient ischemic attack) 07/2008   Hx of adenomatous colonic polyps    Hx of cardiovascular stress test    ETT-Myoview (09/2013):  Partially fixed defect with some reversibility - cannot r/o inf ischemia vs diaph atten, EF 51%; mod risk   Hyperlipidemia    HYPERTENSION 09/18/2006   LOW BACK PAIN 09/18/2006   Myocardial infarction (HCC) 2010   Numbness and tingling    finger tips at times   Rosacea  07/27/2008   SEIZURE DISORDER 09/18/2006   none since 2008   Squamous cell carcinoma of skin 07/19/2021   in situ - right nasal sidewall   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   Urinary tract infection start cipro  01-07-2012    Patient Active Problem List   Diagnosis Date Noted   Hypomagnesemia 03/08/2022   Hypokalemia 03/08/2022   Macrocytic anemia 03/08/2022   Acute renal failure (ARF) (HCC) 02/20/2022   Shock (HCC) 02/20/2022   Lactic acidosis 02/20/2022   Hyperkalemia 02/20/2022   Anaphylactic reaction 10/18/2021   Malignant neoplasm of prostate (HCC) 02/28/2021   Genetic testing 02/28/2021   Diabetes mellitus with peripheral vascular disease (HCC) 11/29/2015   TSH elevation 12/21/2013   Type 2 diabetes mellitus without complication, without long-term current use of insulin  (HCC) 12/21/2013   Elevated PSA 03/11/2012   Right inguinal hernia 11/30/2011   Bradycardia 06/19/2010   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   Dyslipidemia 08/05/2008   Anxiety state 07/27/2008   Alcohol abuse 07/27/2008   Coronary atherosclerosis 07/27/2008   ROSACEA 07/27/2008   FACIAL PARESTHESIA, RIGHT 07/27/2008   History of colonic polyps 01/08/2007   Essential hypertension 09/18/2006   GERD 09/18/2006   LOW BACK PAIN 09/18/2006    Past Surgical History:  Procedure Laterality Date   CORONARY STENT PLACEMENT     drug eluting   GOLD SEED IMPLANT N/A 04/11/2021   Procedure: GOLD SEED IMPLANT SPACE OAR;  Surgeon: Elisabeth Valli BIRCH, MD;  Location: [facility] [place];  Service: Urology;  Laterality: N/A;   HERNIA REPAIR     INGUINAL HERNIA REPAIR  01/14/2012   Procedure: LAPAROSCOPIC INGUINAL HERNIA;  Surgeon: Lynda Leos, MD;  Location: [facility];  Service: General;  Laterality: Right;   INSERTION OF MESH  01/14/2012   Procedure: INSERTION OF MESH;  Surgeon: Lynda Leos, MD;  Location: [facility];  Service: General;  Laterality: Right;   KNEE SURGERY      LEFT HEART CATHETERIZATION WITH CORONARY ANGIOGRAM N/A 10/21/2013   Procedure: LEFT HEART CATHETERIZATION WITH CORONARY ANGIOGRAM;  Surgeon: Ozell BIRCH Fell, MD;  Location[facility] C[facility];  Service: Cardiovascular;  Laterality: N/A;   MEDIAL PARTIAL KNEE REPLACEMENT Left     Current Outpatient Medications  Medication Sig Dispense Refill   acetaminophen  (TYLENOL ) 500 MG tablet Take 500-1,000 mg by mouth as needed for moderate pain.     ALPRAZolam  (XANAX ) 0.5 MG tablet Take 1 tablet (0.5 mg total) by mouth 3 (three) times daily as needed. 60 tablet 0   amLODipine  (NORVASC ) 5 MG tablet Take 5 mg by mouth daily.     aspirin  EC 81 MG tablet Take 81 mg by mouth at bedtime.     atorvastatin  (LIPITOR) 20 MG tablet Take 20 mg by mouth daily.     Azelaic Acid  15 % cream APPLY 1 APPLICATION TOPICALLY AS DIRECTED. AFTER SKIN IS THOROUGHLY WASHED AND PATTED DRY, GENTLY BUT THOROUGHLY MASSAGE A THIN FILM OF AZELAIC ACID  INTO THE AFFECTED AREA TWICE DAILY, IN THE MORNING AND EVENING. APPLIED TO FACE. 50 g 0   bisoprolol  (ZEBETA ) 10 MG tablet TAKE 1 TABLET BY MOUTH EVERY DAY 90 tablet 3   Continuous Glucose Receiver (DEXCOM G7 RECEIVER) DEVI 1 applicator by Does not apply route as directed. Use as directed to check blood glucose 1 each 0   Continuous Glucose Sensor (FREESTYLE LIBRE 3 SENSOR) MISC Place 1 sensor on the skin every 14 days. Use to check glucose continuously 2 each 3   EPINEPHrine  0.3 mg/0.3 mL IJ SOAJ injection Inject 0.3 mg into the muscle as needed for anaphylaxis. 1 each 0   glimepiride  (AMARYL ) 2 MG tablet Take 1 tablet (2 mg total) by mouth daily with breakfast. 90 tablet 2   halobetasol (ULTRAVATE) 0.05 % cream Apply 1 application  topically 2 (two) times daily as needed (dry skin).     Lancets (ONETOUCH DELICA PLUS LANCET30G) MISC USE AS DIRECTED ONCE DAILY 100 each 0   melatonin 3 MG TABS tablet Take 3 mg by mouth at bedtime.     Multiple Vitamins-Minerals (MULTIVITAMIN WITH MINERALS) tablet  Take 1 tablet by mouth at bedtime.     nitroGLYCERIN  (NITROSTAT ) 0.4 MG SL tablet DISSOLVE 1 TABLET BY MOUTH UNDER THE TONGUE EVERY 5 MINUTES AS NEEDED FOR CHEST PAIN 75 tablet 5   ondansetron  (ZOFRAN ) 8 MG tablet Take 1 tablet (8 mg total) by mouth every 8 (eight) hours as needed for nausea or vomiting. 20 tablet 0   ONETOUCH VERIO test strip USE TO TEST BLOOD SUGAR ONCE A DAY DX E11.51 - 100 strip 3   oxybutynin (DITROPAN) 5 MG tablet Take 5 mg by mouth at bedtime.     pregabalin  (LYRICA ) 50 MG capsule Start with one tablet twice daily for 1 week and can increase to 3x daily(if no side effects) 90 capsule 3   sildenafil (VIAGRA) 100 MG tablet Take 100 mg by mouth daily as needed for erectile dysfunction.     tamsulosin  (FLOMAX ) 0.4 MG CAPS capsule Take 0.4 mg by mouth in the morning and at bedtime.     No current facility-administered medications for this visit.    Allergies as of 09/13/2023   (No Known Allergies)    Vitals: BP (!) 154/70   Pulse (!) 56   Ht 5' 6 (1.676 m)   Wt 189 lb (85.7 kg)   SpO2 95%   BMI 30.51 kg/m  Last Weight:  Wt Readings from Last 1 Encounters:  09/13/23 189 lb (85.7 kg)   Last Height:   Ht Readings from Last 1 Encounters:  09/13/23 5' 6 (1.676 m)     Physical exam: Exam: Gen: NAD, conversant, well nourised, obese, well groomed  CV: RRR, no MRG. No Carotid Bruits. No peripheral edema, warm, nontender Eyes: Conjunctivae clear without exudates or hemorrhage  Neuro: Detailed Neurologic Exam  Speech:    Speech is normal; fluent and spontaneous with normal comprehension.  Cognition:    The patient is oriented to person, place, and time;     recent and remote memory intact;     language fluent;     normal attention, concentration,     fund of knowledge Cranial Nerves:    The pupils are equal, round, and reactive to light. The fundi are normal and spontaneous venous pulsations are present. Visual fields are full to finger  confrontation. Extraocular movements are intact. Trigeminal sensation is intact and the muscles of mastication are normal. The face is symmetric. The palate elevates in the midline. Hearing impaired(not wearing his hearing aids). Voice is normal. Shoulder shrug is normal. The tongue has normal motion without fasciculations.   Coordination:    Normal finger to nose and heel to shin. Normal rapid alternating movements.   Gait:    Heel-toe and tandem gait are abnormal, difficuty walking in heels and toes(unclear if weakness or imbalance) and cannot tandem, wide based, difficulty getting out of a chair needs to use hands. .   Motor Observation:    Postural tremor in the hands. Myokymia  vs fasciculations(appears to be larger motor group than fascic) in the legs mostly upper legs but also in the lower legs bilaterally, none noticed in the arms.  Tone:    Normal muscle tone.    Posture:    Posture is normal. normal erect    Strength: 3/5 proximal legs otherwise strength is V/V in the upper and lower limbs.      Sensation: states he cannot feel pinprick in any of the dermatomes of the legs, thorax,abdomen and only gets sharp at the forehead. States he does not feel vibration      Reflex Exam:  DTR's: 1+ uppers, 2} left patellar, 1+ right patellar, absent AJs Toes:    The toes are equiv bilaterally.   Clonus:    Clonus is absent.    Assessment/Plan:  Radiation-induced lumbosacral plexopathy (RILP) is a rare but potentially severe complication of radiation therapy for prostate cancer. It involves damage to the nerves in the lumbosacral plexus, which can cause a range of symptoms including pain, weakness, numbness, and bowel or bladder dysfunction. Diagnosis can be challenging as it requires ruling out other potential causes like cancer recurrence.   MRI of the lumbosacral plexus for radiation induced damage Emg/ncs  evaluate for plexopathy/radiation induced vs malignancy(hx of prostate cancer  and radiation)     Orders Placed This Encounter  Procedures   MR PELVIS W WO CONTRAST   TSH Rfx on Abnormal to Free T4   Vitamin B1   Vitamin B6   CBC with Differential/Platelets   Comprehensive metabolic panel with GFR   Hemoglobin A1c   Multiple Myeloma Panel (SPEP&IFE w/QIG)   CK   Anti-Hu, Anti-Ri, Anti-Yo IFA   Sedimentation rate   B12 and Folate Panel   Heavy metals, blood   Methylmalonic acid, serum   NCV with EMG(electromyography)   Meds ordered this encounter  Medications   pregabalin  (LYRICA ) 50 MG capsule    Sig: Start with one tablet twice daily for 1 week and can increase to 3x daily(if no side effects)    Dispense:  90 capsule    Refill:  3    Cc: Jose [name], [name],  [place]  Delma Jose GRADE, MD  Onetha Epp, MD  [facility] [address] [place], [place] [address]  Phone [phone] Fax [phone]

## 2023-09-16 ENCOUNTER — Telehealth: Payer: Self-pay | Admitting: Neurology

## 2023-09-16 NOTE — Telephone Encounter (Signed)
 no auth required sent to [facility] [phone]

## 2023-09-18 ENCOUNTER — Ambulatory Visit: Payer: Self-pay | Admitting: Neurology

## 2023-09-18 LAB — HEMOGLOBIN A1C
Est. average glucose Bld gHb Est-mCnc: 108 mg/dL
Hgb A1c MFr Bld: 5.4 % (ref 4.8–5.6)

## 2023-09-18 LAB — CBC WITH DIFFERENTIAL/PLATELET
Basophils Absolute: 0 x10E3/uL (ref 0.0–0.2)
Basos: 1 %
EOS (ABSOLUTE): 0.2 x10E3/uL (ref 0.0–0.4)
Eos: 4 %
Hematocrit: 44.7 % (ref 37.5–51.0)
Hemoglobin: 14.4 g/dL (ref 13.0–17.7)
Immature Grans (Abs): 0 x10E3/uL (ref 0.0–0.1)
Immature Granulocytes: 0 %
Lymphocytes Absolute: 1.6 x10E3/uL (ref 0.7–3.1)
Lymphs: 38 %
MCH: 35 pg — ABNORMAL HIGH (ref 26.6–33.0)
MCHC: 32.2 g/dL (ref 31.5–35.7)
MCV: 109 fL — ABNORMAL HIGH (ref 79–97)
Monocytes Absolute: 0.4 x10E3/uL (ref 0.1–0.9)
Monocytes: 10 %
Neutrophils Absolute: 1.9 x10E3/uL (ref 1.4–7.0)
Neutrophils: 47 %
Platelets: 113 x10E3/uL — ABNORMAL LOW (ref 150–450)
RBC: 4.12 x10E6/uL — ABNORMAL LOW (ref 4.14–5.80)
RDW: 12.8 % (ref 11.6–15.4)
WBC: 4.1 x10E3/uL (ref 3.4–10.8)

## 2023-09-18 LAB — MULTIPLE MYELOMA PANEL, SERUM
Albumin SerPl Elph-Mcnc: 3.5 g/dL (ref 2.9–4.4)
Albumin/Glob SerPl: 1.1 (ref 0.7–1.7)
Alpha 1: 0.2 g/dL (ref 0.0–0.4)
Alpha2 Glob SerPl Elph-Mcnc: 0.8 g/dL (ref 0.4–1.0)
B-Globulin SerPl Elph-Mcnc: 1.3 g/dL (ref 0.7–1.3)
Gamma Glob SerPl Elph-Mcnc: 1 g/dL (ref 0.4–1.8)
Globulin, Total: 3.2 g/dL (ref 2.2–3.9)
IgA/Immunoglobulin A, Serum: 350 mg/dL (ref 61–437)
IgG (Immunoglobin G), Serum: 1117 mg/dL (ref 603–1613)
IgM (Immunoglobulin M), Srm: 144 mg/dL (ref 20–172)

## 2023-09-18 LAB — COMPREHENSIVE METABOLIC PANEL WITH GFR
ALT: 68 IU/L — ABNORMAL HIGH (ref 0–44)
AST: 169 IU/L — ABNORMAL HIGH (ref 0–40)
Albumin: 3.8 g/dL — ABNORMAL LOW (ref 3.9–4.9)
Alkaline Phosphatase: 96 IU/L (ref 44–121)
BUN/Creatinine Ratio: 9 — ABNORMAL LOW (ref 10–24)
BUN: 10 mg/dL (ref 8–27)
Bilirubin Total: 0.4 mg/dL (ref 0.0–1.2)
CO2: 25 mmol/L (ref 20–29)
Calcium: 9.3 mg/dL (ref 8.6–10.2)
Chloride: 106 mmol/L (ref 96–106)
Creatinine, Ser: 1.1 mg/dL (ref 0.76–1.27)
Globulin, Total: 2.9 g/dL (ref 1.5–4.5)
Glucose: 82 mg/dL (ref 70–99)
Potassium: 4.5 mmol/L (ref 3.5–5.2)
Sodium: 147 mmol/L — ABNORMAL HIGH (ref 134–144)
Total Protein: 6.7 g/dL (ref 6.0–8.5)
eGFR: 72 mL/min/1.73 (ref >59)

## 2023-09-18 LAB — VITAMIN B6

## 2023-09-18 LAB — B12 AND FOLATE PANEL
Folate: 14.3 ng/mL (ref >3.0)
Vitamin B-12: 969 pg/mL (ref 232–1245)

## 2023-09-18 LAB — TSH RFX ON ABNORMAL TO FREE T4: TSH: 4.47 u[IU]/mL (ref 0.450–4.500)

## 2023-09-18 LAB — HEAVY METALS, BLOOD
Arsenic: 2 ug/L (ref 0–9)
Lead, Blood: 1.3 ug/dL (ref 0.0–3.4)
Mercury: <1 ug/L (ref 0.0–14.9)

## 2023-09-18 LAB — ANTI-HU, ANTI-RI, ANTI-YO IFA

## 2023-09-18 LAB — CK: Total CK: 86 U/L (ref 41–331)

## 2023-09-18 LAB — SEDIMENTATION RATE: Sed Rate: 17 mm/h (ref 0–30)

## 2023-09-18 LAB — METHYLMALONIC ACID, SERUM: Methylmalonic Acid: 92 nmol/L (ref 0–378)

## 2023-09-18 LAB — VITAMIN B1: Thiamine: 158.2 nmol/L (ref 66.5–200.0)

## 2023-10-03 ENCOUNTER — Inpatient Hospital Stay (HOSPITAL_BASED_OUTPATIENT_CLINIC_OR_DEPARTMENT_OTHER)
Admission: RE | Admit: 2023-10-03 | Discharge: 2023-10-03 | Disposition: A | Discharge status: Home / Self Care | Source: Ambulatory Visit | Attending: Neurology | Admitting: Radiology

## 2023-10-03 DIAGNOSIS — R200 Anesthesia of skin: Secondary | ICD-10-CM | POA: Diagnosis not present

## 2023-10-03 DIAGNOSIS — R29898 Other symptoms and signs involving the musculoskeletal system: Secondary | ICD-10-CM

## 2023-10-03 DIAGNOSIS — M79604 Pain in right leg: Secondary | ICD-10-CM

## 2023-10-03 DIAGNOSIS — M6240 Contracture of muscle, unspecified site: Secondary | ICD-10-CM

## 2023-10-03 DIAGNOSIS — M79605 Pain in left leg: Secondary | ICD-10-CM

## 2023-10-03 MED ORDER — GADOBUTROL 1 MMOL/ML IV SOLN
8.0000 mL | Freq: Once | INTRAVENOUS | Status: AC | PRN
  Administered 2023-10-03: 8 mL via INTRAVENOUS

## 2023-11-05 ENCOUNTER — Other Ambulatory Visit: Payer: Self-pay | Admitting: Internal Medicine

## 2023-11-12 ENCOUNTER — Other Ambulatory Visit: Payer: Self-pay | Admitting: Internal Medicine

## 2023-11-13 ENCOUNTER — Other Ambulatory Visit: Payer: Self-pay | Admitting: Internal Medicine

## 2023-11-14 ENCOUNTER — Ambulatory Visit (INDEPENDENT_AMBULATORY_CARE_PROVIDER_SITE_OTHER): Payer: Self-pay | Admitting: Neurology

## 2023-11-14 DIAGNOSIS — M79604 Pain in right leg: Secondary | ICD-10-CM

## 2023-11-14 DIAGNOSIS — R200 Anesthesia of skin: Secondary | ICD-10-CM

## 2023-11-14 DIAGNOSIS — R29898 Other symptoms and signs involving the musculoskeletal system: Secondary | ICD-10-CM

## 2023-11-14 DIAGNOSIS — M6240 Contracture of muscle, unspecified site: Secondary | ICD-10-CM

## 2023-11-14 DIAGNOSIS — Z0289 Encounter for other administrative examinations: Secondary | ICD-10-CM

## 2023-11-14 NOTE — Progress Notes (Unsigned)
 Can voluntarily stop the movements. Normal exam.  Also arm muscle movements when patient is aware I am looking Will order mri brain and cervical spine.  Irregular large muscle movements and irregular tremor He is very emotional today,  Appears to be a functional neurologic condition however  With distraction stops He drinks 1/5 of vodka a day Muscle movements stop when distracted

## 2023-11-19 LAB — PSA: PSA: 0.2

## 2023-11-25 ENCOUNTER — Other Ambulatory Visit: Payer: Self-pay | Admitting: Internal Medicine

## 2023-11-25 MED ORDER — BISOPROLOL FUMARATE 10 MG PO TABS: 10.0000 mg | ORAL_TABLET | Freq: Every day | ORAL | 3 refills | Status: AC

## 2023-11-25 NOTE — Telephone Encounter (Signed)
 Copied from CRM [id number]. Topic: Clinical - Medication Refill >> Nov 25, 2023  9:45 AM Macario HERO wrote: Medication: bisoprolol  (ZEBETA ) 10 MG tablet [[id number]]  Has the patient contacted their pharmacy? Yes (Agent: If no, request that the patient contact the pharmacy for the refill. If patient does not wish to contact the pharmacy document the reason why and proceed with request.) (Agent: If yes, when and what did the pharmacy advise?)  This is the patient's preferred pharmacy:  [id number] - [place], [place] - [address] [facility] [place] [address] Phone: [phone] Fax: [phone]    Is this the correct pharmacy for this prescription? Yes If no, delete pharmacy and type the correct one.   Has the prescription been filled recently? Yes  Is the patient out of the medication? Yes  Has the patient been seen for an appointment in the last year OR does the patient have an upcoming appointment? Yes  Can we respond through MyChart? Yes  Agent: Please be advised that Rx refills may take up to 3 business days. We ask that you follow-up with your pharmacy.

## 2023-11-28 ENCOUNTER — Encounter: Payer: Self-pay | Admitting: Internal Medicine

## 2023-12-03 ENCOUNTER — Other Ambulatory Visit: Payer: Self-pay | Admitting: Internal Medicine

## 2023-12-03 NOTE — Telephone Encounter (Signed)
 Copied from CRM #[id number]. Topic: Clinical - Medication Refill >> Dec 03, 2023  2:41 PM Thersia C wrote: Medication: ALPRAZolam  (XANAX ) 0.5 MG tablet  Has the patient contacted their pharmacy? Yes (Agent: If no, request that the patient contact the pharmacy for the refill. If patient does not wish to contact the pharmacy document the reason why and proceed with request.) (Agent: If yes, when and what did the pharmacy advise?)  This is the patient's preferred pharmacy:  [id number] - [place], [place] - [address] [facility] [place] [address] Phone: [phone] Fax: [phone]    Is this the correct pharmacy for this prescription? Yes If no, delete pharmacy and type the correct one.   Has the prescription been filled recently? No  Is the patient out of the medication? Yes  Has the patient been seen for an appointment in the last year OR does the patient have an upcoming appointment? Yes  Can we respond through MyChart? Yes  Agent: Please be advised that Rx refills may take up to 3 business days. We ask that you follow-up with your pharmacy.

## 2023-12-04 MED ORDER — ALPRAZOLAM 0.5 MG PO TABS: 0.5000 mg | ORAL_TABLET | Freq: Three times a day (TID) | ORAL | 0 refills | Status: DC | PRN

## 2023-12-24 LAB — HM DIABETES EYE EXAM

## 2024-01-02 ENCOUNTER — Ambulatory Visit (INDEPENDENT_AMBULATORY_CARE_PROVIDER_SITE_OTHER): Admitting: Internal Medicine

## 2024-01-02 ENCOUNTER — Encounter: Payer: Self-pay | Admitting: Internal Medicine

## 2024-01-02 VITALS — BP 146/87 | HR 70 | Temp 98.5°F | Wt 199.0 lb

## 2024-01-02 DIAGNOSIS — E1159 Type 2 diabetes mellitus with other circulatory complications: Secondary | ICD-10-CM | POA: Diagnosis not present

## 2024-01-02 DIAGNOSIS — E1169 Type 2 diabetes mellitus with other specified complication: Secondary | ICD-10-CM

## 2024-01-02 DIAGNOSIS — I152 Hypertension secondary to endocrine disorders: Secondary | ICD-10-CM | POA: Diagnosis not present

## 2024-01-02 DIAGNOSIS — E785 Hyperlipidemia, unspecified: Secondary | ICD-10-CM

## 2024-01-02 DIAGNOSIS — Z23 Encounter for immunization: Secondary | ICD-10-CM

## 2024-01-02 DIAGNOSIS — E119 Type 2 diabetes mellitus without complications: Secondary | ICD-10-CM

## 2024-01-02 LAB — POCT GLYCOSYLATED HEMOGLOBIN (HGB A1C): Hemoglobin A1C: 5.8 % — AB (ref 4.0–5.6)

## 2024-01-02 MED ORDER — AMLODIPINE BESYLATE 5 MG PO TABS: 5.0000 mg | ORAL_TABLET | Freq: Every day | ORAL | 1 refills | Status: DC

## 2024-01-02 NOTE — Assessment & Plan Note (Signed)
 Not well-controlled on current.  For reasons unclear to me he is not taking amlodipine  he has on file.  Will resume.

## 2024-01-02 NOTE — Addendum Note (Signed)
 Addended by: KATHRYNE MILLMAN B on: 01/02/2024 11:14 AM   Modules accepted: Orders

## 2024-01-02 NOTE — Assessment & Plan Note (Signed)
 Goal LDL < 70.  Check labs with next lab draw.  On atorvastatin .

## 2024-01-02 NOTE — Assessment & Plan Note (Signed)
 Well controlled with an A1c of 5.8.

## 2024-01-02 NOTE — Progress Notes (Signed)
 Established Patient Office Visit     CC/Reason for Visit: Follow-up chronic conditions  HPI: Jose [name] is a 73 y.o. male who is coming in today for the above mentioned reasons. Past Medical History is significant for: Type 2 diabetes, hypertension, hyperlipidemia.  No acute concerns or complaints.  Here for follow-up.   Past Medical/Surgical History: Past Medical History:  Diagnosis Date   ALCOHOL ABUSE 07/27/2008   Allergy     ANXIETY 07/27/2008   Arthritis    Bifascicular block    beta blocker stopped due to profound bradycardia   CAD 07/27/2008   a. s/p Endeavor DES to CFX 07/2008; residual OM1 70%, EF 45%;  b. relook cath 5/10: patent stent in CFX;  c.  LHC (8/15):  prox LAD 30%, OM1 50% at bifurcation, OM1 sub-branch 50%, mid AVCFX stent patent, EF 55-65% - no change from 2010 >>> Med Rx (after abnormal nuc)    Cataract    Diabetes mellitus without complication (HCC)    GERD 09/18/2006   History of TIA (transient ischemic attack) 07/2008   Hx of adenomatous colonic polyps    Hx of cardiovascular stress test    ETT-Myoview (09/2013):  Partially fixed defect with some reversibility - cannot r/o inf ischemia vs diaph atten, EF 51%; mod risk   Hyperlipidemia    HYPERTENSION 09/18/2006   LOW BACK PAIN 09/18/2006   Myocardial infarction (HCC) 2010   Numbness and tingling    finger tips at times   Rosacea 07/27/2008   SEIZURE DISORDER 09/18/2006   none since 2008   Squamous cell carcinoma of skin 07/19/2021   in situ - right nasal sidewall   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   Urinary tract infection start cipro  01-07-2012    Past Surgical History:  Procedure Laterality Date   CORONARY STENT PLACEMENT     drug eluting   GOLD SEED IMPLANT N/A 04/11/2021   Procedure: GOLD SEED IMPLANT SPACE OAR;  Surgeon: Elisabeth Valli JONETTA, MD;  Location: [facility];  Service: Urology;  Laterality: N/A;   HERNIA REPAIR     INGUINAL HERNIA REPAIR  01/14/2012   Procedure:  LAPAROSCOPIC INGUINAL HERNIA;  Surgeon: Lynda Leos, MD;  Location: [facility];  Service: General;  Laterality: Right;   INSERTION OF MESH  01/14/2012   Procedure: INSERTION OF MESH;  Surgeon: Lynda Leos, MD;  Location: [facility];  Service: General;  Laterality: Right;   KNEE SURGERY     LEFT HEART CATHETERIZATION WITH CORONARY ANGIOGRAM N/A 10/21/2013   Procedure: LEFT HEART CATHETERIZATION WITH CORONARY ANGIOGRAM;  Surgeon: Ozell JONETTA Fell, MD;  Location: [facility] CATH LAB;  Service: Cardiovascular;  Laterality: N/A;   MEDIAL PARTIAL KNEE REPLACEMENT Left     Social History:  reports that he has never smoked. He quit smokeless tobacco use about 11 years ago.  His smokeless tobacco use included chew. He reports that he does not drink alcohol and does not use drugs.  Allergies: No Known Allergies  Family History:  Family History  Problem Relation Age of Onset   Lung cancer Mother    Hypertension Mother    Diabetes Mother    Hyperlipidemia Mother    Emphysema Father    Heart failure Father    Melanoma Sister    Melanoma Paternal Grandmother    Heart attack Other    Colon cancer Neg Hx    Stroke Neg Hx    Esophageal cancer Neg Hx    Liver cancer Neg Hx  Pancreatic cancer Neg Hx    Stomach cancer Neg Hx    Rectal cancer Neg Hx      Current Outpatient Medications:    acetaminophen  (TYLENOL ) 500 MG tablet, Take 500-1,000 mg by mouth as needed for moderate pain., Disp: , Rfl:    ALPRAZolam  (XANAX ) 0.5 MG tablet, Take 1 tablet (0.5 mg total) by mouth 3 (three) times daily as needed., Disp: 60 tablet, Rfl: 0   Ascorbic Acid (VITAMIN C) 100 MG tablet, Take 100 mg by mouth daily., Disp: , Rfl:    aspirin  EC 81 MG tablet, Take 81 mg by mouth at bedtime., Disp: , Rfl:    atorvastatin  (LIPITOR) 20 MG tablet, TAKE 1 TABLET BY MOUTH EVERY DAY, Disp: 90 tablet, Rfl: 1   Azelaic Acid  15 % cream, APPLY 1 APPLICATION TOPICALLY AS DIRECTED. AFTER SKIN IS THOROUGHLY WASHED AND PATTED DRY, GENTLY  BUT THOROUGHLY MASSAGE A THIN FILM OF AZELAIC ACID  INTO THE AFFECTED AREA TWICE DAILY, IN THE MORNING AND EVENING. APPLIED TO FACE., Disp: 50 g, Rfl: 0   bisoprolol  (ZEBETA ) 10 MG tablet, Take 1 tablet (10 mg total) by mouth daily., Disp: 90 tablet, Rfl: 3   Cholecalciferol (VITAMIN D3) 75 MCG (3000 UT) TABS, Take by mouth., Disp: , Rfl:    Continuous Glucose Sensor (FREESTYLE LIBRE 3 SENSOR) MISC, Place 1 sensor on the skin every 14 days. Use to check glucose continuously, Disp: 2 each, Rfl: 3   EPINEPHrine  0.3 mg/0.3 mL IJ SOAJ injection, Inject 0.3 mg into the muscle as needed for anaphylaxis., Disp: 1 each, Rfl: 0   glimepiride  (AMARYL ) 2 MG tablet, TAKE 1 TABLET BY MOUTH DAILY WITH BREAKFAST, Disp: 90 tablet, Rfl: 1   Lancets (ONETOUCH DELICA PLUS LANCET30G) MISC, USE AS DIRECTED ONCE DAILY, Disp: 100 each, Rfl: 0   melatonin 3 MG TABS tablet, Take 3 mg by mouth at bedtime., Disp: , Rfl:    Multiple Vitamins-Minerals (MULTIVITAMIN WITH MINERALS) tablet, Take 1 tablet by mouth at bedtime., Disp: , Rfl:    nitroGLYCERIN  (NITROSTAT ) 0.4 MG SL tablet, DISSOLVE 1 TABLET BY MOUTH UNDER THE TONGUE EVERY 5 MINUTES AS NEEDED FOR CHEST PAIN, Disp: 75 tablet, Rfl: 5   pregabalin  (LYRICA ) 50 MG capsule, Start with one tablet twice daily for 1 week and can increase to 3x daily(if no side effects), Disp: 90 capsule, Rfl: 3   sildenafil (VIAGRA) 100 MG tablet, Take 100 mg by mouth daily as needed for erectile dysfunction., Disp: , Rfl:    tamsulosin  (FLOMAX ) 0.4 MG CAPS capsule, Take 0.4 mg by mouth in the morning and at bedtime., Disp: , Rfl:    amLODipine  (NORVASC ) 5 MG tablet, Take 1 tablet (5 mg total) by mouth daily., Disp: 90 tablet, Rfl: 1   ONETOUCH VERIO test strip, USE TO TEST BLOOD SUGAR ONCE A DAY DX E11.51 - (Patient not taking: Reported on 01/02/2024), Disp: 100 strip, Rfl: 3   oxybutynin (DITROPAN) 5 MG tablet, Take 5 mg by mouth at bedtime. (Patient not taking: Reported on 01/02/2024), Disp: ,  Rfl:   Review of Systems:  Negative unless indicated in HPI.   Physical Exam: Vitals:   01/02/24 0831 01/02/24 0833  BP: (!) 160/90 (!) 146/87  Pulse: 70   Temp: 98.5 F (36.9 C)   TempSrc: Oral   SpO2: 98%   Weight: 199 lb (90.3 kg)     Body mass index is 32.12 kg/m.   Physical Exam Vitals reviewed.  Constitutional:  Appearance: Normal appearance.  HENT:     Head: Normocephalic and atraumatic.  Eyes:     Conjunctiva/sclera: Conjunctivae normal.  Cardiovascular:     Rate and Rhythm: Normal rate and regular rhythm.  Pulmonary:     Effort: Pulmonary effort is normal.     Breath sounds: Normal breath sounds.  Skin:    General: Skin is warm and dry.  Neurological:     General: No focal deficit present.     Mental Status: He is alert and oriented to person, place, and time.  Psychiatric:        Mood and Affect: Mood normal.        Behavior: Behavior normal.        Thought Content: Thought content normal.        Judgment: Judgment normal.      Impression and Plan:  Type 2 diabetes mellitus without complication, without long-term current use of insulin  (HCC) Assessment & Plan: Well-controlled with an A1c of 5.8.  Orders: -     POCT glycosylated hemoglobin (Hb A1C)  Hypertension associated with diabetes [facility]) Assessment & Plan: Not well-controlled on current.  For reasons unclear to me he is not taking amlodipine  he has on file.  Will resume.  Orders: -     amLODIPine  Besylate; Take 1 tablet (5 mg total) by mouth daily.  Dispense: 90 tablet; Refill: 1  Hyperlipidemia associated with type 2 diabetes mellitus (HCC) Assessment & Plan: Goal LDL < 70.  Check labs with next lab draw.  On atorvastatin .   Immunization due  -Flu vaccine given in office today.   Time spent:31 minutes reviewing chart, interviewing and examining patient and formulating plan of care.     [name], MD [facility]

## 2024-01-07 ENCOUNTER — Encounter: Payer: Self-pay | Admitting: Internal Medicine

## 2024-01-07 DIAGNOSIS — I152 Hypertension secondary to endocrine disorders: Secondary | ICD-10-CM

## 2024-01-07 MED ORDER — AMLODIPINE BESYLATE 5 MG PO TABS: 5.0000 mg | ORAL_TABLET | Freq: Every day | ORAL | 1 refills | Status: AC

## 2024-01-07 MED ORDER — ALPRAZOLAM 0.5 MG PO TABS: 0.5000 mg | ORAL_TABLET | Freq: Three times a day (TID) | ORAL | 0 refills | Status: DC | PRN

## 2024-01-13 ENCOUNTER — Other Ambulatory Visit: Payer: Self-pay | Admitting: Internal Medicine

## 2024-01-13 DIAGNOSIS — E119 Type 2 diabetes mellitus without complications: Secondary | ICD-10-CM

## 2024-01-13 MED ORDER — FREESTYLE LIBRE 3 SENSOR MISC: 3 refills | Status: AC

## 2024-01-13 NOTE — Telephone Encounter (Signed)
 Copied from CRM [id number]. Topic: Clinical - Medication Refill >> Jan 13, 2024  7:47 AM Jose [name] wrote: Medication:  Continuous Glucose Sensor (FREESTYLE LIBRE 3 SENSOR) MISC  Has the patient contacted their pharmacy? Yes (Agent: If no, request that the patient contact the pharmacy for the refill. If patient does not wish to contact the pharmacy document the reason why and proceed with request.) (Agent: If yes, when and what did the pharmacy advise?)  This is the patient's preferred pharmacy:  [facility] - [place], [place] - [address] [place] [address] Phone: [phone] Fax: [phone] Hours: Not open 24 hours  Is this the correct pharmacy for this prescription? Yes If no, delete pharmacy and type the correct one.   Has the prescription been filled recently? No  Is the patient out of the medication? No, but current sensor has only 4 days left  Has the patient been seen for an appointment in the last year OR does the patient have an upcoming appointment? Yes  Can we respond through MyChart? Yes  Agent: Please be advised that Rx refills may take up to 3 business days. We ask that you follow-up with your pharmacy.

## 2024-01-14 ENCOUNTER — Telehealth: Payer: Self-pay | Admitting: *Deleted

## 2024-01-14 NOTE — Telephone Encounter (Signed)
Form completed, faxed, and confirmed

## 2024-01-14 NOTE — Telephone Encounter (Signed)
 Copied from CRM [id number]. Topic: Medical Record Request - Records Request >> Jan 14, 2024 10:23 AM Carlyon D wrote: Reason for CRM: shelby with [facility] in regards to needing pt medical records. Miss shelby is faxing over some stuff right now and is asking if this can be looked out for and faxed back.

## 2024-02-12 ENCOUNTER — Ambulatory Visit

## 2024-02-19 ENCOUNTER — Ambulatory Visit

## 2024-02-19 VITALS — BP 134/64 | HR 85 | Temp 98.9°F | Ht 66.0 in | Wt 200.6 lb

## 2024-02-19 DIAGNOSIS — Z1211 Encounter for screening for malignant neoplasm of colon: Secondary | ICD-10-CM

## 2024-02-19 DIAGNOSIS — Z0000 Encounter for general adult medical examination without abnormal findings: Secondary | ICD-10-CM | POA: Diagnosis not present

## 2024-02-19 NOTE — Progress Notes (Signed)
 Chief Complaint  Patient presents with   Medicare Wellness     Subjective:   Jose [name] is a 73 y.o. male who presents for a Medicare Annual Wellness Visit.  Visit info / Clinical Intake: Medicare Wellness Visit Type:: Subsequent Annual Wellness Visit Persons participating in visit and providing information:: patient Medicare Wellness Visit Mode:: In-person (required for WTM) Interpreter Needed?: No Pre-visit prep was completed: yes AWV questionnaire completed by patient prior to visit?: no Living arrangements:: lives with spouse/significant other Patient's Overall Health Status Rating: very good Typical amount of pain: none Does pain affect daily life?: no Are you currently prescribed opioids?: no  Dietary Habits and Nutritional Risks How many meals a day?: 3 Eats fruit and vegetables daily?: yes Most meals are obtained by: preparing own meals In the last 2 weeks, have you had any of the following?: none Diabetic:: (!) yes Any non-healing wounds?: no How often do you check your BS?: continuous glucose monitor Would you like to be referred to a Nutritionist or for Diabetic Management? : no  Functional Status Activities of Daily Living (to include ambulation/medication): Independent Ambulation: Independent with device- listed below Home Assistive Devices/Equipment: Eyeglasses; Other (Comment) (Hearing Aids) Medication Administration: Independent Home Management (perform basic housework or laundry): Independent Manage your own finances?: yes Primary transportation is: driving Concerns about vision?: no *vision screening is required for WTM* Concerns about hearing?: (!) yes Uses hearing aids?: (!) yes Hear whispered voice?: yes  Fall Screening Falls in the past year?: 1 Number of falls in past year: 0 Was there an injury with Fall?: 0 Fall Risk Category Calculator: 1 Patient Fall Risk Level: Low Fall Risk  Fall Risk Patient at Risk for Falls Due to: Other  (Comment) (Tripped over rug) Fall risk Follow up: Falls evaluation completed; Falls prevention discussed  Home and Transportation Safety: All rugs have non-skid backing?: (!) no (Fall prevention discussed) All stairs or steps have railings?: yes Grab bars in the bathtub or shower?: yes Have non-skid surface in bathtub or shower?: (!) no Good home lighting?: yes Regular seat belt use?: yes Hospital stays in the last year:: no  Cognitive Assessment Difficulty concentrating, remembering, or making decisions? : yes Will 6CIT or Mini Cog be Completed: yes What year is it?: 0 points What month is it?: 0 points Give patient an address phrase to remember (5 components): [address] [place]  About what time is it?: 0 points Count backwards from 20 to 1: 0 points Say the months of the year in reverse: 0 points Repeat the address phrase from earlier: 0 points 6 CIT Score: 0 points  Advance Directives (For Healthcare) Does Patient Have a Medical Advance Directive?: Yes Does patient want to make changes to medical advance directive?: No - Patient declined Type of Advance Directive: Healthcare Power of [place]; Living will Copy of Healthcare Power of Attorney in Chart?: No - copy requested Copy of Living Will in Chart?: No - copy requested  Reviewed/Updated  Reviewed/Updated: Reviewed All (Medical, Surgical, Family, Medications, Allergies, Care Teams, Patient Goals)    Allergies (verified) Patient has no known allergies.   Current Medications (verified) Outpatient Encounter Medications as of 02/19/2024  Medication Sig   acetaminophen  (TYLENOL ) 500 MG tablet Take 500-1,000 mg by mouth as needed for moderate pain.   ALPRAZolam  (XANAX ) 0.5 MG tablet Take 1 tablet (0.5 mg total) by mouth 3 (three) times daily as needed.   amLODipine  (NORVASC ) 5 MG tablet Take 1 tablet (5 mg total) by  mouth daily.   Ascorbic Acid (VITAMIN C) 100 MG tablet Take 100 mg by mouth daily.   aspirin  EC  81 MG tablet Take 81 mg by mouth at bedtime.   atorvastatin  (LIPITOR) 20 MG tablet TAKE 1 TABLET BY MOUTH EVERY DAY   Azelaic Acid  15 % cream APPLY 1 APPLICATION TOPICALLY AS DIRECTED. AFTER SKIN IS THOROUGHLY WASHED AND PATTED DRY, GENTLY BUT THOROUGHLY MASSAGE A THIN FILM OF AZELAIC ACID  INTO THE AFFECTED AREA TWICE DAILY, IN THE MORNING AND EVENING. APPLIED TO FACE.   bisoprolol  (ZEBETA ) 10 MG tablet Take 1 tablet (10 mg total) by mouth daily.   Cholecalciferol (VITAMIN D3) 75 MCG (3000 UT) TABS Take by mouth.   Continuous Glucose Sensor (FREESTYLE LIBRE 3 SENSOR) MISC Place 1 sensor on the skin every 14 days. Use to check glucose continuously   EPINEPHrine  0.3 mg/0.3 mL IJ SOAJ injection Inject 0.3 mg into the muscle as needed for anaphylaxis.   glimepiride  (AMARYL ) 2 MG tablet TAKE 1 TABLET BY MOUTH DAILY WITH BREAKFAST   Lancets (ONETOUCH DELICA PLUS LANCET30G) MISC USE AS DIRECTED ONCE DAILY   melatonin 3 MG TABS tablet Take 3 mg by mouth at bedtime.   Multiple Vitamins-Minerals (MULTIVITAMIN WITH MINERALS) tablet Take 1 tablet by mouth at bedtime.   nitroGLYCERIN  (NITROSTAT ) 0.4 MG SL tablet DISSOLVE 1 TABLET BY MOUTH UNDER THE TONGUE EVERY 5 MINUTES AS NEEDED FOR CHEST PAIN   ONETOUCH VERIO test strip USE TO TEST BLOOD SUGAR ONCE A DAY DX E11.51 - (Patient not taking: Reported on 01/02/2024)   oxybutynin (DITROPAN) 5 MG tablet Take 5 mg by mouth at bedtime. (Patient not taking: Reported on 01/02/2024)   pregabalin  (LYRICA ) 50 MG capsule Start with one tablet twice daily for 1 week and can increase to 3x daily(if no side effects)   sildenafil (VIAGRA) 100 MG tablet Take 100 mg by mouth daily as needed for erectile dysfunction.   tamsulosin  (FLOMAX ) 0.4 MG CAPS capsule Take 0.4 mg by mouth in the morning and at bedtime.   No facility-administered encounter medications on file as of 02/19/2024.    History: Past Medical History:  Diagnosis Date   ALCOHOL ABUSE 07/27/2008   Allergy      ANXIETY 07/27/2008   Arthritis    Bifascicular block    beta blocker stopped due to profound bradycardia   CAD 07/27/2008   a. s/p Endeavor DES to CFX 07/2008; residual OM1 70%, EF 45%;  b. relook cath 5/10: patent stent in CFX;  c.  LHC (8/15):  prox LAD 30%, OM1 50% at bifurcation, OM1 sub-branch 50%, mid AVCFX stent patent, EF 55-65% - no change from 2010 >>> Med Rx (after abnormal nuc)    Cataract    Diabetes mellitus without complication (HCC)    GERD 09/18/2006   History of TIA (transient ischemic attack) 07/2008   Hx of adenomatous colonic polyps    Hx of cardiovascular stress test    ETT-Myoview (09/2013):  Partially fixed defect with some reversibility - cannot r/o inf ischemia vs diaph atten, EF 51%; mod risk   Hyperlipidemia    HYPERTENSION 09/18/2006   LOW BACK PAIN 09/18/2006   Myocardial infarction (HCC) 2010   Numbness and tingling    finger tips at times   Rosacea 07/27/2008   SEIZURE DISORDER 09/18/2006   none since 2008   Squamous cell carcinoma of skin 07/19/2021   in situ - right nasal sidewall   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   Urinary tract infection start cipro   01-07-2012   Past Surgical History:  Procedure Laterality Date   CORONARY STENT PLACEMENT     drug eluting   GOLD SEED IMPLANT N/A 04/11/2021   Procedure: GOLD SEED IMPLANT SPACE OAR;  Surgeon: Elisabeth Valli BIRCH, MD;  Location: [facility];  Service: Urology;  Laterality: N/A;   HERNIA REPAIR     INGUINAL HERNIA REPAIR  01/14/2012   Procedure: LAPAROSCOPIC INGUINAL HERNIA;  Surgeon: Lynda Leos, MD;  Location: [facility];  Service: General;  Laterality: Right;   INSERTION OF MESH  01/14/2012   Procedure: INSERTION OF MESH;  Surgeon: Lynda Leos, MD;  Location: [facility];  Service: General;  Laterality: Right;   KNEE SURGERY     LEFT HEART CATHETERIZATION WITH CORONARY ANGIOGRAM N/A 10/21/2013   Procedure: LEFT HEART CATHETERIZATION WITH CORONARY ANGIOGRAM;  Surgeon: Ozell BIRCH Fell, MD;   [facility] C[facility] LAB;  Service: Cardiovascular;  Laterality: N/A;   MEDIAL PARTIAL KNEE REPLACEMENT Left    Family History  Problem Relation Age of Onset   Lung cancer Mother    Hypertension Mother    Diabetes Mother    Hyperlipidemia Mother    Emphysema Father    Heart failure Father    Melanoma Sister    Melanoma Paternal Grandmother    Heart attack Other    Colon cancer Neg Hx    Stroke Neg Hx    Esophageal cancer Neg Hx    Liver cancer Neg Hx    Pancreatic cancer Neg Hx    Stomach cancer Neg Hx    Rectal cancer Neg Hx    Social History   Occupational History   Occupation: Management Consultant: [employer]  Tobacco Use   Smoking status: Never   Smokeless tobacco: Former    Types: Chew    Quit date: 02/03/2012  Vaping Use   Vaping status: Never Used  Substance and Sexual Activity   Alcohol use: No    Comment: past drinker 2-3 daily and sometimes binge drinks on the weekend   Drug use: No   Sexual activity: Yes   Tobacco Counseling Counseling given: No  SDOH Screenings   Food Insecurity: No Food Insecurity (02/19/2024)  Housing: Unknown (02/19/2024)  Transportation Needs: No Transportation Needs (02/19/2024)  Utilities: Not At Risk (02/19/2024)  Alcohol Screen: Low Risk (06/04/2022)  Depression (PHQ2-9): Low Risk (02/19/2024)  Financial Resource Strain: Low Risk (06/04/2022)  Physical Activity: Inactive (02/19/2024)  Social Connections: Socially Integrated (02/19/2024)  Stress: No Stress Concern Present (02/19/2024)  Tobacco Use: Medium Risk (02/19/2024)  Health Literacy: Adequate Health Literacy (02/19/2024)   See flowsheets for full screening details  Depression Screen PHQ 2 & 9 Depression Scale- Over the past 2 weeks, how often have you been bothered by any of the following problems? Little interest or pleasure in doing things: 0 Feeling down, depressed, or hopeless (PHQ Adolescent also includes...irritable): 0 PHQ-2  Total Score: 0 Trouble falling or staying asleep, or sleeping too much: 0 Feeling tired or having little energy: 0 Poor appetite or overeating (PHQ Adolescent also includes...weight loss): 0 Feeling bad about yourself - or that you are a failure or have let yourself or your family down: 0 Trouble concentrating on things, such as reading the newspaper or watching television (PHQ Adolescent also includes...like school work): 0 Moving or speaking so slowly that other people could have noticed. Or the opposite - being so fidgety or restless that you have been moving around a lot more than  usual: 0 Thoughts that you would be better off dead, or of hurting yourself in some way: 0 PHQ-9 Total Score: 0 If you checked off any problems, how difficult have these problems made it for you to do your work, take care of things at home, or get along with other people?: Not difficult at all     Goals Addressed               This Visit's Progress     Increase physical activity (pt-stated)        Get more active.             Objective:    Today's Vitals   02/19/24 1332  BP: 134/64  Pulse: 85  Temp: 98.9 F (37.2 C)  TempSrc: Oral  SpO2: 95%  Weight: 200 lb 9.6 oz (91 kg)  Height: 5' 6 (1.676 m)   Body mass index is 32.38 kg/m.  Hearing/Vision screen Hearing Screening - Comments:: Wears Hearing Aids Vision Screening - Comments:: Wears rx glasses - up to date with routine eye exams with  [facility] Immunizations and Health Maintenance Health Maintenance  Topic Date Due   Diabetic kidney evaluation - Urine ACR  Never done   Hepatitis C Screening  Never done   Zoster Vaccines- Shingrix (1 of 2) 09/17/1971   FOOT EXAM  06/04/2018   COVID-19 Vaccine (3 - Pfizer risk series) 04/04/2020   Colonoscopy  09/17/2022   DTaP/Tdap/Td (2 - Td or Tdap) 06/26/2023   HEMOGLOBIN A1C  07/02/2024   Diabetic kidney evaluation - eGFR measurement  09/12/2024   OPHTHALMOLOGY EXAM  12/23/2024    Medicare Annual Wellness (AWV)  02/18/2025   Pneumococcal Vaccine: 50+ Years  Completed   Influenza Vaccine  Completed   Meningococcal B Vaccine  Aged Out        Assessment/Plan:  This is a routine wellness examination for Jose [name].  Patient Care Team: [name], [name], MD as PCP - General (Internal Medicine) Wonda Sharper, MD as PCP - Cardiology (Cardiology) Livingston Rigg, MD as Consulting Physician (Dermatology) Elisabeth Valli BIRCH, MD as Consulting Physician (Urology) Patrcia Cough, MD as Consulting Physician (Radiation Oncology) Crawford, Morna Pickle, NP as Nurse Practitioner (Hematology and Oncology) Starla Wendelyn BIRCH, RN as Registered Nurse Carolee Sherwood BIRCH DOUGLAS, MD as Consulting Physician (Urology) Erasmo Bernardino BRAVO, OD (Optometry)  I have personally reviewed and noted the following in the patients chart:   Medical and social history Use of alcohol, tobacco or illicit drugs  Current medications and supplements including opioid prescriptions. Functional ability and status Nutritional status Physical activity Advanced directives List of other physicians Hospitalizations, surgeries, and ER visits in previous 12 months Vitals Screenings to include cognitive, depression, and falls Referrals and appointments  Orders Placed This Encounter  Procedures   Ambulatory referral to Gastroenterology    Referral Priority:   Routine    Referral Type:   Consultation    Referral Reason:   Specialty Services Required    Number of Visits Requested:   1   In addition, I have reviewed and discussed with patient certain preventive protocols, quality metrics, and best practice recommendations. A written personalized care plan for preventive services as well as general preventive health recommendations were provided to patient.   Rojelio LELON Blush, LPN   [id number]   Return in 53 weeks (on 02/24/2025).  After Visit Summary: (In Person-Declined) Patient declined AVS at this  time.  Nurse Notes: HM Addressed: Referral sent to GI  for colonoscopy

## 2024-02-19 NOTE — Patient Instructions (Addendum)
 Mr. Bellucci,  Thank you for taking the time for your Medicare Wellness Visit. I appreciate your continued commitment to your health goals. Please review the care plan we discussed, and feel free to reach out if I can assist you further.  Please note that Annual Wellness Visits do not include a physical exam. Some assessments may be limited, especially if the visit was conducted virtually. If needed, we may recommend an in-person follow-up with your provider.  Ongoing Care Seeing your primary care provider every 3 to 6 months helps us  monitor your health and provide consistent, personalized care.   Referrals If a referral was made during today's visit and you haven't received any updates within two weeks, please contact the referred provider directly to check on the status.  Recommended Screenings:  Health Maintenance  Topic Date Due   Yearly kidney health urinalysis for diabetes  Never done   Hepatitis C Screening  Never done   Zoster (Shingles) Vaccine (1 of 2) 09/17/1971   Complete foot exam   06/04/2018   COVID-19 Vaccine (3 - Pfizer risk series) 04/04/2020   Colon Cancer Screening  09/17/2022   DTaP/Tdap/Td vaccine (2 - Td or Tdap) 06/26/2023   Hemoglobin A1C  07/02/2024   Yearly kidney function blood test for diabetes  09/12/2024   Eye exam for diabetics  12/23/2024   Medicare Annual Wellness Visit  02/18/2025   Pneumococcal Vaccine for age over 50  Completed   Flu Shot  Completed   Meningitis B Vaccine  Aged Out       02/19/2024    1:45 PM  Advanced Directives  Does Patient Have a Medical Advance Directive? Yes  Type of Estate Agent of [place];Living will  Does patient want to make changes to medical advance directive? No - Patient declined  Copy of Healthcare Power of Attorney in Chart? No - copy requested    Vision: Annual vision screenings are recommended for early detection of glaucoma, cataracts, and diabetic retinopathy. These exams can also  reveal signs of chronic conditions such as diabetes and high blood pressure.  Dental: Annual dental screenings help detect early signs of oral cancer, gum disease, and other conditions linked to overall health, including heart disease and diabetes.  Please see the attached documents for additional preventive care recommendations.

## 2024-02-28 ENCOUNTER — Encounter: Payer: Self-pay | Admitting: Internal Medicine

## 2024-02-28 ENCOUNTER — Other Ambulatory Visit: Payer: Self-pay | Admitting: Internal Medicine

## 2024-03-02 ENCOUNTER — Other Ambulatory Visit: Payer: Self-pay

## 2024-03-03 MED ORDER — PREGABALIN 50 MG PO CAPS: ORAL_CAPSULE | ORAL | 0 refills | Status: DC

## 2024-03-09 ENCOUNTER — Other Ambulatory Visit: Payer: Self-pay | Admitting: Internal Medicine

## 2024-03-09 ENCOUNTER — Encounter: Payer: Self-pay | Admitting: Internal Medicine

## 2024-03-09 MED ORDER — ALPRAZOLAM 0.5 MG PO TABS: 0.5000 mg | ORAL_TABLET | Freq: Three times a day (TID) | ORAL | 0 refills | Status: AC | PRN

## 2024-03-09 MED ORDER — PREGABALIN 50 MG PO CAPS: ORAL_CAPSULE | ORAL | 0 refills | Status: AC

## 2024-03-10 ENCOUNTER — Telehealth: Payer: Self-pay | Admitting: Neurology

## 2024-03-10 NOTE — Telephone Encounter (Signed)
 Can you call the patient and schedule an appointment so that his medication can be managed? Patient was [facility], but has been assigned to [facility].

## 2024-03-10 NOTE — Telephone Encounter (Signed)
 pregabalin  (LYRICA ) 50 MG capsule  0 ordered       Summary:3x daily(if no side effects). No refills: Needs to see Dr Margaret or transfer prescription to PCP., Normal Dose, Route, Frequency:Start:01/05/2026Ordered On:01/05/2026Pharmacy:[id number] - [place], [place] - [address]:Dx Associated:Taking:Long-term:Med Note:

## 2024-03-11 ENCOUNTER — Ambulatory Visit

## 2024-03-11 VITALS — Ht 66.0 in | Wt 203.0 lb

## 2024-03-11 DIAGNOSIS — Z860100 Personal history of colon polyps, unspecified: Secondary | ICD-10-CM

## 2024-03-11 MED ORDER — NA SULFATE-K SULFATE-MG SULF 17.5-3.13-1.6 GM/177ML PO SOLN: 1.0000 | Freq: Once | ORAL | 0 refills | Status: AC

## 2024-03-11 NOTE — Progress Notes (Signed)
 No egg or soy allergy  known to patient  No issues known to pt with past sedation with any surgeries or procedures- PER PATIENT LONGER TIME TO WAKE UP  Patient denies ever being told they had issues or difficulty with intubation  No FH of Malignant Hyperthermia Pt is not on diet pills Pt is not on  home 02  Pt is not on blood thinners  Pt denies issues with constipation  No A fib or A flutter Have any cardiac testing pending--NO Pt can ambulate-INDEPENDENTLY  Pt denies use of chewing tobacco Discussed diabetic I weight loss medication holds Discussed NSAID holds Checked BMI Pt instructed to use Singlecare.com or GoodRx for a price reduction on prep  Patient's chart reviewed by Norleen Schillings CNRA prior to previsit and patient appropriate for the [facility].  Pre visit completed and red dot placed by patient's name on their procedure day (on provider's schedule).

## 2024-03-12 NOTE — Telephone Encounter (Signed)
 Can you make the patient aware that if no appointments are scheduled with us , he will need to transfer his prescription to PCP for management.

## 2024-03-24 ENCOUNTER — Ambulatory Visit: Admitting: Internal Medicine

## 2024-03-24 ENCOUNTER — Encounter: Payer: Self-pay | Admitting: Internal Medicine

## 2024-03-24 VITALS — BP 139/59 | HR 57 | Temp 99.1°F | Resp 13 | Ht 66.0 in | Wt 203.0 lb

## 2024-03-24 DIAGNOSIS — D123 Benign neoplasm of transverse colon: Secondary | ICD-10-CM | POA: Diagnosis not present

## 2024-03-24 DIAGNOSIS — Z1211 Encounter for screening for malignant neoplasm of colon: Secondary | ICD-10-CM | POA: Diagnosis not present

## 2024-03-24 DIAGNOSIS — Z860100 Personal history of colon polyps, unspecified: Secondary | ICD-10-CM

## 2024-03-24 DIAGNOSIS — K648 Other hemorrhoids: Secondary | ICD-10-CM | POA: Diagnosis not present

## 2024-03-24 DIAGNOSIS — K5730 Diverticulosis of large intestine without perforation or abscess without bleeding: Secondary | ICD-10-CM

## 2024-03-24 MED ORDER — SODIUM CHLORIDE 0.9 % IV SOLN: 500.0000 mL | Freq: Once | INTRAVENOUS | Status: AC

## 2024-03-24 NOTE — Progress Notes (Signed)
 Called to room to assist during endoscopic procedure.  Patient ID and intended procedure confirmed with present staff. Received instructions for my participation in the procedure from the performing physician.

## 2024-03-24 NOTE — Progress Notes (Signed)
 "   GASTROENTEROLOGY PROCEDURE H&P NOTE   Primary Care Physician: [name], [name], MD    Reason for Procedure:   History of colon polyps  Plan:    Colonoscopy  Patient is appropriate for endoscopic procedure(s) in the ambulatory ([facility]) setting.  The nature of the procedure, as well as the risks, benefits, and alternatives were carefully and thoroughly reviewed with the patient. Ample time for discussion and questions allowed. The patient understood, was satisfied, and agreed to proceed.     HPI: Jose [name] is a 74 y.o. male who presents for colonoscopy for history of colon polyps. Denies blood in stools, changes in bowel habits, or unintentional weight loss. Denies family history of colon cancer.  Colonoscopy 09/16/17: - Five sessile polyps were found in the descending colon, transverse colon and cecum. The polyps were 2 to 4 mm in size. These polyps were removed with a cold snare. Resection and retrieval were complete. - Multiple small and large- mouthed diverticula were found in the left colon. - The exam was otherwise without abnormality on direct and retroflexion views. Path: Surgical [P], descending, transverse and cecum, polyp (5) - TUBULAR ADENOMA (2). - NO HIGH GRADE DYSPLASIA OR MALIGNANCY.  Past Medical History:  Diagnosis Date   ALCOHOL ABUSE 07/27/2008   Allergy     ANXIETY 07/27/2008   Arthritis    Bifascicular block    beta blocker stopped due to profound bradycardia   CAD 07/27/2008   a. s/p Endeavor DES to CFX 07/2008; residual OM1 70%, EF 45%;  b. relook cath 5/10: patent stent in CFX;  c.  LHC (8/15):  prox LAD 30%, OM1 50% at bifurcation, OM1 sub-branch 50%, mid AVCFX stent patent, EF 55-65% - no change from 2010 >>> Med Rx (after abnormal nuc)    Cataract    Chronic kidney disease    2023   Diabetes mellitus without complication (HCC)    GERD 09/18/2006   History of TIA (transient ischemic attack) 07/2008   Hx of adenomatous colonic polyps    Hx  of cardiovascular stress test    ETT-Myoview (09/2013):  Partially fixed defect with some reversibility - cannot r/o inf ischemia vs diaph atten, EF 51%; mod risk   Hyperlipidemia    HYPERTENSION 09/18/2006   LOW BACK PAIN 09/18/2006   Myocardial infarction (HCC) 2010   Numbness and tingling    finger tips at times   Rosacea 07/27/2008   SEIZURE DISORDER 09/18/2006   none since 2008   Squamous cell carcinoma of skin 07/19/2021   in situ - right nasal sidewall   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   Urinary tract infection start cipro  01-07-2012    Past Surgical History:  Procedure Laterality Date   CORONARY STENT PLACEMENT     drug eluting   GOLD SEED IMPLANT N/A 04/11/2021   Procedure: GOLD SEED IMPLANT SPACE OAR;  Surgeon: Elisabeth Valli JONETTA, MD;  Location: [facility] [place];  Service: Urology;  Laterality: N/A;   HERNIA REPAIR     INGUINAL HERNIA REPAIR  01/14/2012   Procedure: LAPAROSCOPIC INGUINAL HERNIA;  Surgeon: Lynda Leos, MD;  Location: [facility];  Service: General;  Laterality: Right;   INSERTION OF MESH  01/14/2012   Procedure: INSERTION OF MESH;  Surgeon: Lynda Leos, MD;  Location: [facility];  Service: General;  Laterality: Right;   KNEE SURGERY Bilateral    BOTH KNEE'S REPLACED, DR. EMMIT, 2016 & 2020, PARTIAL LEFT. FULL RIGHT   LEFT HEART CATHETERIZATION WITH CORONARY ANGIOGRAM  N/A 10/21/2013   Procedure: LEFT HEART CATHETERIZATION WITH CORONARY ANGIOGRAM;  Surgeon: Ozell JONETTA Fell, MD;  Location: [facility];  Service: Cardiovascular;  Laterality: N/A;   MEDIAL PARTIAL KNEE REPLACEMENT Left     Prior to Admission medications  Medication Sig Start Date End Date Taking? Authorizing Provider  amLODipine  (NORVASC ) 5 MG tablet Take 1 tablet (5 mg total) by mouth daily. 01/07/24  Yes [name], [name], MD  Ascorbic Acid (VITAMIN C) 100 MG tablet Take 100 mg by mouth daily.   Yes [provider]  atorvastatin  (LIPITOR) 20 MG tablet TAKE 1 TABLET BY  MOUTH EVERY DAY 11/12/23  Yes [name], [name], MD  bisoprolol  (ZEBETA ) 10 MG tablet Take 1 tablet (10 mg total) by mouth daily. 11/25/23  Yes [name], [name], MD  Cholecalciferol (VITAMIN D3) 75 MCG (3000 UT) TABS Take by mouth.   Yes [provider]  Continuous Glucose Sensor (FREESTYLE LIBRE 3 SENSOR) MISC Place 1 sensor on the skin every 14 days. Use to check glucose continuously 01/13/24  Yes [name], [name], MD  glimepiride  (AMARYL ) 2 MG tablet TAKE 1 TABLET BY MOUTH DAILY WITH BREAKFAST 11/13/23  Yes [name], [name], MD  Lancets [facility] DELICA PLUS Myra) MISC USE AS DIRECTED ONCE DAILY 08/05/23  Yes [name], [name], MD  [facility] VERIO test strip USE TO TEST BLOOD SUGAR ONCE A DAY DX E11.51 - 03/03/24  Yes [name], [name], MD  acetaminophen  (TYLENOL ) 500 MG tablet Take 500-1,000 mg by mouth as needed for moderate pain. Patient not taking: Reported on 03/11/2024    [provider]  ALPRAZolam  (XANAX ) 0.5 MG tablet Take 1 tablet (0.5 mg total) by mouth 3 (three) times daily as needed. 03/09/24   [name], [name], MD  aspirin  EC 81 MG tablet Take 81 mg by mouth at bedtime.    [provider]  Azelaic Acid  15 % cream APPLY 1 APPLICATION TOPICALLY AS DIRECTED. AFTER SKIN IS THOROUGHLY WASHED AND PATTED DRY, GENTLY BUT THOROUGHLY MASSAGE A THIN FILM OF AZELAIC ACID  INTO THE AFFECTED AREA TWICE DAILY, IN THE MORNING AND EVENING. APPLIED TO FACE. 10/22/18   [name], [name], MD  EPINEPHrine  0.3 mg/0.3 mL IJ SOAJ injection Inject 0.3 mg into the muscle as needed for anaphylaxis. 09/05/21   Randol Simmonds, MD  melatonin 3 MG TABS tablet Take 3 mg by mouth at bedtime. Patient not taking: Reported on 03/11/2024    [provider]  Multiple Vitamins-Minerals (MULTIVITAMIN WITH MINERALS) tablet Take 1 tablet by mouth at bedtime.    [provider]  nitroGLYCERIN  (NITROSTAT ) 0.4 MG SL tablet  DISSOLVE 1 TABLET BY MOUTH UNDER THE TONGUE EVERY 5 MINUTES AS NEEDED FOR CHEST PAIN 12/05/21   [name], [name], MD  oxybutynin (DITROPAN) 5 MG tablet Take 5 mg by mouth at bedtime. Patient not taking: Reported on 03/11/2024 02/18/22   [provider]  pregabalin  (LYRICA ) 50 MG capsule 3x daily(if no side effects). No refills:  Needs to see Dr Margaret or transfer prescription to PCP. 03/09/24   [name], [name], MD  sildenafil (VIAGRA) 100 MG tablet Take 100 mg by mouth daily as needed for erectile dysfunction. Patient not taking: Reported on 03/11/2024    [provider]  tamsulosin  (FLOMAX ) 0.4 MG CAPS capsule Take 0.4 mg by mouth in the morning and at bedtime. 05/26/21   [provider]    Current Outpatient Medications  Medication Sig Dispense Refill   amLODipine  (NORVASC ) 5 MG tablet Take 1 tablet (5 mg total) by mouth daily. 90 tablet 1   Ascorbic Acid (VITAMIN C) 100 MG tablet Take 100 mg by mouth daily.     atorvastatin  (LIPITOR) 20 MG tablet TAKE 1 TABLET BY MOUTH EVERY DAY 90 tablet 1   bisoprolol  (ZEBETA ) 10 MG tablet Take 1 tablet (10 mg total) by mouth daily. 90 tablet 3   Cholecalciferol (VITAMIN D3) 75 MCG (3000 UT) TABS Take by mouth.     Continuous Glucose Sensor (FREESTYLE LIBRE 3 SENSOR) MISC Place 1 sensor on the skin every 14 days. Use to check glucose continuously 2 each 3   glimepiride  (AMARYL ) 2 MG tablet TAKE 1 TABLET BY MOUTH DAILY WITH BREAKFAST 90 tablet 1   Lancets (ONETOUCH DELICA PLUS LANCET30G) MISC USE AS DIRECTED ONCE DAILY 100 each 0   ONETOUCH VERIO test strip USE TO TEST BLOOD SUGAR ONCE A DAY DX E11.51 - 100 strip 3   acetaminophen  (TYLENOL ) 500 MG tablet Take 500-1,000 mg by mouth as needed for moderate pain. (Patient not taking: Reported on 03/11/2024)     ALPRAZolam  (XANAX ) 0.5 MG tablet Take 1 tablet (0.5 mg total) by mouth 3 (three) times daily as needed. 30 tablet 0   aspirin  EC 81 MG tablet Take 81 mg by mouth  at bedtime.     Azelaic Acid  15 % cream APPLY 1 APPLICATION TOPICALLY AS DIRECTED. AFTER SKIN IS THOROUGHLY WASHED AND PATTED DRY, GENTLY BUT THOROUGHLY MASSAGE A THIN FILM OF AZELAIC ACID  INTO THE AFFECTED AREA TWICE DAILY, IN THE MORNING AND EVENING. APPLIED TO FACE. 50 g 0   EPINEPHrine  0.3 mg/0.3 mL IJ SOAJ injection Inject 0.3 mg into the muscle as needed for anaphylaxis. 1 each 0   melatonin 3 MG TABS tablet Take 3 mg by mouth at bedtime. (Patient not taking: Reported on 03/11/2024)     Multiple Vitamins-Minerals (MULTIVITAMIN WITH MINERALS) tablet Take 1 tablet by mouth at bedtime.     nitroGLYCERIN  (NITROSTAT ) 0.4 MG SL tablet DISSOLVE 1 TABLET BY MOUTH UNDER THE TONGUE EVERY 5 MINUTES AS NEEDED FOR CHEST PAIN 75 tablet 5   oxybutynin (DITROPAN) 5 MG tablet Take 5 mg by mouth at bedtime. (Patient not taking: Reported on 03/11/2024)     pregabalin  (LYRICA ) 50 MG capsule 3x daily(if no side effects). No refills:  Needs to see Dr Margaret or transfer prescription to PCP. 180 capsule 0   sildenafil (VIAGRA) 100 MG tablet Take 100 mg by mouth daily as needed for erectile dysfunction. (Patient not taking: Reported on 03/11/2024)     tamsulosin  (FLOMAX ) 0.4 MG CAPS capsule Take 0.4 mg by mouth in the morning and at bedtime.     Current Facility-Administered Medications  Medication Dose Route Frequency Provider Last Rate Last Admin   0.9 %  sodium chloride  infusion  500 mL Intravenous Once Rivers Gassmann C, MD        Allergies as of 03/24/2024   (No Known Allergies)    Family History  Problem Relation Age of Onset   Lung cancer Mother    Hypertension Mother    Diabetes Mother    Hyperlipidemia Mother    Emphysema Father    Heart failure Father    Melanoma Sister    Melanoma Paternal Grandmother    Heart attack Other    Colon cancer Neg Hx    Stroke Neg Hx    Esophageal cancer Neg Hx  Liver cancer Neg Hx    Pancreatic cancer Neg Hx    Stomach cancer Neg Hx    Rectal cancer Neg Hx      Social History   Socioeconomic History   Marital status: Married    Spouse name: Not on file   Number of children: 2   Years of education: Not on file   Highest education level: Not on file  Occupational History   Occupation: chief executive officer    Employer: [employer]  Tobacco Use   Smoking status: Never   Smokeless tobacco: Former    Types: Chew    Quit date: 02/03/2012  Vaping Use   Vaping status: Never Used  Substance and Sexual Activity   Alcohol use: No    Comment: past drinker 2-3 daily and sometimes binge drinks on the weekend   Drug use: No   Sexual activity: Yes  Other Topics Concern   Not on file  Social History Narrative   Not on file   Social Drivers of Health   Tobacco Use: Medium Risk (03/24/2024)   Patient History    Smoking Tobacco Use: Never    Smokeless Tobacco Use: Former    Passive Exposure: Not on Actuary Strain: Low Risk (06/04/2022)   Overall Financial Resource Strain (CARDIA)    Difficulty of Paying Living Expenses: Not hard at all  Food Insecurity: No Food Insecurity (02/19/2024)   Epic    Worried About Radiation Protection Practitioner of Food in the Last Year: Never true    Ran Out of Food in the Last Year: Never true  Transportation Needs: No Transportation Needs (02/19/2024)   Epic    Lack of Transportation (Medical): No    Lack of Transportation (Non-Medical): No  Physical Activity: Inactive (02/19/2024)   Exercise Vital Sign    Days of Exercise per Week: 0 days    Minutes of Exercise per Session: 0 min  Stress: No Stress Concern Present (02/19/2024)   [employer] of Occupational Health - Occupational Stress Questionnaire    Feeling of Stress: Not at all  Social Connections: Socially Integrated (02/19/2024)   Social Connection and Isolation Panel    Frequency of Communication with Friends and Family: More than three times a week    Frequency of Social Gatherings with Friends and Family: More than three  times a week    Attends Religious Services: More than 4 times per year    Active Member of Clubs or Organizations: Yes    Attends Banker Meetings: More than 4 times per year    Marital Status: Married  Catering Manager Violence: Not At Risk (02/19/2024)   Epic    Fear of Current or Ex-Partner: No    Emotionally Abused: No    Physically Abused: No    Sexually Abused: No  Depression (PHQ2-9): Low Risk (02/19/2024)   Depression (PHQ2-9)    PHQ-2 Score: 0  Alcohol Screen: Low Risk (06/04/2022)   Alcohol Screen    Last Alcohol Screening Score (AUDIT): 1  Housing: Unknown (02/19/2024)   Epic    Unable to Pay for Housing in the Last Year: No    Number of Times Moved in the Last Year: Not on file    Homeless in the Last Year: No  Utilities: Not At Risk (02/19/2024)   Epic    Threatened with loss of utilities: No  Health Literacy: Adequate Health Literacy (02/19/2024)   B1300 Health Literacy    Frequency of need for  help with medical instructions: Never    Physical Exam: Vital signs in last 24 hours: BP 133/76   Pulse 76   Temp 99.1 F (37.3 C) (Temporal)   Ht 5' 6 (1.676 m)   Wt 203 lb (92.1 kg)   SpO2 97%   BMI 32.77 kg/m  GEN: NAD EYE: Sclerae anicteric ENT: MMM CV: Non-tachycardic Pulm: No increased work of breathing GI: Soft, NT/ND NEURO:  Alert & Oriented   Estefana Kidney, MD [facility]  03/24/2024 9:41 AM  "

## 2024-03-24 NOTE — Op Note (Signed)
 [facility] Patient Name: Jose [name] Procedure Date: 03/24/2024 10:15 AM MRN: [id number] Endoscopist: Rosario Estefana Kidney , , [id number] Age: 74 Referring MD:  Date of Birth: 06-26-1950 Gender: Male Account #: [id number] Procedure:                Colonoscopy Indications:              High risk colon cancer surveillance: Personal                            history of colonic polyps Medicines:                Monitored Anesthesia Care Procedure:                Pre-Anesthesia Assessment:                           - Prior to the procedure, a History and Physical                            was performed, and patient medications and                            allergies were reviewed. The patient's tolerance of                            previous anesthesia was also reviewed. The risks                            and benefits of the procedure and the sedation                            options and risks were discussed with the patient.                            All questions were answered, and informed consent                            was obtained. Prior Anticoagulants: The patient has                            taken no anticoagulant or antiplatelet agents. ASA                            Grade Assessment: III - A patient with severe                            systemic disease. After reviewing the risks and                            benefits, the patient was deemed in satisfactory                            condition to undergo the procedure.  After obtaining informed consent, the colonoscope                            was passed under direct vision. Throughout the                            procedure, the patient's blood pressure, pulse, and                            oxygen saturations were monitored continuously. The                            Olympus Scope [id number] was introduced through the                            anus and advanced to the the  terminal ileum. The                            colonoscopy was performed without difficulty. The                            patient tolerated the procedure well. The quality                            of the bowel preparation was excellent. The                            terminal ileum, ileocecal valve, appendiceal                            orifice, and rectum were photographed. Scope In: 10:48:58 AM Scope Out: 11:01:00 AM Scope Withdrawal Time: 0 hours 7 minutes 34 seconds  Total Procedure Duration: 0 hours 12 minutes 2 seconds  Findings:                 The terminal ileum appeared normal.                           A 5 mm polyp was found in the transverse colon. The                            polyp was sessile. The polyp was removed with a                            cold snare. Resection and retrieval were complete.                           Multiple diverticula were found in the sigmoid                            colon, descending colon and ascending colon.                           Non-bleeding internal hemorrhoids were found during  retroflexion. Complications:            No immediate complications. Estimated Blood Loss:     Estimated blood loss was minimal. Impression:               - The examined portion of the ileum was normal.                           - One 5 mm polyp in the transverse colon, removed                            with a cold snare. Resected and retrieved.                           - Diverticulosis in the sigmoid colon, in the                            descending colon and in the ascending colon.                           - Non-bleeding internal hemorrhoids. Recommendation:           - Discharge patient to home (with escort).                           - Await pathology results.                           - The findings and recommendations were discussed                            with the patient. Dr Estefana Federico Rosario Estefana Federico,   03/24/2024 11:04:28 AM

## 2024-03-24 NOTE — Patient Instructions (Addendum)
-  Await pathology results -Handout on polyps, diverticulosis and hemorrhoids provided  YOU HAD AN ENDOSCOPIC PROCEDURE TODAY AT THE [facility]:   Refer to the procedure report that was given to you for any specific questions about what was found during the examination.  If the procedure report does not answer your questions, please call your gastroenterologist to clarify.  If you requested that your care partner not be given the details of your procedure findings, then the procedure report has been included in a sealed envelope for you to review at your convenience later.  YOU SHOULD EXPECT: Some feelings of bloating in the abdomen. Passage of more gas than usual.  Walking can help get rid of the air that was put into your GI tract during the procedure and reduce the bloating. If you had a lower endoscopy (such as a colonoscopy or flexible sigmoidoscopy) you may notice spotting of blood in your stool or on the toilet paper. If you underwent a bowel prep for your procedure, you may not have a normal bowel movement for a few days.  Please Note:  You might notice some irritation and congestion in your nose or some drainage.  This is from the oxygen used during your procedure.  There is no need for concern and it should clear up in a day or so.  SYMPTOMS TO REPORT IMMEDIATELY:  Following lower endoscopy (colonoscopy or flexible sigmoidoscopy):  Excessive amounts of blood in the stool  Significant tenderness or worsening of abdominal pains  Swelling of the abdomen that is new, acute  Fever of 100F or higher  For urgent or emergent issues, a gastroenterologist can be reached at any hour by calling [phone]. Do not use MyChart messaging for urgent concerns.    DIET:  We do recommend a small meal at first, but then you may proceed to your regular diet.  Drink plenty of fluids but you should avoid alcoholic beverages for 24 hours.  ACTIVITY:  You should plan to take it easy for the  rest of today and you should NOT DRIVE or use heavy machinery until tomorrow (because of the sedation medicines used during the test).    FOLLOW UP: Our staff will call the number listed on your records the next business day following your procedure.  We will call around 7:15- 8:00 am to check on you and address any questions or concerns that you may have regarding the information given to you following your procedure. If we do not reach you, we will leave a message.     If any biopsies were taken you will be contacted by phone or by letter within the next 1-3 weeks.  Please call us  at [phone] if you have not heard about the biopsies in 3 weeks.    SIGNATURES/CONFIDENTIALITY: You and/or your care partner have signed paperwork which will be entered into your electronic medical record.  These signatures attest to the fact that that the information above on your After Visit Summary has been reviewed and is understood.  Full responsibility of the confidentiality of this discharge information lies with you and/or your care-partner.

## 2024-03-24 NOTE — Progress Notes (Signed)
 Pt's states no medical or surgical changes since previsit or office visit.

## 2024-03-24 NOTE — Progress Notes (Signed)
 Sedate, gd SR, tolerated procedure well, VSS, report to RN

## 2024-03-25 ENCOUNTER — Telehealth: Payer: Self-pay

## 2024-03-25 NOTE — Telephone Encounter (Signed)
" °  Follow up Call-     03/24/2024    9:14 AM  Call back number  Post procedure Call Back phone  # [phone]  Permission to leave phone message Yes     Patient questions:  Do you have a fever, pain , or abdominal swelling? No. Pain Score  0 *  Have you tolerated food without any problems? Yes.    Have you been able to return to your normal activities? Yes.    Do you have any questions about your discharge instructions: Diet   No. Medications  No. Follow up visit  No.  Do you have questions or concerns about your Care? No.  Actions: * If pain score is 4 or above: No action needed, pain <4.   "

## 2024-03-26 ENCOUNTER — Ambulatory Visit: Payer: Self-pay | Admitting: Internal Medicine

## 2024-03-26 LAB — SURGICAL PATHOLOGY

## 2024-04-06 ENCOUNTER — Ambulatory Visit: Payer: Self-pay

## 2024-04-06 ENCOUNTER — Ambulatory Visit: Admitting: Internal Medicine

## 2024-04-06 NOTE — Telephone Encounter (Signed)
 Spoke with the patient and he states that he is having shortness of breath but no chest pain.  Patient is aware that he should call 911 if symptoms worsen.

## 2024-04-06 NOTE — Telephone Encounter (Signed)
 FYI Only or Action Required?: FYI only for provider: appointment scheduled on 2/4.  Patient was last seen in primary care on 01/02/2024 by Jose [name], [name], MD.  Called Nurse Triage reporting Shortness of Breath.  Symptoms began about a month ago.  Interventions attempted: Nothing.  Symptoms are: gradually worsening.  Triage Disposition: See HCP Within 4 Hours (Or PCP Triage)  Patient/caregiver understands and will follow disposition?: Yes      Reason for Disposition  [1] MILD difficulty breathing (e.g., minimal/no SOB at rest, SOB with walking, pulse < 100) AND [2] NEW-onset or WORSE than normal  Answer Assessment - Initial Assessment Questions This RN recommended pt be examined in next 4 hours, scheduled for 2/4 as earliest pt could make. Advised call back or seek immediate care if new or worsening symptoms.     1. RESPIRATORY STATUS: Describe your breathing? (e.g., wheezing, shortness of breath, unable to speak, severe coughing)      No problems sitting still By time get back to bed and lay down after  Can't lay on back because it feels like it's pushing - tightness or pressure when lay down but relieved by going on my side, confirms not at any other time  2. ONSET: When did this breathing problem begin?      1 month ago  3. PATTERN Does the difficult breathing come and go, or has it been constant since it started?      Comes and goes, with activity  5. RECURRENT SYMPTOM: Have you had difficulty breathing before? If Yes, ask: When was the last time? and What happened that time?      No  6. CARDIAC HISTORY: Do you have any history of heart disease? (e.g., heart attack, angina, bypass surgery, angioplasty)      Yes  7. LUNG HISTORY: Do you have any history of lung disease?  (e.g., pulmonary embolus, asthma, emphysema)     Denies  9. OTHER SYMPTOMS: Do you have any other symptoms? (e.g., chest pain, cough, dizziness, fever, runny nose)      Sinuses have been horrible this year and last year, they'll actually bleed Think with cold weather has affected sinuses quite a bit  Denies: Struggling to breathe Confusion/slurred speech Chest pain Stridor/wheezing  Protocols used: Breathing Difficulty-A-AH

## 2024-04-07 NOTE — Telephone Encounter (Signed)
 Spoke to the patient and offered a office visit today.  Patient declined.

## 2024-04-08 ENCOUNTER — Encounter: Payer: Self-pay | Admitting: Internal Medicine

## 2024-04-08 ENCOUNTER — Ambulatory Visit: Admitting: Internal Medicine

## 2024-04-08 VITALS — BP 119/68 | HR 68 | Temp 98.4°F | Wt 197.3 lb

## 2024-04-08 DIAGNOSIS — I2510 Atherosclerotic heart disease of native coronary artery without angina pectoris: Secondary | ICD-10-CM

## 2024-04-08 DIAGNOSIS — E785 Hyperlipidemia, unspecified: Secondary | ICD-10-CM

## 2024-04-08 DIAGNOSIS — R0789 Other chest pain: Secondary | ICD-10-CM

## 2024-04-08 DIAGNOSIS — R0609 Other forms of dyspnea: Secondary | ICD-10-CM

## 2024-04-08 DIAGNOSIS — E119 Type 2 diabetes mellitus without complications: Secondary | ICD-10-CM

## 2024-04-08 DIAGNOSIS — I10 Essential (primary) hypertension: Secondary | ICD-10-CM

## 2024-04-08 NOTE — Progress Notes (Signed)
 "    Established Patient Office Visit     CC/Reason for Visit: Dyspnea on exertion  HPI: Jose [name] is a 74 y.o. male who is coming in today for the above mentioned reasons. 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  For the last month he has been noticing increased dyspnea on exertion while walking to his mailbox and back which he estimates to be about 50 yards.  Also gets short of breath when going up stairs in his house.  He describes a sensation of chest fogginess, tightness but denies actual pain with exertion.  He also feels he gets a little dyspneic when lying down at nighttime.   Past Medical/Surgical History: Past Medical History:  Diagnosis Date   ALCOHOL ABUSE 07/27/2008   Allergy     ANXIETY 07/27/2008   Arthritis    Bifascicular block    beta blocker stopped due to profound bradycardia   CAD 07/27/2008   a. s/p Endeavor DES to CFX 07/2008; residual OM1 70%, EF 45%;  b. relook cath 5/10: patent stent in CFX;  c.  LHC (8/15):  prox LAD 30%, OM1 50% at bifurcation, OM1 sub-branch 50%, mid AVCFX stent patent, EF 55-65% - no change from 2010 >>> Med Rx (after abnormal nuc)    Cataract    Chronic kidney disease    2023   Diabetes mellitus without complication (HCC)    GERD 09/18/2006   History of TIA (transient ischemic attack) 07/2008   Hx of adenomatous colonic polyps    Hx of cardiovascular stress test    ETT-Myoview (09/2013):  Partially fixed defect with some reversibility - cannot r/o inf ischemia vs diaph atten, EF 51%; mod risk   Hyperlipidemia    HYPERTENSION 09/18/2006   LOW BACK PAIN 09/18/2006   Myocardial infarction (HCC) 2010   Numbness and tingling    finger tips at times   Rosacea 07/27/2008   SEIZURE DISORDER 09/18/2006   none since 2008    Squamous cell carcinoma of skin 07/19/2021   in situ - right nasal sidewall   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   Urinary tract infection start cipro  01-07-2012    Past Surgical History:  Procedure Laterality Date   CORONARY STENT PLACEMENT     drug eluting   GOLD SEED IMPLANT N/A 04/11/2021   Procedure: GOLD SEED IMPLANT SPACE OAR;  Surgeon: Elisabeth Valli JONETTA, MD;  Location: [facility];  Service: Urology;  Laterality: N/A;   HERNIA REPAIR     INGUINAL HERNIA REPAIR  01/14/2012   Procedure: LAPAROSCOPIC INGUINAL HERNIA;  Surgeon: Lynda Leos, MD;  Location: [facility];  Service: General;  Laterality: Right;   INSERTION OF MESH  01/14/2012   Procedure: INSERTION OF MESH;  Surgeon: Lynda Leos, MD;  Location: [facility];  Service: General;  Laterality: Right;   KNEE SURGERY Bilateral    BOTH KNEE'S REPLACED, DR. EMMIT, 2016 & 2020, PARTIAL LEFT. FULL RIGHT   LEFT HEART CATHETERIZATION WITH CORONARY ANGIOGRAM N/A 10/21/2013   Procedure: LEFT HEART CATHETERIZATION WITH CORONARY ANGIOGRAM;  Surgeon: Ozell JONETTA Fell, MD;  Location: [facility];  Service: Cardiovascular;  Laterality: N/A;   MEDIAL PARTIAL KNEE REPLACEMENT Left     Social History:  reports that he has never smoked. He quit smokeless tobacco use about 12 years ago.  His smokeless tobacco  use included chew. He reports that he does not drink alcohol and does not use drugs.  Allergies: Allergies[1]  Family History:  Family History  Problem Relation Age of Onset   Lung cancer Mother    Hypertension Mother    Diabetes Mother    Hyperlipidemia Mother    Emphysema Father    Heart failure Father    Melanoma Sister    Melanoma Paternal Grandmother    Heart attack Other    Colon cancer Neg Hx    Stroke Neg Hx    Esophageal cancer Neg Hx    Liver cancer Neg Hx    Pancreatic cancer Neg Hx    Stomach cancer Neg Hx    Rectal cancer Neg Hx     Current Medications[2]  Review of Systems:  Negative unless  indicated in HPI.   Physical Exam: Vitals:   04/08/24 1315 04/08/24 1410  BP: (!) 140/88 119/68  Pulse: 68   Temp: 98.4 F (36.9 C)   TempSrc: Oral   SpO2: 98%   Weight: 197 lb 4.8 oz (89.5 kg)     Body mass index is 31.85 kg/m.   Physical Exam Vitals reviewed.  Constitutional:      Appearance: Normal appearance.  HENT:     Head: Normocephalic and atraumatic.  Eyes:     Conjunctiva/sclera: Conjunctivae normal.  Cardiovascular:     Rate and Rhythm: Normal rate and regular rhythm.  Pulmonary:     Effort: Pulmonary effort is normal.     Breath sounds: Normal breath sounds.  Skin:    General: Skin is warm and dry.  Neurological:     General: No focal deficit present.     Mental Status: He is alert and oriented to person, place, and time.  Psychiatric:        Mood and Affect: Mood normal.        Behavior: Behavior normal.        Thought Content: Thought content normal.        Judgment: Judgment normal.      Impression and Plan:  DOE (dyspnea on exertion) -     EKG 12-Lead  Chest pressure -     EKG 12-Lead  Dyslipidemia  Essential hypertension  Type 2 diabetes mellitus without complication, without long-term current use of insulin  (HCC)  Atherosclerosis of native coronary artery of native heart without angina pectoris   - EKG done in office today shows normal sinus rhythm at a rate of 70, an old inferior infarct and a right bundle branch block with left bifascicular block that is not new.  No other acute ischemic changes. - Concerned that his chest tightness and dyspnea on exertion are an anginal equivalent.  Have advised urgent follow-up with cardiology and referral has been placed.  He last saw Dr. Wonda in 2024. - Blood pressure is relatively well-controlled, he keeps meticulous measurements. - Diabetes is well-controlled with his most recent A1c being 5.4. - His most recent LDL was 66 and he is maintained on a statin.  Time spent:32 minutes reviewing  chart, interviewing and examining patient and formulating plan of care.     Jose [name], MD [facility]     [1] No Known Allergies [2]  Current Outpatient Medications:    ALPRAZolam  (XANAX ) 0.5 MG tablet, Take 1 tablet (0.5 mg total) by mouth 3 (three) times daily as needed., Disp: 30 tablet, Rfl: 0   amLODipine  (NORVASC ) 5 MG tablet, Take 1 tablet (  5 mg total) by mouth daily., Disp: 90 tablet, Rfl: 1   Ascorbic Acid (VITAMIN C) 100 MG tablet, Take 100 mg by mouth daily., Disp: , Rfl:    aspirin  EC 81 MG tablet, Take 81 mg by mouth at bedtime., Disp: , Rfl:    atorvastatin  (LIPITOR) 20 MG tablet, TAKE 1 TABLET BY MOUTH EVERY DAY, Disp: 90 tablet, Rfl: 1   Azelaic Acid  15 % cream, APPLY 1 APPLICATION TOPICALLY AS DIRECTED. AFTER SKIN IS THOROUGHLY WASHED AND PATTED DRY, GENTLY BUT THOROUGHLY MASSAGE A THIN FILM OF AZELAIC ACID  INTO THE AFFECTED AREA TWICE DAILY, IN THE MORNING AND EVENING. APPLIED TO FACE., Disp: 50 g, Rfl: 0   bisoprolol  (ZEBETA ) 10 MG tablet, Take 1 tablet (10 mg total) by mouth daily., Disp: 90 tablet, Rfl: 3   Cholecalciferol (VITAMIN D3) 75 MCG (3000 UT) TABS, Take by mouth., Disp: , Rfl:    Continuous Glucose Sensor (FREESTYLE LIBRE 3 SENSOR) MISC, Place 1 sensor on the skin every 14 days. Use to check glucose continuously, Disp: 2 each, Rfl: 3   EPINEPHrine  0.3 mg/0.3 mL IJ SOAJ injection, Inject 0.3 mg into the muscle as needed for anaphylaxis., Disp: 1 each, Rfl: 0   glimepiride  (AMARYL ) 2 MG tablet, TAKE 1 TABLET BY MOUTH DAILY WITH BREAKFAST, Disp: 90 tablet, Rfl: 1   Lancets (ONETOUCH DELICA PLUS LANCET30G) MISC, USE AS DIRECTED ONCE DAILY, Disp: 100 each, Rfl: 0   Multiple Vitamins-Minerals (MULTIVITAMIN WITH MINERALS) tablet, Take 1 tablet by mouth at bedtime., Disp: , Rfl:    nitroGLYCERIN  (NITROSTAT ) 0.4 MG SL tablet, DISSOLVE 1 TABLET BY MOUTH UNDER THE TONGUE EVERY 5 MINUTES AS NEEDED FOR CHEST PAIN, Disp: 75 tablet, Rfl:  5   ONETOUCH VERIO test strip, USE TO TEST BLOOD SUGAR ONCE A DAY DX E11.51 -, Disp: 100 strip, Rfl: 3   pregabalin  (LYRICA ) 50 MG capsule, 3x daily(if no side effects). No refills:  Needs to see Dr Margaret or transfer prescription to PCP., Disp: 180 capsule, Rfl: 0   tamsulosin  (FLOMAX ) 0.4 MG CAPS capsule, Take 0.4 mg by mouth in the morning and at bedtime., Disp: , Rfl:    acetaminophen  (TYLENOL ) 500 MG tablet, Take 500-1,000 mg by mouth as needed for moderate pain. (Patient not taking: Reported on 04/08/2024), Disp: , Rfl:    melatonin 3 MG TABS tablet, Take 3 mg by mouth at bedtime. (Patient not taking: Reported on 04/08/2024), Disp: , Rfl:    oxybutynin (DITROPAN) 5 MG tablet, Take 5 mg by mouth at bedtime. (Patient not taking: Reported on 04/08/2024), Disp: , Rfl:    sildenafil (VIAGRA) 100 MG tablet, Take 100 mg by mouth daily as needed for erectile dysfunction. (Patient not taking: Reported on 04/08/2024), Disp: , Rfl:   Current Facility-Administered Medications:    0.9 %  sodium chloride  infusion, 500 mL, Intravenous, Once, Federico Rosario BROCKS, MD  "

## 2024-05-21 ENCOUNTER — Ambulatory Visit: Admitting: Cardiology

## 2025-02-24 ENCOUNTER — Ambulatory Visit
